# Patient Record
Sex: Female | Born: 1955 | Race: White | Hispanic: No | Smoking: Former smoker
Health system: Southern US, Community
[De-identification: ages and names within clinical notes are randomized; demographics above are authoritative.]

## PROBLEM LIST (undated history)

## (undated) DIAGNOSIS — J189 Pneumonia, unspecified organism: Secondary | ICD-10-CM

## (undated) DIAGNOSIS — K219 Gastro-esophageal reflux disease without esophagitis: Secondary | ICD-10-CM

## (undated) DIAGNOSIS — E782 Mixed hyperlipidemia: Secondary | ICD-10-CM

## (undated) DIAGNOSIS — I509 Heart failure, unspecified: Secondary | ICD-10-CM

## (undated) DIAGNOSIS — I10 Essential (primary) hypertension: Secondary | ICD-10-CM

## (undated) DIAGNOSIS — C801 Malignant (primary) neoplasm, unspecified: Secondary | ICD-10-CM

## (undated) DIAGNOSIS — I499 Cardiac arrhythmia, unspecified: Secondary | ICD-10-CM

## (undated) DIAGNOSIS — G709 Myoneural disorder, unspecified: Secondary | ICD-10-CM

## (undated) DIAGNOSIS — M1990 Unspecified osteoarthritis, unspecified site: Secondary | ICD-10-CM

## (undated) DIAGNOSIS — E119 Type 2 diabetes mellitus without complications: Secondary | ICD-10-CM

## (undated) DIAGNOSIS — F419 Anxiety disorder, unspecified: Secondary | ICD-10-CM

## (undated) HISTORY — PX: CHOLECYSTECTOMY: SHX55

## (undated) HISTORY — DX: Mixed hyperlipidemia: E78.2

## (undated) HISTORY — PX: SPLENECTOMY: SUR1306

## (undated) HISTORY — DX: Essential (primary) hypertension: I10

## (undated) HISTORY — DX: Gastro-esophageal reflux disease without esophagitis: K21.9

## (undated) HISTORY — DX: Malignant (primary) neoplasm, unspecified: C80.1

## (undated) HISTORY — PX: EYE SURGERY: SHX253

## (undated) HISTORY — PX: OTHER SURGICAL HISTORY: SHX169

---
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
## 1975-09-24 DIAGNOSIS — C801 Malignant (primary) neoplasm, unspecified: Secondary | ICD-10-CM

## 1975-09-24 HISTORY — DX: Malignant (primary) neoplasm, unspecified: C80.1

## 1975-09-24 HISTORY — PX: ABDOMINAL HYSTERECTOMY: SHX81

## 1997-10-28 ENCOUNTER — Ambulatory Visit (HOSPITAL_COMMUNITY): Admission: RE | Admit: 1997-10-28 | Discharge: 1997-10-28 | Payer: Self-pay | Admitting: Internal Medicine

## 1998-06-06 ENCOUNTER — Observation Stay (HOSPITAL_COMMUNITY): Admission: RE | Admit: 1998-06-06 | Discharge: 1998-06-07 | Payer: Self-pay | Admitting: Obstetrics and Gynecology

## 1998-06-19 ENCOUNTER — Encounter: Payer: Self-pay | Admitting: *Deleted

## 1998-06-20 ENCOUNTER — Inpatient Hospital Stay (HOSPITAL_COMMUNITY): Admission: EM | Admit: 1998-06-20 | Discharge: 1998-06-21 | Payer: Self-pay | Admitting: *Deleted

## 1998-08-31 ENCOUNTER — Other Ambulatory Visit: Admission: RE | Admit: 1998-08-31 | Discharge: 1998-08-31 | Payer: Self-pay | Admitting: Obstetrics and Gynecology

## 1999-06-21 ENCOUNTER — Encounter: Payer: Self-pay | Admitting: Gastroenterology

## 1999-06-21 ENCOUNTER — Ambulatory Visit (HOSPITAL_COMMUNITY): Admission: RE | Admit: 1999-06-21 | Discharge: 1999-06-21 | Payer: Self-pay | Admitting: Gastroenterology

## 1999-07-11 ENCOUNTER — Encounter: Payer: Self-pay | Admitting: Gastroenterology

## 1999-07-11 ENCOUNTER — Ambulatory Visit (HOSPITAL_COMMUNITY): Admission: RE | Admit: 1999-07-11 | Discharge: 1999-07-11 | Payer: Self-pay | Admitting: Gastroenterology

## 1999-07-19 ENCOUNTER — Ambulatory Visit (HOSPITAL_COMMUNITY): Admission: RE | Admit: 1999-07-19 | Discharge: 1999-07-19 | Payer: Self-pay | Admitting: Gastroenterology

## 1999-07-19 ENCOUNTER — Encounter: Payer: Self-pay | Admitting: Gastroenterology

## 1999-07-30 ENCOUNTER — Encounter: Payer: Self-pay | Admitting: Gastroenterology

## 1999-07-30 ENCOUNTER — Ambulatory Visit (HOSPITAL_COMMUNITY): Admission: RE | Admit: 1999-07-30 | Discharge: 1999-07-30 | Payer: Self-pay | Admitting: Gastroenterology

## 1999-08-13 ENCOUNTER — Encounter (INDEPENDENT_AMBULATORY_CARE_PROVIDER_SITE_OTHER): Payer: Self-pay

## 1999-08-13 ENCOUNTER — Ambulatory Visit (HOSPITAL_COMMUNITY): Admission: RE | Admit: 1999-08-13 | Discharge: 1999-08-13 | Payer: Self-pay | Admitting: Gastroenterology

## 2000-03-25 ENCOUNTER — Other Ambulatory Visit: Admission: RE | Admit: 2000-03-25 | Discharge: 2000-03-25 | Payer: Self-pay | Admitting: Obstetrics and Gynecology

## 2001-01-13 ENCOUNTER — Encounter: Admission: RE | Admit: 2001-01-13 | Discharge: 2001-04-13 | Payer: Self-pay | Admitting: Internal Medicine

## 2001-03-25 ENCOUNTER — Other Ambulatory Visit: Admission: RE | Admit: 2001-03-25 | Discharge: 2001-03-25 | Payer: Self-pay | Admitting: Obstetrics and Gynecology

## 2001-05-18 ENCOUNTER — Ambulatory Visit (HOSPITAL_COMMUNITY): Admission: RE | Admit: 2001-05-18 | Discharge: 2001-05-18 | Payer: Self-pay | Admitting: Internal Medicine

## 2001-05-18 ENCOUNTER — Encounter: Payer: Self-pay | Admitting: Internal Medicine

## 2001-08-04 ENCOUNTER — Encounter (INDEPENDENT_AMBULATORY_CARE_PROVIDER_SITE_OTHER): Payer: Self-pay

## 2001-08-04 ENCOUNTER — Observation Stay (HOSPITAL_COMMUNITY): Admission: RE | Admit: 2001-08-04 | Discharge: 2001-08-05 | Payer: Self-pay | Admitting: Obstetrics and Gynecology

## 2001-10-11 ENCOUNTER — Emergency Department (HOSPITAL_COMMUNITY): Admission: EM | Admit: 2001-10-11 | Discharge: 2001-10-11 | Payer: Self-pay | Admitting: Emergency Medicine

## 2002-07-14 ENCOUNTER — Encounter: Payer: Self-pay | Admitting: Internal Medicine

## 2002-07-14 ENCOUNTER — Ambulatory Visit (HOSPITAL_COMMUNITY): Admission: RE | Admit: 2002-07-14 | Discharge: 2002-07-14 | Payer: Self-pay | Admitting: Internal Medicine

## 2002-07-30 ENCOUNTER — Ambulatory Visit (HOSPITAL_COMMUNITY): Admission: RE | Admit: 2002-07-30 | Discharge: 2002-07-30 | Payer: Self-pay | Admitting: Internal Medicine

## 2002-07-30 ENCOUNTER — Encounter: Payer: Self-pay | Admitting: Internal Medicine

## 2002-12-26 ENCOUNTER — Ambulatory Visit (HOSPITAL_COMMUNITY): Admission: RE | Admit: 2002-12-26 | Discharge: 2002-12-26 | Payer: Self-pay | Admitting: Orthopaedic Surgery

## 2003-03-09 ENCOUNTER — Other Ambulatory Visit: Admission: RE | Admit: 2003-03-09 | Discharge: 2003-03-09 | Payer: Self-pay | Admitting: Obstetrics and Gynecology

## 2003-08-01 ENCOUNTER — Ambulatory Visit (HOSPITAL_COMMUNITY): Admission: RE | Admit: 2003-08-01 | Discharge: 2003-08-01 | Payer: Self-pay | Admitting: Internal Medicine

## 2003-10-06 ENCOUNTER — Inpatient Hospital Stay (HOSPITAL_COMMUNITY): Admission: EM | Admit: 2003-10-06 | Discharge: 2003-10-10 | Payer: Self-pay | Admitting: Emergency Medicine

## 2003-11-03 ENCOUNTER — Encounter: Admission: RE | Admit: 2003-11-03 | Discharge: 2004-02-01 | Payer: Self-pay | Admitting: Internal Medicine

## 2003-11-16 ENCOUNTER — Ambulatory Visit: Admission: RE | Admit: 2003-11-16 | Discharge: 2003-11-16 | Payer: Self-pay | Admitting: Internal Medicine

## 2004-02-28 ENCOUNTER — Encounter: Admission: RE | Admit: 2004-02-28 | Discharge: 2004-02-28 | Payer: Self-pay | Admitting: Gastroenterology

## 2004-09-07 ENCOUNTER — Ambulatory Visit (HOSPITAL_COMMUNITY): Admission: RE | Admit: 2004-09-07 | Discharge: 2004-09-07 | Payer: Self-pay | Admitting: Internal Medicine

## 2005-03-06 ENCOUNTER — Ambulatory Visit (HOSPITAL_COMMUNITY): Admission: RE | Admit: 2005-03-06 | Discharge: 2005-03-06 | Payer: Self-pay | Admitting: Orthopedic Surgery

## 2005-10-07 ENCOUNTER — Ambulatory Visit (HOSPITAL_COMMUNITY): Admission: RE | Admit: 2005-10-07 | Discharge: 2005-10-07 | Payer: Self-pay | Admitting: Internal Medicine

## 2005-11-15 ENCOUNTER — Ambulatory Visit (HOSPITAL_COMMUNITY): Admission: RE | Admit: 2005-11-15 | Discharge: 2005-11-15 | Payer: Self-pay | Admitting: Internal Medicine

## 2006-10-24 ENCOUNTER — Encounter: Admission: RE | Admit: 2006-10-24 | Discharge: 2006-10-24 | Payer: Self-pay | Admitting: Orthopedic Surgery

## 2007-04-30 ENCOUNTER — Ambulatory Visit (HOSPITAL_COMMUNITY): Admission: RE | Admit: 2007-04-30 | Discharge: 2007-04-30 | Payer: Self-pay | Admitting: Internal Medicine

## 2009-07-11 ENCOUNTER — Encounter: Admission: RE | Admit: 2009-07-11 | Discharge: 2009-07-11 | Payer: Self-pay | Admitting: Orthopedic Surgery

## 2010-05-31 ENCOUNTER — Emergency Department (HOSPITAL_COMMUNITY): Admission: EM | Admit: 2010-05-31 | Discharge: 2010-05-31 | Payer: Self-pay | Admitting: Emergency Medicine

## 2010-08-28 ENCOUNTER — Ambulatory Visit (HOSPITAL_COMMUNITY)
Admission: RE | Admit: 2010-08-28 | Discharge: 2010-08-28 | Payer: Self-pay | Source: Home / Self Care | Admitting: Internal Medicine

## 2010-09-06 ENCOUNTER — Encounter
Admission: RE | Admit: 2010-09-06 | Discharge: 2010-09-06 | Payer: Self-pay | Source: Home / Self Care | Attending: Internal Medicine | Admitting: Internal Medicine

## 2010-09-06 LAB — HM MAMMOGRAPHY: HM Mammogram: NEGATIVE

## 2010-10-14 ENCOUNTER — Encounter: Payer: Self-pay | Admitting: Gastroenterology

## 2010-10-15 ENCOUNTER — Encounter: Payer: Self-pay | Admitting: Internal Medicine

## 2010-12-06 LAB — DIFFERENTIAL
Basophils Absolute: 0.1 10*3/uL (ref 0.0–0.1)
Basophils Relative: 1 % (ref 0–1)
Eosinophils Absolute: 0.1 10*3/uL (ref 0.0–0.7)
Eosinophils Relative: 1 % (ref 0–5)
Lymphocytes Relative: 32 % (ref 12–46)
Lymphs Abs: 3 10*3/uL (ref 0.7–4.0)
Monocytes Absolute: 0.7 10*3/uL (ref 0.1–1.0)
Monocytes Relative: 7 % (ref 3–12)
Neutro Abs: 5.7 10*3/uL (ref 1.7–7.7)
Neutrophils Relative %: 60 % (ref 43–77)

## 2010-12-06 LAB — COMPREHENSIVE METABOLIC PANEL
ALT: 25 U/L (ref 0–35)
AST: 23 U/L (ref 0–37)
Albumin: 3.8 g/dL (ref 3.5–5.2)
Alkaline Phosphatase: 100 U/L (ref 39–117)
BUN: 13 mg/dL (ref 6–23)
CO2: 26 mEq/L (ref 19–32)
Calcium: 9.9 mg/dL (ref 8.4–10.5)
Chloride: 104 mEq/L (ref 96–112)
Creatinine, Ser: 0.57 mg/dL (ref 0.4–1.2)
GFR calc Af Amer: >60 mL/min (ref >60)
GFR calc non Af Amer: >60 mL/min (ref >60)
Glucose, Bld: 150 mg/dL — ABNORMAL HIGH (ref 70–99)
Potassium: 3.5 mEq/L (ref 3.5–5.1)
Sodium: 140 mEq/L (ref 135–145)
Total Bilirubin: 0.4 mg/dL (ref 0.3–1.2)
Total Protein: 6.8 g/dL (ref 6.0–8.3)

## 2010-12-06 LAB — URINALYSIS, ROUTINE W REFLEX MICROSCOPIC
Bilirubin Urine: NEGATIVE
Glucose, UA: NEGATIVE mg/dL
Hgb urine dipstick: NEGATIVE
Ketones, ur: NEGATIVE mg/dL
Nitrite: NEGATIVE
Protein, ur: NEGATIVE mg/dL
Specific Gravity, Urine: 1.009 (ref 1.005–1.030)
Urobilinogen, UA: 0.2 mg/dL (ref 0.0–1.0)
pH: 7.5 (ref 5.0–8.0)

## 2010-12-06 LAB — CBC
HCT: 41.6 % (ref 36.0–46.0)
Hemoglobin: 14 g/dL (ref 12.0–15.0)
MCH: 28.3 pg (ref 26.0–34.0)
MCHC: 33.7 g/dL (ref 30.0–36.0)
MCV: 84.2 fL (ref 78.0–100.0)
Platelets: 472 10*3/uL — ABNORMAL HIGH (ref 150–400)
RBC: 4.94 MIL/uL (ref 3.87–5.11)
RDW: 14.1 % (ref 11.5–15.5)
WBC: 9.6 10*3/uL (ref 4.0–10.5)

## 2010-12-06 LAB — PROTIME-INR
INR: 0.98 (ref 0.00–1.49)
Prothrombin Time: 13.2 seconds (ref 11.6–15.2)

## 2011-02-08 NOTE — Op Note (Signed)
[facility]  Patient:    [name], [name] Visit Number: [id number] MRN: [id number]          Service Type: DSU Location: [id number] Attending Physician:  Jenean Lindau Dictated by:   Laqueta Linden, M.D. Admit Date:  08/04/2001   CC:         Santiago Bumpers, M.D.   Operative Report  PREOPERATIVE DIAGNOSES:       1. Left ovarian cyst.                               2. Dyspareunia.                               3. History of endometriosis in the past.  POSTOPERATIVE DIAGNOSES:      1. Left ovarian cyst.                               2. Dyspareunia.                               3. History of endometriosis in the past.  PROCEDURE:                    Laparoscopic left salpingo-oophorectomy with lysis of adhesions.  SURGEON:                      Laqueta Linden, M.D.  ASSISTANT:                    Edwena Felty. Ashley Royalty, M.D.  ANESTHESIA:                   General endotracheal.  ESTIMATED BLOOD LOSS:         Less than 10 cc.  COUNTS:                       Correct x 2.  COMPLICATIONS:                None.  INDICATIONS:                  [name] is a 55 year old menopausal female who has undergone a vaginal hysterectomy and right salpingo-oophorectomy in the past who presented with recurrent dyspareunia and a left ovarian cyst on ultrasound.  She had a prior history of identical symptoms prior to her right salpingo-oophorectomy and was noted to have an endometriotic implant and an adhesed right ovary to the vaginal cuff at the time of that surgery in 1999.  She had remained pain free until the past six months when she had recurrent dyspareunia with identical symptoms.  She was noted on pelvic ultrasound to have a 1.5 cm echo free cyst of the left ovary with an area of slight increased echogenicity of unclear etiology.  It was unclear whether this ovary was adherent to the vaginal cuff or not.  The patient has seen the informed consent  film and understands that she may require laparotomy due to her multiple prior surgeries and the risk of adhesions.  Has undergone a mechanical bowel prep and voices her understanding that there might be some hormonal changes with removal of her remaining ovary requiring  adjustment in her menopausal estrogen.  She has been extensively counseled as to the risks, benefits, alternatives, and complications, particularly with respect to her multiple prior surgeries and agrees to proceed.  She will be hospitalized for overnight observation postoperatively for pain and nausea management per her request.  PROCEDURE:                    The patient was taken to the operating room and after proper identification and consents were ascertained, she was placed on the operating table in a supine position.  After the induction of general endotracheal anesthesia she was placed in the [place] stirrups and the abdomen, perineum, and vagina were prepped and draped in a routine sterile fashion.  A transurethral Foley was placed which was removed at the conclusion of the procedure.  A sponge stick was placed in the vagina for manipulation of the vaginal cuff.  Attention was then turned abdominally.  A 2 cm infraumbilical incision was made.  The curved Kelly clamps were then used to free up the underlying scar tissue and identify the fascia which was sharply incised.  The peritoneum was then identified and elevated with sharp entry into the peritoneal cavity.  Palpation at the insertion site revealed no adhesions or abnormalities at the insertion site.  To the left of the peritoneal opening there was noted to be a thick band of omental adhesions.  The Hasson cannula was then placed after sutures were placed in the fascia to hold it in place. Pneumoperitoneum was established.  The patient was placed in Trendelenburg. Then 5 mm ports were placed in both lower quadrants under direct vision. Inspection of the upper  abdomen was limited due to adhesions.  The gallbladder was surgically absent.  There was a thick band of omental adhesions in the left upper and mid quadrant which were not disturbed.  There were additional omental adhesions to the anterior peritoneal wall in the left lower quadrant and into the pelvis which created an apron of omentum which made it impossible to see into the pelvis.  The tripolar cautery was then used to cauterize and transect these adhesions with marked improvement of visibility.  There was no bowel contained within these adhesions and visibility was good during that dissection.  At this point the bowel was mobilized in the upper abdomen such that the pelvis could be better visualized.  The vaginal cuff was elevated and free of any lesions, nodules, or evidence of endometriosis.  The cul-de-sac was cleaned.  The right tube and ovary were surgically absent.  The left tube was fairly mobile as was the ovary which had a 2 cm clear simple appearing cyst.  There were some filmy peritoneal and omental adhesions just at the level of the infundibulopelvic ligament which were gently pulled away with improved mobilization.  The tripolar cautery was used to cauterize and transect the round ligament of the left with marked improvement in mobility of that adnexa.  The ureter was visualized deep in the pelvis and well out of the operative field.  Vicryl 3-0 endo loops were placed across the infundibulopelvic ligament on the left.  The left adnexa was then transected from this.  Hemostasis was excellent.  The tube and ovary were then cut into smaller pieces as necessary to be removed through the primary trocar site and were sent to pathology for final sectioning.  Copious lavage was accomplished. The filmy left lower quadrant adhesions were lysed with freeing up of the adherent  bowel and omentum to the anterior abdominal wall in the left lower quadrant.  Additional dissection of the  thicker adhesions filling the left mid  and upper quadrant was not attempted due to an inability to visualize the extent of the adhesions as well as the patients history of prior extensive surgery with splenectomy and pancreatectomy.  At this point the procedure was terminated.  The 5 mm ports were removed with no active bleeding.  The pneumoperitoneum was allowed to escape.  Inspection as the scope was withdrawn revealed no obvious injury or bleeding at the insertion site.  The fascia at the insertion site was closed with 0 Vicryl sutures.  All skin incisions were then closed with subcuticular stitch of 4-0 Vicryl.  Steri-Strips and pressure dressings were applied.  The Foley catheter and vaginal sponge were then removed.  The patient was awakened and extubated on transfer to the recovery room.  Per her request she will be placed on overnight observation with anticipated discharge in the morning. Dictated by:   Laqueta Linden, M.D. Attending Physician:  Jenean Lindau DD:  08/04/01 TD:  08/04/01 Job: [id number]

## 2011-02-08 NOTE — H&P (Signed)
[name], [name]                      ACCOUNT NO.:  [id number]   MEDICAL RECORD NO.:  [id number]                   PATIENT TYPE:  INP   LOCATION:  1824                                 FACILITY:  [facility]   PHYSICIAN:  Vania Rea, M.D.              DATE OF BIRTH:  1956-08-01   DATE OF ADMISSION:  10/06/2003  DATE OF DISCHARGE:                                HISTORY & PHYSICAL   PRIMARY CARE PHYSICIAN:  Dr. Karleen Hampshire.   CHIEF COMPLAINT:  Dizziness, difficulty seeing, weakness, and elevated blood  sugar.   HISTORY OF PRESENT ILLNESS:  This is a 55 year old Caucasian lady, status  post partial pancreatectomy with splenectomy in 2000, as a result of  recurrent chronic pancreatitis, who was in fairly good health until about  two weeks ago when she developed a cough productive of yellow-green bloody  sputum associated with nausea, vomiting, and diarrhea and has been treated  by her primary care physician, first with five days of Augmentin and cough  medicine, which caused some improvement.  It was later changed to [facility] and  another cough medicine, and later was apparently treated for bronchitis with  prednisone and inhalers.  The patient has a history of using inhalers in the  past.  Patient has spent most of the past two weeks in bed.  Yesterday, the  patient felt well enough that she thought she would return to work, but when  she got up to walk, she felt weak.  Her legs felt wobbly and dizzy, and she  had difficulty seeing.  She went to her primary care physician, who did  blood tests and found her to have very elevated blood sugar and sent her in  for admission.   Patient notes that a day or so after the onset of her acute upper  respiratory illness two weeks ago, she started having excessive thirst and  passing excessive amounts of urine.  The patient denies fevers, chest pain,  palpitations, or syncope.  For the past two weeks, she has noted that she  has been  having orthopnea and PND, which are new.  She has recurrent  episodes of leg edema, for which she says she has been treated with  furosemide 40 mg twice daily.  She gives no history of a cardiac evaluation.   PAST MEDICAL HISTORY:  1. History of chronic pancreatitis, as noted above, now resolved.  2. GERD.  3. Remote of CA of the cervix.   PAST SURGICAL HISTORY:  1. Status post partial pancreatectomy.  2. Status post splenectomy in 2000.  3. Status post hysterectomy at age 60 for CA of the cervix.   MEDICATIONS:  Within the past couple of weeks, she has been taking  Augmentin, Histinex, Ketek, and more recently, prednisone 3 times daily,  dose unknown.  Chronically, she uses Lipitor 40 mg, aspirin 81 mg, Cenestin  0.9 mg, and furosemide 40 mg twice daily.  ALLERGIES:  As a child, penicillin gave her hives, but she recently took  Augmentin for 5 days without any obvious difficulty.   SOCIAL HISTORY:  She smoked 1-1/2 packs of cigarettes per day between ages  88 and 17.  Used to be an occasional drinker of alcohol but discontinued  after episodes of recurrent pancreatitis.  Very remote history of marijuana  use.   Both her mother and father had CABG twice.  Mother also has hypertension.  Father has diabetes and glaucoma.  She works as a [employer] operational officer.  Has two  children.  Son died at the age of 55 as a result of an accident.  Her  daughter is now 12 and has no significant medical problems.   REVIEW OF SYSTEMS:  Otherwise noncontributory.  Specifically, no history  suggestive of sinusitis.  No history suggestive of UTI.  No history  suggestive of ear infections.   PHYSICAL EXAMINATION:  VITAL SIGNS:  Temperature 98.4, pulse 76, respiration  20, blood pressure 137/89.  GENERAL:  This is a pleasant, middle-aged lady lying on the stretcher in no  obvious distress.  HEENT:  She is pink, anicteric.  Pupils are equal and reactive.  NECK:  No lymphadenopathy.  LUNGS:  Clear to  auscultation bilaterally.  HEART:  Regular rhythm.  No murmurs, rubs or gallops.  ABDOMEN:  Obese.  Soft, nontender.  There is a midline scar from her  surgeries.  EXTREMITIES:  Trace edema, 3+ pulses.  NEUROLOGIC:  Alert and oriented x 3.  No focal deficits.   LABS:  Hematocrit is 46, hemoglobin 15.6.  Sodium 130, potassium 4, chloride  100, BUN 24, creatinine 0.7, pH 7.44, pCO2 35.  Cardiac enzymes are  completely normal, so far.  Urinalysis shows specific gravity of 1.036, pH  6.5, glucose over 1000, ketones 15, protein none.  Nitrites and leukocyte  esterase none.  Her amylase and lipase are well within normal limits.   Chest x-ray is reported as normal.   ASSESSMENT:  This is a lady status post partial pancreatectomy with what  sounds like an acute, probably bronchitis or sinusitis, who was tipped over  into an overt manifestation of diabetes.  She is also quite dehydrated.   PLAN:  Hydrate her.  Start her on an insulin regimen, Novolin 70/30 with  sliding scale coverage.  Get her some diabetic education.  If she becomes  febrile, we will culture her, but at the moment, she seems to be, whatever  infection she had is partially treated, and she is showing no evidence of  infection.  She actually came with some old labs which showed a white count  of 17,000.  We will repeat her CBC and see what this is now.  We will give  her Levaquin empirically.                                                Vania Rea, M.D.    LC/MEDQ  D:  10/06/2003  T:  10/06/2003  Job:  [id number]

## 2011-02-08 NOTE — Discharge Summary (Signed)
[name], [name]                      ACCOUNT NO.:  [id number]   MEDICAL RECORD NO.:  [id number]                   PATIENT TYPE:  INP   LOCATION:  5506                                 FACILITY:  [facility]   PHYSICIAN:  Vania Rea, M.D.              DATE OF BIRTH:  Aug 15, 1956   DATE OF ADMISSION:  10/06/2003  DATE OF DISCHARGE:  10/10/2003                                 DISCHARGE SUMMARY   PRIMARY CARE PHYSICIAN:  Dr. Lucky Cowboy.   FINAL DIAGNOSES:  1. Diabetes mellitus, type 2.  2. Anxiety disorder.  3. History of alcohol abuse secondary to death of a child.  4. Pancreatic pseudocyst.  5. Status post partial pancreatectomy and splenectomy in 2000 at [place]     [facility] [place].  6. Status post partial hysterectomy for cervical carcinoma.   FINAL PROCEDURES:  Chest x-ray performed October 06, 2003 showing no active  disease.   <PERTINENT LABORATORIES AND OTHER TEST RESULTS/>  White blood cells 13.3, hemoglobin and hematocrit 12.4/37.7 with a platelet  count of 309,000.  Sodium 138, potassium 4.3, chloride 104, carbon dioxide  28, BUN 12, creatinine 0.7, glucose 157, total bilirubin 0.6, direct  bilirubin of less than 0.1, alkaline phosphatase 176, AST 18, ALT is 23,  total protein 6.1, albumin 3.3, calcium 8.5, magnesium 1.9, amylase 48.  Hemoglobin A1c 11.  Lipase 26.  Cardiac enzymes negative.  Total cholesterol  184, triglycerides 291, HDL 41, LDL 85, VLDL 58.  TSH 1.596.  Urinary  creatinine 78.1, spot urine protein 12, microalbumin 0.58, pancreatic islet  cell antibodies less than 1:4.   SUMMARY OF HOSPITAL COURSE BY SYSTEMS:  The patient is a pleasant 55-year-  old white female with past medical history as above, who was admitted  through the emergency room when she had several days' worth of nausea,  vomiting, generalized weakness and was also found to have a blood sugar of  greater than 600.   PROBLEM #1 - NEUROPSYCHIATRIC:  There were no signs  or symptoms of  meningitis, encephalitis or CVA.  There were no in-hospital episodes of  acute mania or psychosis, however, the patient did appear to be markedly  anxious regarding her new diagnosis, frequently asking to stay longer so  that she could get adjusted to her new condition.  The patient, at night,  would ask for medications to help her calm her nerves, at times  particularly asking for benzodiazepines.  This was give on a x1 basis only  and the patient was started buspirone, which she appeared to be tolerating  by the time of discharge.  The patient was frequently reassured by the  nursing staff as well as by physicians as to the chronicity versus acuity of  her condition and the excellent outcome if lifestyle modifications and  medical compliance were adhered to.   PROBLEM #2 - PULMONARY:  No active issues.   PROBLEM #3 - CARDIOVASCULAR:  The patient was continued on her aspirin and  atorvastatin.  She was to be started on low-dose ACE inhibitor, however, the  patient did have rather low blood pressures while in house in the absence of  any blood pressure medications, reaching a low of 98/63, and for this  reason, ACE inhibitors were not started as an inpatient.   PROBLEM #4 - RENAL:  The patient's urine protein/creatinine ratio was  calculated at 0.15, showing a very minimal proteinuria with an elevated  microalbumin, however, for the reasons listed above, she was not started on  an ACE inhibitor.   PROBLEM #5 - GASTROINTESTINAL:  There were no active issues.   PROBLEM #6 - FLUIDS, ELECTROLYTES, AND NUTRITION:  The patient was fluid-  resuscitated in regards to the osmotic diuresis from prolonged hyperglycemia  and she tolerated this quite well.  Her electrolytes were kept within normal  limits and she was started on a 2200-kilocalorie ADA diet while in house.   PROBLEM #7 - INFECTIOUS DISEASE:  The patient did notice to have a white  count and she had been on a  prolonged course of antibiotics for presumed  community-acquired pneumonia.  This was continued with in-house  moxifloxacin, which she will continue on a 4-day schedule for another 4  days.  There were no febrile episodes while the patient was admitted.   PROBLEM #8 - HEMATOLOGY/ONCOLOGY:  No active issues.   PROBLEM #9 - ENDOCRINE:  TSH was normal.  The patient is receiving the new  diagnosis of type 2 diabetes, although in review of old records from [facility],  the patient had stated that she was borderline diabetic approximately 3  years ago.  Likely, this is contributed in part to by her partial  pancreatectomy.  She was started on metformin and glipizide XL, which she  tolerated quite well.  She received NPH while in house and sliding-scale  insulin and was taught and reinforced many times on how to check her blood  sugars as well as administer subcutaneous injections of insulin.  This is a  new diagnosis and the patient was uncomfortable starting insulin.  Lifestyle  modifications such as diet and exercise were stressed, as well as compliance  with her medical regimen, and she will attempt to lose weight, watch her  diet and take her medications in an attempt to avoid insulin therapy.   PROBLEM #10 - PROPHYLAXIS:  The patient was full p.o. for GI prophylaxis and  was ambulatory for DVT prophylaxis.   PROBLEM #11 - DISPOSITION:  The patient is being discharged home in much  improved condition.   DISCHARGE MEDICATIONS:  1. Aspirin 81 mg p.o. daily.  2. Atorvastatin 40 mg p.o. daily.  3. Buspirone 7.5 mg p.o. b.i.d. (new medication).  4. Glipizide XL 5 mg p.o. daily (new medication).  5. Metformin 1000 mg p.o. b.i.d. (new medication).  6. Moxifloxacin 400 mg p.o. daily x4 days (new medication).   DISCHARGE INSTRUCTIONS:  1. The patient is to take her medication as prescribed.  2. She is to have a low-fat, low-salt, low-concentrated-sweet-and-sugar     diet. 3. She is to engage in a  regular daily low-impact aerobic activity.  4. She is to follow up with Dr. Oneta Rack in 2 to 4 weeks.  5. She is to return if she feels worse.      Ara D. Tammi Klippel, M.D.  Vania Rea, M.D.    ADM/MEDQ  D:  10/10/2003  T:  10/11/2003  Job:  [id number]

## 2011-12-07 ENCOUNTER — Inpatient Hospital Stay (HOSPITAL_COMMUNITY)
Admission: EM | Admit: 2011-12-07 | Discharge: 2011-12-10 | DRG: 294 | Disposition: A | Discharge status: Home / Self Care | Payer: BC Managed Care – PPO | Attending: Internal Medicine | Admitting: Internal Medicine

## 2011-12-07 ENCOUNTER — Inpatient Hospital Stay (HOSPITAL_COMMUNITY): Payer: BC Managed Care – PPO

## 2011-12-07 ENCOUNTER — Other Ambulatory Visit: Payer: Self-pay

## 2011-12-07 ENCOUNTER — Encounter (HOSPITAL_COMMUNITY): Payer: Self-pay

## 2011-12-07 DIAGNOSIS — M109 Gout, unspecified: Secondary | ICD-10-CM | POA: Diagnosis present

## 2011-12-07 DIAGNOSIS — E876 Hypokalemia: Secondary | ICD-10-CM | POA: Diagnosis present

## 2011-12-07 DIAGNOSIS — E1110 Type 2 diabetes mellitus with ketoacidosis without coma: Secondary | ICD-10-CM

## 2011-12-07 DIAGNOSIS — R112 Nausea with vomiting, unspecified: Secondary | ICD-10-CM | POA: Diagnosis present

## 2011-12-07 DIAGNOSIS — K219 Gastro-esophageal reflux disease without esophagitis: Secondary | ICD-10-CM | POA: Diagnosis present

## 2011-12-07 DIAGNOSIS — Z9081 Acquired absence of spleen: Secondary | ICD-10-CM

## 2011-12-07 DIAGNOSIS — Z7982 Long term (current) use of aspirin: Secondary | ICD-10-CM

## 2011-12-07 DIAGNOSIS — E1010 Type 1 diabetes mellitus with ketoacidosis without coma: Principal | ICD-10-CM | POA: Diagnosis present

## 2011-12-07 DIAGNOSIS — Z794 Long term (current) use of insulin: Secondary | ICD-10-CM

## 2011-12-07 LAB — CBC
HCT: 49.1 % — ABNORMAL HIGH (ref 36.0–46.0)
HCT: 46.5 % — ABNORMAL HIGH (ref 36.0–46.0)
Hemoglobin: 16.8 g/dL — ABNORMAL HIGH (ref 12.0–15.0)
Hemoglobin: 16.7 g/dL — ABNORMAL HIGH (ref 12.0–15.0)
MCH: 27.7 pg (ref 26.0–34.0)
MCHC: 35.9 g/dL (ref 30.0–36.0)
MCV: 78.6 fL (ref 78.0–100.0)
MCV: 77.1 fL — ABNORMAL LOW (ref 78.0–100.0)
RDW: 15.3 % (ref 11.5–15.5)
WBC: 13.2 10*3/uL — ABNORMAL HIGH (ref 4.0–10.5)

## 2011-12-07 LAB — BASIC METABOLIC PANEL
BUN: 29 mg/dL — ABNORMAL HIGH (ref 6–23)
BUN: 26 mg/dL — ABNORMAL HIGH (ref 6–23)
BUN: 25 mg/dL — ABNORMAL HIGH (ref 6–23)
CO2: 11 mEq/L — ABNORMAL LOW (ref 19–32)
CO2: 18 mEq/L — ABNORMAL LOW (ref 19–32)
CO2: 20 mEq/L (ref 19–32)
Calcium: 10.1 mg/dL (ref 8.4–10.5)
Calcium: 9.4 mg/dL (ref 8.4–10.5)
Chloride: 95 mEq/L — ABNORMAL LOW (ref 96–112)
Chloride: 99 mEq/L (ref 96–112)
Chloride: 103 mEq/L (ref 96–112)
Creatinine, Ser: 0.6 mg/dL (ref 0.50–1.10)
Creatinine, Ser: 0.51 mg/dL (ref 0.50–1.10)
Creatinine, Ser: 0.52 mg/dL (ref 0.50–1.10)
Glucose, Bld: 563 mg/dL (ref 70–99)
Glucose, Bld: 341 mg/dL — ABNORMAL HIGH (ref 70–99)
Glucose, Bld: 215 mg/dL — ABNORMAL HIGH (ref 70–99)
Glucose, Bld: 133 mg/dL — ABNORMAL HIGH (ref 70–99)
Potassium: 3.3 mEq/L — ABNORMAL LOW (ref 3.5–5.1)

## 2011-12-07 LAB — URINALYSIS, ROUTINE W REFLEX MICROSCOPIC
Bilirubin Urine: NEGATIVE
Glucose, UA: >1000 mg/dL — AB
Ketones, ur: >80 mg/dL — AB
Leukocytes, UA: NEGATIVE
Protein, ur: 100 mg/dL — AB
pH: 6 (ref 5.0–8.0)

## 2011-12-07 LAB — URINE MICROSCOPIC-ADD ON

## 2011-12-07 LAB — DIFFERENTIAL
Eosinophils Relative: 0 % (ref 0–5)
Lymphocytes Relative: 18 % (ref 12–46)
Monocytes Absolute: 0.9 10*3/uL (ref 0.1–1.0)
Monocytes Relative: 7 % (ref 3–12)
Neutro Abs: 10 10*3/uL — ABNORMAL HIGH (ref 1.7–7.7)

## 2011-12-07 LAB — GLUCOSE, CAPILLARY
Glucose-Capillary: 460 mg/dL — ABNORMAL HIGH (ref 70–99)
Glucose-Capillary: 149 mg/dL — ABNORMAL HIGH (ref 70–99)
Glucose-Capillary: 134 mg/dL — ABNORMAL HIGH (ref 70–99)
Glucose-Capillary: 136 mg/dL — ABNORMAL HIGH (ref 70–99)

## 2011-12-07 LAB — POCT I-STAT 3, VENOUS BLOOD GAS (G3P V)
O2 Saturation: 27 %
pCO2, Ven: 32 mmHg — ABNORMAL LOW (ref 45.0–50.0)

## 2011-12-07 MED ORDER — SODIUM CHLORIDE 0.9 % IV SOLN: INTRAVENOUS | Status: DC

## 2011-12-07 MED ORDER — MORPHINE SULFATE 4 MG/ML IJ SOLN
4.0000 mg | Freq: Once | INTRAMUSCULAR | Status: AC
  Administered 2011-12-07: 4 mg via INTRAVENOUS
  Filled 2011-12-07: qty 1

## 2011-12-07 MED ORDER — DEXTROSE 50 % IV SOLN: 25.0000 mL | INTRAVENOUS | Status: DC | PRN

## 2011-12-07 MED ORDER — SODIUM CHLORIDE 0.9 % IV SOLN
INTRAVENOUS | Status: DC
  Administered 2011-12-07: 18:00:00 via INTRAVENOUS

## 2011-12-07 MED ORDER — ONDANSETRON HCL 4 MG/2ML IJ SOLN
4.0000 mg | Freq: Once | INTRAMUSCULAR | Status: AC
  Administered 2011-12-07: 4 mg via INTRAVENOUS
  Filled 2011-12-07: qty 2

## 2011-12-07 MED ORDER — DEXTROSE-NACL 5-0.45 % IV SOLN
INTRAVENOUS | Status: DC
  Administered 2011-12-07: 18:00:00 via INTRAVENOUS

## 2011-12-07 MED ORDER — SODIUM CHLORIDE 0.9 % IV SOLN
INTRAVENOUS | Status: DC
  Administered 2011-12-07: 5 [IU]/h via INTRAVENOUS
  Filled 2011-12-07: qty 1

## 2011-12-07 MED ORDER — ENOXAPARIN SODIUM 30 MG/0.3ML ~~LOC~~ SOLN
30.0000 mg | SUBCUTANEOUS | Status: DC
  Administered 2011-12-07: 30 mg via SUBCUTANEOUS
  Filled 2011-12-07 (×2): qty 0.3

## 2011-12-07 MED ORDER — ACETAMINOPHEN 325 MG PO TABS: 650.0000 mg | ORAL_TABLET | Freq: Four times a day (QID) | ORAL | Status: DC | PRN

## 2011-12-07 MED ORDER — ONDANSETRON HCL 4 MG PO TABS: 4.0000 mg | ORAL_TABLET | Freq: Four times a day (QID) | ORAL | Status: DC | PRN

## 2011-12-07 MED ORDER — SODIUM CHLORIDE 0.9 % IV BOLUS (SEPSIS)
500.0000 mL | Freq: Once | INTRAVENOUS | Status: AC
  Administered 2011-12-07 (×2): via INTRAVENOUS

## 2011-12-07 MED ORDER — INSULIN REGULAR BOLUS VIA INFUSION
0.0000 [IU] | Freq: Three times a day (TID) | INTRAVENOUS | Status: DC
  Administered 2011-12-07: 5.5 [IU] via INTRAVENOUS
  Filled 2011-12-07: qty 10

## 2011-12-07 MED ORDER — ACETAMINOPHEN 650 MG RE SUPP: 650.0000 mg | Freq: Four times a day (QID) | RECTAL | Status: DC | PRN

## 2011-12-07 MED ORDER — ONDANSETRON HCL 4 MG/2ML IJ SOLN
4.0000 mg | Freq: Four times a day (QID) | INTRAMUSCULAR | Status: DC | PRN
  Administered 2011-12-08 (×2): 4 mg via INTRAVENOUS
  Filled 2011-12-07 (×2): qty 2

## 2011-12-07 MED ORDER — SODIUM CHLORIDE 0.9 % IV SOLN
INTRAVENOUS | Status: DC
  Administered 2011-12-07: 18:00:00 via INTRAVENOUS
  Administered 2011-12-07: 8 [IU]/h via INTRAVENOUS
  Administered 2011-12-08: 1.7 [IU]/h via INTRAVENOUS
  Filled 2011-12-07: qty 1

## 2011-12-07 MED ORDER — HYDROMORPHONE HCL PF 1 MG/ML IJ SOLN
0.5000 mg | INTRAMUSCULAR | Status: DC | PRN
  Administered 2011-12-07 – 2011-12-08 (×4): 0.5 mg via INTRAVENOUS
  Filled 2011-12-07 (×5): qty 1

## 2011-12-07 MED ORDER — INSULIN GLARGINE 100 UNIT/ML ~~LOC~~ SOLN
10.0000 [IU] | Freq: Every day | SUBCUTANEOUS | Status: DC
  Administered 2011-12-07 – 2011-12-08 (×2): 10 [IU] via SUBCUTANEOUS

## 2011-12-07 MED ORDER — INSULIN GLARGINE 100 UNIT/ML ~~LOC~~ SOLN: 10.0000 [IU] | Freq: Every day | SUBCUTANEOUS | Status: DC

## 2011-12-07 NOTE — ED Notes (Signed)
Patient denies pain and is resting comfortably.  

## 2011-12-07 NOTE — ED Notes (Signed)
Family at bedside. 

## 2011-12-07 NOTE — H&P (Addendum)
PCP:   Nadean Corwin, MD, MD   Chief Complaint:  Nausea, vomiting, weakness  HPI: [name] is a 56 year old female, history of diabetes on insulin initially presented to her primary doctor about 10-14 days ago with nausea and vomiting they originally thought that she had a norovirus infection, her symptoms persisted and she stopped taking her insulin about 2 weeks ago. Since then she has had persistent nausea and vomiting associated with lower back and stomach ache, and chills without any fevers. She denies any diarrhea, cough congestion upper respiratory symptoms or changes in urinary habits as well. On Thursday she went to Dr. Kathryne Sharper office was given an antibiotic shot along with Phenergan. In presents to the ER today where upon evaluation she was found to have diabetic ketoacidosis.  Allergies:  Not on File    Past Medical History  Diagnosis Date  . Diabetes mellitus   . Gout   . Acid reflux     Past Surgical History  Procedure Date  . Pancreatectomy   . Splenectomy     Prior to Admission medications   Medication Sig Start Date End Date Taking? Authorizing Provider  allopurinol (ZYLOPRIM) 100 MG tablet Take 100 mg by mouth daily.   Yes Historical Provider, MD  aspirin EC 81 MG tablet Take 81 mg by mouth daily.   Yes Historical Provider, MD  insulin NPH (HUMULIN N,NOVOLIN N) 100 UNIT/ML injection Inject 10-35 Units into the skin daily. 35 in the a.m. 10 at night   Yes Historical Provider, MD  MELOXICAM PO Take 1 tablet by mouth at bedtime.   Yes Historical Provider, MD  omeprazole (PRILOSEC) 20 MG capsule Take 20 mg by mouth daily.   Yes Historical Provider, MD    Social History:  reports that she has quit smoking. She does not have any smokeless tobacco history on file. She reports that she does not drink alcohol or use illicit drugs.  Family History  Problem Relation Age of Onset  . Dementia Mother   . Heart disease Father     Review of Systems:  Positives bolded Constitutional: Denies fever, chills, diaphoresis, appetite change and fatigue.  HEENT: Denies photophobia, eye pain, redness, hearing loss, ear pain, congestion, sore throat, rhinorrhea, sneezing, mouth sores, trouble swallowing, neck pain, neck stiffness and tinnitus.   Respiratory: Denies SOB, DOE, cough, chest tightness,  and wheezing.   Cardiovascular: Denies chest pain, palpitations and leg swelling.  Gastrointestinal: Denies nausea, vomiting, abdominal pain, diarrhea, constipation, blood in stool and abdominal distention.  Genitourinary: Denies dysuria, urgency, frequency, hematuria, flank pain and difficulty urinating.  Musculoskeletal: Denies myalgias, back pain, joint swelling, arthralgias and gait problem.  Skin: Denies pallor, rash and wound.  Neurological: Denies dizziness, seizures, syncope, weakness, light-headedness, numbness and headaches.  Hematological: Denies adenopathy. Easy bruising, personal or family bleeding history  Psychiatric/Behavioral: Denies suicidal ideation, mood changes, confusion, nervousness, sleep disturbance and agitation   Physical Exam: Blood pressure 132/75, pulse 91, temperature 98.4 F (36.9 C), temperature source Oral, resp. rate 16, SpO2 97.00%. Averagely built white female, laying in the stretcher, ill appearing HEENT: Pupils 4 mm reactive to light oral mucosa is dry neck no JVD or lymphadenopathy extensive the assessment is regular rate rhythm no murmurs rubs or gallops Lungs clear to auscultation might decrease the bases Abdomen soft nontender normal bowel sounds no organomegaly no flank tenderness Extremities no edema clubbing or cyanosis  skin no abnormalities noted  Neuro : Moves all extremities no localizing signs  Labs on Admission:  Results for orders placed during the hospital encounter of 12/07/11 (from the past 48 hour(s))  CBC     Status: Abnormal   Collection Time   12/07/11 10:19 AM      Component Value Range  Comment   WBC 13.2 (*) 4.0 - 10.5 (K/uL)    RBC 6.25 (*) 3.87 - 5.11 (MIL/uL)    Hemoglobin 16.8 (*) 12.0 - 15.0 (g/dL)    HCT 14.7 (*) 82.9 - 46.0 (%)    MCV 78.6  78.0 - 100.0 (fL)    MCH 26.9  26.0 - 34.0 (pg)    MCHC 34.2  30.0 - 36.0 (g/dL)    RDW 56.2  13.0 - 86.5 (%)    Platelets 168  150 - 400 (K/uL)   DIFFERENTIAL     Status: Abnormal   Collection Time   12/07/11 10:19 AM      Component Value Range Comment   Neutrophils Relative 76  43 - 77 (%)    Neutro Abs 10.0 (*) 1.7 - 7.7 (K/uL)    Lymphocytes Relative 18  12 - 46 (%)    Lymphs Abs 2.3  0.7 - 4.0 (K/uL)    Monocytes Relative 7  3 - 12 (%)    Monocytes Absolute 0.9  0.1 - 1.0 (K/uL)    Eosinophils Relative 0  0 - 5 (%)    Eosinophils Absolute 0.0  0.0 - 0.7 (K/uL)    Basophils Relative 0  0 - 1 (%)    Basophils Absolute 0.0  0.0 - 0.1 (K/uL)   BASIC METABOLIC PANEL     Status: Abnormal   Collection Time   12/07/11 10:19 AM      Component Value Range Comment   Sodium 133 (*) 135 - 145 (mEq/L)    Potassium 3.6  3.5 - 5.1 (mEq/L)    Chloride 95 (*) 96 - 112 (mEq/L)    CO2 11 (*) 19 - 32 (mEq/L)    Glucose, Bld 563 (*) 70 - 99 (mg/dL)    BUN 29 (*) 6 - 23 (mg/dL)    Creatinine, Ser 7.84  0.50 - 1.10 (mg/dL)    Calcium 69.6  8.4 - 10.5 (mg/dL)    GFR calc non Af Amer >90  >90 (mL/min)    GFR calc Af Amer >90  >90 (mL/min)   URINALYSIS, ROUTINE W REFLEX MICROSCOPIC     Status: Abnormal   Collection Time   12/07/11 10:31 AM      Component Value Range Comment   Color, Urine YELLOW  YELLOW     APPearance CLEAR  CLEAR     Specific Gravity, Urine 1.031 (*) 1.005 - 1.030     pH 6.0  5.0 - 8.0     Glucose, UA >1000 (*) NEGATIVE (mg/dL)    Hgb urine dipstick TRACE (*) NEGATIVE     Bilirubin Urine NEGATIVE  NEGATIVE     Ketones, ur >80 (*) NEGATIVE (mg/dL)    Protein, ur 295 (*) NEGATIVE (mg/dL)    Urobilinogen, UA 0.2  0.0 - 1.0 (mg/dL)    Nitrite NEGATIVE  NEGATIVE     Leukocytes, UA NEGATIVE  NEGATIVE    URINE  MICROSCOPIC-ADD ON     Status: Abnormal   Collection Time   12/07/11 10:31 AM      Component Value Range Comment   Squamous Epithelial / LPF RARE  RARE     WBC, UA 3-6  <3 (WBC/hpf)    RBC / HPF 0-2  <  3 (RBC/hpf)    Casts HYALINE CASTS (*) NEGATIVE     Urine-Other RARE YEAST     POCT I-STAT 3, BLOOD GAS (G3P V)     Status: Abnormal   Collection Time   12/07/11 12:45 PM      Component Value Range Comment   pH, Ven 7.221 (*) 7.250 - 7.300     pCO2, Ven 32.0 (*) 45.0 - 50.0 (mmHg)    pO2, Ven 21.0 (*) 30.0 - 45.0 (mmHg)    Bicarbonate 13.1 (*) 20.0 - 24.0 (mEq/L)    TCO2 14  0 - 100 (mmol/L)    O2 Saturation 27.0      Acid-base deficit 13.0 (*) 0.0 - 2.0 (mmol/L)    Sample type VENOUS      Comment NOTIFIED PHYSICIAN       Radiological Exams on Admission: No results found.  Assessment/Plan 1. DKA (diabetic ketoacidoses) 2. DM (diabetes mellitus) 3. H/O splenectomy Admit to step down unit Treat with IV insulin using the Glucomander protocol Check Bmet- every 4 hours Check magnesium level IV fluids: Normal saline at 125 cc an hour transition to D5 half-normal when CBG is less than 250 In terms of etiology, I suspect this is secondary to stopping her insulin during the onset of her acute illness 2 weeks ago. Unable to identify an infectious etiology at this point, however given history of splenectomy will check blood cultures and a chest x-ray as well. EKG is unremarkable, urine not suggestive of infection DVT prophylaxis with Lovenox Further management as condition evolves   Time Spent on Admission:  Neal Oshea [facility] Pager: [phone] 12/07/2011, 2:00 PM

## 2011-12-07 NOTE — ED Notes (Signed)
Husband going home to feed animals and then will return

## 2011-12-07 NOTE — ED Notes (Signed)
hospitalist in to see pt.

## 2011-12-07 NOTE — ED Notes (Signed)
Floor notified that pt was dx with Norovirus at her doctor's office earlier this week

## 2011-12-07 NOTE — ED Provider Notes (Addendum)
History     CSN: [id number]  Arrival date & time 12/07/11  4098   First MD Initiated Contact with Patient 12/07/11 1005      Chief Complaint  Patient presents with  . Emesis    (Consider location/radiation/quality/duration/timing/severity/associated sxs/prior treatment) Patient is a 56 y.o. female presenting with vomiting. The history is provided by the patient. The history is limited by the condition of the patient.  Emesis  Associated symptoms include abdominal pain. Pertinent negatives include no chills, no cough, no diarrhea, no fever and no headaches.   the patient is a 56 year old, female, with diabetes mellitus, who presents with uncontrolled vomiting for several days.  She denies diarrhea.  She denies respiratory symptoms.  She has not had fevers, chills, or cough.  She says that she has not been taking her insulin because of her illness.  She says that she has been trying to eat popsicles, but is not able to keep them down.  She denies a history of alcohol.  Use or peptic ulcer disease.  She called her primary care physician, was told to come to the emergency department for evaluation.  Level V caveat applies for severe illness.  Past Medical History  Diagnosis Date  . Diabetes mellitus   . Gout   . Acid reflux     History reviewed. No pertinent past surgical history.  History reviewed. No pertinent family history.  History  Substance Use Topics  . Smoking status: Never Smoker   . Smokeless tobacco: Not on file  . Alcohol Use: No    OB History    Grav Para Term Preterm Abortions TAB SAB Ect Mult Living                  Review of Systems  Constitutional: Negative for fever and chills.  Respiratory: Negative for cough and shortness of breath.   Cardiovascular: Negative for chest pain.  Gastrointestinal: Positive for nausea, vomiting and abdominal pain. Negative for diarrhea.  Genitourinary: Negative for dysuria and urgency.  Neurological: Positive for  weakness. Negative for headaches.  All other systems reviewed and are negative.    Allergies  Review of patient's allergies indicates not on file.  Home Medications   Current Outpatient Rx  Name Route Sig Dispense Refill  . ALLOPURINOL 100 MG PO TABS Oral Take 100 mg by mouth daily.    . ASPIRIN EC 81 MG PO TBEC Oral Take 81 mg by mouth daily.    . INSULIN ISOPHANE HUMAN 100 UNIT/ML Show Low SUSP Subcutaneous Inject 10-35 Units into the skin daily. 35 in the a.m. 10 at night    . MELOXICAM PO Oral Take 1 tablet by mouth at bedtime.    . OMEPRAZOLE 20 MG PO CPDR Oral Take 20 mg by mouth daily.      BP 127/48  Pulse 97  Temp 96.9 F (36.1 C)  Resp 16  SpO2 100%  Physical Exam  Vitals reviewed. Constitutional: She is oriented to person, place, and time. No distress.       Listless patient lying in bed, with, chloride on her forehead, speech, with weakness  HENT:  Head: Normocephalic and atraumatic.       Dry oral mucosa  Eyes: Conjunctivae are normal. Pupils are equal, round, and reactive to light.  Neck: Normal range of motion. Neck supple.  Cardiovascular: Normal rate.   No murmur heard. Pulmonary/Chest: Effort normal and breath sounds normal. No respiratory distress.  Abdominal: Soft. She exhibits no distension. There is  tenderness.       Mild diffuse tenderness.  No peritoneal signs  Musculoskeletal: Normal range of motion. She exhibits no edema.  Neurological: She is alert and oriented to person, place, and time.  Skin: Skin is warm and dry.  Psychiatric: Her behavior is normal. Thought content normal.    ED Course  Procedures (including critical care time) 56 year old insulin-dependent patient presents with uncontrolled nausea and vomiting.  For several days.  She appears severely dehydrated.  We will establish an IV and perform laboratory testing for further evaluation.  I anticipate hospitalization.  Will be necessary.   Labs Reviewed  CBC  DIFFERENTIAL  BASIC  METABOLIC PANEL  URINALYSIS, ROUTINE W REFLEX MICROSCOPIC   No results found.   No diagnosis found.  Insulin gtt for DKA  CRITICAL CARE Performed by: Cheri Guppy P   Total critical care time: 30 min  Critical care time was exclusive of separately billable procedures and treating other patients.  Critical care was necessary to treat or prevent imminent or life-threatening deterioration.  Critical care was time spent personally by me on the following activities: development of treatment plan with patient and/or surrogate as well as nursing, discussions with consultants, evaluation of patient's response to treatment, examination of patient, obtaining history from patient or surrogate, ordering and performing treatments and interventions, ordering and review of laboratory studies, ordering and review of radiographic studies, pulse oximetry and re-evaluation of patient's condition.   12:22 PM Spoke with [facility]. She will admit. She asked for ecg and vbg, which i ordered.  ED ECG REPORT   Date: 12/07/2011  EKG Time: 1:17 PM  Rate: 94  Rhythm: normal sinus rhythm,    Axis: nl  Intervals:none  ST&T Change: none  Narrative Interpretation: nl            MDM  DKA Uncontrolled emesis        Cheri Guppy, MD 12/07/11 1223  Cheri Guppy, MD 12/07/11 1318

## 2011-12-07 NOTE — ED Notes (Signed)
Critical Glucose given to MD 563

## 2011-12-07 NOTE — ED Notes (Signed)
Pt vomited approx 25 ccs of emesis. States she is feeling better than when she arrived

## 2011-12-07 NOTE — ED Notes (Signed)
Pt is sick, sts unable to keep liquids down, complains of just not feeling well, pt weak and unable to care for self, pt has seen md and given abx shot for illness.

## 2011-12-08 LAB — CBC
HCT: 41 % (ref 36.0–46.0)
Hemoglobin: 14.7 g/dL (ref 12.0–15.0)
MCH: 27.4 pg (ref 26.0–34.0)
MCV: 76.5 fL — ABNORMAL LOW (ref 78.0–100.0)
RBC: 5.36 MIL/uL — ABNORMAL HIGH (ref 3.87–5.11)

## 2011-12-08 LAB — BASIC METABOLIC PANEL
BUN: 20 mg/dL (ref 6–23)
BUN: 19 mg/dL (ref 6–23)
CO2: 20 mEq/L (ref 19–32)
CO2: 19 mEq/L (ref 19–32)
Calcium: 8.6 mg/dL (ref 8.4–10.5)
Chloride: 101 mEq/L (ref 96–112)
Chloride: 98 mEq/L (ref 96–112)
GFR calc non Af Amer: >90 mL/min (ref >90)
Glucose, Bld: 187 mg/dL — ABNORMAL HIGH (ref 70–99)
Glucose, Bld: 242 mg/dL — ABNORMAL HIGH (ref 70–99)
Potassium: 2.9 mEq/L — ABNORMAL LOW (ref 3.5–5.1)
Potassium: 2.7 mEq/L — CL (ref 3.5–5.1)
Potassium: 3.6 mEq/L (ref 3.5–5.1)
Sodium: 130 mEq/L — ABNORMAL LOW (ref 135–145)

## 2011-12-08 LAB — GLUCOSE, CAPILLARY
Glucose-Capillary: 271 mg/dL — ABNORMAL HIGH (ref 70–99)
Glucose-Capillary: 226 mg/dL — ABNORMAL HIGH (ref 70–99)
Glucose-Capillary: 153 mg/dL — ABNORMAL HIGH (ref 70–99)

## 2011-12-08 MED ORDER — PANTOPRAZOLE SODIUM 40 MG PO TBEC
40.0000 mg | DELAYED_RELEASE_TABLET | Freq: Every day | ORAL | Status: DC
  Administered 2011-12-08 – 2011-12-10 (×3): 40 mg via ORAL
  Filled 2011-12-08 (×3): qty 1

## 2011-12-08 MED ORDER — INSULIN ASPART PROT & ASPART (70-30 MIX) 100 UNIT/ML ~~LOC~~ SUSP
15.0000 [IU] | Freq: Every day | SUBCUTANEOUS | Status: DC
  Administered 2011-12-08 – 2011-12-10 (×3): 15 [IU] via SUBCUTANEOUS
  Filled 2011-12-08: qty 3

## 2011-12-08 MED ORDER — POTASSIUM CHLORIDE CRYS ER 20 MEQ PO TBCR
40.0000 meq | EXTENDED_RELEASE_TABLET | Freq: Once | ORAL | Status: AC
  Administered 2011-12-08: 40 meq via ORAL
  Filled 2011-12-08: qty 2

## 2011-12-08 MED ORDER — INSULIN ASPART 100 UNIT/ML ~~LOC~~ SOLN
0.0000 [IU] | Freq: Three times a day (TID) | SUBCUTANEOUS | Status: DC
  Administered 2011-12-08 (×2): 3 [IU] via SUBCUTANEOUS
  Administered 2011-12-09: 2 [IU] via SUBCUTANEOUS
  Administered 2011-12-09: 5 [IU] via SUBCUTANEOUS
  Administered 2011-12-09: 3 [IU] via SUBCUTANEOUS
  Administered 2011-12-10: 7 [IU] via SUBCUTANEOUS
  Administered 2011-12-10: 3 [IU] via SUBCUTANEOUS

## 2011-12-08 MED ORDER — SODIUM CHLORIDE 0.45 % IV SOLN
INTRAVENOUS | Status: DC
  Administered 2011-12-08 – 2011-12-09 (×3): via INTRAVENOUS
  Filled 2011-12-08 (×6): qty 1000

## 2011-12-08 MED ORDER — INSULIN ASPART PROT & ASPART (70-30 MIX) 100 UNIT/ML ~~LOC~~ SUSP
10.0000 [IU] | Freq: Every day | SUBCUTANEOUS | Status: DC
  Administered 2011-12-08 – 2011-12-09 (×2): 10 [IU] via SUBCUTANEOUS
  Filled 2011-12-08: qty 3

## 2011-12-08 MED ORDER — INSULIN ASPART 100 UNIT/ML ~~LOC~~ SOLN
0.0000 [IU] | SUBCUTANEOUS | Status: DC
  Administered 2011-12-08: 8 [IU] via SUBCUTANEOUS

## 2011-12-08 MED ORDER — ENOXAPARIN SODIUM 40 MG/0.4ML ~~LOC~~ SOLN
40.0000 mg | SUBCUTANEOUS | Status: DC
  Administered 2011-12-08: 40 mg via SUBCUTANEOUS
  Filled 2011-12-08 (×2): qty 0.4

## 2011-12-08 NOTE — Progress Notes (Signed)
Subjective: Doing better, some vomiting yesterday  Objective: Vital signs in last 24 hours: Temp:  [96.9 F (36.1 C)-98.4 F (36.9 C)] 98.1 F (36.7 C) (03/17 0300) Pulse Rate:  [81-102] 81  (03/17 0300) Resp:  [11-18] 14  (03/17 0300) BP: (123-151)/(48-91) 123/71 mmHg (03/17 0300) SpO2:  [97 %-100 %] 97 % (03/17 0300) Weight:  [58.8 kg (129 lb 10.1 oz)] 58.8 kg (129 lb 10.1 oz) (03/16 1446) Weight change:     Intake/Output from previous day: 03/16 0701 - 03/17 0700 In: 725 [I.V.:725] Out: 250 [Urine:250]     Physical Exam: General: Alert, awake, oriented x3, in no acute distress. HEENT: No bruits, no goiter. Heart: Regular rate and rhythm, without murmurs, rubs, gallops. Lungs: Clear to auscultation bilaterally. Abdomen: Soft, nontender, nondistended, positive bowel sounds. Extremities: No clubbing cyanosis or edema with positive pedal pulses. Neuro: Grossly intact, nonfocal.   Lab Results: Basic Metabolic Panel:  Basename 12/08/11 0500 12/08/11 0300 12/07/11 1550  NA 133* 138 --  K 2.7* 2.9* --  CL 101 102 --  CO2 20 22 --  GLUCOSE 242* 187* --  BUN 19 20 --  CREATININE 0.48* 0.53 --  CALCIUM 9.1 9.2 --  MG -- -- 2.2  PHOS -- -- --   Liver Function Tests: No results found for this basename: AST:2,ALT:2,ALKPHOS:2,BILITOT:2,PROT:2,ALBUMIN:2 in the last 72 hours No results found for this basename: LIPASE:2,AMYLASE:2 in the last 72 hours No results found for this basename: AMMONIA:2 in the last 72 hours CBC:  Basename 12/08/11 0500 12/07/11 1550 12/07/11 1019  WBC 13.4* 12.4* --  NEUTROABS -- -- 10.0*  HGB 14.7 16.7* --  HCT 41.0 46.5* --  MCV 76.5* 77.1* --  PLT 132* 137* --   Cardiac Enzymes: No results found for this basename: CKTOTAL:3,CKMB:3,CKMBINDEX:3,TROPONINI:3 in the last 72 hours BNP: No results found for this basename: PROBNP:3 in the last 72 hours D-Dimer: No results found for this basename: DDIMER:2 in the last 72 hours CBG:  Basename  12/08/11 0342 12/08/11 0129 12/08/11 0026 12/07/11 2323 12/07/11 2222 12/07/11 2118  GLUCAP 264* 144* 120* 136* 134* 149*   Hemoglobin A1C: No results found for this basename: HGBA1C in the last 72 hours Fasting Lipid Panel: No results found for this basename: CHOL,HDL,LDLCALC,TRIG,CHOLHDL,LDLDIRECT in the last 72 hours Thyroid Function Tests: No results found for this basename: TSH,T4TOTAL,FREET4,T3FREE,THYROIDAB in the last 72 hours Anemia Panel: No results found for this basename: VITAMINB12,FOLATE,FERRITIN,TIBC,IRON,RETICCTPCT in the last 72 hours Coagulation: No results found for this basename: LABPROT:2,INR:2 in the last 72 hours Urine Drug Screen: Drugs of Abuse  No results found for this basename: labopia, cocainscrnur, labbenz, amphetmu, thcu, labbarb    Alcohol Level: No results found for this basename: ETH:2 in the last 72 hours Urinalysis:  Basename 12/07/11 1031  COLORURINE YELLOW  LABSPEC 1.031*  PHURINE 6.0  GLUCOSEU >1000*  HGBUR TRACE*  BILIRUBINUR NEGATIVE  KETONESUR >80*  PROTEINUR 100*  UROBILINOGEN 0.2  NITRITE NEGATIVE  LEUKOCYTESUR NEGATIVE    Recent Results (from the past 240 hour(s))  MRSA PCR SCREENING     Status: Normal   Collection Time   12/07/11  3:47 PM      Component Value Range Status Comment   MRSA by PCR NEGATIVE  NEGATIVE  Final     Studies/Results: Dg Chest Port 1 View  12/07/2011  *RADIOLOGY REPORT*  Clinical Data: Rule out pneumonia, cough, congestion, weakness  PORTABLE CHEST - 1 VIEW  Comparison: 05/31/2010  Findings: Cardiomediastinal silhouette is stable.  No  acute infiltrate or pleural effusion.  No pulmonary edema.  Bony thorax is stable.  IMPRESSION: No active disease.  Original Report Authenticated By: Natasha Mead, M.D.    Medications: Scheduled Meds:   . enoxaparin  30 mg Subcutaneous Q24H  . insulin aspart  0-9 Units Subcutaneous TID WC  . insulin aspart protamine-insulin aspart  10 Units Subcutaneous Q supper  .  insulin aspart protamine-insulin aspart  15 Units Subcutaneous Q breakfast  . insulin glargine  10 Units Subcutaneous Daily  .  morphine injection  4 mg Intravenous Once  . ondansetron  4 mg Intravenous Once  . potassium chloride  40 mEq Oral Once  . sodium chloride  500 mL Intravenous Once  . DISCONTD: insulin aspart  0-15 Units Subcutaneous Q4H  . DISCONTD: insulin glargine  10 Units Subcutaneous Daily  . DISCONTD: insulin regular  0-10 Units Intravenous TID WC   Continuous Infusions:   . sodium chloride 0.45 % 1,000 mL with potassium chloride 40 mEq infusion    . DISCONTD: sodium chloride    . DISCONTD: sodium chloride 125 mL/hr at 12/07/11 1739  . DISCONTD: dextrose 5 % and 0.45% NaCl 100 mL/hr at 12/08/11 0600  . DISCONTD: insulin (NOVOLIN-R) infusion 3.8 Units/hr (12/07/11 1337)  . DISCONTD: insulin (NOVOLIN-R) infusion 1.7 Units/hr (12/08/11 0131)   PRN Meds:.acetaminophen, acetaminophen, dextrose, HYDROmorphone, ondansetron (ZOFRAN) IV, ondansetron  Assessment/Plan: 1. DKA (diabetic ketoacidoses)  2. DM (diabetes mellitus)  3. H/O splenectomy  4. Hypokalemia DKA resolved Started lantus at mid-night Transition to home insulin 70/30 Change Bmet to Q12 Start carb modified diet IV fluids: 1/2 Normal saline at 125 cc an hour with KCL In terms of etiology, I suspect this is secondary to stopping her insulin during the onset of her acute illness 2 weeks ago.  Unable to identify an infectious etiology at this point. CXR/UA not suggestive of infection, FU blood cultures Transfer to floor DVT prophylaxis with Lovenox Home tomorrow if stable  Active Problems:  DKA (diabetic ketoacidoses)  DM (diabetes mellitus)  H/O splenectomy    LOS: 1 day   [facility] Pager: [phone] 12/08/2011, 7:41 AM

## 2011-12-08 NOTE — Progress Notes (Signed)
Pt transferred to room 5041, per MD order. Report called to receiving and all questions answered. Pt belongings transferred to new room.

## 2011-12-09 LAB — BASIC METABOLIC PANEL
Calcium: 8.6 mg/dL (ref 8.4–10.5)
GFR calc non Af Amer: >90 mL/min (ref >90)
Potassium: 3.8 mEq/L (ref 3.5–5.1)
Sodium: 134 mEq/L — ABNORMAL LOW (ref 135–145)

## 2011-12-09 LAB — CBC
MCH: 27.1 pg (ref 26.0–34.0)
MCHC: 35.6 g/dL (ref 30.0–36.0)
Platelets: 117 10*3/uL — ABNORMAL LOW (ref 150–400)
RBC: 5.09 MIL/uL (ref 3.87–5.11)

## 2011-12-09 LAB — GLUCOSE, CAPILLARY: Glucose-Capillary: 176 mg/dL — ABNORMAL HIGH (ref 70–99)

## 2011-12-09 MED ORDER — SODIUM CHLORIDE 0.45 % IV SOLN
INTRAVENOUS | Status: DC
  Administered 2011-12-09: 12:00:00 via INTRAVENOUS

## 2011-12-09 MED FILL — Insulin Aspart Prot & Aspart (Human) Inj 100 Unit/ML (70-30): SUBCUTANEOUS | Qty: 0.15 | Status: AC

## 2011-12-09 NOTE — Progress Notes (Signed)
Subjective: Doing better, some vomiting yesterday  Objective: Vital signs in last 24 hours: Temp:  [98.2 F (36.8 C)-98.5 F (36.9 C)] 98.2 F (36.8 C) (03/18 0629) Pulse Rate:  [84-88] 84  (03/18 0629) Resp:  [13-20] 20  (03/18 0629) BP: (105-128)/(66-69) 128/66 mmHg (03/18 0629) SpO2:  [93 %-98 %] 96 % (03/18 0629) Weight change:  Last BM Date:  (Prior to admission )  Intake/Output from previous day: 03/17 0701 - 03/18 0700 In: 2182.9 [P.O.:960; I.V.:1222.9] Out: 950 [Urine:950]     Physical Exam: General: Alert, awake, oriented x3, in no acute distress. HEENT: No bruits, no goiter. Heart: Regular rate and rhythm, without murmurs, rubs, gallops. Lungs: Clear to auscultation bilaterally. Abdomen: Soft, nontender, nondistended, positive bowel sounds. Extremities: No clubbing cyanosis or edema with positive pedal pulses. Neuro: Grossly intact, nonfocal.   Lab Results: Basic Metabolic Panel:  Basename 12/09/11 0510 12/08/11 1840 12/07/11 1550  NA 134* 130* --  K 3.8 3.6 --  CL 103 98 --  CO2 22 19 --  GLUCOSE 168* 273* --  BUN 9 13 --  CREATININE 0.42* 0.44* --  CALCIUM 8.6 8.6 --  MG -- -- 2.2  PHOS -- -- --   Liver Function Tests: No results found for this basename: AST:2,ALT:2,ALKPHOS:2,BILITOT:2,PROT:2,ALBUMIN:2 in the last 72 hours No results found for this basename: LIPASE:2,AMYLASE:2 in the last 72 hours No results found for this basename: AMMONIA:2 in the last 72 hours CBC:  Basename 12/09/11 0510 12/08/11 0500 12/07/11 1019  WBC 6.9 13.4* --  NEUTROABS -- -- 10.0*  HGB 13.8 14.7 --  HCT 38.8 41.0 --  MCV 76.2* 76.5* --  PLT 117* 132* --   Cardiac Enzymes: No results found for this basename: CKTOTAL:3,CKMB:3,CKMBINDEX:3,TROPONINI:3 in the last 72 hours BNP: No results found for this basename: PROBNP:3 in the last 72 hours D-Dimer: No results found for this basename: DDIMER:2 in the last 72 hours CBG:  Basename 12/09/11 0645 12/08/11 2141  12/08/11 1610 12/08/11 1206 12/08/11 0740 12/08/11 0342  GLUCAP 176* 153* 226* 207* 271* 264*   Hemoglobin A1C: No results found for this basename: HGBA1C in the last 72 hours Fasting Lipid Panel: No results found for this basename: CHOL,HDL,LDLCALC,TRIG,CHOLHDL,LDLDIRECT in the last 72 hours Thyroid Function Tests: No results found for this basename: TSH,T4TOTAL,FREET4,T3FREE,THYROIDAB in the last 72 hours Anemia Panel: No results found for this basename: VITAMINB12,FOLATE,FERRITIN,TIBC,IRON,RETICCTPCT in the last 72 hours Coagulation: No results found for this basename: LABPROT:2,INR:2 in the last 72 hours Urine Drug Screen: Drugs of Abuse  No results found for this basename: labopia,  cocainscrnur,  labbenz,  amphetmu,  thcu,  labbarb    Alcohol Level: No results found for this basename: ETH:2 in the last 72 hours Urinalysis:  Basename 12/07/11 1031  COLORURINE YELLOW  LABSPEC 1.031*  PHURINE 6.0  GLUCOSEU >1000*  HGBUR TRACE*  BILIRUBINUR NEGATIVE  KETONESUR >80*  PROTEINUR 100*  UROBILINOGEN 0.2  NITRITE NEGATIVE  LEUKOCYTESUR NEGATIVE    Recent Results (from the past 240 hour(s))  MRSA PCR SCREENING     Status: Normal   Collection Time   12/07/11  3:47 PM      Component Value Range Status Comment   MRSA by PCR NEGATIVE  NEGATIVE  Final   CULTURE, BLOOD (ROUTINE X 2)     Status: Normal (Preliminary result)   Collection Time   12/07/11  3:50 PM      Component Value Range Status Comment   Specimen Description BLOOD RIGHT HAND   Final  Special Requests BOTTLES DRAWN AEROBIC ONLY 5CC   Final    Culture  Setup Time [id number]   Final    Culture     Final    Value:        BLOOD CULTURE RECEIVED NO GROWTH TO DATE CULTURE WILL BE HELD FOR 5 DAYS BEFORE ISSUING A FINAL NEGATIVE REPORT   Report Status PENDING   Incomplete   CULTURE, BLOOD (ROUTINE X 2)     Status: Normal (Preliminary result)   Collection Time   12/07/11  3:56 PM      Component Value Range Status  Comment   Specimen Description BLOOD LEFT HAND   Final    Special Requests BOTTLES DRAWN AEROBIC ONLY 5CC   Final    Culture  Setup Time [id number]   Final    Culture     Final    Value:        BLOOD CULTURE RECEIVED NO GROWTH TO DATE CULTURE WILL BE HELD FOR 5 DAYS BEFORE ISSUING A FINAL NEGATIVE REPORT   Report Status PENDING   Incomplete     Studies/Results: Dg Chest Port 1 View  12/07/2011  *RADIOLOGY REPORT*  Clinical Data: Rule out pneumonia, cough, congestion, weakness  PORTABLE CHEST - 1 VIEW  Comparison: 05/31/2010  Findings: Cardiomediastinal silhouette is stable.  No acute infiltrate or pleural effusion.  No pulmonary edema.  Bony thorax is stable.  IMPRESSION: No active disease.  Original Report Authenticated By: Natasha Mead, M.D.    Medications: Scheduled Meds:    . insulin aspart  0-9 Units Subcutaneous TID WC  . insulin aspart protamine-insulin aspart  10 Units Subcutaneous Q supper  . insulin aspart protamine-insulin aspart  15 Units Subcutaneous Q breakfast  . pantoprazole  40 mg Oral Q1200  . DISCONTD: enoxaparin  40 mg Subcutaneous Q24H  . DISCONTD: insulin glargine  10 Units Subcutaneous Daily   Continuous Infusions:    . sodium chloride    . DISCONTD: sodium chloride 0.45 % 1,000 mL with potassium chloride 40 mEq infusion 125 mL/hr at 12/09/11 0442   PRN Meds:.acetaminophen, acetaminophen, dextrose, HYDROmorphone, ondansetron (ZOFRAN) IV, ondansetron  Assessment/Plan: 1. DKA (diabetic ketoacidoses)  2. DM (diabetes mellitus)  3. H/O splenectomy  4. Hypokalemia DKA resolved CBG improved on insulin 70/30 Change Bmet to Q12 Start carb modified diet Cut down IV fluids: 1/2 Normal saline to 50cc/hr In terms of etiology, I suspect this is secondary to stopping her insulin during the onset of her acute illness 2 weeks ago.  Unable to identify an infectious etiology at this point. CXR/UA not suggestive of infection, blood cultures negative so far Ambulate,  requesting to stay another day  Home tomorrow     LOS: 2 days   [facility] Pager: [phone] 12/09/2011, 11:29 AM

## 2011-12-09 NOTE — Progress Notes (Signed)
Inpatient Diabetes Program Recommendations  AACE/ADA: New Consensus Statement on Inpatient Glycemic Control (2009)  Target Ranges:  Prepandial:   less than 140 mg/dL      Peak postprandial:   less than 180 mg/dL (1-2 hours)      Critically ill patients:  140 - 180 mg/dL   Reason for Visit: Hyperglycemia, DKA on admission Talked at length with patient regarding her history of diabetes and her blood glucose history/control prior to admission.  Pt states that when she first became sick, she called her doctor's office, and the PA told her not to take any insulin while she could not eat.  She had been taking only NPH twice a day, but she states her blood sugars are always in the 200 range. 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  Most likely, the remaining beta cells of her pancreas have very little reserve if any to produce insulin at this time.  Pt states she checks her blood sugars at least 3 times per day and is most willing to use a basal insulin (Lantus) once a day and meal coverage with correction (Novolog)  tidwc and correction at HS.  Regarding cost [employer] is issuing coupons for Lantus pens for insured pts to buy at just $25.00 a vial and Apidra (analogue identical to Novolog) for no cost at all in vials for insured patients.  This has been offered for well over a year now.  I have calculated patient's insulin needs based on her weight and would like to recommend basal doses and correction/meal coverage doses based on her needs. Based on patient's weight of 101 lbs or 47 kg, pt needs minimally to start 20 units Lantus and 1 unit per 14 grams (per serving) of carbohydrate.  I would recommend starting with 4 units Novolog meal coverage and using the sensitive correction scale tidwc plus the HS correction scale.  Pt is most willing to learn carbohydrate counting and portion control (she can learn the basics  while here via dietician consult and ed'l videos and my instruction as well).  Pt does not need to be bound to a set 70/30 dose which does not allow for flexibility for basal as well as meal coverage and correction needs.  Pt could be given scripts for Lantus and for Apidra, and I will assist with enrolling her in the [employer] assistance program.  Pt would like a referral also to the Nutrition and Diabetes Management Program OP education center for further education on carbohydrate counting. If not already done, please order a HgbA1C to assess control prior to hospitalization. Will follow up in the am. Please consider starting Lantus 20 units tomorrow am with meal coverage and correction as recommended. Will order RD consult for carb counting and request staff to assist pt watching dm videos on carb counting and nutritonal shopping. Thank you, Lenor Coffin, RN, CNS, Diabetes Coordinator [phone])   Inpatient Diabetes Program Recommendations Insulin - Basal: Lantus 20 units to start Correction (SSI): sensitive correction tidwc plus HS scale Insulin - Meal Coverage: 4 units Novolog tidwc - (calculated needs as 1 unit per 14 grams carbohydtrate, i.e. 1 unit per serving)  Note: Thank you, Lenor Coffin, RN, CNS, Diabetes Coordinator [phone])

## 2011-12-10 LAB — GLUCOSE, CAPILLARY: Glucose-Capillary: 349 mg/dL — ABNORMAL HIGH (ref 70–99)

## 2011-12-10 LAB — BASIC METABOLIC PANEL
BUN: 11 mg/dL (ref 6–23)
CO2: 29 mEq/L (ref 19–32)
Chloride: 99 mEq/L (ref 96–112)
Glucose, Bld: 303 mg/dL — ABNORMAL HIGH (ref 70–99)
Potassium: 3.7 mEq/L (ref 3.5–5.1)

## 2011-12-10 LAB — CBC
HCT: 37.8 % (ref 36.0–46.0)
Hemoglobin: 12.5 g/dL (ref 12.0–15.0)
MCH: 25.9 pg — ABNORMAL LOW (ref 26.0–34.0)
MCHC: 33.1 g/dL (ref 30.0–36.0)
MCV: 78.3 fL (ref 78.0–100.0)

## 2011-12-10 MED ORDER — INSULIN GLARGINE 100 UNIT/ML ~~LOC~~ SOLN: 20.0000 [IU] | Freq: Every day | SUBCUTANEOUS | Status: AC

## 2011-12-10 MED ORDER — GUAIFENESIN ER 600 MG PO TB12
600.0000 mg | ORAL_TABLET | Freq: Two times a day (BID) | ORAL | Status: DC | PRN
  Administered 2011-12-10: 600 mg via ORAL
  Filled 2011-12-10: qty 1

## 2011-12-10 MED ORDER — INSULIN ASPART 100 UNIT/ML ~~LOC~~ SOLN: 4.0000 [IU] | Freq: Three times a day (TID) | SUBCUTANEOUS | Status: AC

## 2011-12-10 MED FILL — Insulin Aspart Prot & Aspart (Human) Inj 100 Unit/ML (70-30): SUBCUTANEOUS | Qty: 10 | Status: AC

## 2011-12-10 NOTE — Progress Notes (Signed)
Utilization Review Completed.[name] T3/19/2013   

## 2011-12-10 NOTE — Consult Note (Signed)
Inpatient Diabetes Program Recommendations  AACE/ADA: New Consensus Statement on Inpatient Glycemic Control (2009)  Target Ranges:  Prepandial:   less than 140 mg/dL      Peak postprandial:   less than 180 mg/dL (1-2 hours)      Critically ill patients:  140 - 180 mg/dL   Reason for Visit: Revisit patient with recommendations for hyperglycemia at discharge  Inpatient Diabetes Program Recommendations Insulin - Basal: Lantus 20 units to start Correction (SSI): sensitive correction tidwc plus HS scale Insulin - Meal Coverage: 4 units Novolog tidwc - (calculated needs as 1 unit per 14 grams carbohydtrate, i.e. 1 unit per serving)  Note: Met again with patient who states she would really like to try the Lantus and Apidra regimen I outlined yesterday per previous note.  If 70/30 is her only choice, please increase doses to meet her needs.  However, she cannot adjust the basal with adjustment the bolus which makes it hard to control.  Pts on this regimen are usually those who cannot afford the Lantus/Novolog (Apidra) or who don't want to bother with checking their cbg's.  This patient has insurance and wants to control her diabetes with whatever effort it takes with diet, exercise, monitoring and insulin. Thank you, Lenor Coffin, RN, CNS, Diabetes Coordinator [phone])

## 2011-12-10 NOTE — Plan of Care (Signed)
Problem: Food- and Nutrition-Related Knowledge Deficit (NB-1.1) Goal: Nutrition education Formal process to instruct or train a patient/client in a skill or to impart knowledge to help patients/clients voluntarily manage or modify food choices and eating behavior to maintain or improve health.  Outcome: Completed/Met Date Met:  12/10/11 RD consult for DM diet education. Reviewed diet guidelines and recommendations. Handouts provided from Academy [employer]: Carbohydrate Counting for People with Diabetes. Questions answered. Expect good compliance. Pt consuming 100% on a Carbohydrate Modified Medium Calorie diet. BMI = 25.4 kg/m2. No further nutrition intervention warranted at this time.

## 2011-12-13 LAB — CULTURE, BLOOD (ROUTINE X 2): Culture: NO GROWTH

## 2012-01-01 NOTE — Discharge Summary (Signed)
Physician Discharge Summary  Patient ID: [name] MRN: [id number] DOB/AGE: [birth year] 58 y.o.  Admit date: 12/07/2011 Discharge date: 01/01/2012  Primary Care Physician:  Nadean Corwin, MD, MD   Discharge Diagnoses:   1.  DKA (diabetic ketoacidoses)  2. DM (diabetes mellitus)  3. nausea and vomiting resolved  4. H/O splenectomy 5. history of gout 6. GERD  Medication List  As of 01/01/2012  3:25 PM   STOP taking these medications         allopurinol 100 MG tablet      azithromycin 250 MG tablet      insulin NPH 100 UNIT/ML injection      meloxicam 15 MG tablet      MELOXICAM PO      predniSONE 20 MG tablet      promethazine-codeine 6.25-10 MG/5ML syrup         TAKE these medications         allopurinol 300 MG tablet   Commonly known as: ZYLOPRIM   Take 300 mg by mouth daily.      ALPRAZolam 0.5 MG tablet   Commonly known as: XANAX   Take 0.5 mg by mouth at bedtime as needed. For anxiety      aspirin EC 81 MG tablet   Take 81 mg by mouth daily.      insulin aspart 100 UNIT/ML injection   Commonly known as: novoLOG   Inject 4 Units into the skin 3 (three) times daily before meals.      insulin glargine 100 UNIT/ML injection   Commonly known as: LANTUS   Inject 20 Units into the skin at bedtime.      omeprazole 20 MG capsule   Commonly known as: PRILOSEC   Take 20 mg by mouth daily.           Disposition and Follow-up:  PCP in 1 week  Significant Diagnostic Studies:  No results found.  Brief H and P: [name] is a 56 year old female, history of diabetes on insulin initially presented to her primary doctor about 10-14 days ago with nausea and vomiting they originally thought that she had a norovirus infection, her symptoms persisted and she stopped taking her insulin about 2 weeks ago.  Since then she has had persistent nausea and vomiting associated with lower back and stomach ache, and chills without any fevers.  She  denies any diarrhea, cough congestion upper respiratory symptoms or changes in urinary habits as well.  On Thursday she went to Dr. Kathryne Sharper office was given an antibiotic shot along with Phenergan.  In presents to the ER today where upon evaluation she was found to have diabetic ketoacidosis.  Hospital Course:  1. DKA (diabetic ketoacidoses) Treated with IV insulin using the Glucomander protocol , had potassium and magnesium repleted.  Treated with aggressive hydration initially with normal saline subsequent transition to D5 1/2 NS when CBG was less than 250.  In terms of etiology, I suspect this was felt to be secondary to stopping her insulin during the onset of her acute illness 2 weeks ago. Her chest x-ray urinalysis and blood cultures were unremarkable and EKG was unremarkable as well. She was then transitioned to Lantus and NovoLog, which she is being discharged on will need further titration as outpatient.  Time spent on Discharge: Signed: Haskel Dewalt [facility]  01/01/2012, 3:25 PM

## 2012-01-20 ENCOUNTER — Other Ambulatory Visit (HOSPITAL_COMMUNITY): Payer: Self-pay | Admitting: Internal Medicine

## 2012-01-20 DIAGNOSIS — Z1231 Encounter for screening mammogram for malignant neoplasm of breast: Secondary | ICD-10-CM

## 2012-03-02 ENCOUNTER — Ambulatory Visit (HOSPITAL_COMMUNITY): Payer: BC Managed Care – PPO

## 2012-03-06 ENCOUNTER — Ambulatory Visit (HOSPITAL_COMMUNITY): Payer: BC Managed Care – PPO

## 2012-03-30 ENCOUNTER — Ambulatory Visit (HOSPITAL_COMMUNITY): Payer: BC Managed Care – PPO

## 2013-02-03 ENCOUNTER — Emergency Department (HOSPITAL_COMMUNITY)
Admission: EM | Admit: 2013-02-03 | Discharge: 2013-02-04 | Disposition: A | Discharge status: Home / Self Care | Payer: Worker's Compensation | Attending: Emergency Medicine | Admitting: Emergency Medicine

## 2013-02-03 ENCOUNTER — Emergency Department (HOSPITAL_COMMUNITY): Payer: Worker's Compensation

## 2013-02-03 ENCOUNTER — Encounter (HOSPITAL_COMMUNITY)
Admission: EM | Disposition: A | Discharge status: Home / Self Care | Payer: Self-pay | Source: Home / Self Care | Attending: Emergency Medicine

## 2013-02-03 ENCOUNTER — Encounter (HOSPITAL_COMMUNITY): Payer: Self-pay

## 2013-02-03 DIAGNOSIS — W1789XA Other fall from one level to another, initial encounter: Secondary | ICD-10-CM | POA: Insufficient documentation

## 2013-02-03 DIAGNOSIS — E119 Type 2 diabetes mellitus without complications: Secondary | ICD-10-CM | POA: Insufficient documentation

## 2013-02-03 DIAGNOSIS — Y990 Civilian activity done for income or pay: Secondary | ICD-10-CM | POA: Insufficient documentation

## 2013-02-03 DIAGNOSIS — Y929 Unspecified place or not applicable: Secondary | ICD-10-CM | POA: Insufficient documentation

## 2013-02-03 DIAGNOSIS — K219 Gastro-esophageal reflux disease without esophagitis: Secondary | ICD-10-CM | POA: Insufficient documentation

## 2013-02-03 DIAGNOSIS — M25519 Pain in unspecified shoulder: Secondary | ICD-10-CM | POA: Diagnosis not present

## 2013-02-03 DIAGNOSIS — S42293A Other displaced fracture of upper end of unspecified humerus, initial encounter for closed fracture: Secondary | ICD-10-CM | POA: Insufficient documentation

## 2013-02-03 DIAGNOSIS — Y9389 Activity, other specified: Secondary | ICD-10-CM | POA: Insufficient documentation

## 2013-02-03 DIAGNOSIS — S4292XA Fracture of left shoulder girdle, part unspecified, initial encounter for closed fracture: Secondary | ICD-10-CM

## 2013-02-03 HISTORY — PX: SHOULDER CLOSED REDUCTION: SHX1051

## 2013-02-03 LAB — BASIC METABOLIC PANEL
Chloride: 104 mEq/L (ref 96–112)
GFR calc Af Amer: >90 mL/min (ref >90)
Potassium: 3.6 mEq/L (ref 3.5–5.1)
Sodium: 138 mEq/L (ref 135–145)

## 2013-02-03 LAB — GLUCOSE, CAPILLARY: Glucose-Capillary: 357 mg/dL — ABNORMAL HIGH (ref 70–99)

## 2013-02-03 LAB — CBC
Platelets: 263 10*3/uL (ref 150–400)
RDW: 14.2 % (ref 11.5–15.5)
WBC: 16.8 10*3/uL — ABNORMAL HIGH (ref 4.0–10.5)

## 2013-02-03 SURGERY — MANIPULATION, JOINT, SHOULDER, WITH ANESTHESIA
Anesthesia: General | Site: Shoulder | Laterality: Left | Wound class: Clean

## 2013-02-03 MED ORDER — FENTANYL CITRATE 0.05 MG/ML IJ SOLN
50.0000 ug | Freq: Once | INTRAMUSCULAR | Status: AC
  Administered 2013-02-03: 50 ug via INTRAVENOUS
  Filled 2013-02-03: qty 2

## 2013-02-03 MED ORDER — PROPOFOL 10 MG/ML IV BOLUS
INTRAVENOUS | Status: AC
  Administered 2013-02-03: 90 mg
  Filled 2013-02-03: qty 1

## 2013-02-03 MED ORDER — FENTANYL CITRATE 0.05 MG/ML IJ SOLN
50.0000 ug | Freq: Once | INTRAMUSCULAR | Status: AC
  Administered 2013-02-03: 50 ug via INTRAVENOUS

## 2013-02-03 MED ORDER — ONDANSETRON HCL 4 MG/2ML IJ SOLN
4.0000 mg | Freq: Once | INTRAMUSCULAR | Status: AC
  Administered 2013-02-03: 4 mg via INTRAVENOUS
  Filled 2013-02-03: qty 2

## 2013-02-03 MED ORDER — PROPOFOL 10 MG/ML IV BOLUS
0.5000 mg/kg | Freq: Once | INTRAVENOUS | Status: AC
  Administered 2013-02-03: 60 mg via INTRAVENOUS
  Filled 2013-02-03: qty 1

## 2013-02-03 MED ORDER — INSULIN ASPART 100 UNIT/ML ~~LOC~~ SOLN
5.0000 [IU] | Freq: Once | SUBCUTANEOUS | Status: AC
  Administered 2013-02-03: via SUBCUTANEOUS
  Filled 2013-02-03: qty 1

## 2013-02-03 MED ORDER — FENTANYL CITRATE 0.05 MG/ML IJ SOLN: 50.0000 ug | Freq: Once | INTRAMUSCULAR | Status: DC

## 2013-02-03 MED ORDER — FENTANYL CITRATE 0.05 MG/ML IJ SOLN
100.0000 ug | Freq: Once | INTRAMUSCULAR | Status: DC
  Filled 2013-02-03: qty 2

## 2013-02-03 MED ORDER — SODIUM CHLORIDE 0.9 % IV BOLUS (SEPSIS)
1000.0000 mL | Freq: Once | INTRAVENOUS | Status: AC
  Administered 2013-02-03: 1000 mL via INTRAVENOUS

## 2013-02-03 SURGICAL SUPPLY — 8 items
CLOTH BEACON ORANGE TIMEOUT ST (SAFETY) ×2 IMPLANT
GLOVE BIO SURGEON STRL SZ7.5 (GLOVE) ×1 IMPLANT
GLOVE BIO SURGEON STRL SZ8.5 (GLOVE) ×1 IMPLANT
GLOVE SURG ORTHO 7.0 STRL STRW (GLOVE) ×1 IMPLANT
GLOVE SURG ORTHO 8.0 STRL STRW (GLOVE) ×1 IMPLANT
MANIFOLD NEPTUNE II (INSTRUMENTS) ×1 IMPLANT
POSITIONER SURGICAL ARM (MISCELLANEOUS) ×1 IMPLANT
SLING ARM IMMOBILIZER MED (SOFTGOODS) ×1 IMPLANT

## 2013-02-03 NOTE — Consult Note (Signed)
NAME: [name] MRN:   [id number] DOB:   04-27-1956   CHIEF COMPLAINT:  Right shoulder pain  HISTORY:   [name] a 57 y.o. female  with right  Shoulder Pain Patient complains of right shoulder pain. The symptoms began today. Aggravating factors: injury while in the yard. Pain is located in the anterior glenohumeral region. Discomfort is described as sharp/stabbing. Symptoms are exacerbated by overhead movements. Evaluation to date: plain films: abnormal with a fracture dislocation of the glenohumeral joint.. Therapy to date includes: attempted reduction by the ER physician.Marland Kitchen   PAST MEDICAL HISTORY:   Past Medical History  Diagnosis Date  . Diabetes mellitus   . Gout   . Acid reflux     PAST SURGICAL HISTORY:   Past Surgical History  Procedure Laterality Date  . Pancreatectomy    . Splenectomy      MEDICATIONS:   (Not in a hospital admission)  ALLERGIES:  No Known Allergies  REVIEW OF SYSTEMS:   Negative except current issue General ROS: negative  FAMILY HISTORY:   Family History  Problem Relation Age of Onset  . Dementia Mother   . Heart disease Father     SOCIAL HISTORY:   reports that she has quit smoking. She does not have any smokeless tobacco history on file. She reports that she does not drink alcohol or use illicit drugs.  PHYSICAL EXAM:  General appearance: alert    LABORATORY STUDIES:  Recent Labs  02/03/13 1835  WBC 16.8*  HGB 13.6  HCT 41.9  PLT 263     Recent Labs  02/03/13 1835  NA 138  K 3.6  CL 104  CO2 23  GLUCOSE 341*  BUN 13  CREATININE 0.54  CALCIUM 8.4    STUDIES/RESULTS:  Dg Chest 1 View  02/03/2013   *RADIOLOGY REPORT*  Clinical Data: Fall.  Preop for left shoulder fracture.  CHEST - 1 VIEW  Comparison: One-view chest 12/07/2011.  Findings: The heart size is at the upper limits of normal.  Mild interstitial coarsening is likely chronic.  No focal airspace disease is evident.  A fracture dislocation is noted at the  left shoulder.  IMPRESSION:  1.  Borderline cardiomegaly without failure. 2.  No acute cardiopulmonary disease. 3.  Fracture dislocation of the left shoulder.   Original Report Authenticated By: Marin Roberts, M.D.   Dg Forearm Left  02/03/2013   *RADIOLOGY REPORT*  Clinical Data: Fall.  Pain.  LEFT FOREARM - 2 VIEW  Comparison: None available.  Findings: The elbow and wrist joints are intact.  No acute bone or soft tissue abnormalities are present.  IMPRESSION: Negative left forearm radiographs.   Original Report Authenticated By: Marin Roberts, M.D.   Dg Shoulder Left  02/03/2013   *RADIOLOGY REPORT*  Clinical Data: Fall. Left shoulder pain.  LEFT SHOULDER - 2+ VIEW  Comparison: None.  Findings: The left shoulder is abducted.  A comminuted fracture is evident.  The humeral head is impacted anteriorly and inferiorly on the glenoid.  IMPRESSION:  1.  Anterior fracture dislocation of the shoulder. 2.  A prominent Hill-Sachs fracture is evident.   Original Report Authenticated By: Marin Roberts, M.D.   Dg Hand Complete Left  02/03/2013   *RADIOLOGY REPORT*  Clinical Data: Wall.  Pain.  LEFT HAND - COMPLETE 3+ VIEW  Comparison: None.  Findings: No acute bone or soft tissue abnormalities are present. No radiopaque foreign body is present.  IMPRESSION: Negative radiographs of the left hand.  Original Report Authenticated By: Marin Roberts, M.D.    ASSESSMENT: Right Shoulder Fracture Dislocation of the Glenohumeral Joint with a Hill Sachs Lesion.   PLAN: Conscious Sedation and Relocation in the ER and follow up in my office next week.    Haeden Hudock,STEPHEN D 02/03/2013. 10:29 PM

## 2013-02-03 NOTE — ED Notes (Signed)
Notified Rigby,Resident, MD. CBG 357.
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
## 2013-02-03 NOTE — ED Notes (Signed)
[id number]<AV> Expected date:<BR> Expected time:<BR> Means of arrival:<BR> Comments:<BR> EMS fall

## 2013-02-03 NOTE — Anesthesia Preprocedure Evaluation (Addendum)
Anesthesia Evaluation  Patient identified by MRN, date of birth, ID band Patient awake    Reviewed: Allergy & Precautions, H&P , NPO status , Patient's Chart, lab work & pertinent test results  Airway Mallampati: II TM Distance: >3 FB Neck ROM: full    Dental  (+) Caps and Dental Advisory Given Left upper front is capped:   Pulmonary neg pulmonary ROS,  breath sounds clear to auscultation  Pulmonary exam normal       Cardiovascular Exercise Tolerance: Good negative cardio ROS  Rhythm:regular Rate:Normal     Neuro/Psych negative neurological ROS  negative psych ROS   GI/Hepatic negative GI ROS, Neg liver ROS, GERD-  Medicated and Controlled,  Endo/Other  negative endocrine ROSdiabetes, Poorly Controlled, Type 2Patient in DKA.  Got insulin to cover in ER a short time ago.  Renal/GU negative Renal ROS  negative genitourinary   Musculoskeletal   Abdominal   Peds  Hematology negative hematology ROS (+)   Anesthesia Other Findings   Reproductive/Obstetrics negative OB ROS                         Anesthesia Physical Anesthesia Plan  ASA: III and emergent  Anesthesia Plan: General   Post-op Pain Management:    Induction: Intravenous  Airway Management Planned: LMA  Additional Equipment:   Intra-op Plan:   Post-operative Plan:   Informed Consent: I have reviewed the patients History and Physical, chart, labs and discussed the procedure including the risks, benefits and alternatives for the proposed anesthesia with the patient or authorized representative who has indicated his/her understanding and acceptance.   Dental Advisory Given  Plan Discussed with: CRNA and Surgeon  Anesthesia Plan Comments:        Anesthesia Quick Evaluation

## 2013-02-03 NOTE — ED Notes (Signed)
Nurse placed pt on 2lpm d/t O2 sat of 88% on RA.
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
## 2013-02-03 NOTE — ED Provider Notes (Signed)
History    CSN: [id number] Arrival date & time 02/03/13  1705  First MD Initiated Contact with Patient 02/03/13 1706     Chief Complaint  Patient presents with  . Fall  . 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 and was unloading a wheelchair bound student when her arm became entrapped in she fell in a twisting motion.  She felt a pop.  She had pain instantly.  She was unable to move her arm and is in a externally rotated and abducted position.  EMS was called and seen they were able to immobilize her shoulder and placed in a c-collar due to left-sided neck pain.  Patient is a 57 y.o. female presenting with fall and shoulder injury.  Fall The accident occurred less than 1 hour ago. Fall occurred: while unloading wheelchair on handicapped equiped school bus. She fell from a height of 1 to 2 ft (did not fall out of bus). The pain is at a severity of 10/10. The pain is severe. There was no entrapment after the fall. There was no alcohol use involved in the accident. Pertinent negatives include no fever, no numbness and no headaches. Exacerbated by: any motion. Treatment on scene includes a c-collar and medications (SAM splint). She has tried immobilization for the symptoms. The treatment provided mild relief.  Shoulder Injury Pertinent negatives include no arthralgias, chest pain, chills, coughing, diaphoresis, fatigue, fever, headaches, joint swelling, myalgias, numbness or weakness.    Past Medical History  Diagnosis Date  . Diabetes mellitus   . Gout   . Acid reflux     Past Surgical History  Procedure Laterality Date  . Pancreatectomy    . Splenectomy      Family History  Problem Relation Age of Onset  . Dementia Mother   . Heart disease Father     History  Substance Use Topics  . Smoking status: Former Games developer  . Smokeless tobacco: Not on file  . Alcohol Use: No    OB History   Grav Para Term Preterm  Abortions TAB SAB Ect Mult Living                  Review of Systems  Constitutional: Negative for fever, chills, diaphoresis, activity change, appetite change, fatigue and unexpected weight change.  HENT: Negative.   Eyes: Negative.   Respiratory: Negative for cough, choking, chest tightness, shortness of breath and wheezing.   Cardiovascular: Negative for chest pain, palpitations and leg swelling.  Gastrointestinal: Negative.   Endocrine: Negative for cold intolerance, heat intolerance, polydipsia, polyphagia and polyuria.  Genitourinary: Negative for urgency, frequency, flank pain, difficulty urinating, menstrual problem and dyspareunia.  Musculoskeletal: Negative for myalgias, back pain, joint swelling, arthralgias and gait problem.  Allergic/Immunologic: Negative.   Neurological: Negative.  Negative for dizziness, tremors, syncope, weakness, light-headedness, numbness and headaches.  Hematological: Negative.   Psychiatric/Behavioral: Negative.     Allergies  Review of patient's allergies indicates no known allergies.  Home Medications   Current Outpatient Rx  Name  Route  Sig  Dispense  Refill  . allopurinol (ZYLOPRIM) 300 MG tablet   Oral   Take 300 mg by mouth at bedtime.          Marland Kitchen aspirin EC 81 MG tablet   Oral   Take 81 mg by mouth at bedtime.          . cholecalciferol (VITAMIN D) 1000 UNITS tablet  Oral   Take 1,000 Units by mouth daily.         . fluconazole (DIFLUCAN) 100 MG tablet   Oral   Take 100 mg by mouth at bedtime. 3 week course of therapy started late April.         . Multiple Vitamin (MULTIVITAMIN WITH MINERALS) TABS   Oral   Take 1 tablet by mouth daily.         Marland Kitchen omeprazole (PRILOSEC) 20 MG capsule   Oral   Take 20 mg by mouth daily.         Marland Kitchen terbinafine (LAMISIL) 250 MG tablet   Oral   Take 250 mg by mouth daily.           BP 115/41  Pulse 78  Temp(Src) 98 F (36.7 C) (Oral)  Resp 16  SpO2 100%  Physical Exam   Nursing note and vitals reviewed. Constitutional: She appears well-developed and well-nourished. She appears distressed.  Arm abducted & externally rotated, in sam splint C - collar in place  HENT:  Head: Normocephalic and atraumatic.  Eyes: Conjunctivae are normal. Right eye exhibits no discharge. Left eye exhibits no discharge. No scleral icterus.  Neck: No JVD present. No tracheal deviation present.  No mid line tenderness  Cardiovascular: Regular rhythm, normal heart sounds and intact distal pulses.  Exam reveals no gallop and no friction rub.   No murmur heard. tachycardic  Pulmonary/Chest: Effort normal and breath sounds normal. No respiratory distress. She has no wheezes. She has no rales.  Abdominal: Soft. She exhibits no distension and no mass. There is no tenderness. There is no rebound and no guarding.  Musculoskeletal:  Left shoulder is abducted, externally rotated and extended above arm.  TTP over humeral head, no pain at elbow, TTP over middle of forearm.  LUE grip strength 5+/5.  Radial and ulnar pulses 2+/4.  Capillary refill <2 seconds.  Sensation intact to light touch. No Midline tenderness of cervical, thoracic or lumbar spine.    Neurological: She is alert. She exhibits normal muscle tone.  Skin: Skin is warm and dry. No rash noted. She is not diaphoretic. No erythema. No pallor.  Psychiatric: She has a normal mood and affect. Her behavior is normal. Judgment and thought content normal.    ED Course  Procedures (including critical care time)  Labs Reviewed  CBC - Abnormal; Notable for the following:    WBC 16.8 (*)    RBC 5.14 (*)    All other components within normal limits  BASIC METABOLIC PANEL - Abnormal; Notable for the following:    Glucose, Bld 341 (*)    All other components within normal limits  GLUCOSE, CAPILLARY - Abnormal; Notable for the following:    Glucose-Capillary 357 (*)    All other components within normal limits   Dg Chest 1  View  02/03/2013   *RADIOLOGY REPORT*  Clinical Data: Fall.  Preop for left shoulder fracture.  CHEST - 1 VIEW  Comparison: One-view chest 12/07/2011.  Findings: The heart size is at the upper limits of normal.  Mild interstitial coarsening is likely chronic.  No focal airspace disease is evident.  A fracture dislocation is noted at the left shoulder.  IMPRESSION:  1.  Borderline cardiomegaly without failure. 2.  No acute cardiopulmonary disease. 3.  Fracture dislocation of the left shoulder.   Original Report Authenticated By: Marin Roberts, M.D.   Dg Forearm Left  02/03/2013   *RADIOLOGY REPORT*  Clinical Data: Fall.  Pain.  LEFT FOREARM - 2 VIEW  Comparison: None available.  Findings: The elbow and wrist joints are intact.  No acute bone or soft tissue abnormalities are present.  IMPRESSION: Negative left forearm radiographs.   Original Report Authenticated By: Marin Roberts, M.D.   Dg Shoulder Left  02/03/2013   *RADIOLOGY REPORT*  Clinical Data: Fall. Left shoulder pain.  LEFT SHOULDER - 2+ VIEW  Comparison: None.  Findings: The left shoulder is abducted.  A comminuted fracture is evident.  The humeral head is impacted anteriorly and inferiorly on the glenoid.  IMPRESSION:  1.  Anterior fracture dislocation of the shoulder. 2.  A prominent Hill-Sachs fracture is evident.   Original Report Authenticated By: Marin Roberts, M.D.   Dg Hand Complete Left  02/03/2013   *RADIOLOGY REPORT*  Clinical Data: Wall.  Pain.  LEFT HAND - COMPLETE 3+ VIEW  Comparison: None.  Findings: No acute bone or soft tissue abnormalities are present. No radiopaque foreign body is present.  IMPRESSION: Negative radiographs of the left hand.   Original Report Authenticated By: Marin Roberts, M.D.     No diagnosis found.    MDM  Pt with entrapment/twisting injury of LUE.  Pt removed from backboard, C collar to remain in place.  Pain at left shoulder, left forearm, and reporting pain radiating into  her neck as well.  Will obtain plain films of arm and CT of neck.  Maintain immobilization until evaluated.    1950 - Pt with anterior Fx-Dislocation of left humerus and prominent Hill-Sacks lesion.  Unable to obtain C spine CT due to arm position; C-Collar to remain in place.  Will consult Dr. Otelia Sergeant (on call for [facility]).  Dr. Otelia Sergeant requests pt go to unassigned given not a previously established.  Consult placed to unassigned ortho (Dr. Sherlean Foot).  Dr. Sherlean Foot recommends attempting bed side reduction in ED.  Will consent for propofol sedation reduction at bedside with Dr. Jeraldine Loots.  2200 - Attempted reduction with propofol sedation - 1.0 +1.5mg /kg (60 + 30mg ) total.  Unable to reduce.  Discussed with Dr. Sherlean Foot who will come in to evaluate the patient.    2245 - Dr. Sherlean Foot attempted additional reduction at bedside but pt was still resisting inspite of 1.5 + 0.5 mg/kg (90 +30mg ) doses of propofol.  Will require OR sedation.  Dr. Valentina Gu has arranged with OR.  Will give Novolog 5 Units given elevated CBG.    Andrena Mews, DO 02/03/13 2312

## 2013-02-03 NOTE — ED Notes (Signed)
Ortho tech called 

## 2013-02-04 ENCOUNTER — Emergency Department (HOSPITAL_COMMUNITY): Payer: Worker's Compensation | Admitting: Anesthesiology

## 2013-02-04 ENCOUNTER — Encounter (HOSPITAL_COMMUNITY): Payer: Self-pay | Admitting: Anesthesiology

## 2013-02-04 ENCOUNTER — Encounter (HOSPITAL_COMMUNITY): Payer: Self-pay | Admitting: Orthopedic Surgery

## 2013-02-04 ENCOUNTER — Emergency Department (HOSPITAL_COMMUNITY): Payer: Worker's Compensation

## 2013-02-04 DIAGNOSIS — S42293A Other displaced fracture of upper end of unspecified humerus, initial encounter for closed fracture: Secondary | ICD-10-CM | POA: Diagnosis not present

## 2013-02-04 MED ORDER — HYDROMORPHONE HCL PF 1 MG/ML IJ SOLN: 0.2500 mg | INTRAMUSCULAR | Status: DC | PRN

## 2013-02-04 MED ORDER — METOCLOPRAMIDE HCL 5 MG/ML IJ SOLN
INTRAMUSCULAR | Status: DC | PRN
  Administered 2013-02-04: 10 mg via INTRAVENOUS

## 2013-02-04 MED ORDER — LACTATED RINGERS IV SOLN: INTRAVENOUS | Status: DC

## 2013-02-04 MED ORDER — PROPOFOL 10 MG/ML IV BOLUS
INTRAVENOUS | Status: DC | PRN
  Administered 2013-02-04: 200 mg via INTRAVENOUS

## 2013-02-04 MED ORDER — SODIUM CHLORIDE 0.9 % IV SOLN
INTRAVENOUS | Status: DC | PRN
  Administered 2013-02-03: via INTRAVENOUS

## 2013-02-04 MED ORDER — LIDOCAINE HCL (CARDIAC) 10 MG/ML IV SOLN
INTRAVENOUS | Status: DC | PRN
  Administered 2013-02-04: 100 mg via INTRAVENOUS

## 2013-02-04 MED ORDER — ONDANSETRON HCL 4 MG/2ML IJ SOLN
INTRAMUSCULAR | Status: DC | PRN
  Administered 2013-02-04: 4 mg via INTRAVENOUS

## 2013-02-04 MED ORDER — FENTANYL CITRATE 0.05 MG/ML IJ SOLN
INTRAMUSCULAR | Status: DC | PRN
  Administered 2013-02-03: 100 ug via INTRAVENOUS

## 2013-02-04 MED ORDER — MIDAZOLAM HCL 5 MG/5ML IJ SOLN
INTRAMUSCULAR | Status: DC | PRN
  Administered 2013-02-04: 2 mg via INTRAVENOUS

## 2013-02-04 NOTE — Transfer of Care (Signed)
Immediate Anesthesia Transfer of Care Note  Patient: [name]  Procedure(s) Performed: Procedure(s) with comments: CLOSED MANIPULATION SHOULDER (Left) - CLOSED MANIPULATION LEFT SHOULDER  Patient Location: PACU  Anesthesia Type:General  Level of Consciousness: awake, alert , oriented, patient cooperative and responds to stimulation  Airway & Oxygen Therapy: Patient Spontanous Breathing and Patient connected to face mask oxygen  Post-op Assessment: Report given to PACU RN, Post -op Vital signs reviewed and stable and Patient moving all extremities X 4  Post vital signs: stable  Complications: No apparent anesthesia complications

## 2013-02-04 NOTE — Op Note (Signed)
NAMETYYONNA, SOUCY                ACCOUNT NO.:  [id number]  MEDICAL RECORD NO.:  [id number]  LOCATION:  [facility]                         FACILITY:  [facility]  PHYSICIAN:  Mila Homer. Sherlean Foot, M.D. DATE OF BIRTH:  March 21, 1956  DATE OF PROCEDURE:  02/04/2013 DATE OF DISCHARGE:  02/04/2013                              OPERATIVE REPORT   SURGEON:  Mila Homer. Sherlean Foot, M.D.  ASSISTANT:  None.  ANESTHESIA:  General.  PREOPERATIVE DIAGNOSIS:  Left fracture dislocation of the shoulder.  POSTOPERATIVE DIAGNOSIS:  Left fracture dislocation of the shoulder.  PROCEDURE:  Left closed reduction of a fracture dislocation of the shoulder.  INDICATION OF THE PROCEDURE:  The patient is a 57 year old white female who fell and had 2 failed attempts of closed reduction under conscious sedation in the ER and was brought to the operating room under informed consent.  DESCRIPTION OF THE PROCEDURE:  The patient was laid supine, administered general anesthesia, standard reduction procedure was performed and easily got the shoulder in place.  Took AP and axillary views documenting that on the C-arm.  I then put a sling and swathe in place, took one further view to ensure __________ in place, which it had.  The patient then awakened, taken  to the recovery room in stable condition.          ______________________________ Mila Homer Sherlean Foot, M.D.     SDL/MEDQ  D:  02/04/2013  T:  02/04/2013  Job:  [id number]

## 2013-02-04 NOTE — Op Note (Signed)
Dictation Number: [id number]

## 2013-02-04 NOTE — Transfer of Care (Deleted)
Immediate Anesthesia Transfer of Care Note  Patient: [name]  Procedure(s) Performed: Procedure(s) with comments: CLOSED MANIPULATION SHOULDER (Left) - CLOSED MANIPULATION LEFT SHOULDER  Patient Location: PACU  Anesthesia Type:General  Level of Consciousness: awake, alert , oriented and patient cooperative  Airway & Oxygen Therapy: Patient Spontanous Breathing and Patient connected to face mask oxygen  Post-op Assessment: Report given to PACU RN, Post -op Vital signs reviewed and stable and Patient moving all extremities X 4  Post vital signs: stable  Complications: No apparent anesthesia complications

## 2013-02-16 NOTE — Anesthesia Postprocedure Evaluation (Signed)
  Anesthesia Post-op Note  Patient: [name]  Procedure(s) Performed: Procedure(s) (LRB): CLOSED MANIPULATION SHOULDER (Left)  Patient Location: PACU  Anesthesia Type: General  Level of Consciousness: awake and alert   Airway and Oxygen Therapy: Patient Spontanous Breathing  Post-op Pain: mild  Post-op Assessment: Post-op Vital signs reviewed, Patient's Cardiovascular Status Stable, Respiratory Function Stable, Patent Airway and No signs of Nausea or vomiting  Last Vitals:  Filed Vitals:   02/04/13 0115  BP:   Pulse:   Temp: 36.6 C  Resp:     Post-op Vital Signs: stable   Complications: No apparent anesthesia complications

## 2013-07-26 ENCOUNTER — Encounter: Payer: Self-pay | Admitting: Internal Medicine

## 2013-08-02 ENCOUNTER — Ambulatory Visit: Payer: Self-pay | Admitting: Emergency Medicine

## 2013-08-09 ENCOUNTER — Other Ambulatory Visit: Payer: Self-pay | Admitting: Internal Medicine

## 2013-09-08 ENCOUNTER — Ambulatory Visit: Payer: Self-pay | Admitting: Physician Assistant

## 2013-10-23 ENCOUNTER — Other Ambulatory Visit: Payer: Self-pay | Admitting: Emergency Medicine

## 2013-10-26 ENCOUNTER — Other Ambulatory Visit: Payer: Self-pay | Admitting: Emergency Medicine

## 2013-11-12 ENCOUNTER — Encounter: Payer: Self-pay | Admitting: Emergency Medicine

## 2013-11-12 ENCOUNTER — Ambulatory Visit (INDEPENDENT_AMBULATORY_CARE_PROVIDER_SITE_OTHER): Payer: BC Managed Care – PPO | Admitting: Emergency Medicine

## 2013-11-12 VITALS — BP 128/84 | HR 76 | Temp 98.0°F | Resp 18 | Ht 60.0 in | Wt 135.0 lb

## 2013-11-12 DIAGNOSIS — J329 Chronic sinusitis, unspecified: Secondary | ICD-10-CM

## 2013-11-12 DIAGNOSIS — I10 Essential (primary) hypertension: Secondary | ICD-10-CM

## 2013-11-12 DIAGNOSIS — E559 Vitamin D deficiency, unspecified: Secondary | ICD-10-CM

## 2013-11-12 DIAGNOSIS — E1149 Type 2 diabetes mellitus with other diabetic neurological complication: Secondary | ICD-10-CM

## 2013-11-12 DIAGNOSIS — E782 Mixed hyperlipidemia: Secondary | ICD-10-CM

## 2013-11-12 DIAGNOSIS — D229 Melanocytic nevi, unspecified: Secondary | ICD-10-CM

## 2013-11-12 DIAGNOSIS — R5383 Other fatigue: Secondary | ICD-10-CM

## 2013-11-12 DIAGNOSIS — K219 Gastro-esophageal reflux disease without esophagitis: Secondary | ICD-10-CM

## 2013-11-12 DIAGNOSIS — Z79899 Other long term (current) drug therapy: Secondary | ICD-10-CM

## 2013-11-12 DIAGNOSIS — R5381 Other malaise: Secondary | ICD-10-CM

## 2013-11-12 LAB — LIPID PANEL
CHOL/HDL RATIO: 3.9 ratio
CHOLESTEROL: 197 mg/dL (ref 0–200)
HDL: 51 mg/dL (ref >39)
LDL Cholesterol: 121 mg/dL — ABNORMAL HIGH (ref 0–99)
Triglycerides: 127 mg/dL (ref <150)
VLDL: 25 mg/dL (ref 0–40)

## 2013-11-12 LAB — BASIC METABOLIC PANEL WITH GFR
BUN: 10 mg/dL (ref 6–23)
CALCIUM: 9.3 mg/dL (ref 8.4–10.5)
CO2: 29 mEq/L (ref 19–32)
CREATININE: 0.56 mg/dL (ref 0.50–1.10)
Chloride: 100 mEq/L (ref 96–112)
GLUCOSE: 197 mg/dL — AB (ref 70–99)
POTASSIUM: 4.1 meq/L (ref 3.5–5.3)
Sodium: 138 mEq/L (ref 135–145)

## 2013-11-12 LAB — MAGNESIUM: MAGNESIUM: 1.6 mg/dL (ref 1.5–2.5)

## 2013-11-12 LAB — CBC WITH DIFFERENTIAL/PLATELET
BASOS ABS: 0.1 10*3/uL (ref 0.0–0.1)
BASOS PCT: 1 % (ref 0–1)
EOS ABS: 0.2 10*3/uL (ref 0.0–0.7)
EOS PCT: 2 % (ref 0–5)
HEMATOCRIT: 42.9 % (ref 36.0–46.0)
Hemoglobin: 13.6 g/dL (ref 12.0–15.0)
LYMPHS PCT: 41 % (ref 12–46)
Lymphs Abs: 3.7 10*3/uL (ref 0.7–4.0)
MCH: 26.4 pg (ref 26.0–34.0)
MCHC: 31.7 g/dL (ref 30.0–36.0)
MCV: 83.3 fL (ref 78.0–100.0)
MONO ABS: 0.6 10*3/uL (ref 0.1–1.0)
Monocytes Relative: 7 % (ref 3–12)
Neutro Abs: 4.5 10*3/uL (ref 1.7–7.7)
Neutrophils Relative %: 49 % (ref 43–77)
PLATELETS: 314 10*3/uL (ref 150–400)
RBC: 5.15 MIL/uL — ABNORMAL HIGH (ref 3.87–5.11)
RDW: 14.6 % (ref 11.5–15.5)
WBC: 9.1 10*3/uL (ref 4.0–10.5)

## 2013-11-12 LAB — TSH: TSH: 2.841 u[IU]/mL (ref 0.350–4.500)

## 2013-11-12 LAB — HEPATIC FUNCTION PANEL
ALBUMIN: 4.1 g/dL (ref 3.5–5.2)
ALK PHOS: 108 U/L (ref 39–117)
ALT: 14 U/L (ref 0–35)
AST: 17 U/L (ref 0–37)
BILIRUBIN DIRECT: 0.1 mg/dL (ref 0.0–0.3)
BILIRUBIN TOTAL: 0.3 mg/dL (ref 0.2–1.2)
Indirect Bilirubin: 0.2 mg/dL (ref 0.2–1.2)
Total Protein: 6.5 g/dL (ref 6.0–8.3)

## 2013-11-12 LAB — HEMOGLOBIN A1C
Hgb A1c MFr Bld: 17.6 % — ABNORMAL HIGH (ref <5.7)
Mean Plasma Glucose: 458 mg/dL — ABNORMAL HIGH (ref <117)

## 2013-11-12 MED ORDER — ALBUTEROL SULFATE HFA 108 (90 BASE) MCG/ACT IN AERS: 2.0000 | INHALATION_SPRAY | Freq: Four times a day (QID) | RESPIRATORY_TRACT | Status: DC | PRN

## 2013-11-12 MED ORDER — FLUCONAZOLE 100 MG PO TABS: ORAL_TABLET | ORAL | Status: DC

## 2013-11-12 MED ORDER — LEVOFLOXACIN 500 MG PO TABS: 500.0000 mg | ORAL_TABLET | Freq: Every day | ORAL | Status: AC

## 2013-11-12 MED ORDER — BENZONATATE 100 MG PO CAPS: 100.0000 mg | ORAL_CAPSULE | Freq: Three times a day (TID) | ORAL | Status: DC | PRN

## 2013-11-12 NOTE — Patient Instructions (Addendum)
Allergic Rhinitis ADD Allegra and Flonase Allergic rhinitis is when the mucous membranes in the nose respond to allergens. Allergens are particles in the air that cause your body to have an allergic reaction. This causes you to release allergic antibodies. Through a chain of events, these eventually cause you to release histamine into the blood stream. Although meant to protect the body, it is this release of histamine that causes your discomfort, such as frequent sneezing, congestion, and an itchy, runny nose.  CAUSES  Seasonal allergic rhinitis (hay fever) is caused by pollen allergens that may come from grasses, trees, and weeds. Year-round allergic rhinitis (perennial allergic rhinitis) is caused by allergens such as house dust mites, pet dander, and mold spores.  SYMPTOMS   Nasal stuffiness (congestion).  Itchy, runny nose with sneezing and tearing of the eyes. DIAGNOSIS  Your health care provider can help you determine the allergen or allergens that trigger your symptoms. If you and your health care provider are unable to determine the allergen, skin or blood testing may be used. TREATMENT  Allergic Rhinitis does not have a cure, but it can be controlled by:  Medicines and allergy shots (immunotherapy).  Avoiding the allergen. Hay fever may often be treated with antihistamines in pill or nasal spray forms. Antihistamines block the effects of histamine. There are over-the-counter medicines that may help with nasal congestion and swelling around the eyes. Check with your health care provider before taking or giving this medicine.  If avoiding the allergen or the medicine prescribed do not work, there are many new medicines your health care provider can prescribe. Stronger medicine may be used if initial measures are ineffective. Desensitizing injections can be used if medicine and avoidance does not work. Desensitization is when a patient is given ongoing shots until the body becomes less  sensitive to the allergen. Make sure you follow up with your health care provider if problems continue. HOME CARE INSTRUCTIONS It is not possible to completely avoid allergens, but you can reduce your symptoms by taking steps to limit your exposure to them. It helps to know exactly what you are allergic to so that you can avoid your specific triggers. SEEK MEDICAL CARE IF:   You have a fever.  You develop a cough that does not stop easily (persistent).  You have shortness of breath.  You start wheezing.  Symptoms interfere with normal daily activities. Document Released: 06/04/2001 Document Revised: 06/30/2013 Document Reviewed: 05/17/2013 [facility] Patient Information 2014 [place].   Bad carbs also include fruit juice, alcohol, and sweet tea. These are empty calories that do not signal to your brain that you are full.   Please remember the good carbs are still carbs which convert into sugar. So please measure them out no more than 1/2-1 cup of rice, oatmeal, pasta, and beans.  Veggies are however free foods! Pile them on.   I like lean protein at every meal such as chicken, [place], pork chops, cottage cheese, etc. Just do not fry these meats and please center your meal around vegetable, the meats should be a side dish.   No all fruit is created equal. Please see the list below, the fruit at the bottom is higher in sugars than the fruit at the top  We want weight loss that will last so you should lose 1-2 pounds a week.  THAT IS IT! Please pick THREE things a month to change. Once it is a habit check off the item. Then pick another three items  off the list to become habits.  If you are already doing a habit on the list GREAT!  Cross that item off! o Don't drink your calories. Ie, alcohol, soda, fruit juice, and sweet tea.  o Drink more water. Drink a glass when you feel hungry or before each meal.  o Eat breakfast - Complex carb and protein (likeDannon light and fit yogurt,  oatmeal, fruit, eggs, [place] bacon). o Measure your cereal.  Eat no more than one cup a day. (ie [place]) o Eat an apple a day. o Add a vegetable a day. o Try a new vegetable a month. o Use Pam! Stop using oil or butter to cook. o Don't finish your plate or use smaller plates. o Share your dessert. o Eat sugar free Jello for dessert or frozen grapes. o Don't eat 2-3 hours before bed. o Switch to whole wheat bread, pasta, and brown rice. o Make healthier choices when you eat out. No fries! o Pick baked chicken, NOT fried. o Don't forget to SLOW DOWN when you eat. It is not going anywhere.  o Take the stairs. o Park far away in the parking lot o [employer] (or weights) for 10 minutes while watching TV. o Walk at work for 10 minutes during break. o Walk outside 1 time a week with your friend, kids, dog, or significant other. o Start a walking group at [place] the mall as much as you can tolerate.  o Keep a food diary. o Weigh yourself daily. o Walk for 15 minutes 3 days per week. o Cook at home more often and eat out less.  If life happens and you go back to old habits, it is okay.  Just start over. You can do it!   If you experience chest pain, get short of breath, or tired during the exercise, please stop immediately and inform your doctor.

## 2013-11-12 NOTE — Progress Notes (Addendum)
Subjective:    Patient ID: [name], female    DOB: 12-May-1956, 58 y.o.   MRN: [id number]  HPI Comments: 58 yo female that is overdue for labs and is noncompliant with DM RX. She presents for 3 month F/U for HTN, Cholesterol, noncompliant DM, D. Deficient.She notes she has not been following up because of family tragedy. She has lost 25 lbs since last OV. She notes BS has been up and down because no money for RX AND SHE HAS BEEN SKIPPING DOSES OF MF AND INSULIN. She has had to file bankruptcy. She is trying to eat better. She is not exercising with cold weather. She keeps close check on feet with mild increased cold sensation. LAST LABS T 213 TG 157 L 118 A1C 12 D 74 MAG 2  She recently went to Urgent care and was treated for Influenza with Tamiflu and cough syrup. She is congested and producing mucus yellow green. Cough is dry and hard to stop.   She has been fatigued but feels that is related to recent stress with family tragedies. She notes she is sleeping well.  Cough  Sinusitis Associated symptoms include congestion, coughing and sinus pressure.   Current Outpatient Prescriptions on File Prior to Visit  Medication Sig Dispense Refill  . aspirin EC 81 MG tablet Take 81 mg by mouth at bedtime.       . cholecalciferol (VITAMIN D) 1000 UNITS tablet Take 1,000 Units by mouth daily.      . fluconazole (DIFLUCAN) 100 MG tablet TAKE ONE TABLET BY MOUTH EVERY DAY OR AS DIRECTED  30 tablet  0  . insulin NPH (HUMULIN N,NOVOLIN N) 100 UNIT/ML injection Inject into the skin. 30 UNITS A.M.  10 UNITS P.M.      . metFORMIN (GLUCOPHAGE) 500 MG tablet Take 500 mg by mouth. TID      . Multiple Vitamin (MULTIVITAMIN WITH MINERALS) TABS Take 1 tablet by mouth daily.      Marland Kitchen omeprazole (PRILOSEC) 20 MG capsule Take 20 mg by mouth daily.      Marland Kitchen terbinafine (LAMISIL) 250 MG tablet Take 250 mg by mouth daily.      . vitamin B-12 (CYANOCOBALAMIN) 100 MCG tablet Take 50 mcg by mouth daily.       No  current facility-administered medications on file prior to visit.   Allergies  Allergen Reactions  . Pepcid [Famotidine] Other (See Comments)    HEADACHE  . Prednisone Hives  . Prevacid [Lansoprazole] Diarrhea   Past Medical History  Diagnosis Date  . Diabetes mellitus   . Gout   . Acid reflux   . Hypertension   . [place]      Review of Systems  Constitutional: Positive for fatigue.  HENT: Positive for congestion and sinus pressure.   Respiratory: Positive for cough.   Skin: Positive for color change.  All other systems reviewed and are negative.   BP 128/84  Pulse 76  Temp(Src) 98 F (36.7 C) (Temporal)  Resp 18  Ht 5' (1.524 m)  Wt 135 lb (61.236 kg)  BMI 26.37 kg/m2     Objective:   Physical Exam  Nursing note and vitals reviewed. Constitutional: She is oriented to person, place, and time. She appears well-developed and well-nourished. No distress.  HENT:  Head: Normocephalic and atraumatic.  Right Ear: External ear normal.  Left Ear: External ear normal.  Nose: Nose normal.  Mouth/Throat: Oropharynx is clear and moist. No oropharyngeal  exudate.  Yellow TMs bilateral Maxillary tenderness   Eyes: Conjunctivae and EOM are normal.  Neck: Normal range of motion. Neck supple. No JVD present. No thyromegaly present.  Cardiovascular: Normal rate, regular rhythm, normal heart sounds and intact distal pulses.   Pulmonary/Chest: Effort normal and breath sounds normal.  Congested Breath sounds, clears some with cough   Abdominal: Soft. Bowel sounds are normal. She exhibits no distension and no mass. There is no tenderness. There is no rebound and no guarding.  Musculoskeletal: Normal range of motion. She exhibits no edema and no tenderness.  Lymphadenopathy:    She has no cervical adenopathy.  Neurological: She is alert and oriented to person, place, and time. No cranial nerve deficit.  Skin: Skin is warm and dry. No rash noted. No erythema. No  pallor.     Left lower abdomen 2-18mm flat dark brown areas x 2 Right mid/ low back 2-41mm flat dark brown Right low back reoccurrence of 79mm dark irreg flat nevi FEET-Dry scaling at borders with minimal callus at heels   Psychiatric: She has a normal mood and affect. Her behavior is normal. Judgment and thought content normal.          Assessment & Plan:  1.  3 month F/U for HTN, Cholesterol,DM, D. Deficient. Needs healthy diet, cardio QD and obtain healthy weight. Check Labs, Check BP if >130/80 call office, Check BS if >200 call office. ADVISED Increased risk for heart attack/ stroke/ blindness/ kidney failure/ cancer and many more complications with horrible labs in the past and noncompliance currently. 2.  Irreg Nevi- monitor for any change, call for removal or refer DERM 3. Sinusitis/ Cough/ Allergic rhinitis- Allegra OTC, increase H2o, allergy hygiene explained. Levaquin 500 mg AD, Albuterol HFA AD, Tessalon perles AD.  4. Fatigue- check labs, increase activity and H2O OVER 40 minutes of exam, counseling, chart review, referral discussion performed

## 2013-11-13 LAB — VITAMIN D 25 HYDROXY (VIT D DEFICIENCY, FRACTURES): Vit D, 25-Hydroxy: 65 ng/mL (ref 30–89)

## 2013-11-14 ENCOUNTER — Encounter: Payer: Self-pay | Admitting: Emergency Medicine

## 2013-11-14 DIAGNOSIS — E782 Mixed hyperlipidemia: Secondary | ICD-10-CM

## 2013-11-14 DIAGNOSIS — I10 Essential (primary) hypertension: Secondary | ICD-10-CM | POA: Insufficient documentation

## 2013-11-14 DIAGNOSIS — K219 Gastro-esophageal reflux disease without esophagitis: Secondary | ICD-10-CM | POA: Insufficient documentation

## 2013-11-14 HISTORY — DX: Mixed hyperlipidemia: E78.2

## 2013-11-14 HISTORY — DX: Gastro-esophageal reflux disease without esophagitis: K21.9

## 2013-12-13 ENCOUNTER — Other Ambulatory Visit: Payer: Self-pay | Admitting: Internal Medicine

## 2013-12-20 ENCOUNTER — Other Ambulatory Visit: Payer: Self-pay | Admitting: Internal Medicine

## 2013-12-20 DIAGNOSIS — Z1231 Encounter for screening mammogram for malignant neoplasm of breast: Secondary | ICD-10-CM

## 2013-12-24 ENCOUNTER — Ambulatory Visit (HOSPITAL_COMMUNITY)
Admission: RE | Admit: 2013-12-24 | Discharge: 2013-12-24 | Disposition: A | Discharge status: Home / Self Care | Payer: BC Managed Care – PPO | Source: Ambulatory Visit | Attending: Internal Medicine | Admitting: Internal Medicine

## 2013-12-24 ENCOUNTER — Other Ambulatory Visit: Payer: Self-pay | Admitting: Emergency Medicine

## 2013-12-24 ENCOUNTER — Ambulatory Visit (HOSPITAL_COMMUNITY)
Admission: RE | Admit: 2013-12-24 | Discharge: 2013-12-24 | Disposition: A | Discharge status: Home / Self Care | Payer: BC Managed Care – PPO | Source: Ambulatory Visit | Attending: Emergency Medicine | Admitting: Emergency Medicine

## 2013-12-24 DIAGNOSIS — Z13820 Encounter for screening for osteoporosis: Secondary | ICD-10-CM | POA: Insufficient documentation

## 2013-12-24 DIAGNOSIS — Z1231 Encounter for screening mammogram for malignant neoplasm of breast: Secondary | ICD-10-CM | POA: Insufficient documentation

## 2013-12-24 DIAGNOSIS — Z780 Asymptomatic menopausal state: Secondary | ICD-10-CM

## 2014-01-06 ENCOUNTER — Ambulatory Visit: Payer: Self-pay | Admitting: Internal Medicine

## 2014-02-08 ENCOUNTER — Ambulatory Visit: Payer: Self-pay | Admitting: Internal Medicine

## 2014-06-20 ENCOUNTER — Ambulatory Visit (INDEPENDENT_AMBULATORY_CARE_PROVIDER_SITE_OTHER): Payer: BC Managed Care – PPO | Admitting: *Deleted

## 2014-06-20 DIAGNOSIS — Z23 Encounter for immunization: Secondary | ICD-10-CM

## 2014-06-20 NOTE — Progress Notes (Signed)
Patient ID: [name], female   DOB: Mar 13, 1956, 58 y.o.   MRN: [id number] Patient came in today for a flu shot.  Fluvirin 0.5 ml give in left deltoid. Patient tolerated well.

## 2014-07-18 ENCOUNTER — Encounter: Payer: Self-pay | Admitting: Internal Medicine

## 2014-09-07 ENCOUNTER — Ambulatory Visit (INDEPENDENT_AMBULATORY_CARE_PROVIDER_SITE_OTHER): Payer: BC Managed Care – PPO | Admitting: Internal Medicine

## 2014-09-07 ENCOUNTER — Encounter: Payer: Self-pay | Admitting: Internal Medicine

## 2014-09-07 VITALS — BP 106/72 | HR 84 | Temp 97.7°F | Resp 16 | Ht 61.0 in | Wt 127.2 lb

## 2014-09-07 DIAGNOSIS — E782 Mixed hyperlipidemia: Secondary | ICD-10-CM

## 2014-09-07 DIAGNOSIS — J042 Acute laryngotracheitis: Secondary | ICD-10-CM

## 2014-09-07 DIAGNOSIS — E119 Type 2 diabetes mellitus without complications: Secondary | ICD-10-CM | POA: Insufficient documentation

## 2014-09-07 DIAGNOSIS — I10 Essential (primary) hypertension: Secondary | ICD-10-CM

## 2014-09-07 DIAGNOSIS — Z794 Long term (current) use of insulin: Secondary | ICD-10-CM

## 2014-09-07 DIAGNOSIS — E109 Type 1 diabetes mellitus without complications: Secondary | ICD-10-CM

## 2014-09-07 DIAGNOSIS — Z79899 Other long term (current) drug therapy: Secondary | ICD-10-CM

## 2014-09-07 DIAGNOSIS — E559 Vitamin D deficiency, unspecified: Secondary | ICD-10-CM

## 2014-09-07 LAB — CBC WITH DIFFERENTIAL/PLATELET
Basophils Absolute: 0.1 10*3/uL (ref 0.0–0.1)
Basophils Relative: 1 % (ref 0–1)
EOS ABS: 0.3 10*3/uL (ref 0.0–0.7)
Eosinophils Relative: 2 % (ref 0–5)
HEMATOCRIT: 45.7 % (ref 36.0–46.0)
HEMOGLOBIN: 14.2 g/dL (ref 12.0–15.0)
LYMPHS ABS: 4.7 10*3/uL — AB (ref 0.7–4.0)
Lymphocytes Relative: 37 % (ref 12–46)
MCH: 26.6 pg (ref 26.0–34.0)
MCHC: 31.1 g/dL (ref 30.0–36.0)
MCV: 85.6 fL (ref 78.0–100.0)
MONO ABS: 0.6 10*3/uL (ref 0.1–1.0)
MONOS PCT: 5 % (ref 3–12)
MPV: 12.4 fL (ref 9.4–12.4)
NEUTROS PCT: 55 % (ref 43–77)
Neutro Abs: 6.9 10*3/uL (ref 1.7–7.7)
Platelets: 326 10*3/uL (ref 150–400)
RBC: 5.34 MIL/uL — ABNORMAL HIGH (ref 3.87–5.11)
RDW: 14.2 % (ref 11.5–15.5)
WBC: 12.6 10*3/uL — ABNORMAL HIGH (ref 4.0–10.5)

## 2014-09-07 MED ORDER — AZITHROMYCIN 250 MG PO TABS: ORAL_TABLET | ORAL | Status: DC

## 2014-09-07 MED ORDER — AZITHROMYCIN 250 MG PO TABS: ORAL_TABLET | ORAL | Status: AC

## 2014-09-07 NOTE — Progress Notes (Signed)
Patient ID: [name], female   DOB: 07-10-1956, 58 y.o.   MRN: [id number]   This very nice 58 y.o.MWF presents for 3 month follow up with Hypertension, Hyperlipidemia, T2_NIDDM and Vitamin D Deficiency. Patient presents now from lost to f/u and apparently been off of her both Metformin & insulin reporting that she & husb are filing bankruptcy and has been unable to afford her medications. Likewise she has not been monitoring her CBG's. \   Today she presents for evaluation/treatment of an upper and lower respiratory infection. Denies fever , chills or sweatws, but reports head & chest congestion.   Patient is treated for HTN & BP has been controlled at home. Today's BP: 106/72 mmHg. Patient has had no complaints of any cardiac type chest pain, palpitations, dyspnea/orthopnea/PND, dizziness, claudication, or dependent edema.   Hyperlipidemia is controlled with diet & meds. Patient denies myalgias or other med SE's. Last Lipids were not at goal - Total Chol 197; HDL  51; LDL 121*; Triglycerides 127 on 11/12/2013.   Also, the patient has history of T2_NIDDM and despite being off therapy she's has had no symptoms of reactive hypoglycemia, diabetic polys, paresthesias or visual blurring.  Last A1c was  17.6% on  11/12/2013.   Further, the patient also has history of Vitamin D Deficiency and supplements vitamin D without any suspected side-effects. Last vitamin D was  65 on 11/12/2013.    Medication List   azithromycin 250 MG tablet  Commonly known as:  ZITHROMAX  Take 2 tablets (500 mg) on  Day 1,  followed by 1 tablet (250 mg) once daily on Days 2 through 5.     cholecalciferol 1000 UNITS tablet  Commonly known as:  VITAMIN D  Take 1,000 Units by mouth daily.     fluconazole 100 MG tablet  Commonly known as:  DIFLUCAN  TAKE ONE TABLET BY MOUTH EVERY DAY OR AS DIRECTED     freestyle lancets  CHECK GLUCOSE THREE TIMES DAILY AS DIRECTED     FREESTYLE LITE test strip  Generic drug:  glucose  blood  USE TO CHECK BLOOD SUGAR THREE TIMES A DAY     GLUCOPHAGE 1000 MG tablet  Generic drug:  metFORMIN  Take 500 mg by mouth 3 (three) times daily.     insulin NPH Human 100 UNIT/ML injection  Commonly known as:  HUMULIN N,NOVOLIN N  - Inject into the skin. 30 UNITS A.M.  - 10 UNITS P.M.     multivitamin with minerals Tabs tablet  Take 1 tablet by mouth daily.     omeprazole 20 MG capsule  Commonly known as:  PRILOSEC  Take 20 mg by mouth daily.     SUPER B COMPLEX PO  Take 1 tablet by mouth daily.     Allergies  Allergen Reactions  . Pepcid [Famotidine] Other (See Comments)    HEADACHE  . Prevacid [Lansoprazole] Diarrhea   PMHx:   Past Medical History  Diagnosis Date  . Diabetes mellitus   . Gout   . Acid reflux   . Hypertension   . Cancer 1977    CERVICAL  . GERD (gastroesophageal reflux disease) 11/14/2013  . Unspecified essential hypertension 11/14/2013  . Mixed hyperlipidemia 11/14/2013   Immunization History  Administered Date(s) Administered  . Influenza Split 06/20/2014  . Influenza Whole 06/17/2013  . Pneumococcal-Unspecified 09/23/2009  . Tdap 09/23/2006   Past Surgical History  Procedure Laterality Date  . Pancreatectomy    . Splenectomy    .  Shoulder closed reduction Left 02/03/2013    Procedure: CLOSED MANIPULATION SHOULDER;  Surgeon: Vickey Huger, MD;  Location: [facility];  Service: Orthopedics;  Laterality: Left;  CLOSED MANIPULATION LEFT SHOULDER  . Tonsillectomy  1970  . Abdominal hysterectomy  1977    CERVICAL CA  . Cholecystectomy    . Eye surgery      6/3 LASER FOR GLAUCOMA, 9/03 CE/IOL IMPLANTS   FHx:    Reviewed / unchanged  SHx:    Reviewed / unchanged  Systems Review:  Constitutional: Denies fever, chills, wt changes, headaches, insomnia, fatigue, night sweats, change in appetite. Eyes: Denies redness, blurred vision, diplopia, discharge, itchy, watery eyes.  ENT: Denies discharge, congestion, post nasal drip, epistaxis, sore  throat, earache, hearing loss, dental pain, tinnitus, vertigo, sinus pain, snoring.  CV: Denies chest pain, palpitations, irregular heartbeat, syncope, dyspnea, diaphoresis, orthopnea, PND, claudication or edema. Respiratory: denies cough, dyspnea, DOE, pleurisy, hoarseness, laryngitis, wheezing.  Gastrointestinal: Denies dysphagia, odynophagia, heartburn, reflux, water brash, abdominal pain or cramps, nausea, vomiting, bloating, diarrhea, constipation, hematemesis, melena, hematochezia  or hemorrhoids. Genitourinary: Denies dysuria, frequency, urgency, nocturia, hesitancy, discharge, hematuria or flank pain. Musculoskeletal: Denies arthralgias, myalgias, stiffness, jt. swelling, pain, limping or strain/sprain.  Skin: Denies pruritus, rash, hives, warts, acne, eczema or change in skin lesion(s). Neuro: No weakness, tremor, incoordination, spasms, paresthesia or pain. Psychiatric: Denies confusion, memory loss or sensory loss. Endo: Denies change in weight, skin or hair change.  Heme/Lymph: No excessive bleeding, bruising or enlarged lymph nodes.  Physical Exam  BP 106/72    Pulse 84  Temp 97.7 F   Resp 16  Ht 5\' 1"    Wt 127 lb 3.2 oz        BMI 24.05 kg/m2    O2 Sat 95%   Appears well nourished and in no respiratory distress. Hoarse. Brassy congested cough  Eyes: PERRLA, EOMs, conjunctiva no swelling or erythema. Sinuses: No frontal/maxillary tenderness ENT/Mouth: EAC's clear, TM's nl w/o erythema, bulging. Nares clear w/o erythema, swelling, exudates. Oropharynx clear without erythema or exudates. Oral hygiene is good. Tongue normal, non obstructing. Hearing intact.  Neck: Supple. Thyroid nl. Car 2+/2+ without bruits, nodes or JVD. Chest: Few scattered coarse rales & rhonchi - no wheezes. Cor: Heart sounds normal w/ regular rate and rhythm without sig. murmurs, gallops, clicks, or rubs. Peripheral pulses normal and equal  without edema.  Abdomen: Soft & bowel sounds normal.  Non-tender w/o guarding, rebound, hernias, masses, or organomegaly.  Lymphatics: Unremarkable.  Musculoskeletal: Full ROM all peripheral extremities, joint stability, 5/5 strength, and normal gait.  Skin: Warm, dry without exposed rashes, lesions or ecchymosis apparent.  Neuro: Cranial nerves intact, reflexes equal bilaterally. Sensory-motor testing grossly intact. Tendon reflexes grossly intact.  Pysch: Alert & oriented x 3.  Insight and judgement nl & appropriate. No ideations.  Assessment and Plan:  1. Hypertension - Continue monitor blood pressure at home. Continue diet/meds same.  2. Hyperlipidemia - Continue diet/meds, exercise,& lifestyle modifications. Continue monitor periodic cholesterol/liver & renal functions   3. T2_NIDDM  - Continue diet, exercise, lifestyle modifications. Monitor appropriate labs.  4. Vitamin D Deficiency - Continue supplementation.  5. LaryngoTracheoBronchitis - Rx Z Pak & 1 RF   Recommended regular exercise, BP monitoring, weight control, and discussed med and SE's. Recommended labs to assess and monitor clinical status. Further disposition pending results of labs.

## 2014-09-08 LAB — VITAMIN D 25 HYDROXY (VIT D DEFICIENCY, FRACTURES): Vit D, 25-Hydroxy: 35 ng/mL (ref 30–100)

## 2014-09-08 LAB — HEPATIC FUNCTION PANEL
ALT: 68 U/L — AB (ref 0–35)
AST: 14 U/L (ref 0–37)
Albumin: 4.2 g/dL (ref 3.5–5.2)
Alkaline Phosphatase: 265 U/L — ABNORMAL HIGH (ref 39–117)
BILIRUBIN DIRECT: 0.1 mg/dL (ref 0.0–0.3)
BILIRUBIN INDIRECT: 0.3 mg/dL (ref 0.2–1.2)
Total Bilirubin: 0.4 mg/dL (ref 0.2–1.2)
Total Protein: 7.5 g/dL (ref 6.0–8.3)

## 2014-09-08 LAB — BASIC METABOLIC PANEL WITH GFR
BUN: 16 mg/dL (ref 6–23)
CALCIUM: 10.6 mg/dL — AB (ref 8.4–10.5)
CO2: 28 meq/L (ref 19–32)
Chloride: 93 mEq/L — ABNORMAL LOW (ref 96–112)
Creat: 0.71 mg/dL (ref 0.50–1.10)
GFR, Est African American: >89 mL/min
GFR, Est Non African American: >89 mL/min
GLUCOSE: 463 mg/dL — AB (ref 70–99)
POTASSIUM: 5 meq/L (ref 3.5–5.3)
SODIUM: 137 meq/L (ref 135–145)

## 2014-09-08 LAB — LIPID PANEL
CHOL/HDL RATIO: 3.6 ratio
Cholesterol: 182 mg/dL (ref 0–200)
HDL: 51 mg/dL (ref >39)
LDL Cholesterol: 79 mg/dL (ref 0–99)
Triglycerides: 259 mg/dL — ABNORMAL HIGH (ref <150)
VLDL: 52 mg/dL — AB (ref 0–40)

## 2014-09-08 LAB — HEMOGLOBIN A1C
HEMOGLOBIN A1C: 17.1 % — AB (ref <5.7)
Mean Plasma Glucose: 444 mg/dL — ABNORMAL HIGH (ref <117)

## 2014-09-08 LAB — TSH: TSH: 1.587 u[IU]/mL (ref 0.350–4.500)

## 2014-09-08 LAB — MAGNESIUM: Magnesium: 1.8 mg/dL (ref 1.5–2.5)

## 2014-09-13 ENCOUNTER — Other Ambulatory Visit: Payer: Self-pay | Admitting: Internal Medicine

## 2014-09-20 ENCOUNTER — Ambulatory Visit: Payer: BC Managed Care – PPO | Admitting: Internal Medicine

## 2014-09-20 DIAGNOSIS — Z9119 Patient's noncompliance with other medical treatment and regimen: Secondary | ICD-10-CM | POA: Insufficient documentation

## 2014-09-20 DIAGNOSIS — Z91199 Patient's noncompliance with other medical treatment and regimen due to unspecified reason: Secondary | ICD-10-CM | POA: Insufficient documentation

## 2014-09-20 NOTE — Progress Notes (Signed)
Patient ID: [name], female   DOB: 1955-12-04, 58 y.o.   MRN: [id number]   N O N - C O M P L I A N C E

## 2014-11-10 ENCOUNTER — Other Ambulatory Visit: Payer: Self-pay | Admitting: Physician Assistant

## 2015-01-18 ENCOUNTER — Encounter: Payer: Self-pay | Admitting: Internal Medicine

## 2015-01-18 ENCOUNTER — Ambulatory Visit (INDEPENDENT_AMBULATORY_CARE_PROVIDER_SITE_OTHER): Payer: BC Managed Care – PPO | Admitting: Internal Medicine

## 2015-01-18 VITALS — BP 108/68 | HR 80 | Temp 98.0°F | Resp 16 | Ht 61.0 in | Wt 128.0 lb

## 2015-01-18 DIAGNOSIS — J069 Acute upper respiratory infection, unspecified: Secondary | ICD-10-CM

## 2015-01-18 DIAGNOSIS — Z23 Encounter for immunization: Secondary | ICD-10-CM

## 2015-01-18 MED ORDER — PROMETHAZINE-CODEINE 6.25-10 MG/5ML PO SYRP: 5.0000 mL | ORAL_SOLUTION | Freq: Three times a day (TID) | ORAL | Status: DC | PRN

## 2015-01-18 MED ORDER — AZITHROMYCIN 250 MG PO TABS: ORAL_TABLET | ORAL | Status: DC

## 2015-01-18 MED ORDER — DEXAMETHASONE SODIUM PHOSPHATE 100 MG/10ML IJ SOLN
10.0000 mg | Freq: Once | INTRAMUSCULAR | Status: AC
  Administered 2015-01-18: 10 mg via INTRAMUSCULAR

## 2015-01-18 MED ORDER — PREDNISONE 20 MG PO TABS: ORAL_TABLET | ORAL | Status: DC

## 2015-01-18 NOTE — Patient Instructions (Signed)

## 2015-01-18 NOTE — Progress Notes (Signed)
Patient ID: [name], female   DOB: 12/13/1955, 59 y.o.   MRN: [id number]  HPI  Patient presents to the office for evaluation of cough.  It has been going on for 1 weeks.  Patient reports cough, dry, barky, paroxysmal,  night > day.  They also endorse change in voice, chills, fever, postnasal drip and sinus pressure, congestion, yellow nasal congestion, sneezing, itchy watery eyes.  .  They have tried antitussives, antihistamines or cough syrups.  They report that nothing has worked.  They admits to other sick contacts.  She does have seasonal allergies.    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     Review of Systems  Constitutional: Negative for fever, chills and malaise/fatigue.  HENT: Positive for congestion and ear pain. Negative for ear discharge, nosebleeds and sore throat.   Respiratory: Positive for cough and shortness of breath. Negative for sputum production and wheezing.   Cardiovascular: Negative for chest pain and leg swelling.  Neurological: Positive for headaches.    PE:  General:  Alert and non-toxic, WDWN, NAD HEENT: NCAT, PERLA, EOM normal, no occular discharge or erythema.  Nasal mucosal edema with sinus tenderness to palpation.  Oropharynx clear with minimal oropharyngeal edema and erythema.  Mucous membranes moist and pink. Neck:  Cervical adenopathy Chest:  RRR no MRGs.  Lungs clear to auscultation A&P with no wheezes rhonchi or rales.   Abdomen: +BS x 4 quadrants, soft, non-tender, no guarding, rigidity, or rebound. Skin: warm and dry no rash Neuro: A&Ox4, CN II-XII grossly intact  Assessment and Plan:   1. Acute URI  - azithromycin (ZITHROMAX Z-PAK) 250 MG tablet; 2 po day one, then 1 daily x 4 days  Dispense: 5 tablet; Refill: 1 - promethazine-codeine (PHENERGAN WITH CODEINE) 6.25-10 MG/5ML syrup; Take 5 mLs by mouth every 8 (eight) hours as needed for cough (severe cough).  Dispense: 120 mL; Refill: 0 -  predniSONE (DELTASONE) 20 MG tablet; 3 tabs po day one, then 2 tabs daily x 4 days  Dispense: 11 tablet; Refill: 0 - dexamethasone (DECADRON) injection 10 mg; Inject 1 mL (10 mg total) into the muscle once.  2. Need for vaccination to prevent tuberculosis  - PPD

## 2015-01-20 LAB — TB SKIN TEST
Induration: 0 mm
TB SKIN TEST: NEGATIVE

## 2015-04-26 ENCOUNTER — Other Ambulatory Visit: Payer: Self-pay | Admitting: Emergency Medicine

## 2015-04-26 ENCOUNTER — Other Ambulatory Visit: Payer: Self-pay | Admitting: Internal Medicine

## 2015-05-18 ENCOUNTER — Ambulatory Visit: Payer: Self-pay | Admitting: Internal Medicine

## 2015-05-19 ENCOUNTER — Ambulatory Visit (INDEPENDENT_AMBULATORY_CARE_PROVIDER_SITE_OTHER): Payer: BC Managed Care – PPO | Admitting: Internal Medicine

## 2015-05-19 ENCOUNTER — Encounter: Payer: Self-pay | Admitting: Internal Medicine

## 2015-05-19 ENCOUNTER — Other Ambulatory Visit: Payer: Self-pay | Admitting: Internal Medicine

## 2015-05-19 VITALS — BP 122/80 | HR 80 | Temp 97.7°F | Resp 16 | Ht 61.0 in | Wt 138.2 lb

## 2015-05-19 DIAGNOSIS — Z23 Encounter for immunization: Secondary | ICD-10-CM | POA: Diagnosis not present

## 2015-05-19 DIAGNOSIS — Z6826 Body mass index (BMI) 26.0-26.9, adult: Secondary | ICD-10-CM | POA: Diagnosis not present

## 2015-05-19 DIAGNOSIS — Z9119 Patient's noncompliance with other medical treatment and regimen: Secondary | ICD-10-CM | POA: Diagnosis not present

## 2015-05-19 DIAGNOSIS — E782 Mixed hyperlipidemia: Secondary | ICD-10-CM | POA: Diagnosis not present

## 2015-05-19 DIAGNOSIS — M150 Primary generalized (osteo)arthritis: Secondary | ICD-10-CM | POA: Diagnosis not present

## 2015-05-19 DIAGNOSIS — I10 Essential (primary) hypertension: Secondary | ICD-10-CM

## 2015-05-19 DIAGNOSIS — Z79899 Other long term (current) drug therapy: Secondary | ICD-10-CM | POA: Diagnosis not present

## 2015-05-19 DIAGNOSIS — E109 Type 1 diabetes mellitus without complications: Secondary | ICD-10-CM | POA: Diagnosis not present

## 2015-05-19 DIAGNOSIS — M159 Polyosteoarthritis, unspecified: Secondary | ICD-10-CM

## 2015-05-19 DIAGNOSIS — E559 Vitamin D deficiency, unspecified: Secondary | ICD-10-CM

## 2015-05-19 DIAGNOSIS — Z91199 Patient's noncompliance with other medical treatment and regimen due to unspecified reason: Secondary | ICD-10-CM

## 2015-05-19 LAB — BASIC METABOLIC PANEL WITH GFR
BUN: 13 mg/dL (ref 7–25)
CHLORIDE: 100 mmol/L (ref 98–110)
CO2: 31 mmol/L (ref 20–31)
Calcium: 9.5 mg/dL (ref 8.6–10.4)
Creat: 0.59 mg/dL (ref 0.50–0.99)
GFR, Est African American: >89 mL/min (ref >=60)
GLUCOSE: 102 mg/dL — AB (ref 65–99)
POTASSIUM: 4.4 mmol/L (ref 3.5–5.3)
Sodium: 142 mmol/L (ref 135–146)

## 2015-05-19 LAB — CBC WITH DIFFERENTIAL/PLATELET
Basophils Absolute: 0.1 10*3/uL (ref 0.0–0.1)
Basophils Relative: 1 % (ref 0–1)
EOS PCT: 1 % (ref 0–5)
Eosinophils Absolute: 0.1 10*3/uL (ref 0.0–0.7)
HEMATOCRIT: 42.6 % (ref 36.0–46.0)
Hemoglobin: 13.7 g/dL (ref 12.0–15.0)
LYMPHS ABS: 3.6 10*3/uL (ref 0.7–4.0)
LYMPHS PCT: 31 % (ref 12–46)
MCH: 27.3 pg (ref 26.0–34.0)
MCHC: 32.2 g/dL (ref 30.0–36.0)
MCV: 84.9 fL (ref 78.0–100.0)
MPV: 10.9 fL (ref 8.6–12.4)
Monocytes Absolute: 0.6 10*3/uL (ref 0.1–1.0)
Monocytes Relative: 5 % (ref 3–12)
NEUTROS ABS: 7.1 10*3/uL (ref 1.7–7.7)
NEUTROS PCT: 62 % (ref 43–77)
Platelets: 366 10*3/uL (ref 150–400)
RBC: 5.02 MIL/uL (ref 3.87–5.11)
RDW: 14.8 % (ref 11.5–15.5)
WBC: 11.5 10*3/uL — AB (ref 4.0–10.5)

## 2015-05-19 LAB — LIPID PANEL
Cholesterol: 160 mg/dL (ref 125–200)
HDL: 60 mg/dL (ref >=46)
LDL CALC: 76 mg/dL (ref <130)
TRIGLYCERIDES: 118 mg/dL (ref <150)
Total CHOL/HDL Ratio: 2.7 Ratio (ref <=5.0)
VLDL: 24 mg/dL (ref <30)

## 2015-05-19 LAB — HEPATIC FUNCTION PANEL
ALK PHOS: 103 U/L (ref 33–130)
ALT: 15 U/L (ref 6–29)
AST: 19 U/L (ref 10–35)
Albumin: 4.1 g/dL (ref 3.6–5.1)
BILIRUBIN TOTAL: 0.4 mg/dL (ref 0.2–1.2)
Bilirubin, Direct: 0.1 mg/dL (ref <=0.2)
Indirect Bilirubin: 0.3 mg/dL (ref 0.2–1.2)
TOTAL PROTEIN: 6.3 g/dL (ref 6.1–8.1)

## 2015-05-19 LAB — MAGNESIUM: Magnesium: 1.7 mg/dL (ref 1.5–2.5)

## 2015-05-19 LAB — HEMOGLOBIN A1C
Hgb A1c MFr Bld: 15.4 % — ABNORMAL HIGH (ref <5.7)
Mean Plasma Glucose: 395 mg/dL — ABNORMAL HIGH (ref <117)

## 2015-05-19 LAB — TSH: TSH: 2.131 u[IU]/mL (ref 0.350–4.500)

## 2015-05-19 NOTE — Patient Instructions (Signed)

## 2015-05-20 LAB — VITAMIN D 25 HYDROXY (VIT D DEFICIENCY, FRACTURES): Vit D, 25-Hydroxy: 35 ng/mL (ref 30–100)

## 2015-05-21 ENCOUNTER — Encounter: Payer: Self-pay | Admitting: Internal Medicine

## 2015-05-21 NOTE — Progress Notes (Signed)
Patient ID: [name], female   DOB: Jan 24, 1956, 59 y.o.   MRN: [id number]   This very nice, but medically irresponsible & unreliable 59 y.o. MWF presents conditionally for  follow up with Hypertension, Hyperlipidemia, poorly controlled Insulin Dependent T2_DM and Vitamin D Deficiency. Patient is also c/o bilat hip & knee pains and requests to see Dr Lorin Mercy who has seen her husband.   Patient is monitored expectantly with  hx/o labile HTN since 2006. Today's BP: 122/80 mmHg. Patient has had no complaints of any cardiac type chest pain, palpitations, dyspnea/orthopnea/PND, dizziness, claudication, or dependent edema.   Hyperlipidemia is controlled with diet. Today's Lipids are at goal - Cholesterol 160; HDL 60; LDL Cholesterol 76; & Triglycerides 118.   Also, the patient has history of Insulin-Dependent T2_DM and historically has has had no symptoms of reactive hypoglycemia, diabetic polys, paresthesias or visual blurring.  Today's A1c is 15.4% - consistent with her hx/o being off of her insulin And Metformin.   Further, the patient also has history of Vitamin D Deficiency and does not supplement Vit D as recommended. Today's vitamin D is very low at 35.     Medication Sig  . aspirin EC 81 MG tablet Take 81 mg by mouth at bedtime.   . SUPER B COMPLEX  Take 1 tablet by mouth daily.  Marland Kitchen VITAMIN D 1000 UNITS  Take 1,000 Units by mouth daily.  . insulin NPH (HUMULIN N,NOVOLIN N) 100 UNIT/ML injection Inject into the skin. 30 UNITS A.M. / 10 UNITS P.M. - taking sporadically  . MULTIVITAMIN WITH MINERALS Take 1 tablet by mouth daily.  Marland Kitchen omeprazole  20 MG capsule Take 20 mg by mouth daily.  . metFORMIN -XR 500 MG 24 hr tablet TAKE ONE TAB THREE TIMES DAILY  (Patient not taking)   Allergies  Allergen Reactions  . Pepcid [Famotidine] Other (See Comments)    HEADACHE  . Prevacid [Lansoprazole] Diarrhea   PMHx:   Past Medical History  Diagnosis Date  . Diabetes mellitus   . Gout   . Acid reflux    . Hypertension   . Cancer 1977    CERVICAL  . GERD (gastroesophageal reflux disease) 11/14/2013  . Unspecified essential hypertension 11/14/2013  . Mixed hyperlipidemia 11/14/2013   Immunization History  Administered Date(s) Administered  . Influenza Split 06/20/2014  . Influenza Whole 06/17/2013  . PPD Test 01/18/2015  . Pneumococcal Conjugate-13 05/19/2015  . Pneumococcal-Unspecified 09/23/2009  . Tdap 09/23/2006   Past Surgical History  Procedure Laterality Date  . Pancreatectomy    . Splenectomy    . Shoulder closed reduction Left 02/03/2013    Procedure: CLOSED MANIPULATION SHOULDER;  Surgeon: Vickey Huger, MD;  Location: [facility];  Service: Orthopedics;  Laterality: Left;  CLOSED MANIPULATION LEFT SHOULDER  . Tonsillectomy  1970  . Abdominal hysterectomy  1977    CERVICAL CA  . Cholecystectomy    . Eye surgery      6/3 LASER FOR GLAUCOMA, 9/03 CE/IOL IMPLANTS   FHx:    Reviewed / unchanged  SHx:    Reviewed / unchanged  Systems Review:  Constitutional: Denies fever, chills, wt changes, headaches, insomnia, fatigue, night sweats, change in appetite. Eyes: Denies redness, blurred vision, diplopia, discharge, itchy, watery eyes.  ENT: Denies discharge, congestion, post nasal drip, epistaxis, sore throat, earache, hearing loss, dental pain, tinnitus, vertigo, sinus pain, snoring.  CV: Denies chest pain, palpitations, irregular heartbeat, syncope, dyspnea, diaphoresis, orthopnea, PND, claudication or edema. Respiratory: denies cough, dyspnea, DOE,  pleurisy, hoarseness, laryngitis, wheezing.  Gastrointestinal: Denies dysphagia, odynophagia, heartburn, reflux, water brash, abdominal pain or cramps, nausea, vomiting, bloating, diarrhea, constipation, hematemesis, melena, hematochezia  or hemorrhoids. Genitourinary: Denies dysuria, frequency, urgency, nocturia, hesitancy, discharge, hematuria or flank pain. Musculoskeletal: Denies arthralgias, myalgias, stiffness, jt. swelling, pain,  limping or strain/sprain.  Skin: Denies pruritus, rash, hives, warts, acne, eczema or change in skin lesion(s). Neuro: No weakness, tremor, incoordination, spasms, paresthesia or pain. Psychiatric: Denies confusion, memory loss or sensory loss. Endo: Denies change in weight, skin or hair change.  Heme/Lymph: No excessive bleeding, bruising or enlarged lymph nodes.  Physical Exam  BP 122/80 mmHg  Pulse 80  Temp(Src) 97.7 F (36.5 C)  Resp 16  Ht 5\' 1"  (1.549 m)  Wt 138 lb 3.2 oz (62.687 kg)  BMI 26.13 kg/m2  Appears well nourished and in no distress. Eyes: PERRLA, EOMs, conjunctiva no swelling or erythema. Sinuses: No frontal/maxillary tenderness ENT/Mouth: EAC's clear, TM's nl w/o erythema, bulging. Nares clear w/o erythema, swelling, exudates. Oropharynx clear without erythema or exudates. Oral hygiene is good. Tongue normal, non obstructing. Hearing intact.  Neck: Supple. Thyroid nl. Car 2+/2+ without bruits, nodes or JVD. Chest: Respirations nl with BS clear & equal w/o rales, rhonchi, wheezing or stridor.  Cor: Heart sounds normal w/ regular rate and rhythm without sig. murmurs, gallops, clicks, or rubs. Peripheral pulses normal and equal  without edema.  Abdomen: Soft & bowel sounds normal. Non-tender w/o guarding, rebound, hernias, masses, or organomegaly.  Lymphatics: Unremarkable.  Musculoskeletal: Full ROM all peripheral extremities, joint stability, 5/5 strength, and normal gait.  Skin: Warm, dry without exposed rashes, lesions or ecchymosis apparent.  Neuro: Cranial nerves intact, reflexes equal bilaterally. Sensory-motor testing grossly intact. Tendon reflexes grossly intact.  Pysch: Alert & oriented x 3.  Insight and judgement nl & appropriate. No ideations.  Assessment and Plan:  1. Essential hypertension  - TSH  2. Mixed hyperlipidemia  - Lipid panel  3. Insulin Dependent T2_DM - poorly controlled   - Hemoglobin A1c  4. Vitamin D deficiency  - Vit D  25  hydroxy   5. Need for prophylactic vaccination against Streptococcus pneumoniae (pneumococcus)  - Pneumococcal conjugate vaccine 13-valent  6. Non-compliance with treatment   7. Medication management  - CBC with Differential/Platelet - BASIC METABOLIC PANEL WITH GFR - Hepatic function panel - Magnesium  8. BMI 26.0-26.9,adult   Recommended regular exercise, BP monitoring, weight control, and discussed med and SE's. Recommended labs to assess and monitor clinical status. Further disposition pending results of labs. Over 30 minutes of exam, counseling, chart review was performed. Patient was strongly advised the importance of compliance with both her diet and medications and the negative consequence thereof. She has been advised and reminded as once before that if she doesn't make significant improvement in self control of her diet & diabetes that I will not continue as her physican as I advised her I cannot and will not be a party to her poor compliance.

## 2015-05-24 ENCOUNTER — Ambulatory Visit (INDEPENDENT_AMBULATORY_CARE_PROVIDER_SITE_OTHER): Payer: BC Managed Care – PPO | Admitting: Internal Medicine

## 2015-05-24 ENCOUNTER — Encounter: Payer: Self-pay | Admitting: Internal Medicine

## 2015-05-24 VITALS — BP 106/64 | HR 80 | Temp 97.8°F | Resp 18 | Ht 61.0 in | Wt 137.0 lb

## 2015-05-24 DIAGNOSIS — E1165 Type 2 diabetes mellitus with hyperglycemia: Secondary | ICD-10-CM | POA: Diagnosis not present

## 2015-05-24 NOTE — Progress Notes (Signed)
Patient ID: [name], female   DOB: March 31, 1956, 59 y.o.   MRN: [id number]  HPI  Patient presents to the office for evaluation of sinus pressure and congestion.  It has been going on for 3 weeks.  Patient reports she has had a cough that is night > day, dry, worse with lying down.  They also endorse postnasal drip and runny nose, congestion, itchy watery eyes, sneezing..  They have tried zpak with mild relief..  They report that nothing has worked.  They denies other sick contacts.  She reports that she usually has some problems with changes of the seasons.    She reports that she is having a hard time with her diabetes.  She reports that she is doesn't know what she should eat.  She feels like she doesn't have a whole lot of information about what she should be eating.  She feels like she is trying to eat.  She reports that she cannot take 30 units in the morning because she feels like it drops her sugars way too low.  She reports that she did have to file for bankrupty and now she is having to cut back on spending   Review of Systems  Constitutional: Negative for fever, chills and malaise/fatigue.  HENT: Positive for congestion and sore throat. Negative for ear pain.   Eyes: Positive for discharge (watery discharge).  Respiratory: Positive for cough. Negative for shortness of breath and wheezing.   Cardiovascular: Negative for chest pain, palpitations and leg swelling.  Skin: Negative.   Neurological: Positive for headaches.    PE: Filed Vitals:   05/24/15 0945  BP: 106/64  Pulse: 80  Temp: 97.8 F (36.6 C)  Resp: 18     General:  Alert and non-toxic, WDWN, NAD HEENT: NCAT, PERLA, EOM normal, no occular discharge or erythema.  Nasal mucosal edema with sinus tenderness to palpation.  Oropharynx clear with minimal oropharyngeal edema and erythema.  Mucous membranes moist and pink. Neck:  Cervical adenopathy Chest:  RRR no MRGs.  Lungs clear to auscultation A&P with no wheezes  rhonchi or rales.   Abdomen: +BS x 4 quadrants, soft, non-tender, no guarding, rigidity, or rebound. Skin: warm and dry no rash Neuro: A&Ox4, CN II-XII grossly intact  Assessment and Plan:   1. Type 2 diabetes mellitus with hyperglycemia -cut back to 20-25 units in the morning and 10 units in the evening -discussed diet and exercise - Amb ref to Medical Nutrition Therapy-MNT   2.  Allergic Rhinitis -claritin -OTC nasal steroid spray  Over 40 minutes of exam, counseling, chart review and high complex critical decision making was performed

## 2015-05-24 NOTE — Patient Instructions (Addendum)
Please start taking claritin or loratidine daily to help with allergies.  You can also use flonase, rhinocort, nasacort in your nose 2 sprays per nostril before bedtime.  This will help to control your allergies.    I am going to put in a referral to the dietician to see if we can get you to have some more education for your diet.   Please continue to take your insulin morning and night.  Take the 10 units at night time with food.  Please take 20-25 units in the morning with food.  Your blood sugar is low if it is less than 70.

## 2015-05-26 ENCOUNTER — Other Ambulatory Visit: Payer: Self-pay | Admitting: Emergency Medicine

## 2015-06-09 ENCOUNTER — Ambulatory Visit (INDEPENDENT_AMBULATORY_CARE_PROVIDER_SITE_OTHER): Payer: BC Managed Care – PPO | Admitting: Internal Medicine

## 2015-06-09 ENCOUNTER — Encounter: Payer: Self-pay | Admitting: Internal Medicine

## 2015-06-09 ENCOUNTER — Other Ambulatory Visit: Payer: Self-pay | Admitting: *Deleted

## 2015-06-09 VITALS — BP 130/74 | HR 80 | Temp 97.3°F | Resp 18 | Ht 61.0 in | Wt 140.0 lb

## 2015-06-09 DIAGNOSIS — E119 Type 2 diabetes mellitus without complications: Secondary | ICD-10-CM | POA: Diagnosis not present

## 2015-06-09 DIAGNOSIS — Z794 Long term (current) use of insulin: Secondary | ICD-10-CM

## 2015-06-09 DIAGNOSIS — E1165 Type 2 diabetes mellitus with hyperglycemia: Secondary | ICD-10-CM

## 2015-06-09 DIAGNOSIS — Z91199 Patient's noncompliance with other medical treatment and regimen due to unspecified reason: Secondary | ICD-10-CM

## 2015-06-09 DIAGNOSIS — Z9119 Patient's noncompliance with other medical treatment and regimen: Secondary | ICD-10-CM | POA: Diagnosis not present

## 2015-06-09 MED ORDER — METFORMIN HCL ER 500 MG PO TB24: ORAL_TABLET | ORAL | Status: DC

## 2015-06-11 ENCOUNTER — Encounter: Payer: Self-pay | Admitting: Internal Medicine

## 2015-06-11 DIAGNOSIS — M109 Gout, unspecified: Secondary | ICD-10-CM | POA: Insufficient documentation

## 2015-06-11 DIAGNOSIS — E1165 Type 2 diabetes mellitus with hyperglycemia: Secondary | ICD-10-CM | POA: Insufficient documentation

## 2015-06-11 NOTE — Progress Notes (Signed)
Subjective:    Patient ID: [name], female    DOB: Jun 24, 1956, 59 y.o.   MRN: [id number]  HPI  Patient as been lost to f/u for mgmt of her Insulin dependent T2_DM with control compromised by poor dietary compliance and lack of purchasing her insulin, alto she has been advised to use the [employer] Novolin N at $25/bottle. Patient is back on insulin and  returns for CBG review of bid monitoring with tendency for some nocturnal hypoglycemia. Still not at full dose of Metformin compromised by diarrhea.   Medication Sig  . aspirin EC 81 MG tablet Take 81 mg by mouth at bedtime.   . SUPER B COMPLEX Take 1 tablet by mouth daily.  . Fluconazole 100 MG tablet TAKE ONE TABLET BY MOUTH ONCE DAILY OR AS DIRECTED  . NOVOLIN N 100 UNIT/ML injection Inject 20 UNITS A.M.  & 10 UNITS P.M.  . Magnesium 500 MG TABS Take 500 mg by mouth daily.  Marland Kitchen omeprazole  20 MG capsule Take 20 mg by mouth daily.  Marland Kitchen VITAMIN D 1000 UNITS tablet Take 1,000 Units by mouth daily.  . metFORMIN -XR 500 MG  TAKE ONE TAB THREE TIMES DAILY   Allergies  Allergen Reactions  . Pepcid [Famotidine] Other (See Comments)    HEADACHE  . Prevacid [Lansoprazole] Diarrhea   Past Medical History  Diagnosis Date  . Diabetes mellitus   . Gout   . Acid reflux   . Hypertension   . Cancer 1977    CERVICAL  . GERD (gastroesophageal reflux disease) 11/14/2013  . Unspecified essential hypertension 11/14/2013  . Mixed hyperlipidemia 11/14/2013   Past Surgical History  Procedure Laterality Date  . Pancreatectomy    . Splenectomy    . Shoulder closed reduction Left 02/03/2013    Procedure: CLOSED MANIPULATION SHOULDER;  Surgeon: Vickey Huger, MD;  Location: [facility];  Service: Orthopedics;  Laterality: Left;  CLOSED MANIPULATION LEFT SHOULDER  . Tonsillectomy  1970  . Abdominal hysterectomy  1977    CERVICAL CA  . Cholecystectomy    . Eye surgery      6/3 LASER FOR GLAUCOMA, 9/03 CE/IOL IMPLANTS   Review of Systems 10 point systems  review negative except as above.    Objective:   Physical Exam  BP 130/74 mmHg  Pulse 80  Temp(Src) 97.3 F (36.3 C)  Resp 18  Ht 5\' 1"  (1.549 m)  Wt 140 lb (63.504 kg)  BMI 26.47 kg/m2  HEENT - Eac's patent. TM's Nl. EOM's full. PERRLA. NasoOroPharynx clear. Neck - supple. Nl Thyroid. Carotids 2+ & No bruits, nodes, JVD Chest - Clear equal BS w/o Rales, rhonchi, wheezes. Cor - Nl HS. RRR w/o sig MGR. PP 1(+). No edema. Abd - No palpable organomegaly, masses or tenderness. BS nl. MS- FROM w/o deformities. Muscle power, tone and bulk Nl. Gait Nl. Neuro - No obvious Cr N abnormalities. Sensory, motor and Cerebellar functions appear Nl w/o focal abnormalities. Psyche - Mental status normal.  No delusions, ideations or obvious mood abnormalities. Patient demonstrates very limited insight into her poor compliance and consequent long term outlook.     Assessment & Plan:   1. Poorly controlled T2_DM due to poor compliance  - Patient was again counseled on the extreme importance of dietary & medicine compliance   2. Insulin Requiring T2_DM  Patient was re-educated in importance of dietary compliance along with structure bid monitoring of CBG's and dosing & instructed to call anytime she is uncertain  of hoe to adjust meds . She was also advised to try to increase her Metformin up to 4 tabs/day and to use imodium as needed.   3. Non-compliance with treatment  - Recc 1  Month f/u with her list of bid CBG's and Insulin dosing.

## 2015-06-24 ENCOUNTER — Other Ambulatory Visit: Payer: Self-pay | Admitting: Emergency Medicine

## 2015-06-24 DIAGNOSIS — E119 Type 2 diabetes mellitus without complications: Secondary | ICD-10-CM

## 2015-06-24 DIAGNOSIS — Z794 Long term (current) use of insulin: Principal | ICD-10-CM

## 2015-07-01 ENCOUNTER — Encounter: Payer: Self-pay | Admitting: *Deleted

## 2015-07-11 ENCOUNTER — Encounter: Payer: Self-pay | Admitting: Internal Medicine

## 2015-07-11 ENCOUNTER — Ambulatory Visit (INDEPENDENT_AMBULATORY_CARE_PROVIDER_SITE_OTHER): Payer: BC Managed Care – PPO | Admitting: Internal Medicine

## 2015-07-11 VITALS — BP 124/80 | HR 76 | Temp 97.3°F | Resp 16 | Ht 61.0 in | Wt 140.8 lb

## 2015-07-11 DIAGNOSIS — Z794 Long term (current) use of insulin: Secondary | ICD-10-CM | POA: Diagnosis not present

## 2015-07-11 DIAGNOSIS — E1165 Type 2 diabetes mellitus with hyperglycemia: Secondary | ICD-10-CM | POA: Diagnosis not present

## 2015-07-11 DIAGNOSIS — Z9119 Patient's noncompliance with other medical treatment and regimen: Secondary | ICD-10-CM | POA: Diagnosis not present

## 2015-07-11 DIAGNOSIS — Z23 Encounter for immunization: Secondary | ICD-10-CM | POA: Diagnosis not present

## 2015-07-11 DIAGNOSIS — M797 Fibromyalgia: Secondary | ICD-10-CM

## 2015-07-11 DIAGNOSIS — E119 Type 2 diabetes mellitus without complications: Secondary | ICD-10-CM | POA: Diagnosis not present

## 2015-07-11 DIAGNOSIS — Z91199 Patient's noncompliance with other medical treatment and regimen due to unspecified reason: Secondary | ICD-10-CM

## 2015-07-11 DIAGNOSIS — Z6826 Body mass index (BMI) 26.0-26.9, adult: Secondary | ICD-10-CM | POA: Diagnosis not present

## 2015-07-11 MED ORDER — GABAPENTIN 100 MG PO CAPS: ORAL_CAPSULE | ORAL | Status: DC

## 2015-07-11 NOTE — Progress Notes (Signed)
Subjective:    Patient ID: [name], female    DOB: 1956-06-25, 59 y.o.   MRN: [id number]  HPI  Patient returns today for close scrunity for med compliance and resumption of her Novolin and attempting to increase Metformin as compromised by diarrhea.   currently on 2 Metformin and only taking occas Imodium, not as recc up to 1-2 tab with each Metformin. Review of Med list - if reluable suggests compliance w/bid dosing of Novolin-N (20u- am & 10 u-pm) and bid CBG monitoring. List shows FBG's 100-140 in am and 100-120 in pm. Patient's presentation of  Insulin Dep DM followed a hospitalization with ETOH related pancreatitis in 2006 & her compliance has generally been sporadically.     Othrer issues today are c/o diffuse myalgias of extremities and am stiffness.  Medication Sig  . aspirin EC 81 MG tablet Take 81 mg by mouth at bedtime.   . B Complex-C  Take 1 tablet by mouth daily.  Marland Kitchen VITAMIN D  Take 5,000 Units by mouth 2 (two) times daily.  . fluconazole  100 MG tablet TAKE ONE TABLET BY MOUTH ONCE DAILY OR AS DIRECTED  . NOVOLIN N 100 UNIT/ML injection Inject into the skin. 20 UNITS A.M.  & 10 UNITS P.M.  . Magnesium 500 MG TABS Take 500 mg by mouth daily.  . metFORMIN -XR 500 MG 24 hr tablet TAKE ONE TAB THREE TIMES DAILY AS NEEDED FOR DIABETES  . omeprazole (PRILOSEC) 20 MG capsule Take 20 mg by mouth daily.  .  Zyrtec 10 mg  1 daily.  Marland Kitchen NASACORT AQ  Place 2 sprays into the nose 2 (two) times daily. OTC    Allergies  Allergen Reactions  . Pepcid [Famotidine] Other (See Comments)    HEADACHE  . Prevacid [Lansoprazole] Diarrhea   Past Medical History  Diagnosis Date  . Gout   . Cancer ([place]) 1977    CERVICAL  . GERD (gastroesophageal reflux disease) 11/14/2013  . Mixed hyperlipidemia 11/14/2013   Past Surgical History  Procedure Laterality Date  . Pancreatectomy    . Splenectomy    . Shoulder closed reduction Left 02/03/2013    Procedure: CLOSED MANIPULATION SHOULDER;   Surgeon: Vickey Huger, MD;  Location: [facility];  Service: Orthopedics;  Laterality: Left;  CLOSED MANIPULATION LEFT SHOULDER  . Tonsillectomy  1970  . Abdominal hysterectomy  1977    CERVICAL CA  . Cholecystectomy    . Eye surgery      6/3 LASER FOR GLAUCOMA, 9/03 CE/IOL IMPLANTS   Review of Systems 10 point systems review negative except as above.     Objective:   Physical Exam  BP 124/80 mmHg  Pulse 76  Temp(Src) 97.3 F (36.3 C)  Resp 16  Ht 5\' 1"  (1.549 m)  Wt 140 lb 12.8 oz (63.866 kg)  BMI 26.62 kg/m2  HEENT - Eac's patent. TM's Nl. EOM's full. PERRLA. NasoOroPharynx clear. Neck - supple. Nl Thyroid. Carotids 2+ & No bruits, nodes, JVD Chest - Clear equal BS w/o Rales, rhonchi, wheezes. Cor - Nl HS. RRR w/o sig MGR. PP 1(+). No edema. Abd - No palpable organomegaly, masses or tenderness. BS nl. MS- FROM w/o deformities. Muscle power, tone and bulk Nl. Gait Nl. Neuro - No obvious Cr N abnormalities. Sensory, motor and Cerebellar functions appear Nl w/o focal abnormalities. Psyche - Mental status normal & appropriate.  No delusions, ideations or obvious mood abnormalities.    Assessment & Plan:   1. Fibromyalgia  -  could represent low Mag (since on Metformin & Omeprazole) and last Mag was low @ 1.7, but reluctant to start Mag supplements with her c/o diarrhea!  - gabapentin (NEURONTIN) 100 MG capsule; Take 1 capsule 3 to 4 x day for pain  Dispense: 120 capsule; Refill: 1  2. Insulin Requiring T2_DM  - Advised to increase Metformin up to 3 then 4 tabs/da - preceding each dose with 1-2 Imodium and allowing up to 12 tabs/24 hrs to control diarrhea.  - Also advised to switch insulin to Novolin 70/30 mix at same dose and if develop hypoglycemia to taper insulin dosing 2-5 units and call if questions on dosing adjustments.   3. Non-compliance with treatment   4. BMI 26.0-26.9,adult   5. Need for prophylactic vaccination and inoculation against influenza  - Flu vaccine  > 3yo with preservative IM (Fluvirin Influenza Split)  6. Inadequately controlled diabetes mellitus ([place])

## 2015-07-11 NOTE — Patient Instructions (Signed)
Switch Novolin - N to   Novolin 75/25 Mix  Stay at 20 units - Bkfst &  10 units - supper  +++++++++++++++++++++++++++++  Try to increase Metformin 50 mg XR to 4 /day  Take 1 with Bkfst & Lunch   & 2 with Supper  +++++++++++++++++++++  Take Immodium 2 tablets 3 x day   1/2 hour before meals

## 2015-07-26 ENCOUNTER — Encounter: Payer: Self-pay | Admitting: Internal Medicine

## 2015-08-16 ENCOUNTER — Ambulatory Visit: Payer: Self-pay | Admitting: Internal Medicine

## 2015-08-25 ENCOUNTER — Ambulatory Visit: Payer: Self-pay | Admitting: Internal Medicine

## 2015-10-05 ENCOUNTER — Encounter: Payer: Self-pay | Admitting: Internal Medicine

## 2015-10-18 ENCOUNTER — Ambulatory Visit: Payer: Self-pay | Admitting: Internal Medicine

## 2015-10-19 ENCOUNTER — Encounter: Payer: Self-pay | Admitting: Physician Assistant

## 2015-10-19 ENCOUNTER — Ambulatory Visit (INDEPENDENT_AMBULATORY_CARE_PROVIDER_SITE_OTHER): Payer: BC Managed Care – PPO | Admitting: Physician Assistant

## 2015-10-19 VITALS — BP 110/80 | HR 85 | Temp 97.5°F | Resp 16 | Ht 61.0 in | Wt 137.0 lb

## 2015-10-19 DIAGNOSIS — Z794 Long term (current) use of insulin: Secondary | ICD-10-CM | POA: Diagnosis not present

## 2015-10-19 DIAGNOSIS — E119 Type 2 diabetes mellitus without complications: Secondary | ICD-10-CM

## 2015-10-19 DIAGNOSIS — J069 Acute upper respiratory infection, unspecified: Secondary | ICD-10-CM

## 2015-10-19 MED ORDER — BENZONATATE 100 MG PO CAPS: 200.0000 mg | ORAL_CAPSULE | Freq: Three times a day (TID) | ORAL | Status: DC | PRN

## 2015-10-19 MED ORDER — PROMETHAZINE-CODEINE 6.25-10 MG/5ML PO SYRP: 5.0000 mL | ORAL_SOLUTION | Freq: Four times a day (QID) | ORAL | Status: DC | PRN

## 2015-10-19 MED ORDER — AZITHROMYCIN 250 MG PO TABS: ORAL_TABLET | ORAL | Status: AC

## 2015-10-19 MED ORDER — ALBUTEROL SULFATE HFA 108 (90 BASE) MCG/ACT IN AERS: 2.0000 | INHALATION_SPRAY | Freq: Four times a day (QID) | RESPIRATORY_TRACT | Status: DC | PRN

## 2015-10-19 NOTE — Progress Notes (Signed)
Subjective:    Patient ID: [name], female    DOB: 07/01/1956, 60 y.o.   MRN: [id number]  HPI 60 y.o. WF with uncontrolled DM presents with cough x 1-2 weeks. Has had productive cough, yellow sputum, sinus pressure and congestion, fever and chills, some chest pain with cough only, no SOB, wheezing. Has been taking mucinex, delsyum cough without help.  Getting worse.    Blood pressure 110/80, pulse 85, temperature 97.5 F (36.4 C), temperature source Temporal, resp. rate 16, height 5\' 1"  (1.549 m), weight 137 lb (62.143 kg), SpO2 99 %.  Current Outpatient Prescriptions on File Prior to Visit  Medication Sig Dispense Refill  . aspirin EC 81 MG tablet Take 81 mg by mouth at bedtime.     . B Complex-C (SUPER B COMPLEX PO) Take 1 tablet by mouth daily.    . Cholecalciferol (VITAMIN D PO) Take 5,000 Units by mouth 2 (two) times daily.    . fluconazole (DIFLUCAN) 100 MG tablet TAKE ONE TABLET BY MOUTH ONCE DAILY OR AS DIRECTED 30 tablet 0  . FREESTYLE LITE test strip USE ONE STRIP TO CHECK GLUCOSE THREE TIMES DAILY 100 each 5  . insulin NPH (HUMULIN N,NOVOLIN N) 100 UNIT/ML injection Inject into the skin. 20 UNITS A.M.  10 UNITS P.M.    . Lancets (FREESTYLE) lancets CHECK GLUCOSE THREE TIMES DAILY AS DIRECTED 300 each 3  . Magnesium 500 MG TABS Take 500 mg by mouth daily.    . metFORMIN (GLUCOPHAGE-XR) 500 MG 24 hr tablet TAKE ONE TABLET BY MOUTH THREE TIMES DAILY AS NEEDED FOR DIABETES 90 tablet 3  . omeprazole (PRILOSEC) 20 MG capsule Take 20 mg by mouth daily.    Marland Kitchen OVER THE COUNTER MEDICATION OTC Zyrtec 1 daily.    . Triamcinolone Acetonide (NASACORT AQ NA) Place 2 sprays into the nose 2 (two) times daily. OTC    . gabapentin (NEURONTIN) 100 MG capsule Take 1 capsule 3 to 4 x day for pain 120 capsule 1   No current facility-administered medications on file prior to visit.    Past Medical History  Diagnosis Date  . Gout   . Cancer ([place]) 1977    CERVICAL  . GERD  (gastroesophageal reflux disease) 11/14/2013  . Mixed hyperlipidemia 11/14/2013   Review of Systems  Constitutional: Positive for fever, chills and fatigue. Negative for diaphoresis.  HENT: Positive for congestion, postnasal drip, sinus pressure and sneezing. Negative for ear pain and sore throat.   Respiratory: Positive for cough and chest tightness. Negative for shortness of breath and wheezing.   Cardiovascular: Negative.   Gastrointestinal: Negative.   Genitourinary: Negative.   Musculoskeletal: Negative for neck pain.  Neurological: Positive for headaches.       Objective:   Physical Exam  Constitutional: She is oriented to person, place, and time. She appears well-developed and well-nourished.  HENT:  Right Ear: Hearing and external ear normal. No mastoid tenderness. Tympanic membrane is injected. Tympanic membrane is not perforated, not erythematous, not retracted and not bulging. A middle ear effusion is present.  Left Ear: Hearing and external ear normal. No mastoid tenderness. Tympanic membrane is injected. Tympanic membrane is not perforated, not erythematous, not retracted and not bulging. A middle ear effusion is present.  Nose: Right sinus exhibits maxillary sinus tenderness. Left sinus exhibits maxillary sinus tenderness.  Mouth/Throat: Uvula is midline, oropharynx is clear and moist and mucous membranes are normal.  Eyes: Conjunctivae and EOM are normal. Pupils are equal, round,  and reactive to light.  Neck: Neck supple.  Cardiovascular: Normal rate and regular rhythm.   Pulmonary/Chest: Effort normal. No respiratory distress. She has wheezes (diffuse).  Abdominal: Soft. Bowel sounds are normal.  Musculoskeletal: Normal range of motion.  Lymphadenopathy:    She has no cervical adenopathy.  Neurological: She is alert and oriented to person, place, and time.  Skin: Skin is warm and dry.       Assessment & Plan:  URI with uncontrolled sugars zpak with refill, codeine  cough syrup, albuterol inhaler, continue OTC meds Will not give prednisone Following up with DM nutritionist  Declines labs today, will follow up 1 month

## 2015-10-19 NOTE — Patient Instructions (Signed)
HOW TO TREAT VIRAL COUGH AND COLD SYMPTOMS:  -Symptoms usually last at least 1 week with the worst symptoms being around day 4.  - colds usually start with a sore throat and end with a cough, and the cough can take 2 weeks to get better.  -No antibiotics are needed for colds, flu, sore throats, cough, bronchitis UNLESS symptoms are longer than 7 days OR if you are getting better then get drastically worse.  -There are a lot of combination medications (Dayquil, Nyquil, Vicks 44, tyelnol cold and sinus, ETC). Please look at the ingredients on the back so that you are treating the correct symptoms and not doubling up on medications/ingredients.    Medicines you can use  Nasal congestion  - pseudoephedrine (Sudafed)- behind the counter, do not use if you have high blood pressure, medicine that have -D in them.  - phenylephrine (Sudafed PE) -Dextormethorphan + chlorpheniramine (Coridcidin HBP)- okay if you have high blood pressure -Oxymetazoline (Afrin) nasal spray- LIMIT to 3 days -Saline nasal spray -Neti pot (used distilled or bottled water)  Ear pain/congestion  -pseudoephedrine (sudafed) - Nasonex/flonase nasal spray  Fever  -Acetaminophen (Tyelnol) -Ibuprofen (Advil, motrin, aleve)  Sore Throat  -Acetaminophen (Tyelnol) -Ibuprofen (Advil, motrin, aleve) -Drink a lot of water -Gargle with salt water - Rest your voice (don't talk) -Throat sprays -Cough drops  Body Aches  -Acetaminophen (Tyelnol) -Ibuprofen (Advil, motrin, aleve)  Headache  -Acetaminophen (Tyelnol) -Ibuprofen (Advil, motrin, aleve) - Exedrin, Exedrin Migraine  Allergy symptoms (cough, sneeze, runny nose, itchy eyes) -Claritin or loratadine cheapest but likely the weakest  -Zyrtec or certizine at night because it can make you sleepy -The strongest is allegra or fexafinadine  Cheapest at [employer], [employer], [employer]  Cough  -Dextromethorphan (Delsym)- medicine that has DM in it -Guafenesin  (Mucinex/Robitussin) - cough drops - drink lots of water  Chest Congestion  -Guafenesin (Mucinex/Robitussin)  Red Itchy Eyes  - Naphcon-A  Upset Stomach  - Bland diet (nothing spicy, greasy, fried, and high acid foods like tomatoes, oranges, berries) -OKAY- cereal, bread, soup, crackers, rice -Eat smaller more frequent meals -reduce caffeine, no alcohol -Loperamide (Imodium-AD) if diarrhea -Prevacid for heart burn  General health when sick  -Hydration -wash your hands frequently -keep surfaces clean -change pillow cases and sheets often -Get fresh air but do not exercise strenuously -Vitamin D, double up on it - Vitamin C -Zinc    Sinusitis can be uncomfortable. People with sinusitis have congestion with yellow/green/gray discharge, sinus pain/pressure, pain around the eyes. Sinus infections almost ALWAYS stem from a viral infection and antibiotics don't work against a virus. Even when bacteria is responsible, the infections usually clear up on their own in a week or so.   PLEASE TRY TO DO OVER THE COUNTER TREATMENT AND PREDNISONE FOR 5-7 DAYS AND IF YOU ARE NOT GETTING BETTER OR GETTING WORSE THEN YOU CAN START ON AN ANTIBIOTIC GIVEN.  Can take the prednisone AT NIGHT WITH DINNER, it take 8-12 hours to start working so it will NOT affect your sleeping if you take it at night with your food!! Take two pills the first night and 1 or two pill the second night and then 1 pill the other nights.   Risk of antibiotic use: About 1 in 4 people who take antibiotics have side effects including stomach problems, dizziness, or rashes. Those problems clear up soon after stopping the drugs, but in rare cases antibiotics can cause severe allergic reaction. Over use of antibiotics also encourages the  growth of bacteria that can't be controlled easily with drugs. That makes you more vunerable to antibiotic-resistant infections and undermines the benefits of antibiotics for others.   Waste of  Money: Antibiotics often aren't very expensive, but any money spent on unnecessary drugs is money down the drain.   When are antibiotics needed? Only when symptoms last longer than a week.  Start to improve but then worsen again  -It can take up to 2 weeks to feel better.   -If you do not get better in 7-10 days (Have fever, facial pain, dental pain and swelling), then please call the office and it is now appropriate to start an antibiotic.   -Please take Tylenol or Ibuprofen for pain. -Acetaminiphen 325mg  orally every 4-6 hours for pain.  Max: 10 per day -Ibuprofen 200mg  orally every 6-8 hours for pain.  Take with food to avoid ulcers.   Max 10 per day  Please pick one of the over the counter allergy medications below and take it once daily for allergies.  Claritin or loratadine cheapest but likely the weakest  Zyrtec or certizine at night because it can make you sleepy The strongest is allegra or fexafinadine  Cheapest at [employer], [employer], [employer]  -While drinking fluids, pinch and hold nose close and swallow.  This will help open up your eustachian tubes to drain the fluid behind your ear drums. -Try steam showers to open your nasal passages.   Drink lots of water to stay hydrated and to thin mucous.  Flonase/Nasonex is to help the inflammation.  Take 2 sprays in each nostril at bedtime.  Make sure you spray towards the outside of each nostril towards the outer corner of your eye, hold nose close and tilt head back.  This will help the medication get into your sinuses.  If you do not like this medication, then use saline nasal sprays same directions as above for Flonase. Stop the medication right away if you get blurring of your vision or nose bleeds.  Sinusitis Sinusitis is redness, soreness, and inflammation of the paranasal sinuses. Paranasal sinuses are air pockets within the bones of your face (beneath the eyes, the middle of the forehead, or above the eyes). In healthy paranasal  sinuses, mucus is able to drain out, and air is able to circulate through them by way of your nose. However, when your paranasal sinuses are inflamed, mucus and air can become trapped. This can allow bacteria and other germs to grow and cause infection. Sinusitis can develop quickly and last only a short time (acute) or continue over a long period (chronic). Sinusitis that lasts for more than 12 weeks is considered chronic.  CAUSES  Causes of sinusitis include: Allergies. Structural abnormalities, such as displacement of the cartilage that separates your nostrils (deviated septum), which can decrease the air flow through your nose and sinuses and affect sinus drainage. Functional abnormalities, such as when the small hairs (cilia) that line your sinuses and help remove mucus do not work properly or are not present. SIGNS AND SYMPTOMS  Symptoms of acute and chronic sinusitis are the same. The primary symptoms are pain and pressure around the affected sinuses. Other symptoms include: Upper toothache. Earache. Headache. Bad breath. Decreased sense of smell and taste. A cough, which worsens when you are lying flat. Fatigue. Fever. Thick drainage from your nose, which often is green and may contain pus (purulent). Swelling and warmth over the affected sinuses. DIAGNOSIS  Your health care provider will perform a physical  exam. During the exam, your health care provider may: Look in your nose for signs of abnormal growths in your nostrils (nasal polyps).  Tap over the affected sinus to check for signs of infection. View the inside of your sinuses (endoscopy) using an imaging device that has a light attached (endoscope). If your health care provider suspects that you have chronic sinusitis, one or more of the following tests may be recommended: Allergy tests. Nasal culture. A sample of mucus is taken from your nose, sent to a lab, and screened for bacteria. Nasal cytology. A sample of mucus is  taken from your nose and examined by your health care provider to determine if your sinusitis is related to an allergy. TREATMENT  Most cases of acute sinusitis are related to a viral infection and will resolve on their own within 10 days. Sometimes medicines are prescribed to help relieve symptoms (pain medicine, decongestants, nasal steroid sprays, or saline sprays).  However, for sinusitis related to a bacterial infection, your health care provider will prescribe antibiotic medicines. These are medicines that will help kill the bacteria causing the infection.  Rarely, sinusitis is caused by a fungal infection. In theses cases, your health care provider will prescribe antifungal medicine. For some cases of chronic sinusitis, surgery is needed. Generally, these are cases in which sinusitis recurs more than 3 times per year, despite other treatments. HOME CARE INSTRUCTIONS  Drink plenty of water. Water helps thin the mucus so your sinuses can drain more easily. Use a humidifier. Inhale steam 3 to 4 times a day (for example, sit in the bathroom with the shower running). Apply a warm, moist washcloth to your face 3 to 4 times a day, or as directed by your health care provider. Use saline nasal sprays to help moisten and clean your sinuses. Take medicines only as directed by your health care provider. If you were prescribed either an antibiotic or antifungal medicine, finish it all even if you start to feel better. SEEK IMMEDIATE MEDICAL CARE IF: You have increasing pain or severe headaches. You have nausea, vomiting, or drowsiness. You have swelling around your face. You have vision problems. You have a stiff neck. You have difficulty breathing. MAKE SURE YOU:  Understand these instructions. Will watch your condition. Will get help right away if you are not doing well or get worse. Document Released: 09/09/2005 Document Revised: 01/24/2014 Document Reviewed: 09/24/2011 [facility] Patient  Information 2015 [place], [place]. This information is not intended to replace advice given to you by your health care provider. Make sure you discuss any questions you have with your health care provider.   Diabetes is a very complicated disease...lets simplify it.  An easy way to look at it to understand the complications is if you think of the extra sugar floating in your blood stream as glass shards floating through your blood stream.    Diabetes affects your small vessels first: 1) The glass shards (sugar) scraps down the tiny blood vessels in your eyes and lead to diabetic retinopathy, the leading cause of blindness in the [place]. Diabetes is the leading cause of newly diagnosed adult (59 to 62 years of age) blindness in the [place].  2) The glass shards scratches down the tiny vessels of your legs leading to nerve damage called neuropathy and can lead to amputations of your feet. More than 60% of all non-traumatic amputations of lower limbs occur in people with diabetes.  3) Over time the small vessels in your brain are  shredded and closed off, individually this does not cause any problems but over a long period of time many of the small vessels being blocked can lead to Vascular Dementia.   4) Your kidney's are a filter system and have a "net" that keeps certain things in the body and lets bad things out. Sugar shreds this net and leads to kidney damage and eventually failure. Decreasing the sugar that is destroying the net and certain blood pressure medications can help stop or decrease progression of kidney disease. Diabetes was the primary cause of kidney failure in 44 percent of all new cases in 2011.  5) Diabetes also destroys the small vessels in your penis that lead to erectile dysfunction. Eventually the vessels are so damaged that you may not be responsive to cialis or viagra.   Diabetes and your large vessels: Your larger vessels consist of your coronary arteries in your heart  and the carotid vessels to your brain. Diabetes or even increased sugars put you at 300% increased risk of heart attack and stroke and this is why.. The sugar scrapes down your large blood vessels and your body sees this as an internal injury and tries to repair itself. Just like you get a scab on your skin, your platelets will stick to the blood vessel wall trying to heal it. This is why we have diabetics on low dose aspirin daily, this prevents the platelets from sticking and can prevent plaque formation. In addition, your body takes cholesterol and tries to shove it into the open wound. This is why we want your LDL, or bad cholesterol, below 70.   The combination of platelets and cholesterol over 5-10 years forms plaque that can break off and cause a heart attack or stroke.   PLEASE REMEMBER:  Diabetes is preventable! Up to 18 percent of complications and morbidities among individuals with type 2 diabetes can be prevented, delayed, or effectively treated and minimized with regular visits to a health professional, appropriate monitoring and medication, and a healthy diet and lifestyle.

## 2015-10-24 ENCOUNTER — Encounter: Payer: Self-pay | Admitting: Physician Assistant

## 2015-11-15 ENCOUNTER — Ambulatory Visit: Payer: Self-pay | Admitting: Physician Assistant

## 2016-04-30 ENCOUNTER — Ambulatory Visit (INDEPENDENT_AMBULATORY_CARE_PROVIDER_SITE_OTHER): Payer: BC Managed Care – PPO | Admitting: *Deleted

## 2016-04-30 DIAGNOSIS — Z111 Encounter for screening for respiratory tuberculosis: Secondary | ICD-10-CM

## 2016-04-30 NOTE — Progress Notes (Signed)
Patient received a PPD in her left forearm and will result in 48-72 hours.

## 2016-05-02 LAB — TB SKIN TEST
Induration: 0 mm
TB SKIN TEST: NEGATIVE

## 2016-05-10 ENCOUNTER — Ambulatory Visit: Payer: Self-pay | Admitting: Physician Assistant

## 2016-05-17 ENCOUNTER — Ambulatory Visit: Payer: Self-pay | Admitting: Physician Assistant

## 2016-10-21 ENCOUNTER — Other Ambulatory Visit: Payer: Self-pay | Admitting: Orthopedic Surgery

## 2016-10-21 DIAGNOSIS — R52 Pain, unspecified: Secondary | ICD-10-CM

## 2016-10-23 ENCOUNTER — Ambulatory Visit
Admission: RE | Admit: 2016-10-23 | Discharge: 2016-10-23 | Disposition: A | Discharge status: Home / Self Care | Payer: BC Managed Care – PPO | Source: Ambulatory Visit | Attending: Orthopedic Surgery | Admitting: Orthopedic Surgery

## 2016-10-23 DIAGNOSIS — R52 Pain, unspecified: Secondary | ICD-10-CM

## 2016-12-19 ENCOUNTER — Other Ambulatory Visit: Payer: Self-pay | Admitting: Orthopedic Surgery

## 2016-12-19 ENCOUNTER — Encounter (HOSPITAL_BASED_OUTPATIENT_CLINIC_OR_DEPARTMENT_OTHER): Payer: Self-pay | Admitting: *Deleted

## 2016-12-24 ENCOUNTER — Ambulatory Visit (INDEPENDENT_AMBULATORY_CARE_PROVIDER_SITE_OTHER): Payer: BC Managed Care – PPO | Admitting: Internal Medicine

## 2016-12-24 ENCOUNTER — Other Ambulatory Visit: Payer: Self-pay | Admitting: *Deleted

## 2016-12-24 ENCOUNTER — Encounter: Payer: Self-pay | Admitting: Internal Medicine

## 2016-12-24 VITALS — BP 166/78 | HR 84 | Temp 97.3°F | Resp 16 | Ht 61.0 in | Wt 135.8 lb

## 2016-12-24 DIAGNOSIS — Z794 Long term (current) use of insulin: Secondary | ICD-10-CM | POA: Diagnosis not present

## 2016-12-24 DIAGNOSIS — E782 Mixed hyperlipidemia: Secondary | ICD-10-CM | POA: Diagnosis not present

## 2016-12-24 DIAGNOSIS — E119 Type 2 diabetes mellitus without complications: Secondary | ICD-10-CM | POA: Diagnosis not present

## 2016-12-24 DIAGNOSIS — R300 Dysuria: Secondary | ICD-10-CM

## 2016-12-24 DIAGNOSIS — E559 Vitamin D deficiency, unspecified: Secondary | ICD-10-CM | POA: Diagnosis not present

## 2016-12-24 DIAGNOSIS — J324 Chronic pansinusitis: Secondary | ICD-10-CM

## 2016-12-24 DIAGNOSIS — Z79899 Other long term (current) drug therapy: Secondary | ICD-10-CM

## 2016-12-24 DIAGNOSIS — Z9119 Patient's noncompliance with other medical treatment and regimen: Secondary | ICD-10-CM | POA: Diagnosis not present

## 2016-12-24 DIAGNOSIS — I10 Essential (primary) hypertension: Secondary | ICD-10-CM | POA: Diagnosis not present

## 2016-12-24 DIAGNOSIS — J301 Allergic rhinitis due to pollen: Secondary | ICD-10-CM | POA: Diagnosis not present

## 2016-12-24 DIAGNOSIS — Z91199 Patient's noncompliance with other medical treatment and regimen due to unspecified reason: Secondary | ICD-10-CM

## 2016-12-24 LAB — URINALYSIS, ROUTINE W REFLEX MICROSCOPIC
Bilirubin Urine: NEGATIVE
Glucose, UA: NEGATIVE
Hgb urine dipstick: NEGATIVE
Ketones, ur: NEGATIVE
Nitrite: NEGATIVE
Protein, ur: NEGATIVE
Specific Gravity, Urine: 1.006 (ref 1.001–1.035)
pH: 7.5 (ref 5.0–8.0)

## 2016-12-24 LAB — CBC WITH DIFFERENTIAL/PLATELET
BASOS ABS: 176 {cells}/uL (ref 0–200)
Basophils Relative: 2 %
EOS PCT: 1 %
Eosinophils Absolute: 88 cells/uL (ref 15–500)
HCT: 40.5 % (ref 35.0–45.0)
Hemoglobin: 12.1 g/dL (ref 11.7–15.5)
LYMPHS ABS: 4312 {cells}/uL — AB (ref 850–3900)
Lymphocytes Relative: 49 %
MCH: 26.2 pg — AB (ref 27.0–33.0)
MCHC: 29.9 g/dL — AB (ref 32.0–36.0)
MCV: 87.7 fL (ref 80.0–100.0)
MONOS PCT: 7 %
MPV: 11.2 fL (ref 7.5–12.5)
Monocytes Absolute: 616 cells/uL (ref 200–950)
Neutro Abs: 3608 cells/uL (ref 1500–7800)
Neutrophils Relative %: 41 %
PLATELETS: 430 10*3/uL — AB (ref 140–400)
RBC: 4.62 MIL/uL (ref 3.80–5.10)
RDW: 14.6 % (ref 11.0–15.0)
WBC: 8.8 10*3/uL (ref 3.8–10.8)

## 2016-12-24 LAB — TSH: TSH: 1.86 mIU/L

## 2016-12-24 MED ORDER — LOSARTAN POTASSIUM-HCTZ 50-12.5 MG PO TABS: ORAL_TABLET | ORAL | 2 refills | Status: DC

## 2016-12-24 MED ORDER — FLUTICASONE PROPIONATE 50 MCG/ACT NA SUSP: NASAL | 2 refills | Status: DC

## 2016-12-24 MED ORDER — MONTELUKAST SODIUM 10 MG PO TABS: 10.0000 mg | ORAL_TABLET | Freq: Every day | ORAL | 2 refills | Status: DC

## 2016-12-24 MED ORDER — ONETOUCH VERIO W/DEVICE KIT: 1.0000 | PACK | Freq: Three times a day (TID) | 0 refills | Status: DC

## 2016-12-24 MED ORDER — FREESTYLE LANCETS MISC: 1 refills | Status: DC

## 2016-12-24 MED ORDER — AZITHROMYCIN 250 MG PO TABS: ORAL_TABLET | ORAL | 0 refills | Status: DC

## 2016-12-24 MED ORDER — ONETOUCH ULTRASOFT LANCETS MISC: 1 refills | Status: DC

## 2016-12-24 MED ORDER — GLUCOSE BLOOD VI STRP: ORAL_STRIP | 1 refills | Status: DC

## 2016-12-24 MED ORDER — LOSARTAN POTASSIUM-HCTZ 100-25 MG PO TABS: ORAL_TABLET | ORAL | 2 refills | Status: DC

## 2016-12-24 NOTE — Progress Notes (Signed)
This very nice 61 y.o. MWF presents belatedly for follow up with last OV in Oct 2016. Patient has long hx/o poor compliance and f/u and presents today only for Rx refills.  She has been followed for Hypertension, Hyperlipidemia, Pre-Diabetes and Vitamin D Deficiency.      Patient is treated for HTN since 2001 & BP has not been monitored at home. Today's BP is elevated at 166/78 and rechecked at 173/93.  Patient has had no complaints of any cardiac type chest pain, palpitations, dyspnea/orthopnea/PND, dizziness, claudication, or dependent edema.     Hyperlipidemia when last checked was  controlled with diet. Last Lipids were at goal: Lab Results  Component Value Date   CHOL 160 05/19/2015   HDL 60 05/19/2015   LDLCALC 76 05/19/2015   TRIG 118 05/19/2015   CHOLHDL 2.7 05/19/2015      Also, the patient has history of T2_NIDDM since 2006 and has required insulin with her Metformin for for control. She denies symptoms of reactive hypoglycemia, diabetic polys, paresthesias or visual blurring.  She reports occasionally checked CBG's usu range in the 200's. Last A1c was not at goal when she was hospitalized and she has not had office f/u since: Lab Results  Component Value Date   HGBA1C 15.4 (H) 05/19/2015      Further, the patient also has history of Vitamin D Deficiency and her compliance likewise toward taking supplements of Vit has been poor. Last vitamin D was very low:  Lab Results  Component Value Date   VD25OH 35 05/19/2015   Current Outpatient Prescriptions on File Prior to Visit  Medication Sig  . acetaminophen  325 MG Take 650 mg by mouth every 6 (six) hours as needed.  . SUPER B COMPLEX Take 1 tablet by mouth daily.  Marland Kitchen NOVOLIN 70/30 Take 15 u qam & 19 u qpm  . metFORMIN -XR 500 MG  TAKE ONE TAB 3 x / DAILY  . omeprazole  20 MG  Take 20 mg by mouth daily.  . traMADol  50 MG  Take by mouth 2 (two) times daily.  Marland Kitchen aspirin EC 81 MG  Take 81 mg by mouth at bedtime.   Marland Kitchen VITAMIN D   Take 5,000 Units  2 x daily - says off last 2 weeks   Allergies  Allergen Reactions  . Penicillins   . Pepcid [Famotidine] Other (See Comments)    HEADACHE  . Prevacid [Lansoprazole] Diarrhea   PMHx:   Past Medical History:  Diagnosis Date  . Cancer ([place]) 1977   CERVICAL  . GERD (gastroesophageal reflux disease) 11/14/2013  . Gout   . Mixed hyperlipidemia 11/14/2013   Immunization History  Administered Date(s) Administered  . Influenza Split 06/20/2014, 07/11/2015  . Influenza Whole 06/17/2013  . PPD Test 01/18/2015, 04/30/2016  . Pneumococcal Conjugate-13 05/19/2015  . Pneumococcal-Unspecified 09/23/2009  . Tdap 09/23/2006   Past Surgical History:  Procedure Laterality Date  . 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 CHOLECYSTECTOMY    . EYE SURGERY     6/3 LASER FOR GLAUCOMA, 9/03 CE/IOL IMPLANTS  . pancreatectomy    . SHOULDER CLOSED REDUCTION Left 02/03/2013   Procedure: CLOSED MANIPULATION SHOULDER;  Surgeon: Vickey Huger, MD;  Location: [facility];  Service: Orthopedics;  Laterality: Left;  CLOSED MANIPULATION LEFT SHOULDER  . SPLENECTOMY    . TONSILLECTOMY  1970   FHx:    Reviewed / unchanged  SHx:    Reviewed / unchanged  Systems Review:  Constitutional: Denies fever, chills, wt changes, headaches, insomnia, fatigue, night sweats, change in appetite. Eyes: Denies redness, blurred vision, diplopia, discharge, itchy, watery eyes.  ENT: Denies discharge, congestion, post nasal drip, epistaxis, sore throat, earache, hearing loss, dental pain, tinnitus, vertigo, sinus pain, snoring.  CV: Denies chest pain, palpitations, irregular heartbeat, syncope, dyspnea, diaphoresis, orthopnea, PND, claudication or edema. Respiratory: denies cough, dyspnea, DOE, pleurisy, hoarseness, laryngitis, wheezing.  Gastrointestinal: Denies dysphagia, odynophagia, heartburn, reflux, water brash, abdominal pain or cramps, nausea, vomiting, bloating, diarrhea, constipation, hematemesis,  melena, hematochezia  or hemorrhoids. Genitourinary: Denies, frequency, urgency, nocturia, hesitancy, discharge, hematuria or flank pain. Does c/o dysuria. Musculoskeletal: Denies arthralgias, myalgias, stiffness, jt. swelling, pain, limping or strain/sprain.  Skin: Denies pruritus, rash, hives, warts, acne, eczema or change in skin lesion(s). Neuro: No weakness, tremor, incoordination, spasms, paresthesia or pain. Psychiatric: Denies confusion, memory loss or sensory loss. Endo: Denies change in weight, skin or hair change.  Heme/Lymph: No excessive bleeding, bruising or enlarged lymph nodes.  Physical Exam  BP - Re Ck 173/93 166/78   P 84   T 97.3 F    R 16   Ht 5\' 1"    Wt 135 lb 12.8 oz    BMI 25.66    Appears well nourished, well groomed  and in no distress.  Eyes: PERRLA, EOMs, conjunctiva no swelling or erythema. Sinuses: No frontal/maxillary tenderness ENT/Mouth: EAC's clear, TM's nl w/o erythema, bulging. Nares clear w/o erythema, swelling, exudates. Oropharynx clear without erythema or exudates. Oral hygiene is good. Tongue normal, non obstructing. Hearing intact.  Neck: Supple. Thyroid nl. Car 2+/2+ without bruits, nodes or JVD. Chest: Respirations nl with BS clear & equal w/o rales, rhonchi, wheezing or stridor.  Cor: Heart sounds normal w/ regular rate and rhythm without sig. murmurs, gallops, clicks or rubs. Peripheral pulses normal and equal  without edema.  Abdomen: Soft & bowel sounds normal. Non-tender w/o guarding, rebound, hernias, masses or organomegaly.  Lymphatics: Unremarkable.  Musculoskeletal: Full ROM all peripheral extremities, joint stability, 5/5 strength and normal gait.  Skin: Warm, dry without exposed rashes, lesions or ecchymosis apparent.  Neuro: Cranial nerves intact, reflexes equal bilaterally. Sensory-motor testing grossly intact. Tendon reflexes grossly intact.  Pysch: Alert & oriented x 3.  Insight and judgement nl & appropriate. No  ideations.  Assessment and Plan:  1. Essential hypertension  - Continue medication, monitor blood pressure at home.  - Continue DASH diet. Reminder to go to the ER if any CP,  SOB, nausea, dizziness, severe HA, changes vision/speech,   left arm numbness and tingling and jaw pain.  - CBC with Differential/Platelet - BASIC METABOLIC PANEL WITH GFR - Magnesium - TSH - losartan-hydrochlorothiazide (HYZAAR) 100-25 MG tablet; Take 1/2 to 1 tablet daily for BP  Dispense: 30 tablet; Refill: 2  2. Mixed hyperlipidemia  - Continue diet/meds, exercise,& lifestyle modifications.  - Continue monitor periodic cholesterol/liver & renal functions   - Hepatic function panel - Lipid panel - TSH  3. Insulin Requiring T2_DM  - Continue diet, exercise, lifestyle modifications.  - Monitor appropriate labs. - Hemoglobin A1c  4. Vitamin D deficiency  - Encouraged supplementation.  5. Non-compliance with treatment   6. Pansinusitis  - azithromycin (ZITHROMAX) 250 MG tablet; Take 2 tablets (500 mg) on  Day 1,  followed by 1 tablet (250 mg) once daily on Days 2 through 5.  Dispense: 6 each; Refill: 0  7. Acute seasonal allergic rhinitis due to pollen  - montelukast (SINGULAIR) 10  MG tablet; Take 1 tablet (10 mg total) by mouth daily.  Dispense: 30 tablet; Refill: 2 - fluticasone (FLONASE) 50 MCG/ACT nasal spray; 1 to 2 sprays each nostril 2 x /daily  Dispense: 16 g; Refill: 2  8. Dysuria  - Urinalysis, Routine w reflex microscopic - Urine culture  9. Medication management  - CBC with Differential/Platelet - BASIC METABOLIC PANEL WITH GFR - Hepatic function panel - Magnesium - Lipid panel - TSH - Hemoglobin A1c       Discussed  regular exercise, BP monitoring, weight control to achieve/maintain BMI less than 25 and discussed med and SE's. Recommended labs to assess and monitor clinical status with further disposition pending results of labs. Over 30 minutes of exam, counseling,  chart review was performed.

## 2016-12-24 NOTE — Patient Instructions (Signed)

## 2016-12-25 ENCOUNTER — Other Ambulatory Visit: Payer: Self-pay | Admitting: Internal Medicine

## 2016-12-25 ENCOUNTER — Encounter: Payer: Self-pay | Admitting: *Deleted

## 2016-12-25 LAB — BASIC METABOLIC PANEL WITH GFR
BUN: 18 mg/dL (ref 7–25)
CALCIUM: 9.9 mg/dL (ref 8.6–10.4)
CO2: 32 mmol/L — ABNORMAL HIGH (ref 20–31)
CREATININE: 0.59 mg/dL (ref 0.50–0.99)
Chloride: 102 mmol/L (ref 98–110)
GFR, Est Non African American: >89 mL/min (ref >=60)
Glucose, Bld: 48 mg/dL — ABNORMAL LOW (ref 65–99)
Potassium: 5.3 mmol/L (ref 3.5–5.3)
SODIUM: 145 mmol/L (ref 135–146)

## 2016-12-25 LAB — HEPATIC FUNCTION PANEL
ALK PHOS: 157 U/L — AB (ref 33–130)
ALT: 22 U/L (ref 6–29)
AST: 21 U/L (ref 10–35)
Albumin: 4.1 g/dL (ref 3.6–5.1)
BILIRUBIN DIRECT: 0.1 mg/dL (ref <=0.2)
BILIRUBIN INDIRECT: 0.2 mg/dL (ref 0.2–1.2)
BILIRUBIN TOTAL: 0.3 mg/dL (ref 0.2–1.2)
TOTAL PROTEIN: 6.8 g/dL (ref 6.1–8.1)

## 2016-12-25 LAB — URINALYSIS, MICROSCOPIC ONLY
BACTERIA UA: NONE SEEN [HPF]
CASTS: NONE SEEN [LPF]
Crystals: NONE SEEN [HPF]
RBC / HPF: NONE SEEN RBC/HPF (ref <=2)
SQUAMOUS EPITHELIAL / LPF: NONE SEEN [HPF] (ref <=5)
WBC, UA: NONE SEEN WBC/HPF (ref <=5)
YEAST: NONE SEEN [HPF]

## 2016-12-25 LAB — LIPID PANEL
CHOL/HDL RATIO: 2.4 ratio (ref <5.0)
Cholesterol: 148 mg/dL (ref <200)
HDL: 61 mg/dL (ref >50)
LDL CALC: 63 mg/dL (ref <100)
Triglycerides: 121 mg/dL (ref <150)
VLDL: 24 mg/dL (ref <30)

## 2016-12-25 LAB — HEMOGLOBIN A1C
HEMOGLOBIN A1C: 13.7 % — AB (ref <5.7)
MEAN PLASMA GLUCOSE: 346 mg/dL

## 2016-12-25 LAB — MAGNESIUM: Magnesium: 1.7 mg/dL (ref 1.5–2.5)

## 2016-12-25 NOTE — Progress Notes (Signed)
Dr. Jenita Seashore reviewed labs, will proceed with surgery as scheduled.

## 2016-12-26 SURGERY — Surgical Case
Anesthesia: *Unknown

## 2016-12-27 ENCOUNTER — Ambulatory Visit (HOSPITAL_BASED_OUTPATIENT_CLINIC_OR_DEPARTMENT_OTHER): Payer: BC Managed Care – PPO | Admitting: Certified Registered"

## 2016-12-27 ENCOUNTER — Other Ambulatory Visit: Payer: Self-pay | Admitting: Internal Medicine

## 2016-12-27 ENCOUNTER — Encounter (HOSPITAL_BASED_OUTPATIENT_CLINIC_OR_DEPARTMENT_OTHER)
Admission: RE | Disposition: A | Discharge status: Home / Self Care | Payer: Self-pay | Source: Ambulatory Visit | Attending: Orthopedic Surgery

## 2016-12-27 ENCOUNTER — Encounter (HOSPITAL_BASED_OUTPATIENT_CLINIC_OR_DEPARTMENT_OTHER): Payer: Self-pay | Admitting: Certified Registered"

## 2016-12-27 ENCOUNTER — Ambulatory Visit (HOSPITAL_BASED_OUTPATIENT_CLINIC_OR_DEPARTMENT_OTHER)
Admission: RE | Admit: 2016-12-27 | Discharge: 2016-12-27 | Disposition: A | Discharge status: Home / Self Care | Payer: BC Managed Care – PPO | Source: Ambulatory Visit | Attending: Orthopedic Surgery | Admitting: Orthopedic Surgery

## 2016-12-27 DIAGNOSIS — K219 Gastro-esophageal reflux disease without esophagitis: Secondary | ICD-10-CM | POA: Diagnosis not present

## 2016-12-27 DIAGNOSIS — M7542 Impingement syndrome of left shoulder: Secondary | ICD-10-CM | POA: Diagnosis present

## 2016-12-27 DIAGNOSIS — E119 Type 2 diabetes mellitus without complications: Secondary | ICD-10-CM | POA: Insufficient documentation

## 2016-12-27 DIAGNOSIS — Z8541 Personal history of malignant neoplasm of cervix uteri: Secondary | ICD-10-CM | POA: Diagnosis not present

## 2016-12-27 DIAGNOSIS — Z79899 Other long term (current) drug therapy: Secondary | ICD-10-CM | POA: Diagnosis not present

## 2016-12-27 DIAGNOSIS — I10 Essential (primary) hypertension: Secondary | ICD-10-CM | POA: Diagnosis not present

## 2016-12-27 DIAGNOSIS — Z87891 Personal history of nicotine dependence: Secondary | ICD-10-CM | POA: Insufficient documentation

## 2016-12-27 DIAGNOSIS — X58XXXA Exposure to other specified factors, initial encounter: Secondary | ICD-10-CM | POA: Insufficient documentation

## 2016-12-27 DIAGNOSIS — M19012 Primary osteoarthritis, left shoulder: Secondary | ICD-10-CM | POA: Insufficient documentation

## 2016-12-27 DIAGNOSIS — Z794 Long term (current) use of insulin: Secondary | ICD-10-CM | POA: Diagnosis not present

## 2016-12-27 DIAGNOSIS — Z7951 Long term (current) use of inhaled steroids: Secondary | ICD-10-CM | POA: Diagnosis not present

## 2016-12-27 DIAGNOSIS — Z7982 Long term (current) use of aspirin: Secondary | ICD-10-CM | POA: Insufficient documentation

## 2016-12-27 DIAGNOSIS — S46812A Strain of other muscles, fascia and tendons at shoulder and upper arm level, left arm, initial encounter: Secondary | ICD-10-CM | POA: Diagnosis not present

## 2016-12-27 LAB — URINE CULTURE

## 2016-12-27 LAB — GLUCOSE, CAPILLARY
Glucose-Capillary: 90 mg/dL (ref 65–99)
Glucose-Capillary: 135 mg/dL — ABNORMAL HIGH (ref 65–99)

## 2016-12-27 SURGERY — SHOULDER ARTHROSCOPY WITH SUBACROMIAL DECOMPRESSION AND DISTAL CLAVICLE EXCISION
Anesthesia: Regional | Site: Shoulder | Laterality: Left

## 2016-12-27 MED ORDER — DEXAMETHASONE SODIUM PHOSPHATE 10 MG/ML IJ SOLN
INTRAMUSCULAR | Status: AC
  Filled 2016-12-27: qty 1

## 2016-12-27 MED ORDER — CEFAZOLIN SODIUM-DEXTROSE 2-4 GM/100ML-% IV SOLN
INTRAVENOUS | Status: AC
  Filled 2016-12-27: qty 100

## 2016-12-27 MED ORDER — LACTATED RINGERS IV SOLN
INTRAVENOUS | Status: DC
  Administered 2016-12-27 (×2): via INTRAVENOUS

## 2016-12-27 MED ORDER — PROPOFOL 10 MG/ML IV BOLUS
INTRAVENOUS | Status: AC
  Filled 2016-12-27: qty 20

## 2016-12-27 MED ORDER — HYDROMORPHONE HCL 1 MG/ML IJ SOLN
0.2500 mg | INTRAMUSCULAR | Status: DC | PRN
  Administered 2016-12-27 (×2): 0.25 mg via INTRAVENOUS

## 2016-12-27 MED ORDER — SODIUM CHLORIDE 0.9 % IR SOLN
Status: DC | PRN
  Administered 2016-12-27: 3000 mL

## 2016-12-27 MED ORDER — MEPERIDINE HCL 25 MG/ML IJ SOLN: 6.2500 mg | INTRAMUSCULAR | Status: DC | PRN

## 2016-12-27 MED ORDER — FENTANYL CITRATE (PF) 100 MCG/2ML IJ SOLN
50.0000 ug | INTRAMUSCULAR | Status: DC | PRN
  Administered 2016-12-27: 50 ug via INTRAVENOUS

## 2016-12-27 MED ORDER — OXYCODONE HCL 5 MG/5ML PO SOLN: 5.0000 mg | Freq: Once | ORAL | Status: DC | PRN

## 2016-12-27 MED ORDER — LIDOCAINE HCL (CARDIAC) 20 MG/ML IV SOLN
INTRAVENOUS | Status: DC | PRN
  Administered 2016-12-27: 30 mg via INTRAVENOUS

## 2016-12-27 MED ORDER — PROPOFOL 10 MG/ML IV BOLUS
INTRAVENOUS | Status: DC | PRN
  Administered 2016-12-27: 150 mg via INTRAVENOUS

## 2016-12-27 MED ORDER — SUCCINYLCHOLINE CHLORIDE 20 MG/ML IJ SOLN
INTRAMUSCULAR | Status: DC | PRN
  Administered 2016-12-27: 100 mg via INTRAVENOUS

## 2016-12-27 MED ORDER — DEXAMETHASONE SODIUM PHOSPHATE 4 MG/ML IJ SOLN
INTRAMUSCULAR | Status: DC | PRN
  Administered 2016-12-27: 10 mg via INTRAVENOUS

## 2016-12-27 MED ORDER — ONDANSETRON HCL 4 MG/2ML IJ SOLN
INTRAMUSCULAR | Status: AC
  Filled 2016-12-27: qty 2

## 2016-12-27 MED ORDER — ONDANSETRON HCL 4 MG/2ML IJ SOLN
INTRAMUSCULAR | Status: DC | PRN
  Administered 2016-12-27: 4 mg via INTRAVENOUS

## 2016-12-27 MED ORDER — SUCCINYLCHOLINE CHLORIDE 200 MG/10ML IV SOSY
PREFILLED_SYRINGE | INTRAVENOUS | Status: AC
  Filled 2016-12-27: qty 10

## 2016-12-27 MED ORDER — SCOPOLAMINE 1 MG/3DAYS TD PT72: 1.0000 | MEDICATED_PATCH | Freq: Once | TRANSDERMAL | Status: DC | PRN

## 2016-12-27 MED ORDER — ROPIVACAINE HCL 5 MG/ML IJ SOLN
INTRAMUSCULAR | Status: DC | PRN
  Administered 2016-12-27: 30 mL via PERINEURAL

## 2016-12-27 MED ORDER — FENTANYL CITRATE (PF) 100 MCG/2ML IJ SOLN
INTRAMUSCULAR | Status: AC
  Filled 2016-12-27: qty 2

## 2016-12-27 MED ORDER — HYDROMORPHONE HCL 1 MG/ML IJ SOLN
INTRAMUSCULAR | Status: AC
  Filled 2016-12-27: qty 1

## 2016-12-27 MED ORDER — LIDOCAINE 2% (20 MG/ML) 5 ML SYRINGE
INTRAMUSCULAR | Status: AC
  Filled 2016-12-27: qty 5

## 2016-12-27 MED ORDER — PROMETHAZINE HCL 25 MG/ML IJ SOLN: 6.2500 mg | INTRAMUSCULAR | Status: DC | PRN

## 2016-12-27 MED ORDER — MIDAZOLAM HCL 2 MG/2ML IJ SOLN
1.0000 mg | INTRAMUSCULAR | Status: DC | PRN
  Administered 2016-12-27: 2 mg via INTRAVENOUS

## 2016-12-27 MED ORDER — OXYCODONE HCL 5 MG PO TABS
5.0000 mg | ORAL_TABLET | Freq: Once | ORAL | Status: DC | PRN
Start: 2016-12-27 — End: 2016-12-27

## 2016-12-27 MED ORDER — SULFAMETHOXAZOLE-TRIMETHOPRIM 800-160 MG PO TABS: ORAL_TABLET | ORAL | 0 refills | Status: AC

## 2016-12-27 MED ORDER — CHLORHEXIDINE GLUCONATE 4 % EX LIQD: 60.0000 mL | Freq: Once | CUTANEOUS | Status: DC

## 2016-12-27 MED ORDER — MIDAZOLAM HCL 2 MG/2ML IJ SOLN
INTRAMUSCULAR | Status: AC
  Filled 2016-12-27: qty 2

## 2016-12-27 MED ORDER — CEFAZOLIN SODIUM-DEXTROSE 2-4 GM/100ML-% IV SOLN
2.0000 g | INTRAVENOUS | Status: AC
  Administered 2016-12-27: 2 g via INTRAVENOUS

## 2016-12-27 SURGICAL SUPPLY — 86 items
AID PSTN UNV HD RSTRNT DISP (MISCELLANEOUS) ×1
BLADE AVERAGE 25MMX9MM (BLADE)
BLADE AVERAGE 25X9 (BLADE) IMPLANT
BLADE CUTTER GATOR 3.5 (BLADE) ×3 IMPLANT
BLADE GREAT WHITE 4.2 (BLADE) IMPLANT
BLADE GREAT WHITE 4.2MM (BLADE)
BLADE SURG 10 STRL SS (BLADE) IMPLANT
BLADE SURG 15 STRL LF DISP TIS (BLADE) IMPLANT
BLADE SURG 15 STRL SS (BLADE)
BUR OVAL 4.0 (BURR) ×3 IMPLANT
CANNULA SHOULDER 7CM (CANNULA) ×3 IMPLANT
CLEANER CAUTERY TIP 5X5 PAD (MISCELLANEOUS) IMPLANT
CLOSURE WOUND 1/2 X4 (GAUZE/BANDAGES/DRESSINGS)
DECANTER SPIKE VIAL GLASS SM (MISCELLANEOUS) IMPLANT
DRAPE U-SHAPE 47X51 STRL (DRAPES) ×3 IMPLANT
DRAPE U-SHAPE 76X120 STRL (DRAPES) ×6 IMPLANT
DRSG EMULSION OIL 3X3 NADH (GAUZE/BANDAGES/DRESSINGS) IMPLANT
DRSG PAD ABDOMINAL 8X10 ST (GAUZE/BANDAGES/DRESSINGS) ×9 IMPLANT
DURAPREP 26ML APPLICATOR (WOUND CARE) ×3 IMPLANT
ELECT MENISCUS 165MM 90D (ELECTRODE) IMPLANT
ELECT NDL TIP 2.8 STRL (NEEDLE) IMPLANT
ELECT NEEDLE TIP 2.8 STRL (NEEDLE) IMPLANT
ELECT REM PT RETURN 9FT ADLT (ELECTROSURGICAL) ×3
ELECTRODE REM PT RTRN 9FT ADLT (ELECTROSURGICAL) ×1 IMPLANT
GAUZE SPONGE 4X4 12PLY STRL (GAUZE/BANDAGES/DRESSINGS) ×3 IMPLANT
GAUZE XEROFORM 1X8 LF (GAUZE/BANDAGES/DRESSINGS) ×3 IMPLANT
GLOVE BIO SURGEON STRL SZ 6.5 (GLOVE) ×1 IMPLANT
GLOVE BIO SURGEONS STRL SZ 6.5 (GLOVE) ×1
GLOVE BIOGEL PI IND STRL 7.0 (GLOVE) IMPLANT
GLOVE BIOGEL PI IND STRL 7.5 (GLOVE) IMPLANT
GLOVE BIOGEL PI IND STRL 8.5 (GLOVE) IMPLANT
GLOVE BIOGEL PI INDICATOR 7.0 (GLOVE) ×4
GLOVE BIOGEL PI INDICATOR 7.5 (GLOVE) ×2
GLOVE BIOGEL PI INDICATOR 8.5 (GLOVE)
GLOVE SURG ORTHO 8.0 STRL STRW (GLOVE) ×6 IMPLANT
GLOVE SURG SS PI 7.5 STRL IVOR (GLOVE) ×2 IMPLANT
GOWN STRL REUS W/ TWL LRG LVL3 (GOWN DISPOSABLE) ×1 IMPLANT
GOWN STRL REUS W/ TWL XL LVL3 (GOWN DISPOSABLE) IMPLANT
GOWN STRL REUS W/TWL LRG LVL3 (GOWN DISPOSABLE) ×6
GOWN STRL REUS W/TWL XL LVL3 (GOWN DISPOSABLE) ×3
MANIFOLD NEPTUNE II (INSTRUMENTS) ×3 IMPLANT
NDL SCORPION MULTI FIRE (NEEDLE) IMPLANT
NDL SUT 6 .5 CRC .975X.05 MAYO (NEEDLE) IMPLANT
NEEDLE MAYO TAPER (NEEDLE)
NEEDLE SCORPION MULTI FIRE (NEEDLE) IMPLANT
NS IRRIG 1000ML POUR BTL (IV SOLUTION) ×2 IMPLANT
PACK ARTHROSCOPY DSU (CUSTOM PROCEDURE TRAY) ×3 IMPLANT
PACK BASIN DAY SURGERY FS (CUSTOM PROCEDURE TRAY) ×3 IMPLANT
PAD CLEANER CAUTERY TIP 5X5 (MISCELLANEOUS)
PENCIL BUTTON HOLSTER BLD 10FT (ELECTRODE) IMPLANT
PROBE BIPOLAR ARTHRO 85MM 30D (MISCELLANEOUS) IMPLANT
PROBE BIPOLAR ATHRO 135MM 90D (MISCELLANEOUS) ×3 IMPLANT
RESTRAINT HEAD UNIVERSAL NS (MISCELLANEOUS) ×3 IMPLANT
SET ARTHROSCOPY TUBING (MISCELLANEOUS) ×3
SET ARTHROSCOPY TUBING LN (MISCELLANEOUS) ×1 IMPLANT
SLEEVE SCD COMPRESS KNEE MED (MISCELLANEOUS) ×3 IMPLANT
SLING ARM FOAM STRAP LRG (SOFTGOODS) IMPLANT
SLING ARM FOAM STRAP MED (SOFTGOODS) ×2 IMPLANT
SLING ARM IMMOBILIZER LRG (SOFTGOODS) IMPLANT
SLING ARM MED ADULT FOAM STRAP (SOFTGOODS) IMPLANT
SLING ARM XL FOAM STRAP (SOFTGOODS) IMPLANT
SPONGE LAP 4X18 X RAY DECT (DISPOSABLE) IMPLANT
STRIP CLOSURE SKIN 1/2X4 (GAUZE/BANDAGES/DRESSINGS) IMPLANT
SUCTION FRAZIER HANDLE 10FR (MISCELLANEOUS)
SUCTION TUBE FRAZIER 10FR DISP (MISCELLANEOUS) IMPLANT
SUT ETHILON 4 0 PS 2 18 (SUTURE) ×3 IMPLANT
SUT FIBERWIRE #2 38 T-5 BLUE (SUTURE)
SUT TIGER TAPE 7 IN WHITE (SUTURE) IMPLANT
SUT VIC AB 0 CT1 18XCR BRD 8 (SUTURE) IMPLANT
SUT VIC AB 0 CT1 27 (SUTURE)
SUT VIC AB 0 CT1 27XBRD ANBCTR (SUTURE) ×4 IMPLANT
SUT VIC AB 0 CT1 8-18 (SUTURE)
SUT VIC AB 2-0 SH 27 (SUTURE)
SUT VIC AB 2-0 SH 27XBRD (SUTURE) IMPLANT
SUT VIC AB 3-0 FS2 27 (SUTURE) IMPLANT
SUTURE FIBERWR #2 38 T-5 BLUE (SUTURE) IMPLANT
SYR 50ML LL SCALE MARK (SYRINGE) ×3 IMPLANT
SYR BULB 3OZ (MISCELLANEOUS) IMPLANT
TAPE CLOTH SILK CARING 2INX10 (GAUZE/BANDAGES/DRESSINGS) ×3 IMPLANT
TAPE FIBER 2MM 7IN #2 BLUE (SUTURE) IMPLANT
TOWEL OR 17X24 6PK STRL BLUE (TOWEL DISPOSABLE) ×3 IMPLANT
TUBE CONNECTING 20'X1/4 (TUBING)
TUBE CONNECTING 20X1/4 (TUBING) IMPLANT
UNDERPAD 30X30 (UNDERPADS AND DIAPERS) ×3 IMPLANT
WATER STERILE IRR 1000ML POUR (IV SOLUTION) ×3 IMPLANT
YANKAUER SUCT BULB TIP NO VENT (SUCTIONS) IMPLANT

## 2016-12-27 NOTE — Anesthesia Postprocedure Evaluation (Signed)
Anesthesia Post Note  Patient: ODDIE KUHLMANN  Procedure(s) Performed: Procedure(s) (LRB): LEFT SHOULDER ARTHROSCOPY WITH SUBACROMIAL DECOMPRESSION AND DISTAL CLAVICLE RESECTION (Left)  Patient location during evaluation: PACU Anesthesia Type: Regional and General Level of consciousness: awake and alert Pain management: pain level controlled Vital Signs Assessment: post-procedure vital signs reviewed and stable Respiratory status: spontaneous breathing, nonlabored ventilation and respiratory function stable Cardiovascular status: blood pressure returned to baseline and stable Postop Assessment: no signs of nausea or vomiting Anesthetic complications: no       Last Vitals:  Vitals:   12/27/16 0900 12/27/16 0915  BP: (!) 166/82 (!) 163/92  Pulse: 87 86  Resp: 11 11  Temp:      Last Pain:  Vitals:   12/27/16 0921  TempSrc:   PainSc: Elliott

## 2016-12-27 NOTE — Op Note (Signed)
Dictation Number:  [id number]

## 2016-12-27 NOTE — Anesthesia Procedure Notes (Signed)
Procedure Name: Intubation Date/Time: 12/27/2016 7:32 AM Performed by: Jonny Longino D Pre-anesthesia Checklist: Patient identified, Emergency Drugs available, Suction available and Patient being monitored Patient Re-evaluated:Patient Re-evaluated prior to inductionOxygen Delivery Method: Circle system utilized Preoxygenation: Pre-oxygenation with 100% oxygen Intubation Type: IV induction Ventilation: Mask ventilation without difficulty Laryngoscope Size: Mac and 3 Grade View: Grade II Tube type: Oral Tube size: 7.0 mm Number of attempts: 1 Airway Equipment and Method: Stylet and Oral airway Placement Confirmation: ETT inserted through vocal cords under direct vision,  positive ETCO2 and breath sounds checked- equal and bilateral Secured at: 21 cm Tube secured with: Tape Dental Injury: Teeth and Oropharynx as per pre-operative assessment

## 2016-12-27 NOTE — Anesthesia Procedure Notes (Signed)
Anesthesia Regional Block: Interscalene brachial plexus block   Pre-Anesthetic Checklist: ,, timeout performed, Correct Patient, Correct Site, Correct Laterality, Correct Procedure, Correct Position, site marked, Risks and benefits discussed,  Surgical consent,  Pre-op evaluation,  At surgeon's request and post-op pain management  Laterality: Left  Prep: chloraprep       Needles:  Injection technique: Single-shot  Needle Type: Stimiplex     Needle Length: 9cm  Needle Gauge: 21     Additional Needles:   Procedures: ultrasound guided,,,,,,,,  Narrative:  Start time: 12/27/2016 7:04 AM End time: 12/27/2016 7:14 AM Injection made incrementally with aspirations every 5 mL.  Performed by: Personally  Anesthesiologist: Candida Peeling RAY

## 2016-12-27 NOTE — Op Note (Signed)
NAME:  KALENNA, MILLETT                     ACCOUNT NO.:  MEDICAL RECORD NO.:  [id number]  LOCATION:                                 FACILITY:  PHYSICIAN:  Estill Bamberg. Ronnie Derby, M.D.      DATE OF BIRTH:  DATE OF PROCEDURE:  12/27/2016 DATE OF DISCHARGE:                              OPERATIVE REPORT   SURGEON:  Estill Bamberg. Sheyanne Munley, MD.  ANESTHESIA:  Preoperative interscalene block and general anesthesia.  PREOPERATIVE DIAGNOSES:  Left shoulder impingement syndrome, acromioclavicular joint arthritis, partial biceps tear.  POSTOPERATIVE DIAGNOSES:  Left shoulder impingement syndrome, acromioclavicular joint arthritis, partial biceps tear.  PROCEDURE:  Left shoulder arthroscopy with subacromial decompression, distal clavicle resection, and glenohumeral extensive debridement.  INDICATION FOR PROCEDURE:  The patient is a 61 year old white female with a remote injury of dislocation.  More recent history of shoulder impingement syndrome and failure to conservative measures with anti- inflammatories, cortisone injections, and physical therapy.  MRI revealed some biceps tendon tearing as well as partial thickness rotator cuff tearing.  Informed consent was obtained.  DESCRIPTION OF PROCEDURE:  The patient was placed supine under general anesthesia and placed in the beach chair position.  Left shoulder was prepped and draped in the usual fashion.  Anterior and posterior direct lateral portals were created with #11 blade, blunt trocar, and cannula. The diagnostic arthroscopy revealed no glenohumeral __________.  There was some degenerative labral tearing, which was cleaned up with a small [employer] shaver.  There was significant biceps tearing along about a 3 cm stretch of biceps, starting 2 cm distal to the insertion on the labrum.  This was debrided with small [place] shaver.  The repair was about 25% of the diameter of biceps along with 3 cm area.  The undersurface of the rotator cuff  __________ partial tearing, but no full- thickness tearing as well as inflammation.  The biceps, rotator cuff, and labrum were all debrided.  I then redirected the scope into the subacromial space from the posterior portal.  From the direct lateral portal, I performed a bursectomy with small [place] shaver.  I then used ArthroCare debridement wand to clean off the undersurface of the acromion and distal clavicle as well as release the CA ligament.  I then used the 4.8 mm cylindrical burr to perform aggressive acromioplasty and distal clavicle resection.  This afforded excellent decompression.  I then debrided the bursal surface of the rotator cuff again with no full- thickness tears.  I then lavaged and closed with 4-0 nylon sutures. Dressed with Xeroform, dressing sponges, and a new sterile dressing, __________ sling.  COMPLICATIONS:  None.  DRAINS:  None.          ______________________________ Estill Bamberg. Ronnie Derby, M.D.     SDL/MEDQ  D:  12/27/2016  T:  12/27/2016  Job:  [id number]

## 2016-12-27 NOTE — Anesthesia Preprocedure Evaluation (Addendum)
Anesthesia Evaluation  Patient identified by MRN, date of birth, ID band Patient awake    Reviewed: Allergy & Precautions, H&P , NPO status , Patient's Chart, lab work & pertinent test results  Airway Mallampati: II  TM Distance: >3 FB Neck ROM: full    Dental  (+) Caps, Dental Advisory Given Left upper front is capped:   Pulmonary neg pulmonary ROS, former smoker,    Pulmonary exam normal breath sounds clear to auscultation       Cardiovascular Exercise Tolerance: Good hypertension, negative cardio ROS Normal cardiovascular exam Rhythm:regular Rate:Normal     Neuro/Psych negative neurological ROS  negative psych ROS   GI/Hepatic negative GI ROS, Neg liver ROS, GERD  Medicated and Controlled,  Endo/Other  negative endocrine ROSdiabetes, Poorly Controlled, Type 2Patient in DKA.  Got insulin to cover in ER a short time ago.  Renal/GU negative Renal ROS  negative genitourinary   Musculoskeletal   Abdominal   Peds  Hematology negative hematology ROS (+)   Anesthesia Other Findings   Reproductive/Obstetrics negative OB ROS                             Anesthesia Physical  Anesthesia Plan  ASA: III  Anesthesia Plan: General and Regional   Post-op Pain Management: GA combined w/ Regional for post-op pain   Induction: Intravenous  Airway Management Planned: Oral ETT  Additional Equipment:   Intra-op Plan:   Post-operative Plan:   Informed Consent: I have reviewed the patients History and Physical, chart, labs and discussed the procedure including the risks, benefits and alternatives for the proposed anesthesia with the patient or authorized representative who has indicated his/her understanding and acceptance.   Dental Advisory Given  Plan Discussed with: CRNA and Surgeon  Anesthesia Plan Comments:        Anesthesia Quick Evaluation

## 2016-12-27 NOTE — Progress Notes (Signed)
Assisted Dr. Sabra Heck with supraclavicular block. Side rails up, monitors on throughout procedure. See vital signs in flow sheet. Tolerated Procedure well.

## 2016-12-27 NOTE — Transfer of Care (Signed)
Immediate Anesthesia Transfer of Care Note  Patient: [name]  Procedure(s) Performed: Procedure(s): LEFT SHOULDER ARTHROSCOPY WITH SUBACROMIAL DECOMPRESSION AND DISTAL CLAVICLE RESECTION (Left)  Patient Location: PACU  Anesthesia Type:GA combined with regional for post-op pain  Level of Consciousness: awake and patient cooperative  Airway & Oxygen Therapy: Patient Spontanous Breathing and Patient connected to face mask oxygen  Post-op Assessment: Report given to RN and Post -op Vital signs reviewed and stable  Post vital signs: Reviewed and stable  Last Vitals:  Vitals:   12/27/16 0715 12/27/16 0720  BP: (!) 159/79   Pulse: 79 81  Resp: 13 17  Temp:      Last Pain:  Vitals:   12/27/16 0631  TempSrc: Oral         Complications: No apparent anesthesia complications

## 2016-12-27 NOTE — Discharge Instructions (Signed)

## 2016-12-27 NOTE — H&P (Signed)
[name] MRN:  [id number] DOB/SEX:  27-Jun-1956/female  CHIEF COMPLAINT:  Painful left Shoulder  HISTORY: Patient is a 61 y.o. female presented with a history of pain in the left shoulder. Onset of symptoms was gradual starting a few years ago with gradually worsening course since that time. Patient has been treated conservatively with over-the-counter NSAIDs and activity modification. Patient currently rates pain in the shoulder at 8 out of 10 with activity. There is pain at night.  PAST MEDICAL HISTORY: Patient Active Problem List   Diagnosis Date Noted  . Gout 06/11/2015  . Poorly controlled T2_DM due to poor compliance 06/11/2015  . Non-compliance with treatment 09/20/2014  . Insulin Requiring T2_DM 09/07/2014  . Vitamin D deficiency 09/07/2014  . Medication management 09/07/2014  . GERD (gastroesophageal reflux disease) 11/14/2013  . Essential hypertension 11/14/2013  . Mixed hyperlipidemia 11/14/2013  . 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 Cancer ([place]) 1977   CERVICAL  . GERD (gastroesophageal reflux disease) 11/14/2013  . Gout   . Mixed hyperlipidemia 11/14/2013   Past Surgical History:  Procedure Laterality Date  . 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 CHOLECYSTECTOMY    . EYE SURGERY     6/3 LASER FOR GLAUCOMA, 9/03 CE/IOL IMPLANTS  . pancreatectomy    . SHOULDER CLOSED REDUCTION Left 02/03/2013   Procedure: CLOSED MANIPULATION SHOULDER;  Surgeon: Vickey Huger, MD;  Location: [facility];  Service: Orthopedics;  Laterality: Left;  CLOSED MANIPULATION LEFT SHOULDER  . SPLENECTOMY    . TONSILLECTOMY  1970     MEDICATIONS:   Prescriptions Prior to Admission  Medication Sig Dispense Refill Last Dose  . acetaminophen (TYLENOL) 325 MG tablet Take 650 mg by mouth every 6 (six) hours as needed.   Past Week at Unknown time  . aspirin EC 81 MG tablet Take 81 mg by mouth at bedtime.    Past Month at Unknown time  . B Complex-C (SUPER B  COMPLEX PO) Take 1 tablet by mouth daily.   Past Month at Unknown time  . Blood Glucose Monitoring Suppl (ONETOUCH VERIO) w/Device KIT 1 kit by Does not apply route 3 (three) times daily. 1 kit 0 12/26/2016 at Unknown time  . Cholecalciferol (VITAMIN D PO) Take 5,000 Units by mouth 2 (two) times daily.   Past Month at Unknown time  . fluticasone (FLONASE) 50 MCG/ACT nasal spray 1 to 2 sprays each nostril 2 x /daily 16 g 2 Past Week at Unknown time  . glucose blood (ONETOUCH VERIO) test strip CHECK BLOOD SUGAR 3 TIMES DAILY-DX-E11.9 AND Z79.4. 300 each 1 12/26/2016 at Unknown time  . insulin NPH (HUMULIN N,NOVOLIN N) 100 UNIT/ML injection Inject into the skin. 20 UNITS A.M.   12/26/2016 at Unknown time  . insulin NPH-regular Human (NOVOLIN 70/30) (70-30) 100 UNIT/ML injection Inject 6 Units into the skin daily with supper.    12/26/2016 at Unknown time  . losartan-hydrochlorothiazide (HYZAAR) 100-25 MG tablet Take 1/2 to 1 tablet daily for BP 30 tablet 2 12/26/2016 at Unknown time  . metFORMIN (GLUCOPHAGE-XR) 500 MG 24 hr tablet TAKE ONE TABLET BY MOUTH THREE TIMES DAILY AS NEEDED FOR DIABETES 90 tablet 3 12/26/2016 at Unknown time  . omeprazole (PRILOSEC) 20 MG capsule Take 20 mg by mouth daily.   12/26/2016 at Unknown time  . traMADol (ULTRAM) 50 MG tablet Take by mouth 2 (two) times daily.   Past Week at Unknown time  . azithromycin (  ZITHROMAX) 250 MG tablet Take 2 tablets (500 mg) on  Day 1,  followed by 1 tablet (250 mg) once daily on Days 2 through 5. 6 each 0   . Lancets (ONETOUCH ULTRASOFT) lancets CHECK BLOOD SUGAR 3 TIMES DAILY-DX-E11.9 AND Z79.4. 300 each 1   . montelukast (SINGULAIR) 10 MG tablet Take 1 tablet (10 mg total) by mouth daily. 30 tablet 2     ALLERGIES:   Allergies  Allergen Reactions  . Penicillins   . Pepcid [Famotidine] Other (See Comments)    HEADACHE  . Prevacid [Lansoprazole] Diarrhea    REVIEW OF SYSTEMS:  A comprehensive review of systems was negative except for:  Musculoskeletal: positive for bone pain and stiff joints   FAMILY HISTORY:   Family History  Problem Relation Age of Onset  . Dementia Mother   . Heart disease Mother   . Hypertension Mother   . COPD Mother   . Heart disease Father   . Diabetes Father   . Glaucoma Father   . Cancer Father     BLADDER  . Depression Sister     SOCIAL HISTORY:   Social History  Substance Use Topics  . Smoking status: Former Smoker    Quit date: 09/23/1989  . Smokeless tobacco: Never Used  . Alcohol use No     EXAMINATION:  Vital signs in last 24 hours: Temp:  [98.4 F (36.9 C)] 98.4 F (36.9 C) (04/06 0631) Pulse Rate:  [79] 79 (04/06 0631) Resp:  [16] 16 (04/06 0631) BP: (147)/(87) 147/87 (04/06 0631) SpO2:  [100 %] 100 % (04/06 0631) Weight:  [59.4 kg (131 lb)] 59.4 kg (131 lb) (04/06 0631)  BP (!) 147/87   Pulse 79   Temp 98.4 F (36.9 C) (Oral)   Resp 16   Ht _0  (1.549 m)   Wt 59.4 kg (131 lb)   SpO2 100%   BMI 24.75 kg/m   General Appearance:    Alert, cooperative, no distress, appears stated age  Head:    Normocephalic, without obvious abnormality, atraumatic  Eyes:    PERRL, conjunctiva/corneas clear, EOM's intact, fundi    benign, both eyes  Ears:    Normal TM's and external ear canals, both ears  Nose:   Nares normal, septum midline, mucosa normal, no drainage    or sinus tenderness  Throat:   Lips, mucosa, and tongue normal; teeth and gums normal  Neck:   Supple, symmetrical, trachea midline, no adenopathy;    thyroid:  no enlargement/tenderness/nodules; no carotid   bruit or JVD  Back:     Symmetric, no curvature, ROM normal, no CVA tenderness  Lungs:     Clear to auscultation bilaterally, respirations unlabored  Chest Wall:    No tenderness or deformity   Heart:    Regular rate and rhythm, S1 and S2 normal, no murmur, rub   or gallop  Breast Exam:    No tenderness, masses, or nipple abnormality  Abdomen:     Soft, non-tender, bowel sounds active all four  quadrants,    no masses, no organomegaly  Genitalia:    Normal female without lesion, discharge or tenderness  Rectal:    Normal tone, no masses or tenderness;   guaiac negative stool  Extremities:   Extremities normal, atraumatic, no cyanosis or edema  Pulses:   2+ and symmetric all extremities  Skin:   Skin color, texture, turgor normal, no rashes or lesions  Lymph nodes:   Cervical, supraclavicular, and axillary  nodes normal  Neurologic:   CNII-XII intact, normal strength, sensation and reflexes    throughout    Musculoskeletal:  ROM decreased, Ligaments intact,  Imaging Review Plain radiographs and MRI demonstrate impingement syndrome of the left shoulder. The bone quality appears to be good for age and reported activity level.  Assessment/Plan: Impingement syndrome left shoulder  The patient history, physical examination and imaging studies are consistent with impingement syndrome of the left shoulder. The patient has failed conservative treatment.  The clearance notes were reviewed.  After discussion with the patient it was felt that left shoulder arthroscopy, subacromial decompression, and distal clavicle resection was indicated. The procedure,  risks, and benefits of shoulder decompression were presented and reviewed. The risks including but not limited to infection, blood clots, vascular injury, stiffness, complications among others were discussed. The patient acknowledged the explanation, agreed to proceed with the plan.  Donia Ast 12/27/2016, 7:00 AM

## 2017-01-07 ENCOUNTER — Other Ambulatory Visit: Payer: Self-pay | Admitting: Internal Medicine

## 2017-01-21 ENCOUNTER — Ambulatory Visit: Payer: Self-pay | Admitting: Internal Medicine

## 2017-01-22 ENCOUNTER — Encounter: Payer: Self-pay | Admitting: Internal Medicine

## 2017-01-22 ENCOUNTER — Ambulatory Visit (INDEPENDENT_AMBULATORY_CARE_PROVIDER_SITE_OTHER): Payer: BC Managed Care – PPO | Admitting: Internal Medicine

## 2017-01-22 VITALS — BP 124/70 | HR 80 | Temp 97.1°F | Resp 16 | Ht 61.0 in | Wt 129.2 lb

## 2017-01-22 DIAGNOSIS — Z79899 Other long term (current) drug therapy: Secondary | ICD-10-CM

## 2017-01-22 DIAGNOSIS — N390 Urinary tract infection, site not specified: Secondary | ICD-10-CM | POA: Diagnosis not present

## 2017-01-22 DIAGNOSIS — Z794 Long term (current) use of insulin: Secondary | ICD-10-CM | POA: Diagnosis not present

## 2017-01-22 DIAGNOSIS — I10 Essential (primary) hypertension: Secondary | ICD-10-CM

## 2017-01-22 DIAGNOSIS — E119 Type 2 diabetes mellitus without complications: Secondary | ICD-10-CM | POA: Diagnosis not present

## 2017-01-22 NOTE — Progress Notes (Signed)
  Subjective:    Patient ID: [name], female    DOB: [birth year], 61 y.o.   MRN: [id number]  HPI   This nice albeit poorly compliant 61 yo MWF with HTN, HLD, T2_DM  returns for Diabetic f/u . Last A1c was 13.7% on 3 April 18, 15.4% 1 year ago and 17.1 % 2 years ago whas completed L shoulder arthroscopic surgery in the interim. She continues her Metformin 3x/meals. She was switched to Novolin 70/30 mix and is taking 15 u qam and 10 u qpm and reports am glucoses are ranging about 120's and pm betw 90-10 mg%. She admits a few episodes of hypoglycemic reactions. With the threat of being reported to the [place] [employer] as a threat driving due to her poor compliance and control she seemingly has actually tried better dietary and med compliance as her weight is down 7# over the last month. She also was treated for an Ecoli UTI w/SXT.  Medication Sig  . acetaminophen  325 MG tablet Take 650 mg by mouth every 6 (six) hours as needed.  Marland Kitchen aspirin EC 81 MG Take 81 mg by mouth at bedtime.   . SUPER B COMPLEX  Take 1 tablet by mouth daily.  Marland Kitchen VITAMIN D Take 5,000 Units by mouth 2 (two) times daily.  Marland Kitchen FLONASE nasal spray 1 to 2 sprays each nostril 2 x /daily  . NOVOLIN 70/30 As directed  . metFORMIN-XR 500 MG  TAKE ONE TAB THREE TIMES DAILY   . montelukast 10 MG tablet Take 1 tablet (10 mg total) by mouth daily.  Marland Kitchen omeprazole  20 MG capsule Take 20 mg by mouth daily.  Marland Kitchen losartan-hctz 100-25 MG tablet Patient not taking due to low BP's  . traMADol (ULTRAM) 50 MG tablet Take by mouth 2 (two) times daily.   Allergies  Allergen Reactions  . Penicillins   . Pepcid [Famotidine] Other (See Comments)    HEADACHE  . Prevacid [Lansoprazole] Diarrhea   Past Medical History:  Diagnosis Date  . Cancer ([place]) 1977   CERVICAL  . GERD (gastroesophageal reflux disease) 11/14/2013  . Gout   . Mixed hyperlipidemia 11/14/2013   Review of Systems  10 point systems review negative except as above.    Objective:   Physical  Exam  BP 124/70   Pulse 80   Temp 97.1 F (36.2 C)   Resp 16   Ht 5\' 1"  (1.549 m)   Wt 129 lb 3.2 oz (58.6 kg)   BMI 24.41 kg/m   HEENT - WNL Neck - supple. Nl Thyroid. Carotids 2+ & No bruits, nodes, JVD Chest - Clear equal BS. Cor - Nl HS. RRR w/o sig m MS- FROM w/o deformities. Muscle power, tone and bulk Nl. Gait Nl. Neuro - Nl w/o focal abnormalities.    Assessment & Plan:   1. Essential hypertension   2. Insulin Requiring T2_DM  - Recc change Nov 70/30 to 8 u qam and 6 u qpm and continue MF 3 x/meals in hopes of tapering off of insulin if she stays on a compliant diet.   - Fructosamine  3. Urinary tract infection, recent  - Urinalysis, Routine w reflex microscopic - Urine culture

## 2017-01-22 NOTE — Patient Instructions (Signed)

## 2017-01-23 ENCOUNTER — Other Ambulatory Visit: Payer: Self-pay | Admitting: *Deleted

## 2017-01-23 LAB — URINALYSIS, ROUTINE W REFLEX MICROSCOPIC
BILIRUBIN URINE: NEGATIVE
Glucose, UA: NEGATIVE
HGB URINE DIPSTICK: NEGATIVE
Ketones, ur: NEGATIVE
Leukocytes, UA: NEGATIVE
Nitrite: NEGATIVE
PROTEIN: NEGATIVE
Specific Gravity, Urine: 1.019 (ref 1.001–1.035)
pH: 5 (ref 5.0–8.0)

## 2017-01-23 MED ORDER — ONETOUCH DELICA LANCETS 33G MISC: 1 refills | Status: AC

## 2017-01-24 ENCOUNTER — Other Ambulatory Visit: Payer: Self-pay | Admitting: Internal Medicine

## 2017-01-24 LAB — URINE CULTURE

## 2017-01-24 LAB — FRUCTOSAMINE: Fructosamine: 394 umol/L — ABNORMAL HIGH (ref 190–270)

## 2017-01-24 MED ORDER — NITROFURANTOIN MONOHYD MACRO 100 MG PO CAPS: ORAL_CAPSULE | ORAL | 0 refills | Status: AC

## 2017-02-26 ENCOUNTER — Ambulatory Visit: Payer: Self-pay | Admitting: Internal Medicine

## 2017-06-02 ENCOUNTER — Encounter (INDEPENDENT_AMBULATORY_CARE_PROVIDER_SITE_OTHER): Payer: Self-pay | Admitting: Ophthalmology

## 2017-06-11 ENCOUNTER — Encounter (INDEPENDENT_AMBULATORY_CARE_PROVIDER_SITE_OTHER): Payer: BC Managed Care – PPO | Admitting: Ophthalmology

## 2017-06-11 DIAGNOSIS — E113313 Type 2 diabetes mellitus with moderate nonproliferative diabetic retinopathy with macular edema, bilateral: Secondary | ICD-10-CM

## 2017-06-11 DIAGNOSIS — H43813 Vitreous degeneration, bilateral: Secondary | ICD-10-CM

## 2017-06-11 DIAGNOSIS — E11311 Type 2 diabetes mellitus with unspecified diabetic retinopathy with macular edema: Secondary | ICD-10-CM | POA: Diagnosis not present

## 2017-06-13 ENCOUNTER — Encounter (INDEPENDENT_AMBULATORY_CARE_PROVIDER_SITE_OTHER): Payer: BC Managed Care – PPO | Admitting: Ophthalmology

## 2017-06-13 DIAGNOSIS — E113391 Type 2 diabetes mellitus with moderate nonproliferative diabetic retinopathy without macular edema, right eye: Secondary | ICD-10-CM | POA: Diagnosis not present

## 2017-06-13 DIAGNOSIS — E11319 Type 2 diabetes mellitus with unspecified diabetic retinopathy without macular edema: Secondary | ICD-10-CM

## 2017-07-07 ENCOUNTER — Encounter (INDEPENDENT_AMBULATORY_CARE_PROVIDER_SITE_OTHER): Payer: BC Managed Care – PPO | Admitting: Ophthalmology

## 2017-07-09 ENCOUNTER — Encounter (INDEPENDENT_AMBULATORY_CARE_PROVIDER_SITE_OTHER): Payer: BC Managed Care – PPO | Admitting: Ophthalmology

## 2017-07-09 DIAGNOSIS — E113313 Type 2 diabetes mellitus with moderate nonproliferative diabetic retinopathy with macular edema, bilateral: Secondary | ICD-10-CM

## 2017-07-09 DIAGNOSIS — H35033 Hypertensive retinopathy, bilateral: Secondary | ICD-10-CM | POA: Diagnosis not present

## 2017-07-09 DIAGNOSIS — E11311 Type 2 diabetes mellitus with unspecified diabetic retinopathy with macular edema: Secondary | ICD-10-CM | POA: Diagnosis not present

## 2017-07-09 DIAGNOSIS — I10 Essential (primary) hypertension: Secondary | ICD-10-CM

## 2017-07-09 DIAGNOSIS — H43813 Vitreous degeneration, bilateral: Secondary | ICD-10-CM

## 2017-07-16 ENCOUNTER — Encounter: Payer: BC Managed Care – PPO | Attending: Internal Medicine | Admitting: *Deleted

## 2017-07-16 DIAGNOSIS — Z794 Long term (current) use of insulin: Secondary | ICD-10-CM

## 2017-07-16 DIAGNOSIS — E119 Type 2 diabetes mellitus without complications: Secondary | ICD-10-CM

## 2017-07-16 DIAGNOSIS — E1165 Type 2 diabetes mellitus with hyperglycemia: Secondary | ICD-10-CM | POA: Insufficient documentation

## 2017-07-16 NOTE — Patient Instructions (Signed)
Plan:  Aim for 2 Carb Choices per meal (30 grams) +/- 1 either way  Aim for 0-1 Carbs per snack if hungry  Include protein in moderation with your meals and snacks Consider reading food labels for Total Carbohydrate of foods Continue with your activity level by walking daily as tolerated Continue checking BG at alternate times per day as directed by MD  Continue taking medication as directed by MD

## 2017-07-16 NOTE — Progress Notes (Signed)
Diabetes Self-Management Education  Visit Type: First/Initial  Appt. Start Time: 0930 Appt. End Time: 1100  07/16/2017  [name], identified by name and date of birth, is a 61 y.o. female with a diagnosis of Diabetes: Type 2. 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 She states history of diabetes since around 2002. She is not sure what kind of diabetes she has. She does exercise almost daily and tries to eat the "right" foods but states no previous education on carb counting. 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 Her sleep time is from 8:30 PM to 3:30 AM with this work schedule. She also asked about insulin pumps and how they work.  ASSESSMENT  Height 5' (1.524 m), weight 124 lb 14.4 oz (56.7 kg). Body mass index is 24.39 kg/m.      Diabetes Self-Management Education - 07/16/17 0947      Visit Information   Visit Type First/Initial     Initial Visit   Diabetes Type Type 2   Are you currently following a meal plan? No   Are you taking your medications as prescribed? Yes   Date Diagnosed 2008     Health Coping   How would you rate your overall health? Fair     Psychosocial Assessment   Patient Belief/Attitude about Diabetes Motivated to manage diabetes   Self-care barriers None   Self-management support Family;Doctor's office   Other persons present Patient   Patient Concerns Glycemic Control;Nutrition/Meal planning   How often do you need to have someone help you when you read instructions, pamphlets, or other written materials from your doctor or pharmacy? 1 - Never   What is the last grade level you completed in school? 12     Complications   Last HgB A1C per patient/outside source 11.4 %   How often do you check your blood sugar? 1-2 times/day   Have you had a dilated eye exam in the past 12 months? Yes   Have you had a dental exam in the  past 12 months? Yes   Are you checking your feet? Yes   How many days per week are you checking your feet? 3     Dietary Intake   Breakfast 4:45 AM: whole grain quick oatmeal, occasionally with raisins, Equal, low fat milk   Snack (morning) 8:30 AM only if BG feels low: Smarties or fresh fruit   Lunch 10:30 AM at home: whole grain bread with chicken salad, cheese. whole grain tostada chips or veggie chips   Snack (afternoon) 3:30 PM: fresh fruit, sunflower seeds   Dinner 5:30 - 7 PM: lean meat, occasionally a starch, vegetables,    Beverage(s) coffee with equal and milk, hot tea, water     Exercise   Exercise Type Light (walking / raking leaves)   How many days per week to you exercise? 30   How many minutes per day do you exercise? 6   Total minutes per week of exercise 180     Patient Education   Previous Diabetes Education No   Disease state  Definition of diabetes, type 1 and 2, and the diagnosis of diabetes   Nutrition management  Role of diet in the treatment of diabetes and the relationship between the three main macronutrients and blood glucose level;Food label reading, portion sizes and measuring food.;Carbohydrate counting     Individualized  Goals (developed by patient)   Nutrition Follow meal plan discussed   Physical Activity Exercise 3-5 times per week   Medications take my medication as prescribed   Monitoring  test blood glucose pre and post meals as discussed     Post-Education Assessment   Patient understands the diabetes disease and treatment process. Demonstrates understanding / competency   Patient understands incorporating nutritional management into lifestyle. Demonstrates understanding / competency   Patient undertands incorporating physical activity into lifestyle. Demonstrates understanding / competency   Patient understands using medications safely. Demonstrates understanding / competency   Patient understands monitoring blood glucose, interpreting and  using results Demonstrates understanding / competency     Outcomes   Expected Outcomes Demonstrated interest in learning. Expect positive outcomes   Future DMSE 4-6 wks   Program Status Not Completed      Individualized Plan for Diabetes Self-Management Training:   Learning Objective:  Patient will have a greater understanding of diabetes self-management. Patient education plan is to attend individual and/or group sessions per assessed needs and concerns.   Plan:   Patient Instructions  Plan:  Aim for 2 Carb Choices per meal (30 grams) +/- 1 either way  Aim for 0-1 Carbs per snack if hungry  Include protein in moderation with your meals and snacks Consider reading food labels for Total Carbohydrate of foods Continue with your activity level by walking daily as tolerated Continue checking BG at alternate times per day as directed by MD  Continue taking medication as directed by MD  We will discuss insulin action and how insulin pumps work at your next visit per your request.  Expected Outcomes:  Demonstrated interest in learning. Expect positive outcomes  Education material provided: Food label handouts, A1C conversion sheet, Meal plan card and Carbohydrate counting sheet  If problems or questions, patient to contact team via:  Phone  Future DSME appointment: 4-6 wks

## 2017-07-25 ENCOUNTER — Encounter (INDEPENDENT_AMBULATORY_CARE_PROVIDER_SITE_OTHER): Payer: BC Managed Care – PPO | Admitting: Ophthalmology

## 2017-07-25 DIAGNOSIS — E113312 Type 2 diabetes mellitus with moderate nonproliferative diabetic retinopathy with macular edema, left eye: Secondary | ICD-10-CM

## 2017-07-25 DIAGNOSIS — E11311 Type 2 diabetes mellitus with unspecified diabetic retinopathy with macular edema: Secondary | ICD-10-CM

## 2017-08-20 ENCOUNTER — Ambulatory Visit: Payer: Self-pay | Admitting: *Deleted

## 2017-08-22 ENCOUNTER — Encounter (INDEPENDENT_AMBULATORY_CARE_PROVIDER_SITE_OTHER): Payer: BC Managed Care – PPO | Admitting: Ophthalmology

## 2017-08-25 ENCOUNTER — Encounter (INDEPENDENT_AMBULATORY_CARE_PROVIDER_SITE_OTHER): Payer: BC Managed Care – PPO | Admitting: Ophthalmology

## 2017-08-25 DIAGNOSIS — E113313 Type 2 diabetes mellitus with moderate nonproliferative diabetic retinopathy with macular edema, bilateral: Secondary | ICD-10-CM | POA: Diagnosis not present

## 2017-08-25 DIAGNOSIS — E11311 Type 2 diabetes mellitus with unspecified diabetic retinopathy with macular edema: Secondary | ICD-10-CM

## 2017-08-25 DIAGNOSIS — H43813 Vitreous degeneration, bilateral: Secondary | ICD-10-CM | POA: Diagnosis not present

## 2017-10-06 ENCOUNTER — Encounter (INDEPENDENT_AMBULATORY_CARE_PROVIDER_SITE_OTHER): Payer: BC Managed Care – PPO | Admitting: Ophthalmology

## 2017-11-05 ENCOUNTER — Encounter (INDEPENDENT_AMBULATORY_CARE_PROVIDER_SITE_OTHER): Payer: BC Managed Care – PPO | Admitting: Ophthalmology

## 2017-11-05 ENCOUNTER — Encounter (INDEPENDENT_AMBULATORY_CARE_PROVIDER_SITE_OTHER): Payer: Self-pay | Admitting: Ophthalmology

## 2017-11-05 DIAGNOSIS — H43813 Vitreous degeneration, bilateral: Secondary | ICD-10-CM | POA: Diagnosis not present

## 2017-11-05 DIAGNOSIS — E113311 Type 2 diabetes mellitus with moderate nonproliferative diabetic retinopathy with macular edema, right eye: Secondary | ICD-10-CM

## 2017-11-05 DIAGNOSIS — E113392 Type 2 diabetes mellitus with moderate nonproliferative diabetic retinopathy without macular edema, left eye: Secondary | ICD-10-CM

## 2017-11-05 DIAGNOSIS — E11311 Type 2 diabetes mellitus with unspecified diabetic retinopathy with macular edema: Secondary | ICD-10-CM | POA: Diagnosis not present

## 2017-12-31 ENCOUNTER — Encounter (INDEPENDENT_AMBULATORY_CARE_PROVIDER_SITE_OTHER): Payer: BC Managed Care – PPO | Admitting: Ophthalmology

## 2017-12-31 DIAGNOSIS — H43813 Vitreous degeneration, bilateral: Secondary | ICD-10-CM | POA: Diagnosis not present

## 2017-12-31 DIAGNOSIS — E11311 Type 2 diabetes mellitus with unspecified diabetic retinopathy with macular edema: Secondary | ICD-10-CM

## 2017-12-31 DIAGNOSIS — E113313 Type 2 diabetes mellitus with moderate nonproliferative diabetic retinopathy with macular edema, bilateral: Secondary | ICD-10-CM

## 2018-01-05 ENCOUNTER — Encounter: Payer: Self-pay | Admitting: Internal Medicine

## 2018-02-06 ENCOUNTER — Encounter: Payer: Self-pay | Admitting: Podiatry

## 2018-02-06 ENCOUNTER — Ambulatory Visit (INDEPENDENT_AMBULATORY_CARE_PROVIDER_SITE_OTHER): Payer: BC Managed Care – PPO

## 2018-02-06 ENCOUNTER — Ambulatory Visit: Payer: BC Managed Care – PPO | Admitting: Podiatry

## 2018-02-06 VITALS — BP 169/83 | HR 70 | Temp 97.2°F | Resp 16

## 2018-02-06 DIAGNOSIS — E1149 Type 2 diabetes mellitus with other diabetic neurological complication: Secondary | ICD-10-CM

## 2018-02-06 DIAGNOSIS — L97529 Non-pressure chronic ulcer of other part of left foot with unspecified severity: Secondary | ICD-10-CM

## 2018-02-06 DIAGNOSIS — B351 Tinea unguium: Secondary | ICD-10-CM | POA: Diagnosis not present

## 2018-02-06 MED ORDER — GABAPENTIN 100 MG PO CAPS: 100.0000 mg | ORAL_CAPSULE | Freq: Every day | ORAL | 0 refills | Status: DC

## 2018-02-09 NOTE — Progress Notes (Signed)
Subjective:   Patient ID: [name], female   DOB: 62 y.o.   MRN: [id number]   HPI 62 year old female presents the office today for multiple concerns.  Her primary concern is that she is here for a wound on her left foot, pointing to plantar submetatarsal phalangeal joint on the first.  This is been ongoing for 1 week.  She states that this seems to insert the peel-away and she developed an area of the wound.  It is gotten somewhat red and swollen.  She has been using Silvadene to the area daily.  She is currently on doxycycline for urinary tract infection.  Denies any drainage or pus coming from the area.  She has secondary concerns of both of her big toenails becoming thick and discolored.  Denies any pain to the nails and denies any redness or drainage or any swelling.  She also has some numbness to her feet.  She has some occasional pain at nighttime.  Her last A1c was 11   Review of Systems  All other systems reviewed and are negative.  Past Medical History:  Diagnosis Date  . Cancer ([place]) 1977   CERVICAL  . GERD (gastroesophageal reflux disease) 11/14/2013  . Gout   . Mixed hyperlipidemia 11/14/2013    Past Surgical History:  Procedure Laterality Date  . 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 CHOLECYSTECTOMY    . EYE SURGERY     6/3 LASER FOR GLAUCOMA, 9/03 CE/IOL IMPLANTS  . pancreatectomy    . SHOULDER CLOSED REDUCTION Left 02/03/2013   Procedure: CLOSED MANIPULATION SHOULDER;  Surgeon: Vickey Huger, MD;  Location: [facility];  Service: Orthopedics;  Laterality: Left;  CLOSED MANIPULATION LEFT SHOULDER  . SPLENECTOMY    . TONSILLECTOMY  1970     Current Outpatient Medications:  .  CINNAMON PO, Take by mouth., Disp: , Rfl:  .  diphenhydrAMINE HCl (BENADRYL ALLERGY PO), Take by mouth., Disp: , Rfl:  .  Triamcinolone Acetonide (NASACORT AQ NA), Place into the nose., Disp: , Rfl:  .  acetaminophen (TYLENOL) 325 MG tablet, Take 650 mg by mouth every 6 (six) hours as  needed., Disp: , Rfl:  .  aspirin EC 81 MG tablet, Take 81 mg by mouth at bedtime. , Disp: , Rfl:  .  Blood Glucose Monitoring Suppl (ONETOUCH VERIO) w/Device KIT, 1 kit by Does not apply route 3 (three) times daily., Disp: 1 kit, Rfl: 0 .  Cholecalciferol (VITAMIN D PO), Take 5,000 Units by mouth 2 (two) times daily., Disp: , Rfl:  .  doxycycline (VIBRAMYCIN) 100 MG capsule, , Disp: , Rfl:  .  gabapentin (NEURONTIN) 100 MG capsule, Take 1 capsule (100 mg total) by mouth at bedtime., Disp: 30 capsule, Rfl: 0 .  glucose blood (ONETOUCH VERIO) test strip, CHECK BLOOD SUGAR 3 TIMES DAILY-DX-E11.9 AND Z79.4., Disp: 300 each, Rfl: 1 .  insulin NPH-regular Human (NOVOLIN 70/30) (70-30) 100 UNIT/ML injection, Inject 10 Units into the skin daily with supper. , Disp: , Rfl:  .  insulin NPH-regular Human (NOVOLIN 70/30) (70-30) 100 UNIT/ML injection, Inject 12 Units into the skin daily with breakfast., Disp: , Rfl:  .  losartan-hydrochlorothiazide (HYZAAR) 100-25 MG tablet, Take 1/2 to 1 tablet daily for BP (Patient not taking: Reported on 01/22/2017), Disp: 30 tablet, Rfl: 2 .  omeprazole (PRILOSEC) 20 MG capsule, Take 20 mg by mouth daily., Disp: , Rfl:  .  ONETOUCH DELICA LANCETS 50P MISC, Check blood sugar 3 times daily-DX-E11.9  and Z79.4., Disp: 300 each, Rfl: 1 .  silver sulfADIAZINE (SILVADENE) 1 % cream, , Disp: , Rfl:   Allergies  Allergen Reactions  . Penicillins   . Pepcid [Famotidine] Other (See Comments)    HEADACHE  . Prevacid [Lansoprazole] Diarrhea       Objective:  Physical Exam  General: AAO x3, NAD  Dermatological: On the plantar aspect of the left foot submetatarsal 1 is what appears to be an old blister that she had peeled off some skin has resulted in a superficial wound measuring approximately 0.5 x 0.3 cm with a granular wound base.  There is no surrounding erythema, ascending cellulitis.  There is no fluctuation or crepitation there is no malodor.  Minimal swelling.  Her nails  appear to be somewhat hypertrophic, dystrophic with yellow discoloration.  There is no pain in the nails today and there is no surrounding redness or drainage or any clinical signs of infection today.  There is no other open lesions or pre-ulcerative lesions.  Vascular: Dorsalis Pedis artery and Posterior Tibial artery pedal pulses are 2/4 bilateral with immedate capillary fill time.  There is no pain with calf compression, swelling, warmth, erythema.   Neruologic: Sensation decreased with Derrel Nip monofilament to the digits and there is decreased vibratory sensation.  Musculoskeletal: No gross boney pedal deformities bilateral. No pain, crepitus, or limitation noted with foot and ankle range of motion bilateral. Muscular strength 5/5 in all groups tested bilateral.  Gait: Unassisted, Nonantalgic.       Assessment:   62 year old female with uncontrolled type 2 diabetes resulting in ulceration left foot, neuropathy, onychomycosis     Plan:  -Treatment options discussed including all alternatives, risks, and complications -Etiology of symptoms were discussed  1.  Left foot ulceration -Clinically does not appear to be infected and appears to be healing.  She is Artie on doxycycline for UTI which should be good for the wound as well.  I want her to continue with Silvadene dressing changes daily.  Offloading at all times.  Surgical shoe was dispensed with offloading pads. -Monitor for any clinical signs or symptoms of infection and directed to call the office immediately should any occur or go to the ER.  2.  Neuropathy -She has been getting pain at nighttime and she started to get morning burning, tingling.  Discussed options for this and she wants to start medication.  We discussed gabapentin.  And started a low dose of 100 mg at nighttime and titrate up based on she can tolerate the medication as well as her symptom relief.  Discussed side effects and let me know.  3.   Onychomycosis -Discussed various treatment options for this.  She is instructed over-the-counter topical treatment.    RTC 10 days or sooner if needed  Trula Slade DPM

## 2018-02-20 ENCOUNTER — Ambulatory Visit: Payer: BC Managed Care – PPO | Admitting: Podiatry

## 2018-02-20 DIAGNOSIS — E1149 Type 2 diabetes mellitus with other diabetic neurological complication: Secondary | ICD-10-CM

## 2018-02-20 DIAGNOSIS — L97529 Non-pressure chronic ulcer of other part of left foot with unspecified severity: Secondary | ICD-10-CM

## 2018-02-20 DIAGNOSIS — T148XXA Other injury of unspecified body region, initial encounter: Secondary | ICD-10-CM

## 2018-02-20 MED ORDER — PREGABALIN 75 MG PO CAPS: 75.0000 mg | ORAL_CAPSULE | Freq: Two times a day (BID) | ORAL | 0 refills | Status: DC

## 2018-02-23 ENCOUNTER — Telehealth: Payer: Self-pay | Admitting: Podiatry

## 2018-02-23 MED ORDER — PREGABALIN 75 MG PO CAPS: 75.0000 mg | ORAL_CAPSULE | Freq: Two times a day (BID) | ORAL | 0 refills | Status: DC

## 2018-02-23 NOTE — Telephone Encounter (Signed)
Patient has called again and is wanting to know if she can get the full prescription so she can receive the discount card. Please call patient back at [phone]

## 2018-02-23 NOTE — Telephone Encounter (Signed)
Dr. Jacqualyn Posey states order #[id number] Lyrica 75mg . Orders changed and faxed to the [facility], and pt was informed.

## 2018-02-23 NOTE — Telephone Encounter (Signed)
Change made and faxed to the [facility] and pt informed in earlier telephone message.

## 2018-02-23 NOTE — Telephone Encounter (Signed)
Pt need a full prescription of Lyrica in order to use the coupon discount. She currently has a prescription for 10 pill. She wanted to know if Dr. Jacqualyn Posey could changed the prescription.

## 2018-02-23 NOTE — Addendum Note (Signed)
Addended by: Harriett Sine D on: 02/23/2018 03:11 PM   Modules accepted: Orders

## 2018-02-24 NOTE — Progress Notes (Signed)
Subjective: 62 year old female presents the office today for follow-up evaluation of a wound to her left foot, submetatarsal 1.  She states the wound is gotten much better she denies any drainage or swelling or any redness.  She also states that she could not take the gabapentin because it upset her stomach.  She was rubbing her foot and she thinks she wrapped her foot while she developed a blister on the left fourth toe.  She said her A1c is down to 9 which was originally 17.  No other concerns. Denies any systemic complaints such as fevers, chills, nausea, vomiting. No acute changes since last appointment, and no other complaints at this time.   Objective: AAO x3, NAD DP/PT pulses palpable bilaterally, CRT less than 3 seconds On the left foot submetatarsal one is a hyperkeratotic lesion after debridement it appears that the wound has healed.  There is no edema, erythema, drainage or pus or any open lesion identified today.  There is a blister however to the lateral left fourth toe and upon entering this only clear blister fluid was removed.  There is no pus.  No surrounding erythema, ascending cellulitis. No edema, erythema, increase in warmth to bilateral lower extremities.  No open lesions or pre-ulcerative lesions.  No pain with calf compression, swelling, warmth, erythema  Assessment: Healing wound left foot with new blister left fourth toe, neuropathy  Plan: -All treatment options discussed with the patient including all alternatives, risks, complications.  -I debrided the hyperkeratotic lesion left foot submetatarsal 1 without any complications or bleeding.  I also punctured the blister to the left fourth toe after cleaning with alcohol to remove the clear fluid.  Left foot overlying skin intact.  Antibiotic ointment and a bandage was applied.  Continue daily dressing changes. Monitor for any clinical signs or symptoms of infection and directed to call the office immediately should any occur  or go to the ER. -She could not tolerate the gabapentin.  Try Lyrica.  A prescription was given for 5 days as well as a coupon for free trial.  Monitoring side effects with medication if medication is helpful we will give her full prescription. -Patient encouraged to call the office with any questions, concerns, change in symptoms.   Return in about 3 weeks (around 03/13/2018).  Trula Slade DPM

## 2018-02-28 ENCOUNTER — Other Ambulatory Visit: Payer: Self-pay | Admitting: Podiatry

## 2018-03-04 ENCOUNTER — Encounter (INDEPENDENT_AMBULATORY_CARE_PROVIDER_SITE_OTHER): Payer: BC Managed Care – PPO | Admitting: Ophthalmology

## 2018-03-04 DIAGNOSIS — I10 Essential (primary) hypertension: Secondary | ICD-10-CM

## 2018-03-04 DIAGNOSIS — E11311 Type 2 diabetes mellitus with unspecified diabetic retinopathy with macular edema: Secondary | ICD-10-CM | POA: Diagnosis not present

## 2018-03-04 DIAGNOSIS — E113311 Type 2 diabetes mellitus with moderate nonproliferative diabetic retinopathy with macular edema, right eye: Secondary | ICD-10-CM | POA: Diagnosis not present

## 2018-03-04 DIAGNOSIS — H35033 Hypertensive retinopathy, bilateral: Secondary | ICD-10-CM

## 2018-03-04 DIAGNOSIS — E113392 Type 2 diabetes mellitus with moderate nonproliferative diabetic retinopathy without macular edema, left eye: Secondary | ICD-10-CM | POA: Diagnosis not present

## 2018-03-04 DIAGNOSIS — H43813 Vitreous degeneration, bilateral: Secondary | ICD-10-CM | POA: Diagnosis not present

## 2018-03-10 ENCOUNTER — Other Ambulatory Visit: Payer: Self-pay | Admitting: Internal Medicine

## 2018-03-10 DIAGNOSIS — Z1231 Encounter for screening mammogram for malignant neoplasm of breast: Secondary | ICD-10-CM

## 2018-03-13 ENCOUNTER — Ambulatory Visit: Payer: BC Managed Care – PPO | Admitting: Podiatry

## 2018-03-16 ENCOUNTER — Other Ambulatory Visit: Payer: Self-pay | Admitting: Internal Medicine

## 2018-03-16 DIAGNOSIS — R590 Localized enlarged lymph nodes: Secondary | ICD-10-CM

## 2018-03-23 ENCOUNTER — Other Ambulatory Visit (HOSPITAL_COMMUNITY)
Admission: RE | Admit: 2018-03-23 | Discharge: 2018-03-23 | Disposition: A | Discharge status: Home / Self Care | Payer: BC Managed Care – PPO | Source: Ambulatory Visit | Attending: Obstetrics and Gynecology | Admitting: Obstetrics and Gynecology

## 2018-03-23 ENCOUNTER — Other Ambulatory Visit: Payer: Self-pay | Admitting: Obstetrics and Gynecology

## 2018-03-23 DIAGNOSIS — Z8541 Personal history of malignant neoplasm of cervix uteri: Secondary | ICD-10-CM | POA: Insufficient documentation

## 2018-03-23 DIAGNOSIS — Z01411 Encounter for gynecological examination (general) (routine) with abnormal findings: Secondary | ICD-10-CM | POA: Diagnosis not present

## 2018-03-24 LAB — CYTOLOGY - PAP
ADEQUACY: ABSENT
DIAGNOSIS: NEGATIVE
HPV (WINDOPATH): NOT DETECTED

## 2018-04-02 ENCOUNTER — Other Ambulatory Visit: Payer: Self-pay | Admitting: Internal Medicine

## 2018-04-02 ENCOUNTER — Ambulatory Visit
Admission: RE | Admit: 2018-04-02 | Discharge: 2018-04-02 | Disposition: A | Discharge status: Home / Self Care | Payer: BC Managed Care – PPO | Source: Ambulatory Visit | Attending: Internal Medicine | Admitting: Internal Medicine

## 2018-04-02 DIAGNOSIS — R590 Localized enlarged lymph nodes: Secondary | ICD-10-CM

## 2018-04-02 MED ORDER — IOPAMIDOL (ISOVUE-300) INJECTION 61%
100.0000 mL | Freq: Once | INTRAVENOUS | Status: AC | PRN
  Administered 2018-04-02: 100 mL via INTRAVENOUS

## 2018-04-03 ENCOUNTER — Ambulatory Visit
Admission: RE | Admit: 2018-04-03 | Discharge: 2018-04-03 | Disposition: A | Discharge status: Home / Self Care | Payer: No Typology Code available for payment source | Source: Ambulatory Visit | Attending: Internal Medicine | Admitting: Internal Medicine

## 2018-04-03 DIAGNOSIS — Z1231 Encounter for screening mammogram for malignant neoplasm of breast: Secondary | ICD-10-CM

## 2018-04-08 ENCOUNTER — Other Ambulatory Visit (HOSPITAL_COMMUNITY): Payer: Self-pay | Admitting: Internal Medicine

## 2018-04-08 DIAGNOSIS — C8595 Non-Hodgkin lymphoma, unspecified, lymph nodes of inguinal region and lower limb: Secondary | ICD-10-CM

## 2018-04-08 DIAGNOSIS — D72820 Lymphocytosis (symptomatic): Secondary | ICD-10-CM

## 2018-04-14 ENCOUNTER — Other Ambulatory Visit: Payer: Self-pay | Admitting: Radiology

## 2018-04-14 ENCOUNTER — Other Ambulatory Visit: Payer: Self-pay | Admitting: Student

## 2018-04-14 ENCOUNTER — Telehealth: Payer: Self-pay | Admitting: *Deleted

## 2018-04-14 MED ORDER — PREGABALIN 75 MG PO CAPS: 75.0000 mg | ORAL_CAPSULE | Freq: Two times a day (BID) | ORAL | 0 refills | Status: DC

## 2018-04-14 NOTE — Telephone Encounter (Signed)
Refill request Lyrica. Dr.Wagoner states refill once and pt needs an appt prior to future refills.

## 2018-04-15 ENCOUNTER — Ambulatory Visit (HOSPITAL_COMMUNITY)
Admission: RE | Admit: 2018-04-15 | Discharge: 2018-04-15 | Disposition: A | Discharge status: Home / Self Care | Payer: BC Managed Care – PPO | Source: Ambulatory Visit | Attending: Internal Medicine | Admitting: Internal Medicine

## 2018-04-15 ENCOUNTER — Other Ambulatory Visit: Payer: Self-pay

## 2018-04-15 ENCOUNTER — Encounter (HOSPITAL_COMMUNITY): Payer: Self-pay

## 2018-04-15 DIAGNOSIS — Z818 Family history of other mental and behavioral disorders: Secondary | ICD-10-CM | POA: Diagnosis not present

## 2018-04-15 DIAGNOSIS — Z803 Family history of malignant neoplasm of breast: Secondary | ICD-10-CM | POA: Diagnosis not present

## 2018-04-15 DIAGNOSIS — Z833 Family history of diabetes mellitus: Secondary | ICD-10-CM | POA: Diagnosis not present

## 2018-04-15 DIAGNOSIS — Z888 Allergy status to other drugs, medicaments and biological substances status: Secondary | ICD-10-CM | POA: Diagnosis not present

## 2018-04-15 DIAGNOSIS — Z79899 Other long term (current) drug therapy: Secondary | ICD-10-CM | POA: Diagnosis not present

## 2018-04-15 DIAGNOSIS — Z880 Allergy status to penicillin: Secondary | ICD-10-CM | POA: Insufficient documentation

## 2018-04-15 DIAGNOSIS — Z7982 Long term (current) use of aspirin: Secondary | ICD-10-CM | POA: Diagnosis not present

## 2018-04-15 DIAGNOSIS — Z825 Family history of asthma and other chronic lower respiratory diseases: Secondary | ICD-10-CM | POA: Diagnosis not present

## 2018-04-15 DIAGNOSIS — Z90710 Acquired absence of both cervix and uterus: Secondary | ICD-10-CM | POA: Insufficient documentation

## 2018-04-15 DIAGNOSIS — Z87891 Personal history of nicotine dependence: Secondary | ICD-10-CM | POA: Diagnosis not present

## 2018-04-15 DIAGNOSIS — Z9081 Acquired absence of spleen: Secondary | ICD-10-CM | POA: Insufficient documentation

## 2018-04-15 DIAGNOSIS — K219 Gastro-esophageal reflux disease without esophagitis: Secondary | ICD-10-CM | POA: Diagnosis not present

## 2018-04-15 DIAGNOSIS — Z8052 Family history of malignant neoplasm of bladder: Secondary | ICD-10-CM | POA: Diagnosis not present

## 2018-04-15 DIAGNOSIS — C8595 Non-Hodgkin lymphoma, unspecified, lymph nodes of inguinal region and lower limb: Secondary | ICD-10-CM

## 2018-04-15 DIAGNOSIS — Z8541 Personal history of malignant neoplasm of cervix uteri: Secondary | ICD-10-CM | POA: Insufficient documentation

## 2018-04-15 DIAGNOSIS — Z9049 Acquired absence of other specified parts of digestive tract: Secondary | ICD-10-CM | POA: Diagnosis not present

## 2018-04-15 DIAGNOSIS — R590 Localized enlarged lymph nodes: Secondary | ICD-10-CM | POA: Diagnosis not present

## 2018-04-15 DIAGNOSIS — E782 Mixed hyperlipidemia: Secondary | ICD-10-CM | POA: Insufficient documentation

## 2018-04-15 DIAGNOSIS — D72820 Lymphocytosis (symptomatic): Secondary | ICD-10-CM

## 2018-04-15 DIAGNOSIS — Z794 Long term (current) use of insulin: Secondary | ICD-10-CM | POA: Diagnosis not present

## 2018-04-15 DIAGNOSIS — Z8249 Family history of ischemic heart disease and other diseases of the circulatory system: Secondary | ICD-10-CM | POA: Diagnosis not present

## 2018-04-15 LAB — CBC
HCT: 41.9 % (ref 36.0–46.0)
Hemoglobin: 12.7 g/dL (ref 12.0–15.0)
MCH: 25.8 pg — ABNORMAL LOW (ref 26.0–34.0)
MCHC: 30.3 g/dL (ref 30.0–36.0)
MCV: 85.2 fL (ref 78.0–100.0)
PLATELETS: 283 10*3/uL (ref 150–400)
RBC: 4.92 MIL/uL (ref 3.87–5.11)
RDW: 14.7 % (ref 11.5–15.5)
WBC: 13.2 10*3/uL — ABNORMAL HIGH (ref 4.0–10.5)

## 2018-04-15 LAB — PROTIME-INR
INR: 1.01
PROTHROMBIN TIME: 13.2 s (ref 11.4–15.2)

## 2018-04-15 MED ORDER — FENTANYL CITRATE (PF) 100 MCG/2ML IJ SOLN
INTRAMUSCULAR | Status: AC
  Filled 2018-04-15: qty 2

## 2018-04-15 MED ORDER — SODIUM CHLORIDE 0.9 % IV SOLN: INTRAVENOUS | Status: DC

## 2018-04-15 MED ORDER — MIDAZOLAM HCL 2 MG/2ML IJ SOLN
INTRAMUSCULAR | Status: AC | PRN
  Administered 2018-04-15: 1 mg via INTRAVENOUS
  Administered 2018-04-15 (×2): 0.5 mg via INTRAVENOUS

## 2018-04-15 MED ORDER — LIDOCAINE-EPINEPHRINE 1 %-1:100000 IJ SOLN
INTRAMUSCULAR | Status: AC
  Filled 2018-04-15: qty 1

## 2018-04-15 MED ORDER — MIDAZOLAM HCL 2 MG/2ML IJ SOLN
INTRAMUSCULAR | Status: AC
  Filled 2018-04-15: qty 2

## 2018-04-15 MED ORDER — FENTANYL CITRATE (PF) 100 MCG/2ML IJ SOLN
INTRAMUSCULAR | Status: AC | PRN
  Administered 2018-04-15: 25 ug via INTRAVENOUS
  Administered 2018-04-15: 50 ug via INTRAVENOUS

## 2018-04-15 NOTE — Discharge Instructions (Signed)

## 2018-04-15 NOTE — H&P (Signed)
Chief Complaint: Patient was seen in consultation today for right inguinal node biopsy at the request of [place] D  Referring Physician(s): Charlotte Hall D  Supervising Physician: Sandi Mariscal  Patient Status: [facility] - Out-pt  History of Present Illness: [name] is a 62 y.o. female   Pt was seen by new PCP few weeks ago Discovered abnormal blood work "high white count" MD had pt return and ordered further tests  CT Abd  IMPRESSION: 1. Interval mild enlargement of right inguinal lymph nodes as detailed above. Some of these nodes maintain a benign fatty hilum. Findings may be reactive in etiology. If these do not clinically resolved, continued increase in size, or there is a clinical concern for malignancy consider further evaluation with focused ultrasound and percutaneous tissue sampling is advised  [place] for lymphoma Pt is now scheduled for right inguinal node biopsy  Pt denies wt loss; pain; N/V Does state she has noticed enlargement in B Inguinal Lymph nodes    Past Medical History:  Diagnosis Date  . 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 GERD (gastroesophageal reflux disease) 11/14/2013  . Gout   . Mixed hyperlipidemia 11/14/2013    Past Surgical History:  Procedure Laterality Date  . 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 CHOLECYSTECTOMY    . EYE SURGERY     6/3 LASER FOR GLAUCOMA, 9/03 CE/IOL IMPLANTS  . pancreatectomy    . SHOULDER CLOSED REDUCTION Left 02/03/2013   Procedure: CLOSED MANIPULATION SHOULDER;  Surgeon: Vickey Huger, MD;  Location: [facility] ORS;  Service: Orthopedics;  Laterality: Left;  CLOSED MANIPULATION LEFT SHOULDER  . SPLENECTOMY    . TONSILLECTOMY  1970    Allergies: Penicillins; Pepcid [famotidine]; and Prevacid [lansoprazole]  Medications: Prior to Admission medications   Medication Sig Start Date End Date Taking? Authorizing Provider  acetaminophen (TYLENOL) 500 MG tablet Take 500 mg by mouth at bedtime.     Yes [provider]  ALPRAZolam Duanne Moron) 1 MG tablet Take 0.5 mg by mouth at bedtime as needed for anxiety.   Yes [provider]  aspirin EC 81 MG tablet Take 81 mg by mouth at bedtime.    Yes [provider]  fexofenadine (ALLEGRA) 180 MG tablet Take 180 mg by mouth daily.   Yes [provider]  insulin NPH-regular Human (NOVOLIN 70/30) (70-30) 100 UNIT/ML injection Inject 10 Units into the skin 2 (two) times daily with a meal.    Yes [provider]  Propylene Glycol (SYSTANE COMPLETE) 0.6 % SOLN Place 1 drop into both eyes 2 (two) times daily as needed (for dry eyes).   Yes [provider]  Blood Glucose Monitoring Suppl (ONETOUCH VERIO) w/Device KIT 1 kit by Does not apply route 3 (three) times daily. Patient not taking: Reported on 04/13/2018 12/24/16   Unk Pinto, MD  gabapentin (NEURONTIN) 100 MG capsule TAKE ONE CAPSULE BY MOUTH AT BEDTIME Patient not taking: Reported on 04/13/2018 03/02/18   Trula Slade, DPM  glucose blood (ONETOUCH VERIO) test strip CHECK BLOOD SUGAR 3 TIMES DAILY-DX-E11.9 AND Z79.4. Patient not taking: Reported on 04/13/2018 12/24/16   Unk Pinto, MD  losartan-hydrochlorothiazide [facility]) 100-25 MG tablet Take 1/2 to 1 tablet daily for BP Patient not taking: Reported on 01/22/2017 12/24/16 02/23/17  Unk Pinto, MD  [facility] DELICA LANCETS 23N MISC Check blood sugar 3 times daily-DX-E11.9 and Z79.4. Patient not taking: Reported on 04/13/2018 01/23/17   Unk Pinto, MD  pregabalin (LYRICA) 75 MG capsule  Take 1 capsule (75 mg total) by mouth 2 (two) times daily. 04/14/18   Trula Slade, DPM     Family History  Problem Relation Age of Onset  . Dementia Mother   . Heart disease Mother   . Hypertension Mother   . COPD Mother   . Breast cancer Mother 38  . Heart disease Father   . Diabetes Father   . Glaucoma Father   . Cancer Father        BLADDER  . Depression Sister     Social History     Socioeconomic History  . Marital status: Divorced    Spouse name: Not on file  . Number of children: Not on file  . Years of education: Not on file  . Highest education level: Not on file  Occupational History  . Not on file  Social Needs  . Financial resource strain: Not on file  . Food insecurity:    Worry: Not on file    Inability: Not on file  . Transportation needs:    Medical: Not on file    Non-medical: Not on file  Tobacco Use  . Smoking status: Former Smoker    Last attempt to quit: 09/23/1989    Years since quitting: 28.5  . Smokeless tobacco: Never Used  Substance and Sexual Activity  . Alcohol use: No    Alcohol/week: 0.0 oz  . Drug use: No    Comment: quit>20years ago  . Sexual activity: Not on file  Lifestyle  . Physical activity:    Days per week: Not on file    Minutes per session: Not on file  . Stress: Not on file  Relationships  . Social connections:    Talks on phone: Not on file    Gets together: Not on file    Attends religious service: Not on file    Active member of club or organization: Not on file    Attends meetings of clubs or organizations: Not on file    Relationship status: Not on file  Other Topics Concern  . Not on file  Social History Narrative  . Not on file    Review of Systems: A 12 point ROS discussed and pertinent positives are indicated in the HPI above.  All other systems are negative.  Review of Systems  Constitutional: Negative for activity change, fatigue and fever.  Respiratory: Negative for cough and shortness of breath.   Cardiovascular: Negative for chest pain.  Gastrointestinal: Negative for abdominal pain.  Musculoskeletal: Negative for back pain.  Neurological: Negative for weakness.  Hematological: Positive for adenopathy.  Psychiatric/Behavioral: Negative for behavioral problems and confusion.    Vital Signs: BP (!) 175/96 (BP Location: Right Arm)   Pulse 80   Temp 98.3 F (36.8 C) (Oral)   Ht 5'  (1.524 m)   Wt 140 lb (63.5 kg)   SpO2 100%   BMI 27.34 kg/m   Physical Exam  Constitutional: She is oriented to person, place, and time.  Cardiovascular: Normal rate and regular rhythm.  Pulmonary/Chest: Effort normal and breath sounds normal.  Abdominal: Soft. Bowel sounds are normal.  Musculoskeletal: Normal range of motion.  Neurological: She is alert and oriented to person, place, and time.  Skin: Skin is warm and dry.  Psychiatric: She has a normal mood and affect. Her behavior is normal. Judgment and thought content normal.  Nursing note and vitals reviewed.   Imaging: Ct Soft Tissue Neck W Contrast  Result Date: 04/02/2018  CLINICAL DATA:  LEFT-sided neck mass. EXAM: CT NECK WITH CONTRAST TECHNIQUE: Multidetector CT imaging of the neck was performed using the standard protocol following the bolus administration of intravenous contrast. CONTRAST:  128m ISOVUE-300 IOPAMIDOL (ISOVUE-300) INJECTION 61% COMPARISON:  CT abdomen pelvis reported separately. FINDINGS: Pharynx and larynx: Normal. No mass or swelling. Salivary glands: No inflammation, mass, or stone. Thyroid: 11 mm partially calcified LEFT lobe thyroid nodule. Lymph nodes: Multiple nonpathologic appearing cervical lymph nodes, none enlarged by short axis measurement, largest measures 7 mm LEFT level I and 6 mm LEFT level II. None abnormal density. Vascular: Negative. Limited intracranial: Negative. Visualized orbits: Negative. Mastoids and visualized paranasal sinuses: Clear. Skeleton: No acute or aggressive process. Upper chest: Negative. Other: None. IMPRESSION: No pathologically enlarged cervical lymph nodes are seen. 11 mm partially calcified LEFT lobe thyroid nodule, recommend sonography for further characterization. Electronically Signed   By: JStaci RighterM.D.   On: 04/02/2018 15:46   Ct Abdomen Pelvis W Contrast  Result Date: 04/03/2018 CLINICAL DATA:  Leukocytosis and groin adenopathy. EXAM: CT ABDOMEN AND PELVIS WITH  CONTRAST TECHNIQUE: Multidetector CT imaging of the abdomen and pelvis was performed using the standard protocol following bolus administration of intravenous contrast. CONTRAST:  1061mISOVUE-300 IOPAMIDOL (ISOVUE-300) INJECTION 61% COMPARISON:  CT AP 05/31/2010 FINDINGS: Lower chest: No acute abnormality. Hepatobiliary: Small low density structure within left lobe of liver is too small to characterize measuring 4 mm. Previous cholecystectomy. No intrahepatic bile duct dilatation. Pancreas: Status post distal pancreatectomy. No pancreatic mass, inflammation or main duct dilatation. Spleen: Status post splenectomy. Several splenules are identified within the left upper quadrant of the abdomen. Adrenals/Urinary Tract: The adrenal glands appear normal. Small bilateral low-attenuation kidney lesions are too small to reliably characterize but likely represent small cysts. These measure up to 7 mm. No mass or hydronephrosis identified bilaterally. Urinary bladder appears normal. Stomach/Bowel: The stomach is normal. The small bowel loops have a normal course and caliber. The appendix appears diffusely thickened measuring up to 10 mm. No surrounding inflammation or free fluid. Normal appearance of the colon. Vascular/Lymphatic: Normal appearance of the abdominal aorta. No enlarged lymph nodes within the abdomen. No iliac adenopathy identified. Interval enlargement of right inguinal lymph nodes some of which maintain a benign fatty hilum. Index node from the coronal images measures 2.8 x 1.9 cm, image 23/5. Previously 2.5 x 1.2 cm. Previously this measured 11 mm. 1.6 x 1.1 cm, image 23/5. Previously 1.3 x 0.9 cm. Reproductive: Status post hysterectomy. No adnexal masses. Other: No abdominal wall hernia or abnormality. No abdominopelvic ascites. Musculoskeletal: No acute or significant osseous findings. IMPRESSION: 1. Interval mild enlargement of right inguinal lymph nodes as detailed above. Some of these nodes maintain a  benign fatty hilum. Findings may be reactive in etiology. If these do not clinically resolved, continued increase in size, or there is a clinical concern for malignancy consider further evaluation with focused ultrasound and percutaneous tissue sampling is advised. 2. No acute findings noted within the abdomen or pelvis. 3. Status post splenectomy and distal pancreatectomy. Electronically Signed   By: TaKerby Moors.D.   On: 04/03/2018 08:49   Mm 3d Screen Breast Bilateral  Result Date: 04/03/2018 CLINICAL DATA:  Screening. EXAM: DIGITAL SCREENING BILATERAL MAMMOGRAM WITH TOMO AND CAD COMPARISON:  Previous exam(s). ACR Breast Density Category b: There are scattered areas of fibroglandular density. FINDINGS: There are no findings suspicious for malignancy. Images were processed with CAD. IMPRESSION: No mammographic evidence of malignancy. A result letter  of this screening mammogram will be mailed directly to the patient. RECOMMENDATION: Screening mammogram in one year. (Code:SM-B-01Y) BI-RADS CATEGORY  1: Negative. Electronically Signed   By: Evangeline Dakin M.D.   On: 04/03/2018 14:11    Labs:  CBC: No results for input(s): WBC, HGB, HCT, PLT in the last 8760 hours.  COAGS: No results for input(s): INR, APTT in the last 8760 hours.  BMP: No results for input(s): NA, K, CL, CO2, GLUCOSE, BUN, CALCIUM, CREATININE, GFRNONAA, GFRAA in the last 8760 hours.  Invalid input(s): CMP  LIVER FUNCTION TESTS: No results for input(s): BILITOT, AST, ALT, ALKPHOS, PROT, ALBUMIN in the last 8760 hours.  TUMOR MARKERS: No results for input(s): AFPTM, CEA, CA199, CHROMGRNA in the last 8760 hours.  Assessment and Plan:  Bilateral enlarging inguinal lymph nodes Scheduled for right inguinal LAN bx Risks and benefits discussed with the patient including, but not limited to bleeding, infection, damage to adjacent structures or low yield requiring additional tests.  All of the patient's questions were  answered, patient is agreeable to proceed. Consent signed and in chart.   Thank you for this interesting consult.  I greatly enjoyed meeting SISSY GOETZKE and look forward to participating in their care.  A copy of this report was sent to the requesting provider on this date.  Electronically Signed: Lavonia Drafts, PA-C 04/15/2018, 11:53 AM   I spent a total of  30 Minutes   in face to face in clinical consultation, greater than 50% of which was counseling/coordinating care for right inguinal LAN bx

## 2018-04-15 NOTE — Procedures (Signed)
Pre Procedure Dx: Right inguinal lymphadenopathy Post Procedural Dx: Same  Technically successful US guided biopsy of right inguinal LN.  EBL: None  No immediate complications.   Ronny Bacon, MD Pager #: [phone]

## 2018-04-16 ENCOUNTER — Telehealth: Payer: Self-pay | Admitting: Podiatry

## 2018-04-16 MED ORDER — PREGABALIN 75 MG PO CAPS: 75.0000 mg | ORAL_CAPSULE | Freq: Two times a day (BID) | ORAL | 0 refills | Status: DC

## 2018-04-16 NOTE — Telephone Encounter (Signed)
Faxed Lyrica rx to [id number].

## 2018-04-16 NOTE — Telephone Encounter (Signed)
Left message informing pt the Lyrica had been ordered on the 04/14/2018, but unfortunately needed to be faxed and I would fax today, I apologized for the delay and also informed pt she would need an appt prior to future refills.

## 2018-04-16 NOTE — Telephone Encounter (Signed)
Pt states rx was sent to the wrong pharmacy. I refaxed to [facility].

## 2018-04-16 NOTE — Telephone Encounter (Signed)
Patient wants a refill on   pregabalin (LYRICA) 75 MG capsule  At [facility] on [address] in [place]. Please call patient back at [phone].

## 2018-04-16 NOTE — Addendum Note (Signed)
Addended by: Harriett Sine D on: 04/16/2018 09:56 AM   Modules accepted: Orders

## 2018-04-20 ENCOUNTER — Other Ambulatory Visit: Payer: Self-pay | Admitting: Internal Medicine

## 2018-04-20 DIAGNOSIS — E041 Nontoxic single thyroid nodule: Secondary | ICD-10-CM

## 2018-04-24 ENCOUNTER — Encounter: Payer: Self-pay | Admitting: Hematology

## 2018-04-24 ENCOUNTER — Telehealth: Payer: Self-pay | Admitting: Hematology

## 2018-04-24 NOTE — Telephone Encounter (Signed)
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 Pt has been scheduled to see Dr. Irene Limbo on 8/12 at 11am. Pt aware to arrive 30 minutes early. Letter mailed.

## 2018-04-27 ENCOUNTER — Ambulatory Visit
Admission: RE | Admit: 2018-04-27 | Discharge: 2018-04-27 | Disposition: A | Discharge status: Home / Self Care | Payer: BC Managed Care – PPO | Source: Ambulatory Visit | Attending: Internal Medicine | Admitting: Internal Medicine

## 2018-04-27 DIAGNOSIS — E041 Nontoxic single thyroid nodule: Secondary | ICD-10-CM

## 2018-05-01 NOTE — Progress Notes (Signed)
HEMATOLOGY/ONCOLOGY CONSULTATION NOTE  Date of Service: 05/04/2018  Patient Care Team: Patient, No Pcp Per as PCP - General (General Practice)  CHIEF COMPLAINTS/PURPOSE OF CONSULTATION:  Lymphocytosis  HISTORY OF PRESENTING ILLNESS:   [name] is a wonderful 62 y.o. female who has been referred to [place] by Dr. Emi Belfast for evaluation and management of Lymphocytosis and Lymphadenopathy. The pt reports that she is doing well overall.   The pt reports that she recently presented to her PCP Dr.Schoenhoff for bilateral inguinal masses that she reports first appeared a year ago. She notes that these masses have gotten bigger in the last six months, and they occasionally hurt while pushing or pulling. She has thus far been evaluated with CT Neck, Abdomen and Pelvis, as well as an inguinal biopsy that was benign, as noted below. She was further evaluated with a Thyroid US for a CT-observed nodule, and is awaiting a biopsy of this nodule.   The pt notes that she has had relatively less energy recently. She denies recent fevers and night sweats but notes that she has had more chills than she used to in the past 18 months. She adds that she has lost about 20 pounds in the last year, accompanied by dietary and lifestyle changes. Her appetite has been a little weaker and she has seen a nutritionist recently. She has had a recent mammogram which was unremarkable.   The pt has several cats at home but denies being scratched recently.   The pt had cervical biopsy in 1977 while pregnant, she then had a partial hysterectomy after delivery, and did not have chemotherapy or radiation therapy. She has not had concerns since then. About 10-15 years later, the pt developed ovarian cysts and subsequently had her ovaries removed as well.   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 She dealt with GI symptoms for two years before she had  exploratory surgery.  Of note prior to the patient's visit today, pt has had CT N/A/P completed on 04/02/18 with results revealing No pathologically enlarged cervical lymph nodes are seen. 11 mm partially calcified LEFT lobe thyroid nodule. Interval mild enlargement of right inguinal lymph nodes as detailed above. Some of these nodes maintain a benign fatty hilum. Findings may be reactive in etiology. No acute findings noted within the abdomen or pelvis. Status post splenectomy and distal pancreatectomy.   Most recent lab results (04/30/18) of CBC is as follows: all values are WNL except for WBC at 11.1k, MCH at 26.0, MCHC at 31.6, MPV at 11.1, Lymph abs at 4.9k.  On review of systems, pt reports expected weight loss, occasional chills, slow-growing b/l inguinal masses, and denies abdominal pains, vaginal boils, coughs, colds, flu-like symptoms, skin rashes, and any other symptoms.   On PMHx the pt reports Cervical cancer in 1977. On Family Hx the pt reports uncle with leukemia, matneral grandmother and mother with breast cancer, paternal grandmother with ovarian cancer, uterine cancer, dad with MI, maternal aunt with cancer.    MEDICAL HISTORY:  Past Medical History:  Diagnosis Date  . Cancer ([place]) 1977   CERVICAL  . GERD (gastroesophageal reflux disease) 11/14/2013  . Gout   . Mixed hyperlipidemia 11/14/2013    SURGICAL HISTORY: Past Surgical History:  Procedure Laterality Date  . 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 CHOLECYSTECTOMY    . EYE SURGERY     6/3 LASER FOR GLAUCOMA, 9/03 CE/IOL  IMPLANTS  . pancreatectomy    . SHOULDER CLOSED REDUCTION Left 02/03/2013   Procedure: CLOSED MANIPULATION SHOULDER;  Surgeon: Vickey Huger, MD;  Location: [facility];  Service: Orthopedics;  Laterality: Left;  CLOSED MANIPULATION LEFT SHOULDER  . SPLENECTOMY    . TONSILLECTOMY  1970    SOCIAL HISTORY: Social History   Socioeconomic History  . Marital status: Divorced    Spouse name: Not on  file  . Number of children: Not on file  . Years of education: Not on file  . Highest education level: Not on file  Occupational History  . Not on file  Social Needs  . Financial resource strain: Not on file  . Food insecurity:    Worry: Not on file    Inability: Not on file  . Transportation needs:    Medical: Not on file    Non-medical: Not on file  Tobacco Use  . Smoking status: Former Smoker    Last attempt to quit: 09/23/1989    Years since quitting: 28.6  . Smokeless tobacco: Never Used  Substance and Sexual Activity  . Alcohol use: No    Alcohol/week: 0.0 standard drinks  . Drug use: No    Comment: quit>20years ago  . Sexual activity: Not on file  Lifestyle  . Physical activity:    Days per week: Not on file    Minutes per session: Not on file  . Stress: Not on file  Relationships  . Social connections:    Talks on phone: Not on file    Gets together: Not on file    Attends religious service: Not on file    Active member of club or organization: Not on file    Attends meetings of clubs or organizations: Not on file    Relationship status: Not on file  . Intimate partner violence:    Fear of current or ex partner: Not on file    Emotionally abused: Not on file    Physically abused: Not on file    Forced sexual activity: Not on file  Other Topics Concern  . Not on file  Social History Narrative  . Not on file    FAMILY HISTORY: Family History  Problem Relation Age of Onset  . Dementia Mother   . Heart disease Mother   . Hypertension Mother   . COPD Mother   . Breast cancer Mother 65  . Heart disease Father   . Diabetes Father   . Glaucoma Father   . Cancer Father        BLADDER  . Depression Sister     ALLERGIES:  is allergic to penicillins; pepcid [famotidine]; and prevacid [lansoprazole].  MEDICATIONS:  Current Outpatient Medications  Medication Sig Dispense Refill  . acetaminophen (TYLENOL) 500 MG tablet Take 500 mg by mouth at bedtime.       . ALPRAZolam (XANAX) 1 MG tablet Take 0.5 mg by mouth at bedtime as needed for anxiety.    Marland Kitchen aspirin EC 81 MG tablet Take 81 mg by mouth at bedtime.     . Blood Glucose Monitoring Suppl (ONETOUCH VERIO) w/Device KIT 1 kit by Does not apply route 3 (three) times daily. (Patient not taking: Reported on 04/13/2018) 1 kit 0  . fexofenadine (ALLEGRA) 180 MG tablet Take 180 mg by mouth daily.    Marland Kitchen gabapentin (NEURONTIN) 100 MG capsule TAKE ONE CAPSULE BY MOUTH AT BEDTIME (Patient not taking: Reported on 04/13/2018) 90 capsule 1  . glucose blood (ONETOUCH VERIO)  test strip CHECK BLOOD SUGAR 3 TIMES DAILY-DX-E11.9 AND Z79.4. (Patient not taking: Reported on 04/13/2018) 300 each 1  . insulin NPH-regular Human (NOVOLIN 70/30) (70-30) 100 UNIT/ML injection Inject 10 Units into the skin 2 (two) times daily with a meal.     . losartan-hydrochlorothiazide (HYZAAR) 100-25 MG tablet Take 1/2 to 1 tablet daily for BP (Patient not taking: Reported on 01/22/2017) 30 tablet 2  . ONETOUCH DELICA LANCETS 43K MISC Check blood sugar 3 times daily-DX-E11.9 and Z79.4. (Patient not taking: Reported on 04/13/2018) 300 each 1  . pregabalin (LYRICA) 75 MG capsule Take 1 capsule (75 mg total) by mouth 2 (two) times daily. 60 capsule 0  . Propylene Glycol (SYSTANE COMPLETE) 0.6 % SOLN Place 1 drop into both eyes 2 (two) times daily as needed (for dry eyes).     No current facility-administered medications for this visit.     REVIEW OF SYSTEMS:    10 Point review of Systems was done is negative except as noted above.  PHYSICAL EXAMINATION: ECOG PERFORMANCE STATUS: 1 - Symptomatic but completely ambulatory  . Vitals:   05/04/18 1053  BP: (!) 174/77  Pulse: 77  Resp: 20  Temp: 99.1 F (37.3 C)  SpO2: 98%   Filed Weights   05/04/18 1053  Weight: 135 lb 3.2 oz (61.3 kg)   .Body mass index is 26.4 kg/m.  GENERAL:alert, in no acute distress and comfortable SKIN: no acute rashes, no significant lesions EYES:  conjunctiva are pink and non-injected, sclera anicteric OROPHARYNX: MMM, no exudates, no oropharyngeal erythema or ulceration NECK: supple, no JVD LYMPH:  Palpable inguinal lymph nodes about 2cm that are tender to palpation, no palpable lymphadenopathy in the cervical or axillary regions LUNGS: clear to auscultation b/l with normal respiratory effort HEART: regular rate & rhythm ABDOMEN:  normoactive bowel sounds , non tender, not distended. No palpable hepato splenomegaly. Extremity: no pedal edema PSYCH: alert & oriented x 3 with fluent speech NEURO: no focal motor/sensory deficits  LABORATORY DATA:  I have reviewed the data as listed  . CBC Latest Ref Rng & Units 05/04/2018 04/15/2018 12/24/2016  WBC 3.9 - 10.3 K/uL 8.5 13.2(H) 8.8  Hemoglobin 11.6 - 15.9 g/dL 12.8 12.7 12.1  Hematocrit 34.8 - 46.6 % 41.4 41.9 40.5  Platelets 145 - 400 K/uL 263 283 430(H)    . CMP Latest Ref Rng & Units 05/04/2018 12/24/2016 05/19/2015  Glucose 70 - 99 mg/dL 219(H) 48(L) 102(H)  BUN 8 - 23 mg/dL _0 Creatinine 0.44 - 1.00 mg/dL 0.93 0.59 0.59  Sodium 135 - 145 mmol/L 140 145 142  Potassium 3.5 - 5.1 mmol/L 4.1 5.3 4.4  Chloride 98 - 111 mmol/L 102 102 100  CO2 22 - 32 mmol/L 28 32(H) 31  Calcium 8.9 - 10.3 mg/dL 9.6 9.9 9.5  Total Protein 6.5 - 8.1 g/dL 7.8 6.8 6.3  Total Bilirubin 0.3 - 1.2 mg/dL 0.4 0.3 0.4  Alkaline Phos 38 - 126 U/L 118 157(H) 103  AST 15 - 41 U/L _1 ALT 0 - 44 U/L _2 Component     Latest Ref Rng & Units 05/04/2018  LDH     98 - 192 U/L 226 (H)  Sed Rate     0 - 22 mm/hr 5    04/30/18 CBC w/diff:    04/15/18 Inguinal LN Needle/Core Biopsy:   RADIOGRAPHIC STUDIES: I have personally reviewed the radiological images as listed and agreed  with the findings in the report. [place] Core Biopsy (lymph Nodes)  Result Date: 04/15/2018 INDICATION: No known primary now with leukocytosis and borderline enlarged right inguinal lymph nodes. Please perform  ultrasound-guided right inguinal lymph node biopsy for tissue diagnostic purposes. EXAM: ULTRASOUND-GUIDED BIOPSY OF RIGHT INGUINAL LYMPH NODE COMPARISON:  CT the abdomen and pelvis - 04/03/2018 MEDICATIONS: None ANESTHESIA/SEDATION: Moderate (conscious) sedation was employed during this procedure. A total of Versed 2 mg and Fentanyl 75 mcg was administered intravenously. Moderate Sedation Time: 13 minutes. The patient's level of consciousness and vital signs were monitored continuously by radiology nursing throughout the procedure under my direct supervision. COMPLICATIONS: None immediate. TECHNIQUE: Informed written consent was obtained from the patient after a discussion of the risks, benefits and alternatives to treatment. Questions regarding the procedure were encouraged and answered. Initial ultrasound scanning demonstrated several prominent though non pathologically enlarged right inguinal lymph nodes. An approximately 1.3 x 0.7 cm right inguinal lymph node, correlating with the lymph node seen on preceding abdominal CT image 75, series 2, was targeted for biopsy given location and sonographic window. An ultrasound image was saved for documentation purposes. The procedure was planned. A timeout was performed prior to the initiation of the procedure. The operative was prepped and draped in the usual sterile fashion, and a sterile drape was applied covering the operative field. A timeout was performed prior to the initiation of the procedure. Local anesthesia was provided with 1% lidocaine with epinephrine. Under direct ultrasound guidance, an 18 gauge core needle device was utilized to obtain to obtain 6 core needle biopsies of the dominant right inguinal lymph node. The samples were placed in saline and submitted to pathology. The needle was removed and hemostasis was achieved with manual compression. Post procedure scan was negative for significant hematoma. A dressing was placed. The patient tolerated the  procedure well without immediate postprocedural complication. IMPRESSION: Technically successful ultrasound guided biopsy of dominant right inguinal lymph node. Electronically Signed   By: Sandi Mariscal M.D.   On: 04/15/2018 14:01   US Thyroid  Result Date: 04/27/2018 CLINICAL DATA:  Incidental on CT.  Left thyroid nodule EXAM: THYROID ULTRASOUND TECHNIQUE: Ultrasound examination of the thyroid gland and adjacent soft tissues was performed. COMPARISON:  None. FINDINGS: Parenchymal Echotexture: Mildly heterogenous Isthmus: 0.3 cm Right lobe: 4.6 x 1.6 x 1.8 cm Left lobe: 4.3 x 1.7 x 1.8 cm _________________________________________________________ Estimated total number of nodules >/= 1 cm: 1 Number of spongiform nodules >/=  2 cm not described below (TR1): 0 Number of mixed cystic and solid nodules >/= 1.5 cm not described below (TR2): 0 _________________________________________________________ Nodule # 3: Location: Left; Mid Maximum size: 1.6 cm; Other 2 dimensions: 1.3 x 1.3 cm Composition: solid/almost completely solid (2) Echogenicity: isoechoic (1) Shape: not taller-than-wide (0) Margins: smooth (0) Echogenic foci: punctate echogenic foci (3) ACR TI-RADS total points: 6. ACR TI-RADS risk category: TR4 (4-6 points). ACR TI-RADS recommendations: **Given size (>/= 1.5 cm) and appearance, fine needle aspiration of this moderately suspicious nodule should be considered based on TI-RADS criteria. _________________________________________________________ Several other nodules measure 0.9 cm or less and do not meet criteria for biopsy nor follow-up. IMPRESSION: Left nodule 3 meets criteria for fine needle aspiration biopsy and corresponds to the abnormality noted on CT. The above is in keeping with the ACR TI-RADS recommendations - J Am Coll Radiol 2017;14:587-595. Electronically Signed   By: Marybelle Killings M.D.   On: 04/27/2018 15:39    ASSESSMENT & PLAN:  62 y.o. female  with  1. Lymphocytosis -Discussed  patient's most recent labs from 04/30/18, no anemia with HGB at 12.5, no thrombocytosis with PLT at 245k, some lymphocytosis with Lymphs abs at 4.9k -Pt's lymphocytosis have been stable over the last three years and her occasional thrombocytosis is in the context of a splenectomy ? Related to splenectomy PLAN -Reviewed and discussed the 04/02/18 CT Neck which revealed No pathologically enlarged cervical lymph nodes are seen. 11 mm partially calcified LEFT lobe thyroid nodule, recommend sonography for further characterization.  -Discussed and reviewed the 04/02/18 CT A/P which revealed Interval mild enlargement of right inguinal lymph nodes as detailed above. Some of these nodes maintain a benign fatty hilum. Findings may be reactive in etiology. No acute findings noted within the abdomen or pelvis. Status post splenectomy and distal pancreatectomy -Discussed the 04/15/18 Right Inguinal needle/core biopsy which revealed benign lymph nodal tissue without evidence of malignancy -Will order flow cytometry today -- reviewed - no evidence of lymphoproliferative process -Will order blood tests today -Recommended that the pt keep up to date with her post-splenectomy vaccinations with PCP -Pt's inguinal lymph nodes are palpable, tender and are reported to have grown in size prior to her presentation -Will prescribe a five day course of empiric Doxycycline and recommend also using probiotics -Will refer pt to surgeon for consideration of surgical biopsy  -Will see the pt back in 3 weeks   Labs today Referral to general surgery/[place] [facility] for rt inguinal LN biopsy (Dr Kae Heller or first available) RTC with Dr Irene Limbo in 3 weeks   All of the patients questions were answered with apparent satisfaction. The patient knows to call the clinic with any problems, questions or concerns.  The total time spent in the appt was 70 minutes and more than 50% was on counseling and direct patient cares.     Sullivan Lone MD MS AAHIVMS [facility] Hematology/Oncology Physician [facility]  (Office):       [phone] (Work cell):  [phone] (Fax):           [phone]  05/04/2018 12:06 PM  I, Baldwin [place], am acting as a scribe for Dr. Irene Limbo  .I have reviewed the above documentation for accuracy and completeness, and I agree with the above. Brunetta Genera MD

## 2018-05-04 ENCOUNTER — Inpatient Hospital Stay: Payer: BC Managed Care – PPO | Attending: Hematology | Admitting: Hematology

## 2018-05-04 ENCOUNTER — Telehealth: Payer: Self-pay

## 2018-05-04 ENCOUNTER — Inpatient Hospital Stay: Payer: BC Managed Care – PPO

## 2018-05-04 VITALS — BP 174/77 | HR 77 | Temp 99.1°F | Resp 20 | Ht 60.0 in | Wt 135.2 lb

## 2018-05-04 DIAGNOSIS — Z9081 Acquired absence of spleen: Secondary | ICD-10-CM | POA: Insufficient documentation

## 2018-05-04 DIAGNOSIS — Z809 Family history of malignant neoplasm, unspecified: Secondary | ICD-10-CM | POA: Insufficient documentation

## 2018-05-04 DIAGNOSIS — Z806 Family history of leukemia: Secondary | ICD-10-CM | POA: Diagnosis not present

## 2018-05-04 DIAGNOSIS — Z803 Family history of malignant neoplasm of breast: Secondary | ICD-10-CM

## 2018-05-04 DIAGNOSIS — Z8041 Family history of malignant neoplasm of ovary: Secondary | ICD-10-CM

## 2018-05-04 DIAGNOSIS — E041 Nontoxic single thyroid nodule: Secondary | ICD-10-CM | POA: Diagnosis not present

## 2018-05-04 DIAGNOSIS — Z87891 Personal history of nicotine dependence: Secondary | ICD-10-CM | POA: Diagnosis not present

## 2018-05-04 DIAGNOSIS — Z79899 Other long term (current) drug therapy: Secondary | ICD-10-CM | POA: Insufficient documentation

## 2018-05-04 DIAGNOSIS — R590 Localized enlarged lymph nodes: Secondary | ICD-10-CM | POA: Insufficient documentation

## 2018-05-04 DIAGNOSIS — Z8541 Personal history of malignant neoplasm of cervix uteri: Secondary | ICD-10-CM | POA: Diagnosis not present

## 2018-05-04 DIAGNOSIS — D72820 Lymphocytosis (symptomatic): Secondary | ICD-10-CM | POA: Diagnosis present

## 2018-05-04 DIAGNOSIS — Z808 Family history of malignant neoplasm of other organs or systems: Secondary | ICD-10-CM

## 2018-05-04 LAB — CBC WITH DIFFERENTIAL/PLATELET
BASOS ABS: 0.2 10*3/uL — AB (ref 0.0–0.1)
Basophils Relative: 2 %
EOS ABS: 0.2 10*3/uL (ref 0.0–0.5)
EOS PCT: 2 %
HCT: 41.4 % (ref 34.8–46.6)
Hemoglobin: 12.8 g/dL (ref 11.6–15.9)
Lymphocytes Relative: 42 %
Lymphs Abs: 3.6 10*3/uL — ABNORMAL HIGH (ref 0.9–3.3)
MCH: 25.8 pg (ref 25.1–34.0)
MCHC: 30.9 g/dL — ABNORMAL LOW (ref 31.5–36.0)
MCV: 83.5 fL (ref 79.5–101.0)
Monocytes Absolute: 0.5 10*3/uL (ref 0.1–0.9)
Monocytes Relative: 5 %
Neutro Abs: 4.2 10*3/uL (ref 1.5–6.5)
Neutrophils Relative %: 49 %
PLATELETS: 263 10*3/uL (ref 145–400)
RBC: 4.96 MIL/uL (ref 3.70–5.45)
RDW: 14.8 % — ABNORMAL HIGH (ref 11.2–14.5)
WBC: 8.5 10*3/uL (ref 3.9–10.3)

## 2018-05-04 LAB — CMP (CANCER CENTER ONLY)
ALBUMIN: 4.1 g/dL (ref 3.5–5.0)
ALT: 18 U/L (ref 0–44)
AST: 23 U/L (ref 15–41)
Alkaline Phosphatase: 118 U/L (ref 38–126)
Anion gap: 10 (ref 5–15)
BUN: 16 mg/dL (ref 8–23)
CO2: 28 mmol/L (ref 22–32)
CREATININE: 0.93 mg/dL (ref 0.44–1.00)
Calcium: 9.6 mg/dL (ref 8.9–10.3)
Chloride: 102 mmol/L (ref 98–111)
GFR, Est AFR Am: >60 mL/min (ref >60)
GFR, Estimated: >60 mL/min (ref >60)
GLUCOSE: 219 mg/dL — AB (ref 70–99)
Potassium: 4.1 mmol/L (ref 3.5–5.1)
SODIUM: 140 mmol/L (ref 135–145)
Total Bilirubin: 0.4 mg/dL (ref 0.3–1.2)
Total Protein: 7.8 g/dL (ref 6.5–8.1)

## 2018-05-04 LAB — SEDIMENTATION RATE: Sed Rate: 5 mm/hr (ref 0–22)

## 2018-05-04 LAB — LACTATE DEHYDROGENASE: LDH: 226 U/L — ABNORMAL HIGH (ref 98–192)

## 2018-05-04 MED ORDER — DOXYCYCLINE HYCLATE 100 MG PO TABS: 100.0000 mg | ORAL_TABLET | Freq: Two times a day (BID) | ORAL | 0 refills | Status: DC

## 2018-05-04 NOTE — Telephone Encounter (Signed)
Printed avs and calender of upcoming appointment. Per 8/12 los 

## 2018-05-04 NOTE — Patient Instructions (Signed)
Recommend that you take Probiotics OTC, like lactobacillus  Recommend that you eat cultured yogurt

## 2018-05-05 ENCOUNTER — Other Ambulatory Visit: Payer: Self-pay | Admitting: Internal Medicine

## 2018-05-05 DIAGNOSIS — E041 Nontoxic single thyroid nodule: Secondary | ICD-10-CM

## 2018-05-07 ENCOUNTER — Ambulatory Visit
Admission: RE | Admit: 2018-05-07 | Discharge: 2018-05-07 | Disposition: A | Discharge status: Home / Self Care | Payer: BC Managed Care – PPO | Source: Ambulatory Visit | Attending: Internal Medicine | Admitting: Internal Medicine

## 2018-05-07 ENCOUNTER — Other Ambulatory Visit (HOSPITAL_COMMUNITY)
Admission: RE | Admit: 2018-05-07 | Discharge: 2018-05-07 | Disposition: A | Discharge status: Home / Self Care | Payer: BC Managed Care – PPO | Source: Ambulatory Visit | Attending: Physician Assistant | Admitting: Physician Assistant

## 2018-05-07 DIAGNOSIS — E041 Nontoxic single thyroid nodule: Secondary | ICD-10-CM | POA: Diagnosis not present

## 2018-05-07 LAB — FLOW CYTOMETRY

## 2018-05-07 NOTE — Procedures (Signed)
PROCEDURE SUMMARY:  Using direct ultrasound guidance, 4 passes were made using 25 g needles into the nodule within the left lobe of the thyroid.   Ultrasound was used to confirm needle placements on all occasions.   Specimens were sent to Pathology for analysis.  See procedure note under Imaging tab in Epic for full procedure details.  Alek Borges S Deasha Clendenin PA-C 05/07/2018 4:14 PM

## 2018-05-21 NOTE — Progress Notes (Signed)
HEMATOLOGY/ONCOLOGY CONSULTATION NOTE  Date of Service: 05/26/2018  Patient Care Team: Patient, No Pcp Per as PCP - General (General Practice)  CHIEF COMPLAINTS/PURPOSE OF CONSULTATION:  Lymphocytosis  HISTORY OF PRESENTING ILLNESS:   [name] is a wonderful 62 y.o. female who has been referred to [place] by Dr. Emi Belfast for evaluation and management of Lymphocytosis and Lymphadenopathy. The pt reports that she is doing well overall.   The pt reports that she recently presented to her PCP Dr.Schoenhoff for bilateral inguinal masses that she reports first appeared a year ago. She notes that these masses have gotten bigger in the last six months, and they occasionally hurt while pushing or pulling. She has thus far been evaluated with CT Neck, Abdomen and Pelvis, as well as an inguinal biopsy that was benign, as noted below. She was further evaluated with a Thyroid US for a CT-observed nodule, and is awaiting a biopsy of this nodule.   The pt notes that she has had relatively less energy recently. She denies recent fevers and night sweats but notes that she has had more chills than she used to in the past 18 months. She adds that she has lost about 20 pounds in the last year, accompanied by dietary and lifestyle changes. Her appetite has been a little weaker and she has seen a nutritionist recently. She has had a recent mammogram which was unremarkable.   The pt has several cats at home but denies being scratched recently.   The pt had cervical biopsy in 1977 while pregnant, she then had a partial hysterectomy after delivery, and did not have chemotherapy or radiation therapy. She has not had concerns since then. About 10-15 years later, the pt developed ovarian cysts and subsequently had her ovaries removed as well.   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 She dealt with GI symptoms for two years before she had  exploratory surgery.  Of note prior to the patient's visit today, pt has had CT N/A/P completed on 04/02/18 with results revealing No pathologically enlarged cervical lymph nodes are seen. 11 mm partially calcified LEFT lobe thyroid nodule. Interval mild enlargement of right inguinal lymph nodes as detailed above. Some of these nodes maintain a benign fatty hilum. Findings may be reactive in etiology. No acute findings noted within the abdomen or pelvis. Status post splenectomy and distal pancreatectomy.   Most recent lab results (04/30/18) of CBC is as follows: all values are WNL except for WBC at 11.1k, MCH at 26.0, MCHC at 31.6, MPV at 11.1, Lymph abs at 4.9k.  On review of systems, pt reports expected weight loss, occasional chills, slow-growing b/l inguinal masses, and denies abdominal pains, vaginal boils, coughs, colds, flu-like symptoms, skin rashes, and any other symptoms.   On PMHx the pt reports Cervical cancer in 1977. On Family Hx the pt reports uncle with leukemia, matneral grandmother and mother with breast cancer, paternal grandmother with ovarian cancer, uterine cancer, dad with MI, maternal aunt with cancer.   Interval History:   [name] returns today for management and evaluation of her lymphocytosis. The patient's last visit with [place] was on 05/04/18. The pt reports that she is doing well overall.   The pt reports that her right inguinal lymph node is still a little sore and tender, though she has not been able to see a surgeon in the interim. She notes that she finished her course of antibiotics as  well without relief of her symptoms.   She notes that her energy levels have been stable and she has not developed any new constitutional symptoms in the brief interim. Though, her weight loss was noted at last visit and is noted today again.   Lab results (05/04/18) of CBC w/diff, CMP is as follows: all values are WNL except for MCHC at 30.9, RDW at 14.8, Lymphs abs at 3.6k,  Basophils abs at 200, Glucose at 219. 05/21/18 LDH is at 226 05/21/18 Sed Rate is at 5  On review of systems, pt reports sore right inguinal lymph node, stable energy levels, and denies concerns for infection, and any other symptoms.   MEDICAL HISTORY:  Past Medical History:  Diagnosis Date  . Cancer ([place]) 1977   CERVICAL  . GERD (gastroesophageal reflux disease) 11/14/2013  . Gout   . Mixed hyperlipidemia 11/14/2013    SURGICAL HISTORY: Past Surgical History:  Procedure Laterality Date  . 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 CHOLECYSTECTOMY    . EYE SURGERY     6/3 LASER FOR GLAUCOMA, 9/03 CE/IOL IMPLANTS  . pancreatectomy    . SHOULDER CLOSED REDUCTION Left 02/03/2013   Procedure: CLOSED MANIPULATION SHOULDER;  Surgeon: Vickey Huger, MD;  Location: [facility];  Service: Orthopedics;  Laterality: Left;  CLOSED MANIPULATION LEFT SHOULDER  . SPLENECTOMY    . TONSILLECTOMY  1970    SOCIAL HISTORY: Social History   Socioeconomic History  . Marital status: Divorced    Spouse name: Not on file  . Number of children: Not on file  . Years of education: Not on file  . Highest education level: Not on file  Occupational History  . Not on file  Social Needs  . Financial resource strain: Not on file  . Food insecurity:    Worry: Not on file    Inability: Not on file  . Transportation needs:    Medical: Not on file    Non-medical: Not on file  Tobacco Use  . Smoking status: Former Smoker    Last attempt to quit: 09/23/1989    Years since quitting: 28.6  . Smokeless tobacco: Never Used  Substance and Sexual Activity  . Alcohol use: No    Alcohol/week: 0.0 standard drinks  . Drug use: No    Comment: quit>20years ago  . Sexual activity: Not on file  Lifestyle  . Physical activity:    Days per week: Not on file    Minutes per session: Not on file  . Stress: Not on file  Relationships  . Social connections:    Talks on phone: Not on file    Gets together: Not on file      Attends religious service: Not on file    Active member of club or organization: Not on file    Attends meetings of clubs or organizations: Not on file    Relationship status: Not on file  . Intimate partner violence:    Fear of current or ex partner: Not on file    Emotionally abused: Not on file    Physically abused: Not on file    Forced sexual activity: Not on file  Other Topics Concern  . Not on file  Social History Narrative  . Not on file    FAMILY HISTORY: Family History  Problem Relation Age of Onset  . Dementia Mother   . Heart disease Mother   . Hypertension Mother   . COPD Mother   .  Breast cancer Mother 20  . Heart disease Father   . Diabetes Father   . Glaucoma Father   . Cancer Father        BLADDER  . Depression Sister     ALLERGIES:  is allergic to penicillins; pepcid [famotidine]; and prevacid [lansoprazole].  MEDICATIONS:  Current Outpatient Medications  Medication Sig Dispense Refill  . acetaminophen (TYLENOL) 500 MG tablet Take 500 mg by mouth at bedtime.     . ALPRAZolam (XANAX) 1 MG tablet Take 0.5 mg by mouth at bedtime as needed for anxiety.    Marland Kitchen aspirin EC 81 MG tablet Take 81 mg by mouth at bedtime.     . Blood Glucose Monitoring Suppl (ONETOUCH VERIO) w/Device KIT 1 kit by Does not apply route 3 (three) times daily. (Patient not taking: Reported on 04/13/2018) 1 kit 0  . doxycycline (VIBRA-TABS) 100 MG tablet Take 1 tablet (100 mg total) by mouth 2 (two) times daily. Please take over the counter probiotics while on antibiotic and for 1 week after. 10 tablet 0  . fexofenadine (ALLEGRA) 180 MG tablet Take 180 mg by mouth daily.    Marland Kitchen gabapentin (NEURONTIN) 100 MG capsule TAKE ONE CAPSULE BY MOUTH AT BEDTIME (Patient not taking: Reported on 04/13/2018) 90 capsule 1  . glucose blood (ONETOUCH VERIO) test strip CHECK BLOOD SUGAR 3 TIMES DAILY-DX-E11.9 AND Z79.4. (Patient not taking: Reported on 04/13/2018) 300 each 1  . insulin NPH-regular Human  (NOVOLIN 70/30) (70-30) 100 UNIT/ML injection Inject 10 Units into the skin 2 (two) times daily with a meal.     . losartan-hydrochlorothiazide (HYZAAR) 100-25 MG tablet Take 1/2 to 1 tablet daily for BP (Patient not taking: Reported on 01/22/2017) 30 tablet 2  . ONETOUCH DELICA LANCETS 02I MISC Check blood sugar 3 times daily-DX-E11.9 and Z79.4. (Patient not taking: Reported on 04/13/2018) 300 each 1  . pregabalin (LYRICA) 75 MG capsule Take 1 capsule (75 mg total) by mouth 2 (two) times daily. 60 capsule 0  . Propylene Glycol (SYSTANE COMPLETE) 0.6 % SOLN Place 1 drop into both eyes 2 (two) times daily as needed (for dry eyes).     No current facility-administered medications for this visit.     REVIEW OF SYSTEMS:    A 10+ POINT REVIEW OF SYSTEMS WAS OBTAINED including neurology, dermatology, psychiatry, cardiac, respiratory, lymph, extremities, GI, GU, Musculoskeletal, constitutional, breasts, reproductive, HEENT.  All pertinent positives are noted in the HPI.  All others are negative.   PHYSICAL EXAMINATION: ECOG PERFORMANCE STATUS: 1 - Symptomatic but completely ambulatory  Vitals:   05/26/18 0853  BP: (!) 148/74  Pulse: 71  Resp: 18  Temp: 98.4 F (36.9 C)  SpO2: 99%   Filed Weights   05/26/18 0853  Weight: 133 lb 0.8 oz (60.4 kg)   .Body mass index is 25.98 kg/m.  GENERAL:alert, in no acute distress and comfortable SKIN: no acute rashes, no significant lesions EYES: conjunctiva are pink and non-injected, sclera anicteric OROPHARYNX: MMM, no exudates, no oropharyngeal erythema or ulceration NECK: supple, no JVD LYMPH:  Palpable inguinal lymph nodes about 2cm that are tender to palpation, no palpable lymphadenopathy in the cervical or axillary regions LUNGS: clear to auscultation b/l with normal respiratory effort HEART: regular rate & rhythm ABDOMEN:  normoactive bowel sounds , non tender, not distended. No palpable hepatosplenomegaly.  Extremity: no pedal edema PSYCH:  alert & oriented x 3 with fluent speech NEURO: no focal motor/sensory deficits   LABORATORY DATA:  I have reviewed  the data as listed  . CBC Latest Ref Rng & Units 05/04/2018 04/15/2018 12/24/2016  WBC 3.9 - 10.3 K/uL 8.5 13.2(H) 8.8  Hemoglobin 11.6 - 15.9 g/dL 12.8 12.7 12.1  Hematocrit 34.8 - 46.6 % 41.4 41.9 40.5  Platelets 145 - 400 K/uL 263 283 430(H)    . CMP Latest Ref Rng & Units 05/04/2018 12/24/2016 05/19/2015  Glucose 70 - 99 mg/dL 219(H) 48(L) 102(H)  BUN 8 - 23 mg/dL 16 18 13   Creatinine 0.44 - 1.00 mg/dL 0.93 0.59 0.59  Sodium 135 - 145 mmol/L 140 145 142  Potassium 3.5 - 5.1 mmol/L 4.1 5.3 4.4  Chloride 98 - 111 mmol/L 102 102 100  CO2 22 - 32 mmol/L 28 32(H) 31  Calcium 8.9 - 10.3 mg/dL 9.6 9.9 9.5  Total Protein 6.5 - 8.1 g/dL 7.8 6.8 6.3  Total Bilirubin 0.3 - 1.2 mg/dL 0.4 0.3 0.4  Alkaline Phos 38 - 126 U/L 118 157(H) 103  AST 15 - 41 U/L 23 21 19   ALT 0 - 44 U/L 18 22 15      Component     Latest Ref Rng & Units 05/04/2018  LDH     98 - 192 U/L 226 (H)  Sed Rate     0 - 22 mm/hr 5    04/30/18 CBC w/diff:    04/15/18 Inguinal LN Needle/Core Biopsy:   RADIOGRAPHIC STUDIES: I have personally reviewed the radiological images as listed and agreed with the findings in the report. [place] Fna Bx Thyroid 1st Lesion Afirma  Result Date: 05/07/2018 INDICATION: Indeterminate thyroid nodule EXAM: ULTRASOUND GUIDED FINE NEEDLE ASPIRATION OF INDETERMINATE THYROID NODULE COMPARISON:  Ultrasound done April 27, 2018 MEDICATIONS: 1% lidocaine 4 mL COMPLICATIONS: None immediate. TECHNIQUE: Informed written consent was obtained from the patient after a discussion of the risks, benefits and alternatives to treatment. Questions regarding the procedure were encouraged and answered. A timeout was performed prior to the initiation of the procedure. Pre-procedural ultrasound scanning demonstrated unchanged size and appearance of the indeterminate nodule within the left lobe of the  thyroid. The procedure was planned. The neck was prepped in the usual sterile fashion, and a sterile drape was applied covering the operative field. A timeout was performed prior to the initiation of the procedure. Local anesthesia was provided with 1% lidocaine. Under direct ultrasound guidance, 4 FNA biopsies were performed of the left mid thyroid nodule with a 25 gauge needle. Multiple ultrasound images were saved for procedural documentation purposes. The samples were prepared and submitted to pathology. Limited post procedural scanning was negative for hematoma or additional complication. Dressings were placed. The patient tolerated the above procedures procedure well without immediate postprocedural complication. FINDINGS: FINDINGS Nodule reference number based on prior diagnostic ultrasound: 3 Maximum size: 1.6 cm Location: Left  ;  Mid ACR TI-RADS risk category:  TR4 Reason for biopsy: meets ACR TI-RADS criteria Ultrasound imaging confirms appropriate placement of the needles within the thyroid nodule. IMPRESSION: Technically successful ultrasound guided fine needle aspiration of left mid thyroid nodule. Read by: Gareth Eagle, PA-C Electronically Signed   By: Lucrezia Europe M.D.   On: 05/07/2018 16:40   US Thyroid  Result Date: 04/27/2018 CLINICAL DATA:  Incidental on CT.  Left thyroid nodule EXAM: THYROID ULTRASOUND TECHNIQUE: Ultrasound examination of the thyroid gland and adjacent soft tissues was performed. COMPARISON:  None. FINDINGS: Parenchymal Echotexture: Mildly heterogenous Isthmus: 0.3 cm Right lobe: 4.6 x 1.6 x 1.8 cm Left lobe: 4.3 x 1.7 x 1.8 cm  _________________________________________________________ Estimated total number of nodules >/= 1 cm: 1 Number of spongiform nodules >/=  2 cm not described below (TR1): 0 Number of mixed cystic and solid nodules >/= 1.5 cm not described below (Hagerman): 0 _________________________________________________________ Nodule # 3: Location: Left; Mid Maximum size:  1.6 cm; Other 2 dimensions: 1.3 x 1.3 cm Composition: solid/almost completely solid (2) Echogenicity: isoechoic (1) Shape: not taller-than-wide (0) Margins: smooth (0) Echogenic foci: punctate echogenic foci (3) ACR TI-RADS total points: 6. ACR TI-RADS risk category: TR4 (4-6 points). ACR TI-RADS recommendations: **Given size (>/= 1.5 cm) and appearance, fine needle aspiration of this moderately suspicious nodule should be considered based on TI-RADS criteria. _________________________________________________________ Several other nodules measure 0.9 cm or less and do not meet criteria for biopsy nor follow-up. IMPRESSION: Left nodule 3 meets criteria for fine needle aspiration biopsy and corresponds to the abnormality noted on CT. The above is in keeping with the ACR TI-RADS recommendations - J Am Coll Radiol 2017;14:587-595. Electronically Signed   By: Marybelle Killings M.D.   On: 04/27/2018 15:39    ASSESSMENT & PLAN:  62 y.o. female with  1. Lymphocytosis -Patient's labs on presentation from 04/30/18, no anemia with HGB at 12.5, no thrombocytosis with PLT at 245k, some lymphocytosis with Lymphs abs at 4.9k -Pt's lymphocytosis have been stable over the last three years and her occasional thrombocytosis is in the context of a splenectomy ? Related to splenectomy  04/02/18 CT Neck revealed No pathologically enlarged cervical lymph nodes are seen. 11 mm partially calcified LEFT lobe thyroid nodule, recommend sonography for further characterization.    04/02/18 CT A/P revealed Interval mild enlargement of right inguinal lymph nodes as detailed above. Some of these nodes maintain a benign fatty hilum. Findings may be reactive in etiology. No acute findings noted within the abdomen or pelvis. Status post splenectomy and distal pancreatectomy   04/15/18 Right Inguinal needle/core biopsy revealed benign lymph nodal tissue without evidence of malignancy   PLAN:  -Recommended that the pt keep up to date with her  post-splenectomy vaccinations with PCP -Pt's inguinal lymph nodes are palpable, tender and are reported to have grown in size prior to her presentation -Did prescribe a five day course of empiric Doxycycline and recommend also using probiotics, pt did not experience relief -Discussed pt labwork from 05/04/18; no anemia, no thrombocytosis, Lymphs slightly increased to 3.6k, LDH slightly elevated at 226, normal sed rate. -Discussed the 05/04/18 Peripheral flow cytometry was unremarkable for a clonal lymphoproliferative process. -Proceed with surgical referral as placed at last visit -Will see pt back after surgical biopsy of right enlarged inguinal lymph node    -Surgery referral to [facility] [place] [facility] placed on last visit not done- needs to be urgent for rt inguinal LN biopsy -RTC with Dr Irene Limbo in 4 weeks with surgery evaluation/LN biopsy and labs    All of the patients questions were answered with apparent satisfaction. The patient knows to call the clinic with any problems, questions or concerns.  The total time spent in the appt was15 minutes and more than 50% was on counseling and direct patient cares.    Sullivan Lone MD MS AAHIVMS [facility] Hematology/Oncology Physician [facility]  (Office):       [phone] (Work cell):  [phone] (Fax):           [phone]  05/26/2018 9:24 AM  I, Baldwin [place], am acting as a scribe for Dr. Irene Limbo  .I have reviewed the above documentation for accuracy  and completeness, and I agree with the above. Brunetta Genera MD

## 2018-05-26 ENCOUNTER — Encounter: Payer: Self-pay | Admitting: Hematology

## 2018-05-26 ENCOUNTER — Inpatient Hospital Stay: Payer: BC Managed Care – PPO | Attending: Hematology | Admitting: Hematology

## 2018-05-26 VITALS — BP 148/74 | HR 71 | Temp 98.4°F | Resp 18 | Wt 133.1 lb

## 2018-05-26 DIAGNOSIS — D72820 Lymphocytosis (symptomatic): Secondary | ICD-10-CM

## 2018-05-26 DIAGNOSIS — R599 Enlarged lymph nodes, unspecified: Secondary | ICD-10-CM | POA: Diagnosis not present

## 2018-05-26 DIAGNOSIS — R590 Localized enlarged lymph nodes: Secondary | ICD-10-CM

## 2018-05-26 DIAGNOSIS — Z87891 Personal history of nicotine dependence: Secondary | ICD-10-CM | POA: Diagnosis not present

## 2018-05-26 DIAGNOSIS — R634 Abnormal weight loss: Secondary | ICD-10-CM | POA: Insufficient documentation

## 2018-05-27 ENCOUNTER — Telehealth: Payer: Self-pay

## 2018-05-27 NOTE — Telephone Encounter (Signed)
Scheduled appointments and resent fax  referal will call to verify they received it. Per 9/3 los Will mail a letter with the upcoming appointment also

## 2018-05-28 ENCOUNTER — Telehealth: Payer: Self-pay

## 2018-05-28 NOTE — Telephone Encounter (Signed)
Called to verify that fax was received from previus sent. Lynn Ito) said nether was received. Refaxed again today and in profiency also. Per 9/5 follow up

## 2018-05-29 ENCOUNTER — Encounter (INDEPENDENT_AMBULATORY_CARE_PROVIDER_SITE_OTHER): Payer: BC Managed Care – PPO | Admitting: Ophthalmology

## 2018-05-29 DIAGNOSIS — H43813 Vitreous degeneration, bilateral: Secondary | ICD-10-CM | POA: Diagnosis not present

## 2018-05-29 DIAGNOSIS — E11311 Type 2 diabetes mellitus with unspecified diabetic retinopathy with macular edema: Secondary | ICD-10-CM

## 2018-05-29 DIAGNOSIS — E113313 Type 2 diabetes mellitus with moderate nonproliferative diabetic retinopathy with macular edema, bilateral: Secondary | ICD-10-CM | POA: Diagnosis not present

## 2018-06-03 ENCOUNTER — Ambulatory Visit: Payer: Self-pay | Admitting: Surgery

## 2018-06-03 NOTE — H&P (View-Only) (Signed)
Surgical H&P CC: lymphadenopathy  HPI:  Very pleasant 62 year old woman referred for right inguinal lymph node biopsy. She states that she has had increasing size and painful swelling in the right groin this is aggravated by physical activity for example standing in a grocery store or doing chores at home.  Denies any GI or urinary symptoms.  She has been undergoing a workup for leukocytosis and has had the right groin nodes core needle biopsied with benign results however they continue to increase in size.  She had a CT scan in July that demonstrates mildly enlarged superficial right inguinal lymph nodes.  I have looked at the skin myself as well and do not see any hernia despite her complaints sounding more like a hernia than lymphadenopathy.  Allergies  Allergen Reactions  . Penicillins Hives and Other (See Comments)    Has patient had a PCN reaction causing immediate rash, facial/tongue/throat swelling, SOB or lightheadedness with hypotension: Unknown Has patient had a PCN reaction causing severe rash involving mucus membranes or skin necrosis: Unknown Has patient had a PCN reaction that required hospitalization: Unknown Has patient had a PCN reaction occurring within the last 10 years: No If all of the above answers are "NO", then may proceed with Cephalosporin use.   Marland Kitchen Pepcid [Famotidine] Other (See Comments)    HEADACHE  . Prevacid [Lansoprazole] Diarrhea    Past Medical History:  Diagnosis Date  . 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 GERD (gastroesophageal reflux disease) 11/14/2013  . Gout   . Mixed hyperlipidemia 11/14/2013    Past Surgical History:  Procedure Laterality Date  . 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 CHOLECYSTECTOMY    . EYE SURGERY     6/3 LASER FOR GLAUCOMA, 9/03 CE/IOL IMPLANTS  . pancreatectomy    . SHOULDER CLOSED REDUCTION Left 02/03/2013   Procedure: CLOSED MANIPULATION SHOULDER;  Surgeon: Vickey Huger, MD;  Location: [facility];  Service: Orthopedics;   Laterality: Left;  CLOSED MANIPULATION LEFT SHOULDER  . SPLENECTOMY    . TONSILLECTOMY  1970    Family History  Problem Relation Age of Onset  . Dementia Mother   . Heart disease Mother   . Hypertension Mother   . COPD Mother   . Breast cancer Mother 69  . Heart disease Father   . Diabetes Father   . Glaucoma Father   . Cancer Father        BLADDER  . Depression Sister     Social History   Socioeconomic History  . Marital status: Divorced    Spouse name: Not on file  . Number of children: Not on file  . Years of education: Not on file  . Highest education level: Not on file  Occupational History  . Not on file  Social Needs  . Financial resource strain: Not on file  . Food insecurity:    Worry: Not on file    Inability: Not on file  . Transportation needs:    Medical: Not on file    Non-medical: Not on file  Tobacco Use  . Smoking status: Former Smoker    Last attempt to quit: 09/23/1989    Years since quitting: 28.7  . Smokeless tobacco: Never Used  Substance and Sexual Activity  . Alcohol use: No    Alcohol/week: 0.0 standard drinks  . Drug use: No    Comment: quit>20years ago  . Sexual activity: Not on file  Lifestyle  . Physical activity:  Days per week: Not on file    Minutes per session: Not on file  . Stress: Not on file  Relationships  . Social connections:    Talks on phone: Not on file    Gets together: Not on file    Attends religious service: Not on file    Active member of club or organization: Not on file    Attends meetings of clubs or organizations: Not on file    Relationship status: Not on file  Other Topics Concern  . Not on file  Social History Narrative  . Not on file    Current Outpatient Medications on File Prior to Visit  Medication Sig Dispense Refill  . acetaminophen (TYLENOL) 500 MG tablet Take 500 mg by mouth at bedtime.     . ALPRAZolam (XANAX) 1 MG tablet Take 0.5 mg by mouth at bedtime as needed for anxiety.    Marland Kitchen  aspirin EC 81 MG tablet Take 81 mg by mouth at bedtime.     . Blood Glucose Monitoring Suppl (ONETOUCH VERIO) w/Device KIT 1 kit by Does not apply route 3 (three) times daily. (Patient not taking: Reported on 04/13/2018) 1 kit 0  . doxycycline (VIBRA-TABS) 100 MG tablet Take 1 tablet (100 mg total) by mouth 2 (two) times daily. Please take over the counter probiotics while on antibiotic and for 1 week after. 10 tablet 0  . fexofenadine (ALLEGRA) 180 MG tablet Take 180 mg by mouth daily.    Marland Kitchen gabapentin (NEURONTIN) 100 MG capsule TAKE ONE CAPSULE BY MOUTH AT BEDTIME (Patient not taking: Reported on 04/13/2018) 90 capsule 1  . glucose blood (ONETOUCH VERIO) test strip CHECK BLOOD SUGAR 3 TIMES DAILY-DX-E11.9 AND Z79.4. (Patient not taking: Reported on 04/13/2018) 300 each 1  . insulin NPH-regular Human (NOVOLIN 70/30) (70-30) 100 UNIT/ML injection Inject 10 Units into the skin 2 (two) times daily with a meal.     . losartan-hydrochlorothiazide (HYZAAR) 100-25 MG tablet Take 1/2 to 1 tablet daily for BP (Patient not taking: Reported on 01/22/2017) 30 tablet 2  . ONETOUCH DELICA LANCETS 99I MISC Check blood sugar 3 times daily-DX-E11.9 and Z79.4. (Patient not taking: Reported on 04/13/2018) 300 each 1  . pregabalin (LYRICA) 75 MG capsule Take 1 capsule (75 mg total) by mouth 2 (two) times daily. 60 capsule 0  . Propylene Glycol (SYSTANE COMPLETE) 0.6 % SOLN Place 1 drop into both eyes 2 (two) times daily as needed (for dry eyes).     No current facility-administered medications on file prior to visit.     Review of Systems: a complete, 10pt review of systems was completed with pertinent positives and negatives as documented in the HPI  Physical Exam: There were no vitals filed for this visit. Gen: A&Ox3, no distress  Head: normocephalic, atraumatic Eyes: extraocular motions intact, anicteric.  Neck: supple without mass or thyromegaly Chest: unlabored respirations, symmetrical air entry, clear  bilaterally   Cardiovascular: RRR with palpable distal pulses, no pedal edema Abdomen: soft, nondistended, nontender. No mass or organomegaly. Well-healed upper and lower midline scars Extremities: warm, without edema, no deformities  Neuro: grossly intact Psych: appropriate mood and affect, normal insight  Skin: warm and dry There is vaguely palpable lymphadenopathy in the right groin.  No palpable hernia.  Tender.   CBC Latest Ref Rng & Units 05/04/2018 04/15/2018 12/24/2016  WBC 3.9 - 10.3 K/uL 8.5 13.2(H) 8.8  Hemoglobin 11.6 - 15.9 g/dL 12.8 12.7 12.1  Hematocrit 34.8 - 46.6 % 41.4  41.9 40.5  Platelets 145 - 400 K/uL 263 283 430(H)    CMP Latest Ref Rng & Units 05/04/2018 12/24/2016 05/19/2015  Glucose 70 - 99 mg/dL 219(H) 48(L) 102(H)  BUN 8 - 23 mg/dL _0 Creatinine 0.44 - 1.00 mg/dL 0.93 0.59 0.59  Sodium 135 - 145 mmol/L 140 145 142  Potassium 3.5 - 5.1 mmol/L 4.1 5.3 4.4  Chloride 98 - 111 mmol/L 102 102 100  CO2 22 - 32 mmol/L 28 32(H) 31  Calcium 8.9 - 10.3 mg/dL 9.6 9.9 9.5  Total Protein 6.5 - 8.1 g/dL 7.8 6.8 6.3  Total Bilirubin 0.3 - 1.2 mg/dL 0.4 0.3 0.4  Alkaline Phos 38 - 126 U/L 118 157(H) 103  AST 15 - 41 U/L _1 ALT 0 - 44 U/L _2 Lab Results  Component Value Date   INR 1.01 04/15/2018   INR 0.98 05/31/2010    Imaging: No results found.    A/P: right inguinal lymphadenopathy increasing in size subjectively for the patient.  Core needle biopsy negative but due to increasing size excisional biopsy requested.  I discussed the procedure with the patient including risks of bleeding, infection, pain, scarring, injury to structures in the groin including nerves, blood vessels, risk of lymphocele or lymph edema, and also discussed with her that it is not likely that biopsying the lymph node will relieve the pain in her groin.  This will however give [place] definitive information about the etiology of the right groin lymphadenopathy.  Questions were  answered to her satisfaction and she desires to proceed.   Romana Juniper, MD [facility], [place] Pager [phone]

## 2018-06-03 NOTE — H&P (Signed)
Surgical H&P CC: lymphadenopathy  HPI:  Very pleasant 62 year old woman referred for right inguinal lymph node biopsy. She states that she has had increasing size and painful swelling in the right groin this is aggravated by physical activity for example standing in a grocery store or doing chores at home.  Denies any GI or urinary symptoms.  She has been undergoing a workup for leukocytosis and has had the right groin nodes core needle biopsied with benign results however they continue to increase in size.  She had a CT scan in July that demonstrates mildly enlarged superficial right inguinal lymph nodes.  I have looked at the skin myself as well and do not see any hernia despite her complaints sounding more like a hernia than lymphadenopathy.  Allergies  Allergen Reactions  . Penicillins Hives and Other (See Comments)    Has patient had a PCN reaction causing immediate rash, facial/tongue/throat swelling, SOB or lightheadedness with hypotension: Unknown Has patient had a PCN reaction causing severe rash involving mucus membranes or skin necrosis: Unknown Has patient had a PCN reaction that required hospitalization: Unknown Has patient had a PCN reaction occurring within the last 10 years: No If all of the above answers are "NO", then may proceed with Cephalosporin use.   Marland Kitchen Pepcid [Famotidine] Other (See Comments)    HEADACHE  . Prevacid [Lansoprazole] Diarrhea    Past Medical History:  Diagnosis Date  . 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 GERD (gastroesophageal reflux disease) 11/14/2013  . Gout   . Mixed hyperlipidemia 11/14/2013    Past Surgical History:  Procedure Laterality Date  . 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 CHOLECYSTECTOMY    . EYE SURGERY     6/3 LASER FOR GLAUCOMA, 9/03 CE/IOL IMPLANTS  . pancreatectomy    . SHOULDER CLOSED REDUCTION Left 02/03/2013   Procedure: CLOSED MANIPULATION SHOULDER;  Surgeon: Vickey Huger, MD;  Location: [facility];  Service: Orthopedics;   Laterality: Left;  CLOSED MANIPULATION LEFT SHOULDER  . SPLENECTOMY    . TONSILLECTOMY  1970    Family History  Problem Relation Age of Onset  . Dementia Mother   . Heart disease Mother   . Hypertension Mother   . COPD Mother   . Breast cancer Mother 69  . Heart disease Father   . Diabetes Father   . Glaucoma Father   . Cancer Father        BLADDER  . Depression Sister     Social History   Socioeconomic History  . Marital status: Divorced    Spouse name: Not on file  . Number of children: Not on file  . Years of education: Not on file  . Highest education level: Not on file  Occupational History  . Not on file  Social Needs  . Financial resource strain: Not on file  . Food insecurity:    Worry: Not on file    Inability: Not on file  . Transportation needs:    Medical: Not on file    Non-medical: Not on file  Tobacco Use  . Smoking status: Former Smoker    Last attempt to quit: 09/23/1989    Years since quitting: 28.7  . Smokeless tobacco: Never Used  Substance and Sexual Activity  . Alcohol use: No    Alcohol/week: 0.0 standard drinks  . Drug use: No    Comment: quit>20years ago  . Sexual activity: Not on file  Lifestyle  . Physical activity:  Days per week: Not on file    Minutes per session: Not on file  . Stress: Not on file  Relationships  . Social connections:    Talks on phone: Not on file    Gets together: Not on file    Attends religious service: Not on file    Active member of club or organization: Not on file    Attends meetings of clubs or organizations: Not on file    Relationship status: Not on file  Other Topics Concern  . Not on file  Social History Narrative  . Not on file    Current Outpatient Medications on File Prior to Visit  Medication Sig Dispense Refill  . acetaminophen (TYLENOL) 500 MG tablet Take 500 mg by mouth at bedtime.     . ALPRAZolam (XANAX) 1 MG tablet Take 0.5 mg by mouth at bedtime as needed for anxiety.    Marland Kitchen  aspirin EC 81 MG tablet Take 81 mg by mouth at bedtime.     . Blood Glucose Monitoring Suppl (ONETOUCH VERIO) w/Device KIT 1 kit by Does not apply route 3 (three) times daily. (Patient not taking: Reported on 04/13/2018) 1 kit 0  . doxycycline (VIBRA-TABS) 100 MG tablet Take 1 tablet (100 mg total) by mouth 2 (two) times daily. Please take over the counter probiotics while on antibiotic and for 1 week after. 10 tablet 0  . fexofenadine (ALLEGRA) 180 MG tablet Take 180 mg by mouth daily.    Marland Kitchen gabapentin (NEURONTIN) 100 MG capsule TAKE ONE CAPSULE BY MOUTH AT BEDTIME (Patient not taking: Reported on 04/13/2018) 90 capsule 1  . glucose blood (ONETOUCH VERIO) test strip CHECK BLOOD SUGAR 3 TIMES DAILY-DX-E11.9 AND Z79.4. (Patient not taking: Reported on 04/13/2018) 300 each 1  . insulin NPH-regular Human (NOVOLIN 70/30) (70-30) 100 UNIT/ML injection Inject 10 Units into the skin 2 (two) times daily with a meal.     . losartan-hydrochlorothiazide (HYZAAR) 100-25 MG tablet Take 1/2 to 1 tablet daily for BP (Patient not taking: Reported on 01/22/2017) 30 tablet 2  . ONETOUCH DELICA LANCETS 33H MISC Check blood sugar 3 times daily-DX-E11.9 and Z79.4. (Patient not taking: Reported on 04/13/2018) 300 each 1  . pregabalin (LYRICA) 75 MG capsule Take 1 capsule (75 mg total) by mouth 2 (two) times daily. 60 capsule 0  . Propylene Glycol (SYSTANE COMPLETE) 0.6 % SOLN Place 1 drop into both eyes 2 (two) times daily as needed (for dry eyes).     No current facility-administered medications on file prior to visit.     Review of Systems: a complete, 10pt review of systems was completed with pertinent positives and negatives as documented in the HPI  Physical Exam: There were no vitals filed for this visit. Gen: A&Ox3, no distress  Head: normocephalic, atraumatic Eyes: extraocular motions intact, anicteric.  Neck: supple without mass or thyromegaly Chest: unlabored respirations, symmetrical air entry, clear  bilaterally   Cardiovascular: RRR with palpable distal pulses, no pedal edema Abdomen: soft, nondistended, nontender. No mass or organomegaly. Well-healed upper and lower midline scars Extremities: warm, without edema, no deformities  Neuro: grossly intact Psych: appropriate mood and affect, normal insight  Skin: warm and dry There is vaguely palpable lymphadenopathy in the right groin.  No palpable hernia.  Tender.   CBC Latest Ref Rng & Units 05/04/2018 04/15/2018 12/24/2016  WBC 3.9 - 10.3 K/uL 8.5 13.2(H) 8.8  Hemoglobin 11.6 - 15.9 g/dL 12.8 12.7 12.1  Hematocrit 34.8 - 46.6 % 41.4  41.9 40.5  Platelets 145 - 400 K/uL 263 283 430(H)    CMP Latest Ref Rng & Units 05/04/2018 12/24/2016 05/19/2015  Glucose 70 - 99 mg/dL 219(H) 48(L) 102(H)  BUN 8 - 23 mg/dL _0 Creatinine 0.44 - 1.00 mg/dL 0.93 0.59 0.59  Sodium 135 - 145 mmol/L 140 145 142  Potassium 3.5 - 5.1 mmol/L 4.1 5.3 4.4  Chloride 98 - 111 mmol/L 102 102 100  CO2 22 - 32 mmol/L 28 32(H) 31  Calcium 8.9 - 10.3 mg/dL 9.6 9.9 9.5  Total Protein 6.5 - 8.1 g/dL 7.8 6.8 6.3  Total Bilirubin 0.3 - 1.2 mg/dL 0.4 0.3 0.4  Alkaline Phos 38 - 126 U/L 118 157(H) 103  AST 15 - 41 U/L _1 ALT 0 - 44 U/L _2 Lab Results  Component Value Date   INR 1.01 04/15/2018   INR 0.98 05/31/2010    Imaging: No results found.    A/P: right inguinal lymphadenopathy increasing in size subjectively for the patient.  Core needle biopsy negative but due to increasing size excisional biopsy requested.  I discussed the procedure with the patient including risks of bleeding, infection, pain, scarring, injury to structures in the groin including nerves, blood vessels, risk of lymphocele or lymph edema, and also discussed with her that it is not likely that biopsying the lymph node will relieve the pain in her groin.  This will however give [place] definitive information about the etiology of the right groin lymphadenopathy.  Questions were  answered to her satisfaction and she desires to proceed.   Romana Juniper, MD [facility], [place] Pager [phone]

## 2018-06-04 ENCOUNTER — Encounter (HOSPITAL_COMMUNITY)
Admission: RE | Admit: 2018-06-04 | Discharge: 2018-06-04 | Disposition: A | Discharge status: Home / Self Care | Payer: BC Managed Care – PPO | Source: Ambulatory Visit | Attending: Surgery | Admitting: Surgery

## 2018-06-04 ENCOUNTER — Encounter (HOSPITAL_COMMUNITY): Payer: Self-pay

## 2018-06-04 ENCOUNTER — Other Ambulatory Visit: Payer: Self-pay

## 2018-06-04 DIAGNOSIS — R590 Localized enlarged lymph nodes: Secondary | ICD-10-CM | POA: Insufficient documentation

## 2018-06-04 DIAGNOSIS — Z01818 Encounter for other preprocedural examination: Secondary | ICD-10-CM | POA: Insufficient documentation

## 2018-06-04 LAB — BASIC METABOLIC PANEL
ANION GAP: 8 (ref 5–15)
BUN: 25 mg/dL — ABNORMAL HIGH (ref 8–23)
CALCIUM: 10 mg/dL (ref 8.9–10.3)
CO2: 30 mmol/L (ref 22–32)
Chloride: 106 mmol/L (ref 98–111)
Creatinine, Ser: 0.83 mg/dL (ref 0.44–1.00)
GFR calc Af Amer: >60 mL/min (ref >60)
GFR calc non Af Amer: >60 mL/min (ref >60)
GLUCOSE: 115 mg/dL — AB (ref 70–99)
Potassium: 4.6 mmol/L (ref 3.5–5.1)
Sodium: 144 mmol/L (ref 135–145)

## 2018-06-04 LAB — CBC WITH DIFFERENTIAL/PLATELET
BASOS ABS: 0.1 10*3/uL (ref 0.0–0.1)
BASOS PCT: 1 %
EOS PCT: 1 %
Eosinophils Absolute: 0.1 10*3/uL (ref 0.0–0.7)
HEMATOCRIT: 40 % (ref 36.0–46.0)
Hemoglobin: 12.6 g/dL (ref 12.0–15.0)
Lymphocytes Relative: 43 %
Lymphs Abs: 4.2 10*3/uL — ABNORMAL HIGH (ref 0.7–4.0)
MCH: 25.8 pg — ABNORMAL LOW (ref 26.0–34.0)
MCHC: 31.5 g/dL (ref 30.0–36.0)
MCV: 82 fL (ref 78.0–100.0)
MONO ABS: 0.5 10*3/uL (ref 0.1–1.0)
Monocytes Relative: 5 %
NEUTROS ABS: 5 10*3/uL (ref 1.7–7.7)
Neutrophils Relative %: 50 %
PLATELETS: 341 10*3/uL (ref 150–400)
RBC: 4.88 MIL/uL (ref 3.87–5.11)
RDW: 15.3 % (ref 11.5–15.5)
WBC: 9.8 10*3/uL (ref 4.0–10.5)

## 2018-06-04 LAB — GLUCOSE, CAPILLARY: GLUCOSE-CAPILLARY: 187 mg/dL — AB (ref 70–99)

## 2018-06-04 NOTE — Patient Instructions (Addendum)
[name]  06/04/2018   Your procedure is scheduled on: 06/08/2018     Report to [facility] Main  Entrance    Report to Admitting at  05:30 AM    Call this number if you have problems the morning of surgery [phone]   Remember: Do not eat food or drink liquids :After Midnight.     Take these medicines the morning of surgery with A SIP OF WATER:  Lyrica. Clonazepam if needed, Allegra if needed, Eye drops if needed                               You may not have any metal on your body including hair pins and              piercings  Do not wear jewelry, make-up, lotions, powders or perfumes, deodorant             Do not wear nail polish.  Do not shave  48 hours prior to surgery.                Do not bring valuables to the hospital. [place].  Contacts, dentures or bridgework may not be worn into surgery.       Patients discharged the day of surgery will not be allowed to drive home.  Name and phone number of your driver: Audrina Marten [phone]      How to Manage Your Diabetes Before and After Surgery  Why is it important to control my blood sugar before and after surgery? . Improving blood sugar levels before and after surgery helps healing and can limit problems. . A way of improving blood sugar control is eating a healthy diet by: o  Eating less sugar and carbohydrates o  Increasing activity/exercise o  Talking with your doctor about reaching your blood sugar goals . High blood sugars (greater than 180 mg/dL) can raise your risk of infections and slow your recovery, so you will need to focus on controlling your diabetes during the weeks before surgery. . Make sure that the doctor who takes care of your diabetes knows about your planned surgery including the date and location.  How do I manage my blood sugar before surgery? . Check your blood sugar at least 4 times a day, starting 2 days  before surgery, to make sure that the level is not too high or low. o Check your blood sugar the morning of your surgery when you wake up and every 2 hours until you get to the Short Stay unit. . If your blood sugar is less than 70 mg/dL, you will need to treat for low blood sugar: o Do not take insulin. o Treat a low blood sugar (less than 70 mg/dL) with  cup of clear juice (cranberry or apple), 4 glucose tablets, OR glucose gel. o Recheck blood sugar in 15 minutes after treatment (to make sure it is greater than 70 mg/dL). If your blood sugar is not greater than 70 mg/dL on recheck, call [phone] for further instructions. . Report your blood sugar to the short stay nurse when you get to Short Stay.  . If you are admitted to the hospital after surgery: o Your blood sugar will be  checked by the staff and you will probably be given insulin after surgery (instead of oral diabetes medicines) to make sure you have good blood sugar levels. o The goal for blood sugar control after surgery is 80-180 mg/dL.   WHAT DO I DO ABOUT MY DIABETES MEDICATION?  Marland Kitchen   . THE DAY BEFORE SURGERY, take only 8 U of your morning dose of Novolog 70/30 insulin. And, only 7 U of your bedtime dose of Novolog 70/30.       . The day of surgery, do not take other diabetes injectables, including Byetta (exenatide), Bydureon (exenatide ER), Victoza (liraglutide), or Trulicity (dulaglutide).                Please read over the following fact sheets you were given: _____________________________________________________________________             [facility] - Preparing for Surgery Before surgery, you can play an important role.  Because skin is not sterile, your skin needs to be as free of germs as possible.  You can reduce the number of germs on your skin by washing with CHG (chlorahexidine gluconate) soap before surgery.  CHG is an antiseptic cleaner which kills germs and bonds with the skin to continue killing germs  even after washing. Please DO NOT use if you have an allergy to CHG or antibacterial soaps.  If your skin becomes reddened/irritated stop using the CHG and inform your nurse when you arrive at Short Stay. Do not shave (including legs and underarms) for at least 48 hours prior to the first CHG shower.  You may shave your face/neck. Please follow these instructions carefully:  1.  Shower with CHG Soap the night before surgery and the  morning of Surgery.  2.  If you choose to wash your hair, wash your hair first as usual with your  normal  shampoo.  3.  After you shampoo, rinse your hair and body thoroughly to remove the  shampoo.                           4.  Use CHG as you would any other liquid soap.  You can apply chg directly  to the skin and wash                       Gently with a scrungie or clean washcloth.  5.  Apply the CHG Soap to your body ONLY FROM THE NECK DOWN.   Do not use on face/ open                           Wound or open sores. Avoid contact with eyes, ears mouth and genitals (private parts).                       Wash face,  Genitals (private parts) with your normal soap.             6.  Wash thoroughly, paying special attention to the area where your surgery  will be performed.  7.  Thoroughly rinse your body with warm water from the neck down.  8.  DO NOT shower/wash with your normal soap after using and rinsing off  the CHG Soap.                9.  Pat yourself dry with a clean towel.  10.  Wear clean pajamas.            11.  Place clean sheets on your bed the night of your first shower and do not  sleep with pets. Day of Surgery : Do not apply any lotions/deodorants the morning of surgery.  Please wear clean clothes to the hospital/surgery center.  FAILURE TO FOLLOW THESE INSTRUCTIONS MAY RESULT IN THE CANCELLATION OF YOUR SURGERY PATIENT SIGNATURE_________________________________  NURSE  SIGNATURE__________________________________  ________________________________________________________________________

## 2018-06-04 NOTE — Progress Notes (Addendum)
05-06-18 HGA1C 8.5 on chart  06-04-18 BMP result routed to Dr. Kae Heller for review.

## 2018-06-07 NOTE — Anesthesia Preprocedure Evaluation (Addendum)
Anesthesia Evaluation  Patient identified by MRN, date of birth, ID band Patient awake    Reviewed: Allergy & Precautions, NPO status , Patient's Chart, lab work & pertinent test results  Airway Mallampati: II  TM Distance: >3 FB Neck ROM: Full    Dental no notable dental hx. (+) Teeth Intact, Dental Advisory Given   Pulmonary neg pulmonary ROS, former smoker,    Pulmonary exam normal breath sounds clear to auscultation       Cardiovascular Exercise Tolerance: Good hypertension, Normal cardiovascular exam Rhythm:Regular Rate:Normal     Neuro/Psych negative neurological ROS  negative psych ROS   GI/Hepatic Neg liver ROS, GERD  ,  Endo/Other  diabetes, Type 1  Renal/GU      Musculoskeletal   Abdominal   Peds  Hematology   Anesthesia Other Findings   Reproductive/Obstetrics                            Anesthesia Physical Anesthesia Plan  ASA: III  Anesthesia Plan: General   Post-op Pain Management:    Induction: Intravenous  PONV Risk Score and Plan: 3 and Treatment may vary due to age or medical condition, Dexamethasone, Ondansetron and Scopolamine patch - Pre-op  Airway Management Planned: LMA  Additional Equipment:   Intra-op Plan:   Post-operative Plan:   Informed Consent: I have reviewed the patients History and Physical, chart, labs and discussed the procedure including the risks, benefits and alternatives for the proposed anesthesia with the patient or authorized representative who has indicated his/her understanding and acceptance.   Dental advisory given  Plan Discussed with:   Anesthesia Plan Comments:         Anesthesia Quick Evaluation

## 2018-06-08 ENCOUNTER — Encounter (HOSPITAL_COMMUNITY)
Admission: RE | Disposition: A | Discharge status: Home / Self Care | Payer: Self-pay | Source: Ambulatory Visit | Attending: Surgery

## 2018-06-08 ENCOUNTER — Encounter (HOSPITAL_COMMUNITY): Payer: Self-pay

## 2018-06-08 ENCOUNTER — Ambulatory Visit (HOSPITAL_COMMUNITY): Payer: BC Managed Care – PPO | Admitting: Anesthesiology

## 2018-06-08 ENCOUNTER — Ambulatory Visit (HOSPITAL_COMMUNITY)
Admission: RE | Admit: 2018-06-08 | Discharge: 2018-06-08 | Disposition: A | Discharge status: Home / Self Care | Payer: BC Managed Care – PPO | Source: Ambulatory Visit | Attending: Surgery | Admitting: Surgery

## 2018-06-08 DIAGNOSIS — E109 Type 1 diabetes mellitus without complications: Secondary | ICD-10-CM | POA: Diagnosis not present

## 2018-06-08 DIAGNOSIS — E782 Mixed hyperlipidemia: Secondary | ICD-10-CM | POA: Insufficient documentation

## 2018-06-08 DIAGNOSIS — R590 Localized enlarged lymph nodes: Secondary | ICD-10-CM | POA: Diagnosis present

## 2018-06-08 DIAGNOSIS — Z87891 Personal history of nicotine dependence: Secondary | ICD-10-CM | POA: Insufficient documentation

## 2018-06-08 DIAGNOSIS — I898 Other specified noninfective disorders of lymphatic vessels and lymph nodes: Secondary | ICD-10-CM | POA: Insufficient documentation

## 2018-06-08 DIAGNOSIS — I10 Essential (primary) hypertension: Secondary | ICD-10-CM | POA: Diagnosis not present

## 2018-06-08 DIAGNOSIS — Z7982 Long term (current) use of aspirin: Secondary | ICD-10-CM | POA: Insufficient documentation

## 2018-06-08 DIAGNOSIS — Z79899 Other long term (current) drug therapy: Secondary | ICD-10-CM | POA: Insufficient documentation

## 2018-06-08 DIAGNOSIS — Z794 Long term (current) use of insulin: Secondary | ICD-10-CM | POA: Insufficient documentation

## 2018-06-08 DIAGNOSIS — Z8541 Personal history of malignant neoplasm of cervix uteri: Secondary | ICD-10-CM | POA: Insufficient documentation

## 2018-06-08 HISTORY — PX: INGUINAL LYMPH NODE BIOPSY: SHX5865

## 2018-06-08 LAB — GLUCOSE, CAPILLARY
GLUCOSE-CAPILLARY: 122 mg/dL — AB (ref 70–99)
Glucose-Capillary: 80 mg/dL (ref 70–99)

## 2018-06-08 SURGERY — BIOPSY, LYMPH NODE, INGUINAL, OPEN
Anesthesia: General | Laterality: Right

## 2018-06-08 MED ORDER — DOCUSATE SODIUM 100 MG PO CAPS: 100.0000 mg | ORAL_CAPSULE | Freq: Two times a day (BID) | ORAL | 0 refills | Status: AC

## 2018-06-08 MED ORDER — BUPIVACAINE-EPINEPHRINE (PF) 0.5% -1:200000 IJ SOLN
INTRAMUSCULAR | Status: AC
  Filled 2018-06-08: qty 30

## 2018-06-08 MED ORDER — FENTANYL CITRATE (PF) 250 MCG/5ML IJ SOLN
INTRAMUSCULAR | Status: DC | PRN
  Administered 2018-06-08 (×2): 50 ug via INTRAVENOUS
  Administered 2018-06-08: 25 ug via INTRAVENOUS
  Administered 2018-06-08: 50 ug via INTRAVENOUS
  Administered 2018-06-08: 25 ug via INTRAVENOUS
  Administered 2018-06-08: 50 ug via INTRAVENOUS

## 2018-06-08 MED ORDER — PROPOFOL 10 MG/ML IV BOLUS
INTRAVENOUS | Status: DC | PRN
  Administered 2018-06-08: 120 mg via INTRAVENOUS

## 2018-06-08 MED ORDER — LIDOCAINE-EPINEPHRINE (PF) 1 %-1:200000 IJ SOLN
INTRAMUSCULAR | Status: AC
  Filled 2018-06-08: qty 30

## 2018-06-08 MED ORDER — TRAMADOL HCL 50 MG PO TABS
ORAL_TABLET | ORAL | Status: AC
  Filled 2018-06-08: qty 1

## 2018-06-08 MED ORDER — ONDANSETRON HCL 4 MG/2ML IJ SOLN
INTRAMUSCULAR | Status: DC | PRN
  Administered 2018-06-08: 4 mg via INTRAVENOUS

## 2018-06-08 MED ORDER — CHLORHEXIDINE GLUCONATE 4 % EX LIQD: 60.0000 mL | Freq: Once | CUTANEOUS | Status: DC

## 2018-06-08 MED ORDER — EPHEDRINE SULFATE-NACL 50-0.9 MG/10ML-% IV SOSY
PREFILLED_SYRINGE | INTRAVENOUS | Status: DC | PRN
  Administered 2018-06-08 (×2): 5 mg via INTRAVENOUS
  Administered 2018-06-08: 10 mg via INTRAVENOUS

## 2018-06-08 MED ORDER — HYDROCODONE-ACETAMINOPHEN 7.5-325 MG PO TABS
1.0000 | ORAL_TABLET | Freq: Once | ORAL | Status: AC | PRN
  Administered 2018-06-08: 1 via ORAL

## 2018-06-08 MED ORDER — HYDROMORPHONE HCL 1 MG/ML IJ SOLN
INTRAMUSCULAR | Status: AC
  Filled 2018-06-08: qty 1

## 2018-06-08 MED ORDER — ACETAMINOPHEN 500 MG PO TABS
1000.0000 mg | ORAL_TABLET | Freq: Once | ORAL | Status: AC
  Administered 2018-06-08: 1000 mg via ORAL
  Filled 2018-06-08: qty 2

## 2018-06-08 MED ORDER — HYDROMORPHONE HCL 1 MG/ML IJ SOLN
0.2500 mg | INTRAMUSCULAR | Status: DC | PRN
  Administered 2018-06-08 (×2): 0.5 mg via INTRAVENOUS

## 2018-06-08 MED ORDER — CELECOXIB 200 MG PO CAPS
ORAL_CAPSULE | ORAL | Status: AC
  Administered 2018-06-08: 200 mg via ORAL
  Filled 2018-06-08: qty 1

## 2018-06-08 MED ORDER — MIDAZOLAM HCL 2 MG/2ML IJ SOLN
INTRAMUSCULAR | Status: DC | PRN
  Administered 2018-06-08 (×2): 1 mg via INTRAVENOUS

## 2018-06-08 MED ORDER — SCOPOLAMINE 1 MG/3DAYS TD PT72
MEDICATED_PATCH | TRANSDERMAL | Status: AC
  Filled 2018-06-08: qty 1

## 2018-06-08 MED ORDER — LIDOCAINE 2% (20 MG/ML) 5 ML SYRINGE
INTRAMUSCULAR | Status: DC | PRN
  Administered 2018-06-08: 60 mg via INTRAVENOUS

## 2018-06-08 MED ORDER — ACETAMINOPHEN 10 MG/ML IV SOLN: 1000.0000 mg | Freq: Once | INTRAVENOUS | Status: DC | PRN

## 2018-06-08 MED ORDER — LACTATED RINGERS IV SOLN
INTRAVENOUS | Status: DC
  Administered 2018-06-08: 1000 mL via INTRAVENOUS

## 2018-06-08 MED ORDER — MEPERIDINE HCL 50 MG/ML IJ SOLN: 6.2500 mg | INTRAMUSCULAR | Status: DC | PRN

## 2018-06-08 MED ORDER — SCOPOLAMINE 1 MG/3DAYS TD PT72
MEDICATED_PATCH | TRANSDERMAL | Status: DC | PRN
  Administered 2018-06-08: 1 via TRANSDERMAL

## 2018-06-08 MED ORDER — TRAMADOL HCL 50 MG PO TABS: 50.0000 mg | ORAL_TABLET | Freq: Four times a day (QID) | ORAL | 0 refills | Status: AC | PRN

## 2018-06-08 MED ORDER — HYDROCODONE-ACETAMINOPHEN 7.5-325 MG PO TABS
ORAL_TABLET | ORAL | Status: AC
  Filled 2018-06-08: qty 1

## 2018-06-08 MED ORDER — DEXAMETHASONE SODIUM PHOSPHATE 10 MG/ML IJ SOLN
INTRAMUSCULAR | Status: DC | PRN
  Administered 2018-06-08: 10 mg via INTRAVENOUS

## 2018-06-08 MED ORDER — PROPOFOL 10 MG/ML IV BOLUS
INTRAVENOUS | Status: AC
  Filled 2018-06-08: qty 20

## 2018-06-08 MED ORDER — MIDAZOLAM HCL 2 MG/2ML IJ SOLN
INTRAMUSCULAR | Status: AC
  Filled 2018-06-08: qty 2

## 2018-06-08 MED ORDER — VANCOMYCIN HCL IN DEXTROSE 1-5 GM/200ML-% IV SOLN
1000.0000 mg | INTRAVENOUS | Status: AC
  Administered 2018-06-08: 1000 mg via INTRAVENOUS

## 2018-06-08 MED ORDER — VANCOMYCIN HCL IN DEXTROSE 1-5 GM/200ML-% IV SOLN
INTRAVENOUS | Status: AC
  Filled 2018-06-08: qty 200

## 2018-06-08 MED ORDER — PROMETHAZINE HCL 25 MG/ML IJ SOLN: 6.2500 mg | INTRAMUSCULAR | Status: DC | PRN

## 2018-06-08 MED ORDER — BUPIVACAINE-EPINEPHRINE 0.5% -1:200000 IJ SOLN
INTRAMUSCULAR | Status: DC | PRN
  Administered 2018-06-08: 10 mL

## 2018-06-08 MED ORDER — LACTATED RINGERS IV SOLN
INTRAVENOUS | Status: DC | PRN
  Administered 2018-06-08 (×2): via INTRAVENOUS

## 2018-06-08 MED ORDER — 0.9 % SODIUM CHLORIDE (POUR BTL) OPTIME
TOPICAL | Status: DC | PRN
  Administered 2018-06-08: 1000 mL

## 2018-06-08 MED ORDER — FENTANYL CITRATE (PF) 250 MCG/5ML IJ SOLN
INTRAMUSCULAR | Status: AC
  Filled 2018-06-08: qty 5

## 2018-06-08 MED ORDER — CELECOXIB 200 MG PO CAPS
200.0000 mg | ORAL_CAPSULE | ORAL | Status: AC
  Administered 2018-06-08: 200 mg via ORAL

## 2018-06-08 SURGICAL SUPPLY — 38 items
ADH SKN CLS APL DERMABOND .7 (GAUZE/BANDAGES/DRESSINGS) ×1
APL SKNCLS STERI-STRIP NONHPOA (GAUZE/BANDAGES/DRESSINGS)
BENZOIN TINCTURE PRP APPL 2/3 (GAUZE/BANDAGES/DRESSINGS) ×1 IMPLANT
BLADE SURG 15 STRL LF DISP TIS (BLADE) ×1 IMPLANT
BLADE SURG 15 STRL SS (BLADE) ×3
CHLORAPREP W/TINT 26ML (MISCELLANEOUS) ×3 IMPLANT
CLOSURE WOUND 1/2 X4 (GAUZE/BANDAGES/DRESSINGS)
COVER SURGICAL LIGHT HANDLE (MISCELLANEOUS) ×3 IMPLANT
DECANTER SPIKE VIAL GLASS SM (MISCELLANEOUS) ×1 IMPLANT
DERMABOND ADVANCED (GAUZE/BANDAGES/DRESSINGS) ×2
DERMABOND ADVANCED .7 DNX12 (GAUZE/BANDAGES/DRESSINGS) IMPLANT
DRAIN PENROSE 18X1/2 LTX STRL (DRAIN) ×1 IMPLANT
DRAPE LAPAROSCOPIC ABDOMINAL (DRAPES) ×3 IMPLANT
ELECT PENCIL ROCKER SW 15FT (MISCELLANEOUS) ×1 IMPLANT
ELECT REM PT RETURN 15FT ADLT (MISCELLANEOUS) ×3 IMPLANT
GAUZE SPONGE 4X4 12PLY STRL (GAUZE/BANDAGES/DRESSINGS) IMPLANT
GLOVE BIO SURGEON STRL SZ 6 (GLOVE) ×3 IMPLANT
GLOVE INDICATOR 6.5 STRL GRN (GLOVE) ×3 IMPLANT
GOWN STRL REUS W/TWL LRG LVL3 (GOWN DISPOSABLE) ×3 IMPLANT
GOWN STRL REUS W/TWL XL LVL3 (GOWN DISPOSABLE) ×1 IMPLANT
KIT BASIN OR (CUSTOM PROCEDURE TRAY) ×3 IMPLANT
NEEDLE HYPO 22GX1.5 SAFETY (NEEDLE) ×3 IMPLANT
PACK BASIC VI WITH GOWN DISP (CUSTOM PROCEDURE TRAY) ×3 IMPLANT
SHEARS FOC LG CVD HARMONIC 17C (MISCELLANEOUS) ×1 IMPLANT
SPONGE LAP 4X18 RFD (DISPOSABLE) ×3 IMPLANT
STRIP CLOSURE SKIN 1/2X4 (GAUZE/BANDAGES/DRESSINGS) ×1 IMPLANT
SUT ETHIBOND 0 MO6 C/R (SUTURE) ×1 IMPLANT
SUT MNCRL AB 4-0 PS2 18 (SUTURE) ×3 IMPLANT
SUT PROLENE 2 0 CT2 30 (SUTURE) ×2 IMPLANT
SUT VIC AB 3-0 SH 27 (SUTURE) ×3
SUT VIC AB 3-0 SH 27XBRD (SUTURE) ×2 IMPLANT
SUT VICRYL 3 0 BR 18  UND (SUTURE) ×2
SUT VICRYL 3 0 BR 18 UND (SUTURE) IMPLANT
SYR CONTROL 10ML LL (SYRINGE) ×3 IMPLANT
TOWEL OR 17X26 10 PK STRL BLUE (TOWEL DISPOSABLE) ×3 IMPLANT
TOWEL OR NON WOVEN STRL DISP B (DISPOSABLE) ×3 IMPLANT
TRAY FOLEY MTR SLVR 16FR STAT (SET/KITS/TRAYS/PACK) IMPLANT
YANKAUER SUCT BULB TIP 10FT TU (MISCELLANEOUS) IMPLANT

## 2018-06-08 NOTE — Anesthesia Procedure Notes (Signed)
Date/Time: 06/08/2018 8:28 AM Performed by: Cynda Familia, CRNA Oxygen Delivery Method: Simple face mask Airway Equipment and Method: Oral airway Placement Confirmation: positive ETCO2 and breath sounds checked- equal and bilateral Dental Injury: Teeth and Oropharynx as per pre-operative assessment

## 2018-06-08 NOTE — Anesthesia Procedure Notes (Signed)
Procedure Name: LMA Insertion Date/Time: 06/08/2018 7:37 AM Performed by: Cynda Familia, CRNA Pre-anesthesia Checklist: Patient identified, Emergency Drugs available, Suction available and Patient being monitored Patient Re-evaluated:Patient Re-evaluated prior to induction Oxygen Delivery Method: Circle System Utilized Preoxygenation: Pre-oxygenation with 100% oxygen Induction Type: IV induction Ventilation: Mask ventilation without difficulty LMA: LMA inserted LMA Size: 4.0 Number of attempts: 1 Airway Equipment and Method: Bite block Placement Confirmation: positive ETCO2 Tube secured with: Tape Dental Injury: Teeth and Oropharynx as per pre-operative assessment  Comments: Smooth IV induction Germeroth- LMA AM CRNA atraumatic -- teeth and mouth as preop-- bilat BS

## 2018-06-08 NOTE — Discharge Instructions (Signed)
GENERAL SURGERY: POST OP INSTRUCTIONS  ######################################################################  EAT Gradually transition to a high fiber diet with a fiber supplement over the next few weeks after discharge.  Start with a pureed / full liquid diet (see below)  WALK Walk an hour a day.  Control your pain to do that.    CONTROL PAIN Control pain so that you can walk, sleep, tolerate sneezing/coughing, go up/down stairs.  HAVE A BOWEL MOVEMENT DAILY Keep your bowels regular to avoid problems.  OK to try a laxative to override constipation.  OK to use an antidairrheal to slow down diarrhea.  Call if not better after 2 tries  CALL IF YOU HAVE PROBLEMS/CONCERNS Call if you are still struggling despite following these instructions. Call if you have concerns not answered by these instructions  ######################################################################    1. DIET: Follow a light bland diet the first 24 hours after arrival home, such as soup, liquids, crackers, etc.  Be sure to include lots of fluids daily.  Avoid fast food or heavy meals as your are more likely to get nauseated.   2. Take your usually prescribed home medications unless otherwise directed. 3. PAIN CONTROL: a. Pain is best controlled by a usual combination of three different methods TOGETHER: i. Ice/Heat ii. Over the counter pain medication iii. Prescription pain medication b. Most patients will experience some swelling and bruising around the incisions.  Ice packs or heating pads (30-60 minutes up to 6 times a day) will help. Use ice for the first few days to help decrease swelling and bruising, then switch to heat to help relax tight/sore spots and speed recovery.  Some people prefer to use ice alone, heat alone, alternating between ice & heat.  Experiment to what works for you.  Swelling and bruising can take several weeks to resolve.   c. It is helpful to take an over-the-counter pain medication  regularly for the first few weeks.  Choose one of the following that works best for you: i. Naproxen (Aleve, etc)  Two 220mg  tabs twice a day ii. Ibuprofen (Advil, etc) Three 200mg  tabs four times a day (every meal & bedtime) iii. Acetaminophen (Tylenol, etc) 500-650mg  four times a day (every meal & bedtime) d. A  prescription for pain medication (such as oxycodone, hydrocodone, etc) should be given to you upon discharge.  Take your pain medication as prescribed.  i. If you are having problems/concerns with the prescription medicine (does not control pain, nausea, vomiting, rash, itching, etc), please call us [phone] to see if we need to switch you to a different pain medicine that will work better for you and/or control your side effect better. ii. If you need a refill on your pain medication, please contact your pharmacy.  They will contact our office to request authorization. Prescriptions will not be filled after 5 pm or on week-ends. 4. Avoid getting constipated.  Between the surgery and the pain medications, it is common to experience some constipation.  Increasing fluid intake and taking a fiber supplement (such as Metamucil, Citrucel, FiberCon, MiraLax, etc) 1-2 times a day regularly will usually help prevent this problem from occurring.  A mild laxative (prune juice, Milk of Magnesia, MiraLax, etc) should be taken according to package directions if there are no bowel movements after 48 hours.   5. Wash / shower every day.  You may shower over the skin glue which is waterproof.  Continue to shower over incision(s) after the dressing is off. 6. RSkin glue will flake  off after 1-2 weeks.  You may leave the incision open to air.  You may have skin tapes (Steri Strips) covering the incision(s).  Leave them on until one week, then remove.  You may replace a dressing/Band-Aid to cover the incision for comfort if you wish.    7. ACTIVITIES as tolerated:   a. You may resume regular (light) daily  activities beginning the next day--such as daily self-care, walking, climbing stairs--gradually increasing activities as tolerated.  If you can walk 30 minutes without difficulty, it is safe to try more intense activity such as jogging, treadmill, bicycling, low-impact aerobics, swimming, etc. b. Save the most intensive and strenuous activity for last such as sit-ups, heavy lifting, contact sports, etc  Refrain from any heavy lifting or straining until you are off narcotics for pain control.   c. DO NOT PUSH THROUGH PAIN.  Let pain be your guide: If it hurts to do something, don't do it.  Pain is your body warning you to avoid that activity for another week until the pain goes down. d. You may drive when you are no longer taking prescription pain medication, you can comfortably wear a seatbelt, and you can safely maneuver your car and apply brakes. e. Dennis Bast may have sexual intercourse when it is comfortable.  8. FOLLOW UP in our office a. Please call [facility] at [phone] to set up an appointment to see your surgeon in the office for a follow-up appointment approximately 2-3 weeks after your surgery. b. Make sure that you call for this appointment the day you arrive home to insure a convenient appointment time. 9. IF YOU HAVE DISABILITY OR FAMILY LEAVE FORMS, BRING THEM TO THE OFFICE FOR PROCESSING.  DO NOT GIVE THEM TO YOUR DOCTOR.   WHEN TO CALL us [phone]: 1. Poor pain control 2. Reactions / problems with new medications (rash/itching, nausea, etc)  3. Fever over 101.5 F (38.5 C) 4. Worsening swelling or bruising 5. Continued bleeding from incision. 6. Increased pain, redness, or drainage from the incision 7. Difficulty breathing / swallowing   The clinic staff is available to answer your questions during regular business hours (8:30am-5pm).  Please dont hesitate to call and ask to speak to one of our nurses for clinical concerns.   If you have a medical emergency, go to the nearest  emergency room or call 911.  A surgeon from [facility] is always on call at the [facility], [place], [place], [place], [place]  [id number] ? MAIN: [phone] ? TOLL FREE: [phone] ?  FAX [phone] [email or web address]

## 2018-06-08 NOTE — Anesthesia Postprocedure Evaluation (Signed)
Anesthesia Post Note  Patient: [name]  Procedure(s) Performed: RIGHT INGUINAL LYMPH NODE EXCISIONAL BIOPSY (Right )     Patient location during evaluation: PACU Anesthesia Type: General Level of consciousness: awake and alert Pain management: pain level controlled Vital Signs Assessment: post-procedure vital signs reviewed and stable Respiratory status: spontaneous breathing, nonlabored ventilation, respiratory function stable and patient connected to nasal cannula oxygen Cardiovascular status: blood pressure returned to baseline and stable Postop Assessment: no apparent nausea or vomiting Anesthetic complications: no    Last Vitals:  Vitals:   06/08/18 0930 06/08/18 0945  BP: 133/73 (!) 154/67  Pulse: 79 81  Resp: 14 14  Temp: (!) 36.3 C   SpO2: 99% 97%    Last Pain:  Vitals:   06/08/18 0945  TempSrc:   PainSc: 9                  Barnet Glasgow

## 2018-06-08 NOTE — Transfer of Care (Signed)
Immediate Anesthesia Transfer of Care Note  Patient: [name]  Procedure(s) Performed: RIGHT INGUINAL LYMPH NODE EXCISIONAL BIOPSY (Right )  Patient Location: PACU  Anesthesia Type:General  Level of Consciousness: awake and alert   Airway & Oxygen Therapy: Patient Spontanous Breathing and Patient connected to face mask oxygen  Post-op Assessment: Report given to RN and Post -op Vital signs reviewed and stable  Post vital signs: Reviewed and stable  Last Vitals:  Vitals Value Taken Time  BP 149/62 06/08/2018  8:37 AM  Temp    Pulse 85 06/08/2018  8:41 AM  Resp 21 06/08/2018  8:41 AM  SpO2 100 % 06/08/2018  8:41 AM  Vitals shown include unvalidated device data.  Last Pain:  Vitals:   06/08/18 0609  TempSrc:   PainSc: 4       Patients Stated Pain Goal: 4 (76/81/15 7262)  Complications: No apparent anesthesia complications

## 2018-06-08 NOTE — Op Note (Signed)
Operative Note  RAVENNA LEGORE  [id number]  [id number]  06/08/2018   Surgeon: Vikki Ports A ConnorMD  Assistant: none  Procedure performed: excisional biopsy superficial right inguinal lymphadenopathy  Preop diagnosis: lymphadenopathy Post-op diagnosis/intraop findings: same  Specimens: right inguinal lymph nodes Retained items: no EBL: minimal cc Complications: none  Description of procedure: After obtaining informed consent the patient was taken to the operating room and placed supine on operating room table wheregeneral LMA anesthesia was initiated, preoperative antibiotics were administered, SCDs applied, and a formal timeout was performed. The right groin was prepped and draped in the usual fashion. After infiltration with 0.5% marcaine witrh epinephrine a 3cm incision was made over the region of lymphadenopathy and the soft tissues carefully dissected with cautery until the lymph nodes were identified. There were at least three enlarged but soft nodes mildly matted together in the field. Cautery and blunt dissection were used to excise two nodes and 3-0 vicryl ties used to ligate the larger lymphatics. Another node inferior to the two being excised was partially transected with cautery in order to free the specimen, which was sent fresh for pathology to evaluate for lymphoma. Hemostasis was ensured in the field which was then irrigated. The scarpas layer was closed with interrupted 3-0 vicryls. Skin was closed with interupted deep dermal 3-0 vicryls and running subcuticular 4-0 monocryl. Dermabond was applied. The patient was then awakened, extubated and taken to PACU in stable condition.   All counts were correct at the completion of the case.

## 2018-06-08 NOTE — Interval H&P Note (Signed)
History and Physical Interval Note:  06/08/2018 7:02 AM  [name]  has presented today for surgery, with the diagnosis of LYMPHADENOPATHY  The various methods of treatment have been discussed with the patient and family. After consideration of risks, benefits and other options for treatment, the patient has consented to  Procedure(s): RIGHT INGUINAL LYMPH NODE EXCISIONAL BIOPSY (Right) as a surgical intervention .  The patient's history has been reviewed, patient examined, no change in status, stable for surgery.  I have reviewed the patient's chart and labs.  Questions were answered to the patient's satisfaction.     [name]

## 2018-06-09 ENCOUNTER — Encounter (HOSPITAL_COMMUNITY): Payer: Self-pay | Admitting: Surgery

## 2018-06-22 NOTE — Progress Notes (Signed)
HEMATOLOGY/ONCOLOGY CONSULTATION NOTE  Date of Service: 06/23/2018  Patient Care Team: Lanice Shirts, MD as PCP - General (Internal Medicine)  CHIEF COMPLAINTS/PURPOSE OF CONSULTATION:  Lymphocytosis  HISTORY OF PRESENTING ILLNESS:   [name] is a wonderful 62 y.o. female who has been referred to [place] by Dr. Emi Belfast for evaluation and management of Lymphocytosis and Lymphadenopathy. The pt reports that she is doing well overall.   The pt reports that she recently presented to her PCP Dr.Schoenhoff for bilateral inguinal masses that she reports first appeared a year ago. She notes that these masses have gotten bigger in the last six months, and they occasionally hurt while pushing or pulling. She has thus far been evaluated with CT Neck, Abdomen and Pelvis, as well as an inguinal biopsy that was benign, as noted below. She was further evaluated with a Thyroid US for a CT-observed nodule, and is awaiting a biopsy of this nodule.   The pt notes that she has had relatively less energy recently. She denies recent fevers and night sweats but notes that she has had more chills than she used to in the past 18 months. She adds that she has lost about 20 pounds in the last year, accompanied by dietary and lifestyle changes. Her appetite has been a little weaker and she has seen a nutritionist recently. She has had a recent mammogram which was unremarkable.   The pt has several cats at home but denies being scratched recently.   The pt had cervical biopsy in 1977 while pregnant, she then had a partial hysterectomy after delivery, and did not have chemotherapy or radiation therapy. She has not had concerns since then. About 10-15 years later, the pt developed ovarian cysts and subsequently had her ovaries removed as well.   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 She dealt with GI symptoms for two years before she  had exploratory surgery.  Of note prior to the patient's visit today, pt has had CT N/A/P completed on 04/02/18 with results revealing No pathologically enlarged cervical lymph nodes are seen. 11 mm partially calcified LEFT lobe thyroid nodule. Interval mild enlargement of right inguinal lymph nodes as detailed above. Some of these nodes maintain a benign fatty hilum. Findings may be reactive in etiology. No acute findings noted within the abdomen or pelvis. Status post splenectomy and distal pancreatectomy.   Most recent lab results (04/30/18) of CBC is as follows: all values are WNL except for WBC at 11.1k, MCH at 26.0, MCHC at 31.6, MPV at 11.1, Lymph abs at 4.9k.  On review of systems, pt reports expected weight loss, occasional chills, slow-growing b/l inguinal masses, and denies abdominal pains, vaginal boils, coughs, colds, flu-like symptoms, skin rashes, and any other symptoms.   On PMHx the pt reports Cervical cancer in 1977. On Family Hx the pt reports uncle with leukemia, matneral grandmother and mother with breast cancer, paternal grandmother with ovarian cancer, uterine cancer, dad with MI, maternal aunt with cancer.   Interval History:   [name] returns today for management and evaluation of her lymphocytosis. The patient's last visit with [place] was on 05/26/18. She is accompanied today by her husband. The pt reports that she is doing well overall.   The pt reports that she has not developed any new concerns in the interim. She notes that she tolerated her excisional biopsy well, as noted below.   Of note since the  patient's last visit, pt has had a Right Inguinal LN biopsy completed on 06/08/18 with results revealing Benign lymph node with focal dermatopathic change and fibrosis.  Lab results today (06/23/18) of CBC w/diff, CMP, and Reticulocytes is as follows: all values are WNL except for RDW at 15.6k, Lymphs abs at 3.5k, Basophils abs at 200, Glucose at 125, Alk Phos at  138. 06/23/18 LDH is WNL at 187  On review of systems, pt reports feeling cold frequently, good energy levels, and denies fevers, night sweats, and any other symptoms.    MEDICAL HISTORY:  Past Medical History:  Diagnosis Date  . Cancer ([place]) 1977   CERVICAL  . GERD (gastroesophageal reflux disease) 11/14/2013  . Gout   . Mixed hyperlipidemia 11/14/2013    SURGICAL HISTORY: Past Surgical History:  Procedure Laterality Date  . 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 CHOLECYSTECTOMY    . EYE SURGERY     6/3 LASER FOR GLAUCOMA, 9/03 CE/IOL IMPLANTS  . INGUINAL LYMPH NODE BIOPSY Right 06/08/2018   Procedure: RIGHT INGUINAL LYMPH NODE EXCISIONAL BIOPSY;  Surgeon: Clovis Riley, MD;  Location: [facility];  Service: General;  Laterality: Right;  . pancreatectomy    . SHOULDER CLOSED REDUCTION Left 02/03/2013   Procedure: CLOSED MANIPULATION SHOULDER;  Surgeon: Vickey Huger, MD;  Location: [facility];  Service: Orthopedics;  Laterality: Left;  CLOSED MANIPULATION LEFT SHOULDER  . SPLENECTOMY    . TONSILLECTOMY  1970    SOCIAL HISTORY: Social History   Socioeconomic History  . Marital status: Divorced    Spouse name: Not on file  . Number of children: Not on file  . Years of education: Not on file  . Highest education level: Not on file  Occupational History  . Not on file  Social Needs  . Financial resource strain: Not on file  . Food insecurity:    Worry: Not on file    Inability: Not on file  . Transportation needs:    Medical: Not on file    Non-medical: Not on file  Tobacco Use  . Smoking status: Former Smoker    Last attempt to quit: 09/23/1989    Years since quitting: 28.7  . Smokeless tobacco: Never Used  Substance and Sexual Activity  . Alcohol use: No    Alcohol/week: 0.0 standard drinks  . Drug use: No    Comment: quit>20years ago  . Sexual activity: Not on file  Lifestyle  . Physical activity:    Days per week: Not on file    Minutes per session: Not on  file  . Stress: Not on file  Relationships  . Social connections:    Talks on phone: Not on file    Gets together: Not on file    Attends religious service: Not on file    Active member of club or organization: Not on file    Attends meetings of clubs or organizations: Not on file    Relationship status: Not on file  . Intimate partner violence:    Fear of current or ex partner: Not on file    Emotionally abused: Not on file    Physically abused: Not on file    Forced sexual activity: Not on file  Other Topics Concern  . Not on file  Social History Narrative  . Not on file    FAMILY HISTORY: Family History  Problem Relation Age of Onset  . Dementia Mother   . Heart disease Mother   .  Hypertension Mother   . COPD Mother   . Breast cancer Mother 38  . Heart disease Father   . Diabetes Father   . Glaucoma Father   . Cancer Father        BLADDER  . Depression Sister     ALLERGIES:  is allergic to gabapentin; penicillins; and pepcid [famotidine].  MEDICATIONS:  Current Outpatient Medications  Medication Sig Dispense Refill  . acetaminophen (TYLENOL) 500 MG tablet Take 500 mg by mouth at bedtime.     Marland Kitchen aspirin EC 81 MG tablet Take 81 mg by mouth at bedtime.     . Blood Glucose Monitoring Suppl (ONETOUCH VERIO) w/Device KIT 1 kit by Does not apply route 3 (three) times daily. 1 kit 0  . Cholecalciferol (VITAMIN D-3) 5000 units TABS Take 5,000 tablets by mouth every evening.    . clonazePAM (KLONOPIN) 1 MG tablet Take 0.5 mg by mouth daily as needed for anxiety.  1  . docusate sodium (COLACE) 100 MG capsule Take 1 capsule (100 mg total) by mouth 2 (two) times daily. 60 capsule 0  . fexofenadine (ALLEGRA) 180 MG tablet Take 180 mg by mouth daily as needed for allergies.     Marland Kitchen glucose blood (ONETOUCH VERIO) test strip CHECK BLOOD SUGAR 3 TIMES DAILY-DX-E11.9 AND Z79.4. 300 each 1  . insulin NPH-regular Human (NOVOLIN 70/30) (70-30) 100 UNIT/ML injection Inject 10-12 Units into  the skin See admin instructions. Take 12 units in the morning, and 10 units at night    . losartan (COZAAR) 25 MG tablet Take 12.5 mg by mouth daily as needed. If blood pressure is >140  0  . ONETOUCH DELICA LANCETS 63J MISC Check blood sugar 3 times daily-DX-E11.9 and Z79.4. 300 each 1  . pregabalin (LYRICA) 75 MG capsule Take 1 capsule (75 mg total) by mouth 2 (two) times daily. 60 capsule 0  . Propylene Glycol (SYSTANE COMPLETE) 0.6 % SOLN Place 1 drop into both eyes 2 (two) times daily as needed (for dry eyes).    . traMADol (ULTRAM) 50 MG tablet Take 1 tablet (50 mg total) by mouth every 6 (six) hours as needed. 20 tablet 0   No current facility-administered medications for this visit.     REVIEW OF SYSTEMS:    A 10+ POINT REVIEW OF SYSTEMS WAS OBTAINED including neurology, dermatology, psychiatry, cardiac, respiratory, lymph, extremities, GI, GU, Musculoskeletal, constitutional, breasts, reproductive, HEENT.  All pertinent positives are noted in the HPI.  All others are negative.   PHYSICAL EXAMINATION: ECOG PERFORMANCE STATUS: 1 - Symptomatic but completely ambulatory  Vitals:   06/23/18 1445  BP: 139/72  Pulse: 74  Temp: 98.3 F (36.8 C)  SpO2: 100%   Filed Weights   06/23/18 1445  Weight: 131 lb 3.2 oz (59.5 kg)   .Body mass index is 25.62 kg/m.  GENERAL:alert, in no acute distress and comfortable SKIN: no acute rashes, no significant lesions EYES: conjunctiva are pink and non-injected, sclera anicteric OROPHARYNX: MMM, no exudates, no oropharyngeal erythema or ulceration NECK: supple, no JVD LYMPH:  Palpable inguinal lymph nodes about 2cm that are tender to palpation, no palpable lymphadenopathy in the cervical or axillary regions LUNGS: clear to auscultation b/l with normal respiratory effort HEART: regular rate & rhythm ABDOMEN:  normoactive bowel sounds , non tender, not distended. No palpable hepatosplenomegaly.  Extremity: no pedal edema PSYCH: alert &  oriented x 3 with fluent speech NEURO: no focal motor/sensory deficits   LABORATORY DATA:  I have reviewed  the data as listed  . CBC Latest Ref Rng & Units 06/23/2018 06/04/2018 05/04/2018  WBC 3.9 - 10.3 K/uL 9.3 9.8 8.5  Hemoglobin 11.6 - 15.9 g/dL 12.9 12.6 12.8  Hematocrit 34.8 - 46.6 % 40.7 40.0 41.4  Platelets 145 - 400 K/uL 288 341 263    . CMP Latest Ref Rng & Units 06/23/2018 06/04/2018 05/04/2018  Glucose 70 - 99 mg/dL 125(H) 115(H) 219(H)  BUN 8 - 23 mg/dL 19 25(H) 16  Creatinine 0.44 - 1.00 mg/dL 0.88 0.83 0.93  Sodium 135 - 145 mmol/L 143 144 140  Potassium 3.5 - 5.1 mmol/L 4.4 4.6 4.1  Chloride 98 - 111 mmol/L 103 106 102  CO2 22 - 32 mmol/L 32 30 28  Calcium 8.9 - 10.3 mg/dL 10.2 10.0 9.6  Total Protein 6.5 - 8.1 g/dL 7.7 - 7.8  Total Bilirubin 0.3 - 1.2 mg/dL 0.4 - 0.4  Alkaline Phos 38 - 126 U/L 138(H) - 118  AST 15 - 41 U/L 17 - 23  ALT 0 - 44 U/L 19 - 18   06/08/18 Surgical Pathology:      Component     Latest Ref Rng & Units 05/04/2018  LDH     98 - 192 U/L 226 (H)  Sed Rate     0 - 22 mm/hr 5    04/30/18 CBC w/diff:    04/15/18 Inguinal LN Needle/Core Biopsy:   RADIOGRAPHIC STUDIES: I have personally reviewed the radiological images as listed and agreed with the findings in the report. No results found.  ASSESSMENT & PLAN:  62 y.o. female with  1. Lymphocytosis -Patient's labs on presentation from 04/30/18, no anemia with HGB at 12.5, no thrombocytosis with PLT at 245k, some lymphocytosis with Lymphs abs at 4.9k -Pt's lymphocytosis have been stable over the last three years and her occasional thrombocytosis is in the context of a splenectomy ? Related to splenectomy  04/02/18 CT Neck revealed No pathologically enlarged cervical lymph nodes are seen. 11 mm partially calcified LEFT lobe thyroid nodule, recommend sonography for further characterization.    04/02/18 CT A/P revealed Interval mild enlargement of right inguinal lymph nodes as detailed  above. Some of these nodes maintain a benign fatty hilum. Findings may be reactive in etiology. No acute findings noted within the abdomen or pelvis. Status post splenectomy and distal pancreatectomy   04/15/18 Right Inguinal needle/core biopsy revealed benign lymph nodal tissue without evidence of malignancy   PLAN:  -Discussed the 05/04/18 Peripheral flow cytometry was unremarkable for a clonal lymphoproliferative process. -Discussed pt labwork today, 06/23/18; blood chemistries are stable, lymphocytes decreased to 3.5k -Discussed the 06/08/18 Right inguinal lymph node biopsy which revealed a Benign lymph node with focal dermatopathic change and fibrosis -Discussed the peripheral blood flow cytometry which did not reveal a clonal population of lymphocytes -Suspect that the pt's splenectomy is the cause of her previous lymphocytosis  -Continue age appropriate cancer screening with PCP  -Recommended that the pt keep up to date with her post-splenectomy vaccinations with PCP  RTC with Dr Irene Limbo as needed   All of the patients questions were answered with apparent satisfaction. The patient knows to call the clinic with any problems, questions or concerns.  The total time spent in the appt was 20 minutes and more than 50% was on counseling and direct patient cares.     Sullivan Lone MD [place] AAHIVMS [facility] Hematology/Oncology Physician [facility]  (Office):       [phone] (  Work cell):  [phone] (Fax):           [phone]  06/23/2018 3:31 PM  I, Baldwin Jamaica, am acting as a scribe for Dr. Irene Limbo  .I have reviewed the above documentation for accuracy and completeness, and I agree with the above. Brunetta Genera MD

## 2018-06-23 ENCOUNTER — Inpatient Hospital Stay: Payer: BC Managed Care – PPO

## 2018-06-23 ENCOUNTER — Inpatient Hospital Stay: Payer: BC Managed Care – PPO | Attending: Hematology | Admitting: Hematology

## 2018-06-23 ENCOUNTER — Encounter: Payer: Self-pay | Admitting: Hematology

## 2018-06-23 VITALS — BP 139/72 | HR 74 | Temp 98.3°F | Ht 60.0 in | Wt 131.2 lb

## 2018-06-23 DIAGNOSIS — D72820 Lymphocytosis (symptomatic): Secondary | ICD-10-CM | POA: Insufficient documentation

## 2018-06-23 DIAGNOSIS — Z87891 Personal history of nicotine dependence: Secondary | ICD-10-CM | POA: Diagnosis not present

## 2018-06-23 DIAGNOSIS — R590 Localized enlarged lymph nodes: Secondary | ICD-10-CM | POA: Diagnosis not present

## 2018-06-23 LAB — CBC WITH DIFFERENTIAL/PLATELET
BASOS ABS: 0.2 10*3/uL — AB (ref 0.0–0.1)
Basophils Relative: 2 %
EOS PCT: 3 %
Eosinophils Absolute: 0.3 10*3/uL (ref 0.0–0.5)
HEMATOCRIT: 40.7 % (ref 34.8–46.6)
Hemoglobin: 12.9 g/dL (ref 11.6–15.9)
LYMPHS PCT: 38 %
Lymphs Abs: 3.5 10*3/uL — ABNORMAL HIGH (ref 0.9–3.3)
MCH: 26 pg (ref 25.1–34.0)
MCHC: 31.7 g/dL (ref 31.5–36.0)
MCV: 82.1 fL (ref 79.5–101.0)
MONO ABS: 0.5 10*3/uL (ref 0.1–0.9)
Monocytes Relative: 5 %
NEUTROS ABS: 4.9 10*3/uL (ref 1.5–6.5)
Neutrophils Relative %: 52 %
PLATELETS: 288 10*3/uL (ref 145–400)
RBC: 4.96 MIL/uL (ref 3.70–5.45)
RDW: 15.6 % — AB (ref 11.2–14.5)
WBC: 9.3 10*3/uL (ref 3.9–10.3)

## 2018-06-23 LAB — LACTATE DEHYDROGENASE: LDH: 187 U/L (ref 98–192)

## 2018-06-23 LAB — CMP (CANCER CENTER ONLY)
ALT: 19 U/L (ref 0–44)
ANION GAP: 8 (ref 5–15)
AST: 17 U/L (ref 15–41)
Albumin: 4 g/dL (ref 3.5–5.0)
Alkaline Phosphatase: 138 U/L — ABNORMAL HIGH (ref 38–126)
BUN: 19 mg/dL (ref 8–23)
CHLORIDE: 103 mmol/L (ref 98–111)
CO2: 32 mmol/L (ref 22–32)
Calcium: 10.2 mg/dL (ref 8.9–10.3)
Creatinine: 0.88 mg/dL (ref 0.44–1.00)
Glucose, Bld: 125 mg/dL — ABNORMAL HIGH (ref 70–99)
POTASSIUM: 4.4 mmol/L (ref 3.5–5.1)
Sodium: 143 mmol/L (ref 135–145)
Total Bilirubin: 0.4 mg/dL (ref 0.3–1.2)
Total Protein: 7.7 g/dL (ref 6.5–8.1)

## 2018-06-24 ENCOUNTER — Telehealth: Payer: Self-pay

## 2018-06-24 NOTE — Telephone Encounter (Signed)
Per 10/1 no los

## 2018-08-14 ENCOUNTER — Encounter (INDEPENDENT_AMBULATORY_CARE_PROVIDER_SITE_OTHER): Payer: BC Managed Care – PPO | Admitting: Ophthalmology

## 2018-08-14 DIAGNOSIS — H35033 Hypertensive retinopathy, bilateral: Secondary | ICD-10-CM

## 2018-08-14 DIAGNOSIS — I10 Essential (primary) hypertension: Secondary | ICD-10-CM | POA: Diagnosis not present

## 2018-08-14 DIAGNOSIS — E113391 Type 2 diabetes mellitus with moderate nonproliferative diabetic retinopathy without macular edema, right eye: Secondary | ICD-10-CM | POA: Diagnosis not present

## 2018-08-14 DIAGNOSIS — E113312 Type 2 diabetes mellitus with moderate nonproliferative diabetic retinopathy with macular edema, left eye: Secondary | ICD-10-CM

## 2018-08-14 DIAGNOSIS — E11311 Type 2 diabetes mellitus with unspecified diabetic retinopathy with macular edema: Secondary | ICD-10-CM

## 2018-08-14 DIAGNOSIS — H43813 Vitreous degeneration, bilateral: Secondary | ICD-10-CM

## 2018-08-19 ENCOUNTER — Telehealth: Payer: Self-pay | Admitting: Podiatry

## 2018-08-19 NOTE — Telephone Encounter (Signed)
Pt called requesting refill on Lyrica.

## 2018-08-19 NOTE — Telephone Encounter (Signed)
Left message informing pt Dr. Jacqualyn Posey had okayed a refill, but she would need to be evaluated in office prior to future refills and to [facility], [facility] and her rx would not be filled.

## 2018-08-21 ENCOUNTER — Other Ambulatory Visit: Payer: Self-pay | Admitting: Podiatry

## 2018-08-24 ENCOUNTER — Telehealth: Payer: Self-pay | Admitting: Podiatry

## 2018-08-24 MED ORDER — PREGABALIN 75 MG PO CAPS: 75.0000 mg | ORAL_CAPSULE | Freq: Two times a day (BID) | ORAL | 0 refills | Status: DC

## 2018-08-24 NOTE — Addendum Note (Signed)
Addended by: Harriett Sine D on: 08/24/2018 12:14 PM   Modules accepted: Orders

## 2018-08-24 NOTE — Telephone Encounter (Signed)
Faxed to [facility].

## 2018-08-24 NOTE — Telephone Encounter (Signed)
Pt called requesting that her prescription of Lyrica be sent to [facility] on [address]. Pharmacy # [phone]

## 2018-08-24 NOTE — Telephone Encounter (Signed)
left message informing pt she needed an appt and we would refill one more time Dr Jacqualyn Posey had requested she follow up after her last visit.

## 2018-08-28 ENCOUNTER — Telehealth: Payer: Self-pay | Admitting: Podiatry

## 2018-08-28 MED ORDER — PREGABALIN 75 MG PO CAPS: 75.0000 mg | ORAL_CAPSULE | Freq: Two times a day (BID) | ORAL | 0 refills | Status: DC

## 2018-08-28 NOTE — Telephone Encounter (Signed)
"   I have been trying to have my Lyrica refilled and the Pharmacy said that the RX is not Legible. They said that they can take a verbal order." My foot is in a lot of pain, could you please call the [facility] on [address]." Tylenol is not helping, because im in a lot of pai."

## 2018-08-28 NOTE — Addendum Note (Signed)
Addended by: Harriett Sine D on: 08/28/2018 11:38 AM   Modules accepted: Orders

## 2018-08-28 NOTE — Telephone Encounter (Signed)
Orders called to [facility]

## 2018-09-22 DIAGNOSIS — M25561 Pain in right knee: Secondary | ICD-10-CM | POA: Insufficient documentation

## 2018-10-27 ENCOUNTER — Other Ambulatory Visit: Payer: Self-pay | Admitting: Gastroenterology

## 2018-10-27 DIAGNOSIS — R935 Abnormal findings on diagnostic imaging of other abdominal regions, including retroperitoneum: Secondary | ICD-10-CM

## 2018-10-30 ENCOUNTER — Encounter (INDEPENDENT_AMBULATORY_CARE_PROVIDER_SITE_OTHER): Payer: Medicare Other | Admitting: Ophthalmology

## 2018-10-30 DIAGNOSIS — H43813 Vitreous degeneration, bilateral: Secondary | ICD-10-CM | POA: Diagnosis not present

## 2018-10-30 DIAGNOSIS — E113391 Type 2 diabetes mellitus with moderate nonproliferative diabetic retinopathy without macular edema, right eye: Secondary | ICD-10-CM

## 2018-10-30 DIAGNOSIS — E11311 Type 2 diabetes mellitus with unspecified diabetic retinopathy with macular edema: Secondary | ICD-10-CM | POA: Diagnosis not present

## 2018-10-30 DIAGNOSIS — E113312 Type 2 diabetes mellitus with moderate nonproliferative diabetic retinopathy with macular edema, left eye: Secondary | ICD-10-CM

## 2018-11-02 ENCOUNTER — Ambulatory Visit
Admission: RE | Admit: 2018-11-02 | Discharge: 2018-11-02 | Disposition: A | Discharge status: Home / Self Care | Payer: Medicare Other | Source: Ambulatory Visit | Attending: Gastroenterology | Admitting: Gastroenterology

## 2018-11-02 DIAGNOSIS — R935 Abnormal findings on diagnostic imaging of other abdominal regions, including retroperitoneum: Secondary | ICD-10-CM

## 2018-11-02 MED ORDER — IOPAMIDOL (ISOVUE-300) INJECTION 61%
100.0000 mL | Freq: Once | INTRAVENOUS | Status: AC | PRN
  Administered 2018-11-02: 100 mL via INTRAVENOUS

## 2018-11-27 ENCOUNTER — Other Ambulatory Visit: Payer: Self-pay | Admitting: Podiatry

## 2018-11-27 ENCOUNTER — Telehealth: Payer: Self-pay | Admitting: Podiatry

## 2018-11-27 MED ORDER — PREGABALIN 75 MG PO CAPS: 75.0000 mg | ORAL_CAPSULE | Freq: Two times a day (BID) | ORAL | 0 refills | Status: DC

## 2018-11-27 NOTE — Telephone Encounter (Signed)
Patient called requesting refill of Lyrica. Please send to [facility] on [address] in [place].

## 2018-11-27 NOTE — Telephone Encounter (Signed)
I sent to her pharmacy but she needs to come in for her yearly check before any further refills.

## 2018-11-30 NOTE — Telephone Encounter (Signed)
Left message informing pt of Dr. Wagoner's orders. 

## 2018-12-31 ENCOUNTER — Other Ambulatory Visit: Payer: Self-pay | Admitting: Podiatry

## 2018-12-31 NOTE — Telephone Encounter (Signed)
[id number]

## 2019-01-15 ENCOUNTER — Other Ambulatory Visit: Payer: Self-pay

## 2019-01-15 ENCOUNTER — Encounter (INDEPENDENT_AMBULATORY_CARE_PROVIDER_SITE_OTHER): Payer: Medicare Other | Admitting: Ophthalmology

## 2019-01-15 DIAGNOSIS — E113391 Type 2 diabetes mellitus with moderate nonproliferative diabetic retinopathy without macular edema, right eye: Secondary | ICD-10-CM | POA: Diagnosis not present

## 2019-01-15 DIAGNOSIS — E11311 Type 2 diabetes mellitus with unspecified diabetic retinopathy with macular edema: Secondary | ICD-10-CM | POA: Diagnosis not present

## 2019-01-15 DIAGNOSIS — H43813 Vitreous degeneration, bilateral: Secondary | ICD-10-CM

## 2019-01-15 DIAGNOSIS — E113312 Type 2 diabetes mellitus with moderate nonproliferative diabetic retinopathy with macular edema, left eye: Secondary | ICD-10-CM | POA: Diagnosis not present

## 2019-04-02 ENCOUNTER — Encounter (INDEPENDENT_AMBULATORY_CARE_PROVIDER_SITE_OTHER): Payer: Medicare Other | Admitting: Ophthalmology

## 2019-04-02 ENCOUNTER — Other Ambulatory Visit: Payer: Self-pay

## 2019-04-02 DIAGNOSIS — H43813 Vitreous degeneration, bilateral: Secondary | ICD-10-CM

## 2019-04-02 DIAGNOSIS — E113313 Type 2 diabetes mellitus with moderate nonproliferative diabetic retinopathy with macular edema, bilateral: Secondary | ICD-10-CM

## 2019-04-02 DIAGNOSIS — E11311 Type 2 diabetes mellitus with unspecified diabetic retinopathy with macular edema: Secondary | ICD-10-CM | POA: Diagnosis not present

## 2019-04-20 ENCOUNTER — Other Ambulatory Visit: Payer: Self-pay | Admitting: Internal Medicine

## 2019-04-20 DIAGNOSIS — Z1231 Encounter for screening mammogram for malignant neoplasm of breast: Secondary | ICD-10-CM

## 2019-05-03 ENCOUNTER — Other Ambulatory Visit: Payer: Self-pay | Admitting: Internal Medicine

## 2019-05-03 DIAGNOSIS — E041 Nontoxic single thyroid nodule: Secondary | ICD-10-CM

## 2019-05-13 ENCOUNTER — Other Ambulatory Visit: Payer: PRIVATE HEALTH INSURANCE

## 2019-05-15 ENCOUNTER — Other Ambulatory Visit: Payer: Self-pay

## 2019-05-15 ENCOUNTER — Observation Stay (HOSPITAL_COMMUNITY)
Admission: EM | Admit: 2019-05-15 | Discharge: 2019-05-17 | Disposition: A | Discharge status: Home / Self Care | Payer: Medicare Other | Attending: Emergency Medicine | Admitting: Emergency Medicine

## 2019-05-15 ENCOUNTER — Observation Stay (HOSPITAL_COMMUNITY): Payer: Medicare Other

## 2019-05-15 DIAGNOSIS — Z87891 Personal history of nicotine dependence: Secondary | ICD-10-CM | POA: Insufficient documentation

## 2019-05-15 DIAGNOSIS — I4891 Unspecified atrial fibrillation: Secondary | ICD-10-CM | POA: Insufficient documentation

## 2019-05-15 DIAGNOSIS — Z20828 Contact with and (suspected) exposure to other viral communicable diseases: Secondary | ICD-10-CM | POA: Diagnosis not present

## 2019-05-15 DIAGNOSIS — E785 Hyperlipidemia, unspecified: Secondary | ICD-10-CM | POA: Insufficient documentation

## 2019-05-15 DIAGNOSIS — R4182 Altered mental status, unspecified: Secondary | ICD-10-CM | POA: Diagnosis present

## 2019-05-15 DIAGNOSIS — Z7982 Long term (current) use of aspirin: Secondary | ICD-10-CM | POA: Insufficient documentation

## 2019-05-15 DIAGNOSIS — I10 Essential (primary) hypertension: Secondary | ICD-10-CM | POA: Diagnosis not present

## 2019-05-15 DIAGNOSIS — R55 Syncope and collapse: Principal | ICD-10-CM | POA: Diagnosis present

## 2019-05-15 DIAGNOSIS — E119 Type 2 diabetes mellitus without complications: Secondary | ICD-10-CM | POA: Insufficient documentation

## 2019-05-15 DIAGNOSIS — Z794 Long term (current) use of insulin: Secondary | ICD-10-CM | POA: Insufficient documentation

## 2019-05-15 LAB — CBG MONITORING, ED
Glucose-Capillary: 71 mg/dL (ref 70–99)
Glucose-Capillary: 62 mg/dL — ABNORMAL LOW (ref 70–99)
Glucose-Capillary: 174 mg/dL — ABNORMAL HIGH (ref 70–99)
Glucose-Capillary: 65 mg/dL — ABNORMAL LOW (ref 70–99)
Glucose-Capillary: 249 mg/dL — ABNORMAL HIGH (ref 70–99)
Glucose-Capillary: 279 mg/dL — ABNORMAL HIGH (ref 70–99)

## 2019-05-15 LAB — URINALYSIS, ROUTINE W REFLEX MICROSCOPIC
Bilirubin Urine: NEGATIVE
Glucose, UA: 50 mg/dL — AB
Ketones, ur: NEGATIVE mg/dL
Leukocytes,Ua: NEGATIVE
Nitrite: NEGATIVE
Protein, ur: 30 mg/dL — AB
Specific Gravity, Urine: 1.003 — ABNORMAL LOW (ref 1.005–1.030)
pH: 8 (ref 5.0–8.0)

## 2019-05-15 LAB — CBC
HCT: 45.3 % (ref 36.0–46.0)
Hemoglobin: 14.1 g/dL (ref 12.0–15.0)
MCH: 26 pg (ref 26.0–34.0)
MCHC: 31.1 g/dL (ref 30.0–36.0)
MCV: 83.6 fL (ref 80.0–100.0)
Platelets: 302 10*3/uL (ref 150–400)
RBC: 5.42 MIL/uL — ABNORMAL HIGH (ref 3.87–5.11)
RDW: 15.8 % — ABNORMAL HIGH (ref 11.5–15.5)
WBC: 12 10*3/uL — ABNORMAL HIGH (ref 4.0–10.5)
nRBC: 0 % (ref 0.0–0.2)

## 2019-05-15 LAB — COMPREHENSIVE METABOLIC PANEL
ALT: 20 U/L (ref 0–44)
AST: 31 U/L (ref 15–41)
Albumin: 3.8 g/dL (ref 3.5–5.0)
Alkaline Phosphatase: 157 U/L — ABNORMAL HIGH (ref 38–126)
Anion gap: 14 (ref 5–15)
BUN: 13 mg/dL (ref 8–23)
CO2: 27 mmol/L (ref 22–32)
Calcium: 10 mg/dL (ref 8.9–10.3)
Chloride: 98 mmol/L (ref 98–111)
Creatinine, Ser: 0.82 mg/dL (ref 0.44–1.00)
GFR calc Af Amer: >60 mL/min (ref >60)
GFR calc non Af Amer: >60 mL/min (ref >60)
Glucose, Bld: 85 mg/dL (ref 70–99)
Potassium: 3.7 mmol/L (ref 3.5–5.1)
Sodium: 139 mmol/L (ref 135–145)
Total Bilirubin: 1 mg/dL (ref 0.3–1.2)
Total Protein: 7.2 g/dL (ref 6.5–8.1)

## 2019-05-15 LAB — T4, FREE: Free T4: 1.07 ng/dL (ref 0.61–1.12)

## 2019-05-15 LAB — GLUCOSE, CAPILLARY: Glucose-Capillary: 102 mg/dL — ABNORMAL HIGH (ref 70–99)

## 2019-05-15 LAB — TSH: TSH: 1.159 u[IU]/mL (ref 0.350–4.500)

## 2019-05-15 LAB — HEMOGLOBIN A1C
Hgb A1c MFr Bld: 9.2 % — ABNORMAL HIGH (ref 4.8–5.6)
Mean Plasma Glucose: 217.34 mg/dL

## 2019-05-15 LAB — SARS CORONAVIRUS 2 (TAT 6-24 HRS): SARS Coronavirus 2: NEGATIVE

## 2019-05-15 MED ORDER — HYPROMELLOSE (GONIOSCOPIC) 2.5 % OP SOLN
1.0000 [drp] | Freq: Two times a day (BID) | OPHTHALMIC | Status: DC | PRN
  Filled 2019-05-15: qty 15

## 2019-05-15 MED ORDER — DEXTROSE 50 % IV SOLN
25.0000 g | Freq: Once | INTRAVENOUS | Status: AC
  Administered 2019-05-15: 25 g via INTRAVENOUS
  Filled 2019-05-15: qty 50

## 2019-05-15 MED ORDER — ASPIRIN EC 81 MG PO TBEC
81.0000 mg | DELAYED_RELEASE_TABLET | Freq: Every day | ORAL | Status: DC
  Administered 2019-05-15 – 2019-05-16 (×2): 81 mg via ORAL
  Filled 2019-05-15 (×2): qty 1

## 2019-05-15 MED ORDER — HEPARIN SODIUM (PORCINE) 5000 UNIT/ML IJ SOLN
5000.0000 [IU] | Freq: Three times a day (TID) | INTRAMUSCULAR | Status: DC
  Administered 2019-05-15 – 2019-05-17 (×7): 5000 [IU] via SUBCUTANEOUS
  Filled 2019-05-15 (×6): qty 1

## 2019-05-15 MED ORDER — VITAMIN D 25 MCG (1000 UNIT) PO TABS
5000.0000 [IU] | ORAL_TABLET | Freq: Every day | ORAL | Status: DC
  Administered 2019-05-15 – 2019-05-17 (×3): 5000 [IU] via ORAL
  Filled 2019-05-15 (×3): qty 5

## 2019-05-15 MED ORDER — PANTOPRAZOLE SODIUM 40 MG PO TBEC
40.0000 mg | DELAYED_RELEASE_TABLET | Freq: Every day | ORAL | Status: DC
  Administered 2019-05-15 – 2019-05-17 (×3): 40 mg via ORAL
  Filled 2019-05-15 (×3): qty 1

## 2019-05-15 MED ORDER — VITAMIN D-3 125 MCG (5000 UT) PO TABS: 5000.0000 | ORAL_TABLET | Freq: Every evening | ORAL | Status: DC

## 2019-05-15 MED ORDER — PREGABALIN 75 MG PO CAPS
75.0000 mg | ORAL_CAPSULE | Freq: Two times a day (BID) | ORAL | Status: DC
  Administered 2019-05-15 – 2019-05-17 (×4): 75 mg via ORAL
  Filled 2019-05-15 (×4): qty 1

## 2019-05-15 MED ORDER — SODIUM CHLORIDE 0.9 % IV BOLUS
1000.0000 mL | Freq: Once | INTRAVENOUS | Status: AC
  Administered 2019-05-15: 08:00:00 1000 mL via INTRAVENOUS

## 2019-05-15 MED ORDER — ATORVASTATIN CALCIUM 10 MG PO TABS
10.0000 mg | ORAL_TABLET | Freq: Every day | ORAL | Status: DC
  Administered 2019-05-15 – 2019-05-16 (×2): 10 mg via ORAL
  Filled 2019-05-15 (×2): qty 1

## 2019-05-15 MED ORDER — INSULIN ASPART 100 UNIT/ML ~~LOC~~ SOLN
0.0000 [IU] | Freq: Three times a day (TID) | SUBCUTANEOUS | Status: DC
  Administered 2019-05-15: 18:00:00 5 [IU] via SUBCUTANEOUS
  Administered 2019-05-16: 16:00:00 2 [IU] via SUBCUTANEOUS
  Administered 2019-05-16 – 2019-05-17 (×2): 1 [IU] via SUBCUTANEOUS

## 2019-05-15 MED ORDER — CLONAZEPAM 0.5 MG PO TABS
0.5000 mg | ORAL_TABLET | Freq: Every day | ORAL | Status: DC
  Administered 2019-05-15 – 2019-05-16 (×2): 0.5 mg via ORAL
  Filled 2019-05-15 (×2): qty 1

## 2019-05-15 MED ORDER — PROPYLENE GLYCOL 0.6 % OP SOLN: 1.0000 [drp] | Freq: Two times a day (BID) | OPHTHALMIC | Status: DC | PRN

## 2019-05-15 MED ORDER — DILTIAZEM HCL 25 MG/5ML IV SOLN
20.0000 mg | Freq: Once | INTRAVENOUS | Status: AC
  Administered 2019-05-15: 08:00:00 20 mg via INTRAVENOUS
  Filled 2019-05-15: qty 5

## 2019-05-15 MED ORDER — DEXTROSE-NACL 5-0.9 % IV SOLN
INTRAVENOUS | Status: DC
  Administered 2019-05-15: 16:00:00 via INTRAVENOUS

## 2019-05-15 MED ORDER — METOPROLOL SUCCINATE ER 25 MG PO TB24
25.0000 mg | ORAL_TABLET | Freq: Every day | ORAL | Status: DC
  Administered 2019-05-15 – 2019-05-17 (×3): 25 mg via ORAL
  Filled 2019-05-15 (×3): qty 1

## 2019-05-15 MED ORDER — TRAMADOL HCL 50 MG PO TABS
50.0000 mg | ORAL_TABLET | Freq: Four times a day (QID) | ORAL | Status: DC | PRN
  Administered 2019-05-15 – 2019-05-16 (×4): 50 mg via ORAL
  Filled 2019-05-15 (×4): qty 1

## 2019-05-15 NOTE — H&P (Signed)
Referring Physician: ER  YURIDIA UPP is an 63 y.o. female.                       Chief Complaint: Syncope/Altered mentation  HPI: 63 years old female with PMH of Cervical cancer, GERD, Hyperlipidemia, hypertension and type 2 DM had poor oral intake (post removal of teeth from upper and lower jaw on left side) for 2 days and had hypoglycemic episode at home with syncope, altered mentation and blood sugar level of 27 mg. when checked by EMS. IV D50 improved her mental status. She has also noticed intermittent palpitations for few weeks. She was found to be in atrial fibrillation with moderate ventricular response improved to sinus rhythm with IV 20 mg. diltiazem use x one. Her TSH is normal. She has h/o thyroid nodule.  Past Medical History:  Diagnosis Date  . Cancer ([place]) 1977   CERVICAL  . GERD (gastroesophageal reflux disease) 11/14/2013  . Gout   . Mixed hyperlipidemia 11/14/2013      Past Surgical History:  Procedure Laterality Date  . 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 CHOLECYSTECTOMY    . EYE SURGERY     6/3 LASER FOR GLAUCOMA, 9/03 CE/IOL IMPLANTS  . INGUINAL LYMPH NODE BIOPSY Right 06/08/2018   Procedure: RIGHT INGUINAL LYMPH NODE EXCISIONAL BIOPSY;  Surgeon: Clovis Riley, MD;  Location: [facility];  Service: General;  Laterality: Right;  . pancreatectomy    . SHOULDER CLOSED REDUCTION Left 02/03/2013   Procedure: CLOSED MANIPULATION SHOULDER;  Surgeon: Vickey Huger, MD;  Location: [facility];  Service: Orthopedics;  Laterality: Left;  CLOSED MANIPULATION LEFT SHOULDER  . SPLENECTOMY    . TONSILLECTOMY  1970    Family History  Problem Relation Age of Onset  . Dementia Mother   . Heart disease Mother   . Hypertension Mother   . COPD Mother   . Breast cancer Mother 25  . Heart disease Father   . Diabetes Father   . Glaucoma Father   . Cancer Father        BLADDER  . Depression Sister    Social History:  reports that she quit smoking about 29 years ago. She  has never used smokeless tobacco. She reports that she does not drink alcohol or use drugs.  Allergies:  Allergies  Allergen Reactions  . Gabapentin Other (See Comments)    Caused upset stomach  . Penicillins Hives and Other (See Comments)    Has patient had a PCN reaction causing immediate rash, facial/tongue/throat swelling, SOB or lightheadedness with hypotension: Unknown Has patient had a PCN reaction causing severe rash involving mucus membranes or skin necrosis: Unknown Has patient had a PCN reaction that required hospitalization: Unknown Has patient had a PCN reaction occurring within the last 10 years: No If all of the above answers are "NO", then may proceed with Cephalosporin use.   Marland Kitchen Pepcid [Famotidine] Other (See Comments)    HEADACHE    (Not in a hospital admission)   Results for orders placed or performed during the hospital encounter of 05/15/19 (from the past 48 hour(s))  Urinalysis, Routine w reflex microscopic     Status: Abnormal   Collection Time: 05/15/19  7:36 AM  Result Value Ref Range   Color, Urine STRAW (A) YELLOW   APPearance CLEAR CLEAR   Specific Gravity, Urine 1.003 (L) 1.005 - 1.030   pH 8.0 5.0 - 8.0   Glucose, UA 50 (A)  NEGATIVE mg/dL   Hgb urine dipstick SMALL (A) NEGATIVE   Bilirubin Urine NEGATIVE NEGATIVE   Ketones, ur NEGATIVE NEGATIVE mg/dL   Protein, ur 30 (A) NEGATIVE mg/dL   Nitrite NEGATIVE NEGATIVE   Leukocytes,Ua NEGATIVE NEGATIVE   RBC / HPF 0-5 0 - 5 RBC/hpf   WBC, UA 0-5 0 - 5 WBC/hpf   Bacteria, UA RARE (A) NONE SEEN   Hyaline Casts, UA PRESENT     Comment: Performed at [facility] [address], [place], [place] [address]  CBC     Status: Abnormal   Collection Time: 05/15/19  7:39 AM  Result Value Ref Range   WBC 12.0 (H) 4.0 - 10.5 K/uL    Comment: REPEATED TO VERIFY WHITE COUNT CONFIRMED ON SMEAR    RBC 5.42 (H) 3.87 - 5.11 MIL/uL   Hemoglobin 14.1 12.0 - 15.0 g/dL   HCT 45.3 36.0 - 46.0 %   MCV 83.6 80.0  - 100.0 fL   MCH 26.0 26.0 - 34.0 pg   MCHC 31.1 30.0 - 36.0 g/dL   RDW 15.8 (H) 11.5 - 15.5 %   Platelets 302 150 - 400 K/uL   nRBC 0.0 0.0 - 0.2 %    Comment: Performed at [facility], [place] [address], [place], [place] [address]  Comprehensive metabolic panel     Status: Abnormal   Collection Time: 05/15/19  7:39 AM  Result Value Ref Range   Sodium 139 135 - 145 mmol/L   Potassium 3.7 3.5 - 5.1 mmol/L   Chloride 98 98 - 111 mmol/L   CO2 27 22 - 32 mmol/L   Glucose, Bld 85 70 - 99 mg/dL   BUN 13 8 - 23 mg/dL   Creatinine, Ser 0.82 0.44 - 1.00 mg/dL   Calcium 10.0 8.9 - 10.3 mg/dL   Total Protein 7.2 6.5 - 8.1 g/dL   Albumin 3.8 3.5 - 5.0 g/dL   AST 31 15 - 41 U/L   ALT 20 0 - 44 U/L   Alkaline Phosphatase 157 (H) 38 - 126 U/L   Total Bilirubin 1.0 0.3 - 1.2 mg/dL   GFR calc non Af Amer >60 >60 mL/min   GFR calc Af Amer >60 >60 mL/min   Anion gap 14 5 - 15    Comment: Performed at [facility] [address], [place], [place] [address]  CBG monitoring, ED     Status: None   Collection Time: 05/15/19  8:08 AM  Result Value Ref Range   Glucose-Capillary 71 70 - 99 mg/dL  CBG monitoring, ED     Status: Abnormal   Collection Time: 05/15/19  9:42 AM  Result Value Ref Range   Glucose-Capillary 62 (L) 70 - 99 mg/dL  TSH     Status: None   Collection Time: 05/15/19  9:44 AM  Result Value Ref Range   TSH 1.159 0.350 - 4.500 uIU/mL    Comment: Performed by a 3rd Generation assay with a functional sensitivity of <=0.01 uIU/mL. Performed at [facility], [place] [address] [place], [place] [address]   CBG monitoring, ED     Status: Abnormal   Collection Time: 05/15/19 10:30 AM  Result Value Ref Range   Glucose-Capillary 65 (L) 70 - 99 mg/dL  CBG monitoring, ED     Status: Abnormal   Collection Time: 05/15/19 11:24 AM  Result Value Ref Range   Glucose-Capillary 174 (H) 70 - 99 mg/dL   No  results found.  Review Of Systems Constitutional: No fever, chills,  Positive weight loss, 20 pounds in 1 year. Eyes: No vision change, wears glasses. No discharge or pain. Ears: No hearing loss, No tinnitus. Respiratory: No asthma, COPD, pneumonias. No shortness of breath. No hemoptysis. Cardiovascular: No chest pain, positive palpitation, leg edema. Gastrointestinal: No nausea, vomiting, diarrhea, constipation. No GI bleed. No hepatitis. Genitourinary: No dysuria, hematuria, kidney stone. No incontinance. Neurological: No headache, stroke, seizures.  Psychiatry: No psych facility admission for anxiety, depression, suicide. No detox. Skin: No rash. Musculoskeletal: Positive joint pain, no fibromyalgia. No neck pain, back pain. Lymphadenopathy: No lymphadenopathy. Hematology: No anemia or easy bruising.   Blood pressure (!) 165/80, pulse 73, temperature (!) 95.7 F (35.4 C), temperature source Temporal, resp. rate 13, height 5' (1.524 m), weight 54.4 kg, SpO2 97 %. Body mass index is 23.44 kg/m. General appearance: alert, cooperative, appears stated age and no distress Head: Normocephalic, atraumatic. Mouth: tender left upper and lower jaw on left side. Eyes: Blue eyes, pink conjunctiva, corneas clear. PERRL, EOM's intact. Neck: No adenopathy, no carotid bruit, no JVD, supple, symmetrical, trachea midline and thyroid not enlarged. Resp: Clear to auscultation bilaterally. Cardio: Regular rate and rhythm, S1, S2 normal, II/VI systolic murmur, no click, rub or gallop GI: Soft, non-tender; bowel sounds normal; no organomegaly. Extremities: No edema, cyanosis or clubbing. Skin: Warm and dry.  Neurologic: Alert and oriented X 3, normal strength. Normal coordination.  Assessment/Plan Syncope Acute hypoglycemic attack Type 2 DM Atrial fibrillation, CHA2DS2VASc score of 4 Hypertension Hyperlipidemia  Place in observation. Home medications. Monitor sugar levels. Hold insulin for now. Consider anticoagulation post CT head without contrast.  Time spent:  Review of old records, Lab, x-rays, EKG, other cardiac tests, examination, discussion with ER physician and patient over 70 minutes.  Birdie Riddle, MD  05/15/2019, 12:37 PM

## 2019-05-15 NOTE — ED Notes (Signed)
Patient transported to X-ray 

## 2019-05-15 NOTE — ED Notes (Addendum)
CBG rechecked: 62mg /dL, GCS 15

## 2019-05-15 NOTE — ED Triage Notes (Signed)
Pt arrives via ems from home, c/o hypoglycemia, new onset afib also noted. Upon scene arrival, EMS noted cbg at 27, game 25g D10, increassed to 157 mg/dL. Pt was agitated (2.5mg  midazolam given) and diaphoretic with body temp 40F. En route, CBG dropped again to 60mg /dL, increased to 1411 with 25g of additional D10.  GCS improved from 15 to 15 en route. No h/o afib.  Teeth removed during procedure tues, did not take prescribed abx.

## 2019-05-15 NOTE — ED Notes (Signed)
ED TO INPATIENT HANDOFF REPORT  ED Nurse Name and Phone #: [place] RN [phone]  S Name/Age/Gender [name] 63 y.o. female Room/Bed: TRAAC/TRAAC  Code Status   Code Status: Full Code  Home/SNF/Other Home Patient oriented to: self, place, time and situation Is this baseline? Yes   Triage Complete: Triage complete  Chief Complaint Low CBG, low body temp  Triage Note Pt arrives via ems from home, c/o hypoglycemia, new onset afib also noted. Upon scene arrival, EMS noted cbg at 27, game 25g D10, increassed to 157 mg/dL. Pt was agitated (2.5mg  midazolam given) and diaphoretic with body temp 42F. En route, CBG dropped again to 60mg /dL, increased to 1411 with 25g of additional D10.  GCS improved from 15 to 15 en route. No h/o afib.  Teeth removed during procedure tues, did not take prescribed abx.    Allergies Allergies  Allergen Reactions  . Gabapentin Other (See Comments)    Caused upset stomach  . Penicillins Hives and Other (See Comments)    Has patient had a PCN reaction causing immediate rash, facial/tongue/throat swelling, SOB or lightheadedness with hypotension: Unknown Has patient had a PCN reaction causing severe rash involving mucus membranes or skin necrosis: Unknown Has patient had a PCN reaction that required hospitalization: Unknown Has patient had a PCN reaction occurring within the last 10 years: No If all of the above answers are "NO", then may proceed with Cephalosporin use.   Marland Kitchen Pepcid [Famotidine] Other (See Comments)    HEADACHE    Level of Care/Admitting Diagnosis ED Disposition    ED Disposition Condition Comment   Admit  Hospital Area: [facility] [[id number]]  Level of Care: Telemetry Cardiac [103]  Covid Evaluation: Person Under Investigation (PUI)  Diagnosis: Syncope and collapse [780.2.ICD-9-CM]  Admitting Physician: Dixie Dials [[id number]]  Attending Physician: Dixie Dials [[id number]]  PT Class (Do Not Modify): Observation [104]  PT Acc Code (Do Not Modify): Observation [10022]       B Medical/Surgery History Past Medical History:  Diagnosis Date  . Cancer ([place]) 1977   CERVICAL  . GERD (gastroesophageal reflux disease) 11/14/2013  . Gout   . Mixed hyperlipidemia 11/14/2013   Past Surgical History:  Procedure Laterality Date  . 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 CHOLECYSTECTOMY    . EYE SURGERY     6/3 LASER FOR GLAUCOMA, 9/03 CE/IOL IMPLANTS  . INGUINAL LYMPH NODE BIOPSY Right 06/08/2018   Procedure: RIGHT INGUINAL LYMPH NODE EXCISIONAL BIOPSY;  Surgeon: Clovis Riley, MD;  Location: [facility];  Service: General;  Laterality: Right;  . pancreatectomy    . SHOULDER CLOSED REDUCTION Left 02/03/2013   Procedure: CLOSED MANIPULATION SHOULDER;  Surgeon: Vickey Huger, MD;  Location: [facility];  Service: Orthopedics;  Laterality: Left;  CLOSED MANIPULATION LEFT SHOULDER  . SPLENECTOMY    . TONSILLECTOMY  1970     A IV Location/Drains/Wounds Patient Lines/Drains/Airways Status   Active Line/Drains/Airways    Name:   Placement date:   Placement time:   Site:   Days:   Peripheral IV 05/15/19 Anterior;Right Hand   05/15/19    0724    Hand   less than 1   External Urinary Catheter   05/15/19    0843    -   less than 1   Incision 02/04/13 Arm   02/04/13    0021     2291   Incision (Closed) 12/27/16 Shoulder Left   12/27/16    0807  869   Incision (Closed) 06/08/18 Groin   06/08/18    0824     341          Intake/Output Last 24 hours  Intake/Output Summary (Last 24 hours) at 05/15/2019 1515 Last data filed at 05/15/2019 [id number] Gross per 24 hour  Intake 1240 ml  Output -  Net 1240 ml    Labs/Imaging Results for orders placed or performed during the hospital encounter of 05/15/19 (from the past 48 hour(s))  Urinalysis, Routine w reflex microscopic     Status: Abnormal   Collection Time: 05/15/19  7:36 AM  Result Value Ref Range   Color, Urine STRAW (A) YELLOW   APPearance CLEAR CLEAR    Specific Gravity, Urine 1.003 (L) 1.005 - 1.030   pH 8.0 5.0 - 8.0   Glucose, UA 50 (A) NEGATIVE mg/dL   Hgb urine dipstick SMALL (A) NEGATIVE   Bilirubin Urine NEGATIVE NEGATIVE   Ketones, ur NEGATIVE NEGATIVE mg/dL   Protein, ur 30 (A) NEGATIVE mg/dL   Nitrite NEGATIVE NEGATIVE   Leukocytes,Ua NEGATIVE NEGATIVE   RBC / HPF 0-5 0 - 5 RBC/hpf   WBC, UA 0-5 0 - 5 WBC/hpf   Bacteria, UA RARE (A) NONE SEEN   Hyaline Casts, UA PRESENT     Comment: Performed at [facility], [address] [place], [place] [address]  CBC     Status: Abnormal   Collection Time: 05/15/19  7:39 AM  Result Value Ref Range   WBC 12.0 (H) 4.0 - 10.5 K/uL    Comment: REPEATED TO VERIFY WHITE COUNT CONFIRMED ON SMEAR    RBC 5.42 (H) 3.87 - 5.11 MIL/uL   Hemoglobin 14.1 12.0 - 15.0 g/dL   HCT 45.3 36.0 - 46.0 %   MCV 83.6 80.0 - 100.0 fL   MCH 26.0 26.0 - 34.0 pg   MCHC 31.1 30.0 - 36.0 g/dL   RDW 15.8 (H) 11.5 - 15.5 %   Platelets 302 150 - 400 K/uL   nRBC 0.0 0.0 - 0.2 %    Comment: Performed at [facility], [place] [address] [place], [place] [address]  Comprehensive metabolic panel     Status: Abnormal   Collection Time: 05/15/19  7:39 AM  Result Value Ref Range   Sodium 139 135 - 145 mmol/L   Potassium 3.7 3.5 - 5.1 mmol/L   Chloride 98 98 - 111 mmol/L   CO2 27 22 - 32 mmol/L   Glucose, Bld 85 70 - 99 mg/dL   BUN 13 8 - 23 mg/dL   Creatinine, Ser 0.82 0.44 - 1.00 mg/dL   Calcium 10.0 8.9 - 10.3 mg/dL   Total Protein 7.2 6.5 - 8.1 g/dL   Albumin 3.8 3.5 - 5.0 g/dL   AST 31 15 - 41 U/L   ALT 20 0 - 44 U/L   Alkaline Phosphatase 157 (H) 38 - 126 U/L   Total Bilirubin 1.0 0.3 - 1.2 mg/dL   GFR calc non Af Amer >60 >60 mL/min   GFR calc Af Amer >60 >60 mL/min   Anion gap 14 5 - 15    Comment: Performed at [facility] [address], [place], [place] [address]  CBG monitoring, ED     Status: None   Collection Time: 05/15/19  8:08 AM  Result Value Ref Range    Glucose-Capillary 71 70 - 99 mg/dL  CBG monitoring, ED     Status: Abnormal   Collection  Time: 05/15/19  9:42 AM  Result Value Ref Range   Glucose-Capillary 62 (L) 70 - 99 mg/dL  TSH     Status: None   Collection Time: 05/15/19  9:44 AM  Result Value Ref Range   TSH 1.159 0.350 - 4.500 uIU/mL    Comment: Performed by a 3rd Generation assay with a functional sensitivity of <=0.01 uIU/mL. Performed at [facility], [place] [address], [place], [place] [address]   CBG monitoring, ED     Status: Abnormal   Collection Time: 05/15/19 10:30 AM  Result Value Ref Range   Glucose-Capillary 65 (L) 70 - 99 mg/dL  CBG monitoring, ED     Status: Abnormal   Collection Time: 05/15/19 11:24 AM  Result Value Ref Range   Glucose-Capillary 174 (H) 70 - 99 mg/dL  T4, free     Status: None   Collection Time: 05/15/19  1:52 PM  Result Value Ref Range   Free T4 1.07 0.61 - 1.12 ng/dL    Comment: (NOTE) Biotin ingestion may interfere with free T4 tests. If the results are inconsistent with the TSH level, previous test results, or the clinical presentation, then consider biotin interference. If needed, order repeat testing after stopping biotin. Performed at [facility], [place] [address], [place], [place] [address]   Hemoglobin A1c     Status: Abnormal   Collection Time: 05/15/19  1:52 PM  Result Value Ref Range   Hgb A1c MFr Bld 9.2 (H) 4.8 - 5.6 %    Comment: (NOTE) Pre diabetes:          5.7%-6.4% Diabetes:              >6.4% Glycemic control for   <7.0% adults with diabetes    Mean Plasma Glucose 217.34 mg/dL    Comment: Performed at [facility] [address], [place], [place] [address]   X-ray Chest Pa And Lateral  Result Date: 05/15/2019 CLINICAL DATA:  Syncope, collapse EXAM: CHEST - 2 VIEW COMPARISON:  02/03/2013 FINDINGS: The heart size and mediastinal contours are within normal limits. Both lungs are clear. The visualized skeletal structures are unremarkable.  IMPRESSION: No acute abnormality of the lungs. Electronically Signed   By: Eddie Candle M.D.   On: 05/15/2019 13:38   Ct Head Wo Contrast  Result Date: 05/15/2019 CLINICAL DATA:  Altered mental status EXAM: CT HEAD WITHOUT CONTRAST TECHNIQUE: Contiguous axial images were obtained from the base of the skull through the vertex without intravenous contrast. COMPARISON:  05/31/2010 FINDINGS: Brain: No evidence of acute infarction, hemorrhage, hydrocephalus, extra-axial collection or mass lesion/mass effect. Vascular: No hyperdense vessel or unexpected calcification. Skull: Normal. Negative for fracture or focal lesion. Sinuses/Orbits: No acute finding. Other: None. IMPRESSION: No acute intracranial pathology. Electronically Signed   By: Eddie Candle M.D.   On: 05/15/2019 14:12    Pending Labs Unresulted Labs (From admission, onward)    Start     Ordered   05/16/19 [id number]  Basic metabolic panel  Tomorrow morning,   R     05/15/19 1228   05/16/19 0500  CBC  Tomorrow morning,   R     05/15/19 1228   05/15/19 1250  SARS CORONAVIRUS 2 Nasal Swab Aptima Multi Swab  (Asymptomatic/Tier 2 Patients Labs)  Once,   STAT    Question Answer Comment  Is this test for diagnosis or screening Screening   Symptomatic for COVID-19 as defined by CDC No   Hospitalized for  COVID-19 No   Admitted to ICU for COVID-19 No   Previously tested for COVID-19 No   Resident in a congregate (group) care setting No   Employed in healthcare setting No   Pregnant No      05/15/19 1250   05/15/19 1222  HIV antibody (Routine Testing)  Once,   STAT     05/15/19 1228          Vitals/Pain Today's Vitals   05/15/19 1245 05/15/19 1300 05/15/19 1415 05/15/19 1430  BP: (!) 164/77 (!) 168/94 (!) 172/92 (!) 173/75  Pulse: 73 73 79 80  Resp: 13 12 13 18   Temp:      TempSrc:      SpO2: 97% 97% 100% 98%  Weight:      Height:      PainSc:        Isolation Precautions No active isolations  Medications Medications  heparin  injection 5,000 Units (has no administration in time range)  aspirin EC tablet 81 mg (has no administration in time range)  atorvastatin (LIPITOR) tablet 10 mg (has no administration in time range)  clonazePAM (KLONOPIN) tablet 0.5 mg (has no administration in time range)  pantoprazole (PROTONIX) EC tablet 40 mg (has no administration in time range)  pregabalin (LYRICA) capsule 75 mg (75 mg Oral Not Given 05/15/19 1400)  traMADol (ULTRAM) tablet 50 mg (has no administration in time range)  metoprolol succinate (TOPROL-XL) 24 hr tablet 25 mg (has no administration in time range)  insulin aspart (novoLOG) injection 0-9 Units (has no administration in time range)  dextrose 5 %-0.9 % sodium chloride infusion (has no administration in time range)  cholecalciferol (VITAMIN D3) tablet 5,000 Units (has no administration in time range)  hydroxypropyl methylcellulose / hypromellose (ISOPTO TEARS / GONIOVISC) 2.5 % ophthalmic solution 1 drop (has no administration in time range)  sodium chloride 0.9 % bolus 1,000 mL (0 mLs Intravenous Stopped 05/15/19 0938)  diltiazem (CARDIZEM) injection 20 mg (20 mg Intravenous Given 05/15/19 0821)  dextrose 50 % solution 25 g (25 g Intravenous Given 05/15/19 1030)    Mobility walks Moderate fall risk   Focused Assessments Cardiac Assessment Handoff:  Cardiac Rhythm: Atrial fibrillation No results found for: CKTOTAL, CKMB, CKMBINDEX, TROPONINI No results found for: DDIMER Does the Patient currently have chest pain? No      R Recommendations: See Admitting Provider Note  Report given to:   Additional Notes: Husband at bedside

## 2019-05-15 NOTE — ED Notes (Signed)
The computer with scanner is not working I gave the pt 5 units of regular insulin sq lt upper arm

## 2019-05-15 NOTE — ED Notes (Signed)
MD notified cbg 62mg /dL

## 2019-05-15 NOTE — ED Notes (Signed)
Pt CBG 115. Notified [place], Therapist, sports.

## 2019-05-15 NOTE — ED Notes (Signed)
Report given to rn on 6e  The pts tray was not delivered

## 2019-05-15 NOTE — ED Notes (Signed)
ED Provider at bedside. 

## 2019-05-15 NOTE — ED Provider Notes (Signed)
[facility] EMERGENCY DEPARTMENT Provider Note   CSN: [id number] Arrival date & time: 05/15/19  2505     History   Chief Complaint Chief Complaint  Patient presents with  . Hypoglycemia  . Atrial Fibrillation    HPI [name] is a 63 y.o. female.     Patient with hx iddm, presents with acute alteration of mental status this AM, and EMS also noted in a fib. Patient states this past week had 3 teeth extracted. Due to that, pt notes decreased po intake for past several days, just eating soft foods, etc. Last PM when went to bed felt at baseline. This AM, husband awoke to find area around patient/sheets very wet with sweat, and pt unresponsive/altered. He also noted at some point during night, patient had placed a bowl of sugar by her bedstand. EMS checked sugar, was 27, and gave d50 with improvement in mental status. Also noted afib on monitor, no history of same. Pt notes in past few weeks has intermittently been aware of a sensation of heart beating irregularly. No chest pain or sob. Denies hx afib or other dysrhythmia. No heat intol, sweats, or wt loss. Indicates was told had thyroid nodule in past. No etoh use. No fevers.   The history is provided by the patient, the spouse and the EMS personnel.  Hypoglycemia Associated symptoms: sweats   Associated symptoms: no shortness of breath and no vomiting   Atrial Fibrillation Pertinent negatives include no chest pain, no abdominal pain, no headaches and no shortness of breath.    Past Medical History:  Diagnosis Date  . Cancer ([place]) 1977   CERVICAL  . GERD (gastroesophageal reflux disease) 11/14/2013  . Gout   . Mixed hyperlipidemia 11/14/2013    Patient Active Problem List   Diagnosis Date Noted  . Gout 06/11/2015  . Poorly controlled T2_DM due to poor compliance 06/11/2015  . Non-compliance with treatment 09/20/2014  . Insulin Requiring T2_DM 09/07/2014  . Vitamin D deficiency 09/07/2014  . Medication  management 09/07/2014  . GERD (gastroesophageal reflux disease) 11/14/2013  . Essential hypertension 11/14/2013  . Mixed hyperlipidemia 11/14/2013  . 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 CHOLECYSTECTOMY    . EYE SURGERY     6/3 LASER FOR GLAUCOMA, 9/03 CE/IOL IMPLANTS  . INGUINAL LYMPH NODE BIOPSY Right 06/08/2018   Procedure: RIGHT INGUINAL LYMPH NODE EXCISIONAL BIOPSY;  Surgeon: Clovis Riley, MD;  Location: [facility];  Service: General;  Laterality: Right;  . pancreatectomy    . SHOULDER CLOSED REDUCTION Left 02/03/2013   Procedure: CLOSED MANIPULATION SHOULDER;  Surgeon: Vickey Huger, MD;  Location: [facility];  Service: Orthopedics;  Laterality: Left;  CLOSED MANIPULATION LEFT SHOULDER  . SPLENECTOMY    . TONSILLECTOMY  1970     OB History   No obstetric history on file.      Home Medications    Prior to Admission medications   Medication Sig Start Date End Date Taking? Authorizing Provider  acetaminophen (TYLENOL) 500 MG tablet Take 500 mg by mouth at bedtime.     [provider]  aspirin EC 81 MG tablet Take 81 mg by mouth at bedtime.     [provider]  Blood Glucose Monitoring Suppl (ONETOUCH VERIO) w/Device KIT 1 kit by Does not apply route 3 (three) times daily. 12/24/16   Unk Pinto, MD  Cholecalciferol (VITAMIN  D-3) 5000 units TABS Take 5,000 tablets by mouth every evening.    [provider]  clonazePAM (KLONOPIN) 1 MG tablet Take 0.5 mg by mouth daily as needed for anxiety. 05/27/18   [provider]  fexofenadine (ALLEGRA) 180 MG tablet Take 180 mg by mouth daily as needed for allergies.     [provider]  glucose blood (ONETOUCH VERIO) test strip CHECK BLOOD SUGAR 3 TIMES DAILY-DX-E11.9 AND Z79.4. 12/24/16   Unk Pinto, MD  insulin NPH-regular Human (NOVOLIN 70/30) (70-30) 100 UNIT/ML injection Inject 10-12 Units into the  skin See admin instructions. Take 12 units in the morning, and 10 units at night    [provider]  losartan (COZAAR) 25 MG tablet Take 12.5 mg by mouth daily as needed. If blood pressure is >140 05/27/18   [provider]  Jonetta Speak LANCETS 93T MISC Check blood sugar 3 times daily-DX-E11.9 and Z79.4. 01/23/17   Unk Pinto, MD  pregabalin (LYRICA) 75 MG capsule TAKE 1 CAPSULE BY MOUTH TWICE A DAY 12/31/18   Trula Slade, DPM  Propylene Glycol (SYSTANE COMPLETE) 0.6 % SOLN Place 1 drop into both eyes 2 (two) times daily as needed (for dry eyes).    [provider]  traMADol (ULTRAM) 50 MG tablet Take 1 tablet (50 mg total) by mouth every 6 (six) hours as needed. 06/08/18 06/08/19  Clovis Riley, MD    Family History Family History  Problem Relation Age of Onset  . Dementia Mother   . Heart disease Mother   . Hypertension Mother   . COPD Mother   . Breast cancer Mother 31  . Heart disease Father   . Diabetes Father   . Glaucoma Father   . Cancer Father        BLADDER  . Depression Sister     Social History Social History   Tobacco Use  . Smoking status: Former Smoker    Quit date: 09/23/1989    Years since quitting: 29.6  . Smokeless tobacco: Never Used  Substance Use Topics  . Alcohol use: No    Alcohol/week: 0.0 standard drinks  . Drug use: No    Comment: quit>20years ago     Allergies   Gabapentin, Penicillins, and Pepcid [famotidine]   Review of Systems Review of Systems  Constitutional: Positive for diaphoresis. Negative for chills and fever.  HENT: Negative for sore throat.   Eyes: Negative for visual disturbance.  Respiratory: Negative for cough and shortness of breath.   Cardiovascular: Negative for chest pain and leg swelling.  Gastrointestinal: Negative for abdominal pain, diarrhea and vomiting.  Endocrine: Negative for polyuria.  Genitourinary: Negative for flank pain.  Musculoskeletal: Negative for back pain and  neck pain.  Skin: Negative for rash.  Neurological: Negative for headaches.  Hematological: Does not bruise/bleed easily.  Psychiatric/Behavioral: Positive for confusion.     Physical Exam Updated Vital Signs BP 128/88   Pulse 94   Temp (!) 95.7 F (35.4 C) (Temporal)   Resp 13   Ht 1.524 m (5')   Wt 54.4 kg   SpO2 100%   BMI 23.44 kg/m   Physical Exam Vitals signs and nursing note reviewed.  Constitutional:      Appearance: Normal appearance. She is well-developed.  HENT:     Head: Atraumatic.     Nose: Nose normal.     Mouth/Throat:     Mouth: Mucous membranes are moist.     Comments: Recent dental extraction left  upper. No sign of infection, no bleeding.  Eyes:     General: No scleral icterus.    Conjunctiva/sclera: Conjunctivae normal.     Pupils: Pupils are equal, round, and reactive to light.  Neck:     Musculoskeletal: Normal range of motion and neck supple. No neck rigidity or muscular tenderness.     Vascular: No carotid bruit.     Trachea: No tracheal deviation.     Comments: Thyroid not grossly enlarged or tender.  Cardiovascular:     Rate and Rhythm: Tachycardia present. Rhythm irregular.     Pulses: Normal pulses.     Heart sounds: Normal heart sounds. No murmur. No friction rub. No gallop.   Pulmonary:     Effort: Pulmonary effort is normal. No respiratory distress.     Breath sounds: Normal breath sounds.  Abdominal:     General: Bowel sounds are normal. There is no distension.     Palpations: Abdomen is soft. There is no mass.     Tenderness: There is no abdominal tenderness. There is no guarding.  Genitourinary:    Comments: No cva tenderness.  Musculoskeletal:        General: No swelling or tenderness.     Right lower leg: No edema.     Left lower leg: No edema.  Skin:    General: Skin is warm and dry.     Findings: No rash.  Neurological:     Mental Status: She is alert.     Comments: Alert, speech normal. Motor intact bil, stre 5/5.  sens grossly intact bil.   Psychiatric:        Mood and Affect: Mood normal.      ED Treatments / Results  Labs (all labs ordered are listed, but only abnormal results are displayed) Results for orders placed or performed during the hospital encounter of 05/15/19  CBC  Result Value Ref Range   WBC 12.0 (H) 4.0 - 10.5 K/uL   RBC 5.42 (H) 3.87 - 5.11 MIL/uL   Hemoglobin 14.1 12.0 - 15.0 g/dL   HCT 45.3 36.0 - 46.0 %   MCV 83.6 80.0 - 100.0 fL   MCH 26.0 26.0 - 34.0 pg   MCHC 31.1 30.0 - 36.0 g/dL   RDW 15.8 (H) 11.5 - 15.5 %   Platelets 302 150 - 400 K/uL   nRBC 0.0 0.0 - 0.2 %  Comprehensive metabolic panel  Result Value Ref Range   Sodium 139 135 - 145 mmol/L   Potassium 3.7 3.5 - 5.1 mmol/L   Chloride 98 98 - 111 mmol/L   CO2 27 22 - 32 mmol/L   Glucose, Bld 85 70 - 99 mg/dL   BUN 13 8 - 23 mg/dL   Creatinine, Ser 0.82 0.44 - 1.00 mg/dL   Calcium 10.0 8.9 - 10.3 mg/dL   Total Protein 7.2 6.5 - 8.1 g/dL   Albumin 3.8 3.5 - 5.0 g/dL   AST 31 15 - 41 U/L   ALT 20 0 - 44 U/L   Alkaline Phosphatase 157 (H) 38 - 126 U/L   Total Bilirubin 1.0 0.3 - 1.2 mg/dL   GFR calc non Af Amer >60 >60 mL/min   GFR calc Af Amer >60 >60 mL/min   Anion gap 14 5 - 15  Urinalysis, Routine w reflex microscopic  Result Value Ref Range   Color, Urine STRAW (A) YELLOW   APPearance CLEAR CLEAR   Specific Gravity, Urine 1.003 (L) 1.005 - 1.030  pH 8.0 5.0 - 8.0   Glucose, UA 50 (A) NEGATIVE mg/dL   Hgb urine dipstick SMALL (A) NEGATIVE   Bilirubin Urine NEGATIVE NEGATIVE   Ketones, ur NEGATIVE NEGATIVE mg/dL   Protein, ur 30 (A) NEGATIVE mg/dL   Nitrite NEGATIVE NEGATIVE   Leukocytes,Ua NEGATIVE NEGATIVE   RBC / HPF 0-5 0 - 5 RBC/hpf   WBC, UA 0-5 0 - 5 WBC/hpf   Bacteria, UA RARE (A) NONE SEEN   Hyaline Casts, UA PRESENT   TSH  Result Value Ref Range   TSH 1.159 0.350 - 4.500 uIU/mL  CBG monitoring, ED  Result Value Ref Range   Glucose-Capillary 71 70 - 99 mg/dL  CBG monitoring,  ED  Result Value Ref Range   Glucose-Capillary 62 (L) 70 - 99 mg/dL  CBG monitoring, ED  Result Value Ref Range   Glucose-Capillary 65 (L) 70 - 99 mg/dL  CBG monitoring, ED  Result Value Ref Range   Glucose-Capillary 174 (H) 70 - 99 mg/dL   No results found.  EKG EKG Interpretation  Date/Time:  Saturday May 15 2019 07:23:15 EDT Ventricular Rate:  99 PR Interval:    QRS Duration: 87 QT Interval:  352 QTC Calculation: 452 R Axis:   91 Text Interpretation:  Atrial fibrillation Right axis deviation Nonspecific ST abnormality Confirmed by Lajean Saver [phone]) on 05/15/2019 8:19:49 AM   Radiology No results found.  Procedures Procedures (including critical care time)  Medications Ordered in ED Medications  sodium chloride 0.9 % bolus 1,000 mL (has no administration in time range)  diltiazem (CARDIZEM) injection 20 mg (has no administration in time range)     Initial Impression / Assessment and Plan / ED Course  I have reviewed the triage vital signs and the nursing notes.  Pertinent labs & imaging results that were available during my care of the patient were reviewed by me and considered in my medical decision making (see chart for details).  Iv ns. Continuous pulse ox and monitor. Ecg. CBG. Stat labs. Patient given po fluids/meal.  Reviewed nursing notes and prior charts for additional history.   HR 116, afib. cardizem iv bolus/gtt.   Await labs.   Recheck hr 78, afib.   Labs reviewed by me - chem normal.  chadsvasc score = 4.   Recheck pt, awake and alert. No c/o. No nv. No pain. No fever/chills. Normal mental status.   Recheck, remains in sinus rhythm off of cardizem.  Discussed pt with Dr Doylene Canard - he indicates he will admit, obs, anticoag.   CRITICAL CARE RE: new onset afib/rapid ventric response, hypolgycemia with altered mental status requiring iv d50. Performed by: Mirna Mires Total critical care time: 35 minutes Critical care time was  exclusive of separately billable procedures and treating other patients. Critical care was necessary to treat or prevent imminent or life-threatening deterioration. Critical care was time spent personally by me on the following activities: development of treatment plan with patient and/or surrogate as well as nursing, discussions with consultants, evaluation of patient's response to treatment, examination of patient, obtaining history from patient or surrogate, ordering and performing treatments and interventions, ordering and review of laboratory studies, ordering and review of radiographic studies, pulse oximetry and re-evaluation of patient's condition.  As relates hypoglycemia, currently improved post meds/d50, po fluids/food. Feel likely related to decreased po intake in past 3-4 days post dental extraction while pt was taking her normal insulin. Would rec hold or decreasing insulin until po intake returns to normal.  Final Clinical Impressions(s) / ED Diagnoses   Final diagnoses:  None    ED Discharge Orders    None       Lajean Saver, MD 05/15/19 1227

## 2019-05-15 NOTE — ED Notes (Signed)
Patient transported to CT 

## 2019-05-15 NOTE — ED Notes (Signed)
8oz apple juice provided

## 2019-05-16 ENCOUNTER — Observation Stay (HOSPITAL_COMMUNITY): Payer: Medicare Other

## 2019-05-16 DIAGNOSIS — I10 Essential (primary) hypertension: Secondary | ICD-10-CM | POA: Diagnosis not present

## 2019-05-16 DIAGNOSIS — I4891 Unspecified atrial fibrillation: Secondary | ICD-10-CM | POA: Diagnosis not present

## 2019-05-16 DIAGNOSIS — E119 Type 2 diabetes mellitus without complications: Secondary | ICD-10-CM | POA: Diagnosis not present

## 2019-05-16 DIAGNOSIS — R55 Syncope and collapse: Secondary | ICD-10-CM | POA: Diagnosis not present

## 2019-05-16 LAB — LIPID PANEL
Cholesterol: 117 mg/dL (ref 0–200)
HDL: 49 mg/dL (ref >40)
LDL Cholesterol: 45 mg/dL (ref 0–99)
Total CHOL/HDL Ratio: 2.4 RATIO
Triglycerides: 114 mg/dL (ref <150)
VLDL: 23 mg/dL (ref 0–40)

## 2019-05-16 LAB — CBC
HCT: 35.9 % — ABNORMAL LOW (ref 36.0–46.0)
Hemoglobin: 11.3 g/dL — ABNORMAL LOW (ref 12.0–15.0)
MCH: 25.9 pg — ABNORMAL LOW (ref 26.0–34.0)
MCHC: 31.5 g/dL (ref 30.0–36.0)
MCV: 82.2 fL (ref 80.0–100.0)
Platelets: 293 10*3/uL (ref 150–400)
RBC: 4.37 MIL/uL (ref 3.87–5.11)
RDW: 15.5 % (ref 11.5–15.5)
WBC: 11.6 10*3/uL — ABNORMAL HIGH (ref 4.0–10.5)
nRBC: 0 % (ref 0.0–0.2)

## 2019-05-16 LAB — HIV ANTIBODY (ROUTINE TESTING W REFLEX): HIV Screen 4th Generation wRfx: NONREACTIVE

## 2019-05-16 LAB — BASIC METABOLIC PANEL
Anion gap: 8 (ref 5–15)
BUN: 8 mg/dL (ref 8–23)
CO2: 26 mmol/L (ref 22–32)
Calcium: 9.1 mg/dL (ref 8.9–10.3)
Chloride: 104 mmol/L (ref 98–111)
Creatinine, Ser: 0.83 mg/dL (ref 0.44–1.00)
GFR calc Af Amer: >60 mL/min (ref >60)
GFR calc non Af Amer: >60 mL/min (ref >60)
Glucose, Bld: 188 mg/dL — ABNORMAL HIGH (ref 70–99)
Potassium: 4.2 mmol/L (ref 3.5–5.1)
Sodium: 138 mmol/L (ref 135–145)

## 2019-05-16 LAB — ECHOCARDIOGRAM COMPLETE
Height: 60 in
Weight: 2008 oz

## 2019-05-16 LAB — GLUCOSE, CAPILLARY
Glucose-Capillary: 107 mg/dL — ABNORMAL HIGH (ref 70–99)
Glucose-Capillary: 141 mg/dL — ABNORMAL HIGH (ref 70–99)
Glucose-Capillary: 182 mg/dL — ABNORMAL HIGH (ref 70–99)
Glucose-Capillary: 217 mg/dL — ABNORMAL HIGH (ref 70–99)
Glucose-Capillary: 98 mg/dL (ref 70–99)

## 2019-05-16 MED ORDER — LIVING WELL WITH DIABETES BOOK
Freq: Once | Status: AC
  Administered 2019-05-16: 21:00:00
  Filled 2019-05-16: qty 1

## 2019-05-16 MED ORDER — ALPRAZOLAM 0.25 MG PO TABS
0.2500 mg | ORAL_TABLET | Freq: Once | ORAL | Status: AC
  Administered 2019-05-16: 0.25 mg via ORAL
  Filled 2019-05-16: qty 1

## 2019-05-16 NOTE — Care Management Obs Status (Signed)
[facility] NOTIFICATION   Patient Details  Name: [name] MRN: [id number] Date of Birth: 1956-01-31   Medicare Observation Status Notification Given:  Yes    Claudie Leach, RN 05/16/2019, 3:58 PM

## 2019-05-16 NOTE — Progress Notes (Signed)
Ref: Lanice Shirts, MD   Subjective:  Feeling better. EKG suggestive of anterior wall MI. Patient has FH of heart disease to Mom and Dad. She is worried about blockages in heart. Echocardiogram shows normal LV systolic function with mild LVH and mild diastolic dysfunction.  Objective:  Vital Signs in the last 24 hours: Temp:  [98.2 F (36.8 C)-98.6 F (37 C)] 98.4 F (36.9 C) (08/23 0409) Pulse Rate:  [70-83] 75 (08/23 0409) Cardiac Rhythm: Normal sinus rhythm (08/23 0802) Resp:  [8-26] 11 (08/23 0409) BP: (119-173)/(69-123) 146/70 (08/23 0409) SpO2:  [96 %-100 %] 96 % (08/23 0409) Weight:  [55.4 kg-56.9 kg] 56.9 kg (08/23 0409)  Physical Exam: BP Readings from Last 1 Encounters:  05/16/19 (!) 146/70     Wt Readings from Last 1 Encounters:  05/16/19 56.9 kg    Weight change:  Body mass index is 24.51 kg/m. HEENT: [place]/AT, Eyes-Blue, PERL, EOMI, Conjunctiva-Pink, Sclera-Non-icteric Neck: No JVD, No bruit, Trachea midline. Lungs:  Clear, Bilateral. Cardiac:  Regular rhythm, normal S1 and S2, no S3. II/VI systolic murmur. Abdomen:  Soft, non-tender. BS present. Extremities:  No edema present. No cyanosis. No clubbing. CNS: AxOx3, Cranial nerves grossly intact, moves all 4 extremities.  Skin: Warm and dry.   Intake/Output from previous day: 08/22 0701 - 08/23 0700 In: 2140 [P.O.:780; I.V.:360; IV Piggyback:1000] Out: 900 [Urine:900]    Lab Results: BMET    Component Value Date/Time   NA 138 05/16/2019 0359   NA 139 05/15/2019 0739   NA 143 06/23/2018 1337   K 4.2 05/16/2019 0359   K 3.7 05/15/2019 0739   K 4.4 06/23/2018 1337   CL 104 05/16/2019 0359   CL 98 05/15/2019 0739   CL 103 06/23/2018 1337   CO2 26 05/16/2019 0359   CO2 27 05/15/2019 0739   CO2 32 06/23/2018 1337   GLUCOSE 188 (H) 05/16/2019 0359   GLUCOSE 85 05/15/2019 0739   GLUCOSE 125 (H) 06/23/2018 1337   BUN 8 05/16/2019 0359   BUN 13 05/15/2019 0739   BUN 19 06/23/2018 1337   CREATININE 0.83 05/16/2019 0359   CREATININE 0.82 05/15/2019 0739   CREATININE 0.88 06/23/2018 1337   CREATININE 0.83 06/04/2018 1529   CREATININE 0.93 05/04/2018 1250   CREATININE 0.59 12/24/2016 1212   CREATININE 0.59 05/19/2015 1119   CREATININE 0.71 09/07/2014 1737   CALCIUM 9.1 05/16/2019 0359   CALCIUM 10.0 05/15/2019 0739   CALCIUM 10.2 06/23/2018 1337   GFRNONAA >60 05/16/2019 0359   GFRNONAA >60 05/15/2019 0739   GFRNONAA >60 06/23/2018 1337   GFRNONAA >60 06/04/2018 1529   GFRNONAA >60 05/04/2018 1250   GFRNONAA >89 12/24/2016 1212   GFRNONAA >89 05/19/2015 1119   GFRNONAA >89 09/07/2014 1737   GFRAA >60 05/16/2019 0359   GFRAA >60 05/15/2019 0739   GFRAA >60 06/23/2018 1337   GFRAA >60 06/04/2018 1529   GFRAA >60 05/04/2018 1250   GFRAA >89 12/24/2016 1212   GFRAA >89 05/19/2015 1119   GFRAA >89 09/07/2014 1737   CBC    Component Value Date/Time   WBC 11.6 (H) 05/16/2019 0359   RBC 4.37 05/16/2019 0359   HGB 11.3 (L) 05/16/2019 0359   HCT 35.9 (L) 05/16/2019 0359   PLT 293 05/16/2019 0359   MCV 82.2 05/16/2019 0359   MCH 25.9 (L) 05/16/2019 0359   MCHC 31.5 05/16/2019 0359   RDW 15.5 05/16/2019 0359   LYMPHSABS 3.5 (H) 06/23/2018 1337   MONOABS 0.5 06/23/2018 1337  EOSABS 0.3 06/23/2018 1337   BASOSABS 0.2 (H) 06/23/2018 1337   HEPATIC Function Panel Recent Labs    06/23/18 1337 05/15/19 0739  PROT 7.7 7.2   HEMOGLOBIN A1C No components found for: HGA1C,  MPG CARDIAC ENZYMES No results found for: CKTOTAL, CKMB, CKMBINDEX, TROPONINI BNP No results for input(s): PROBNP in the last 8760 hours. TSH Recent Labs    05/15/19 0944  TSH 1.159   CHOLESTEROL No results for input(s): CHOL in the last 8760 hours.  Scheduled Meds: . aspirin EC  81 mg Oral QHS  . atorvastatin  10 mg Oral q1800  . cholecalciferol  5,000 Units Oral Daily  . clonazePAM  0.5 mg Oral QHS  . heparin  5,000 Units Subcutaneous Q8H  . insulin aspart  0-9 Units Subcutaneous  TID WC  . living well with diabetes book   Does not apply Once  . metoprolol succinate  25 mg Oral Daily  . pantoprazole  40 mg Oral Daily  . pregabalin  75 mg Oral BID   Continuous Infusions: . dextrose 5 % and 0.9% NaCl Stopped (05/16/19 0015)   PRN Meds:.hydroxypropyl methylcellulose / hypromellose, traMADol  Assessment/Plan: Acute hypoglycemic attack Syncope from above Type 2 DM Atrial fibrillation, CHA2DS2VASc score of 4 Hypertension Hyperlipidemia Abnormal EKG FH of heart disease  NM Myocardial perfusion stress test in AM Check lipid panel.   LOS: 0 days   Time spent including chart review, lab review, examination, discussion with patient :30  min   Dixie Dials  MD  05/16/2019, 12:06 PM

## 2019-05-16 NOTE — Progress Notes (Signed)
  Echocardiogram 2D Echocardiogram has been performed.  [name] 05/16/2019, 11:04 AM

## 2019-05-17 ENCOUNTER — Observation Stay (HOSPITAL_COMMUNITY): Payer: Medicare Other

## 2019-05-17 DIAGNOSIS — R55 Syncope and collapse: Secondary | ICD-10-CM | POA: Diagnosis not present

## 2019-05-17 LAB — GLUCOSE, CAPILLARY
Glucose-Capillary: 115 mg/dL — ABNORMAL HIGH (ref 70–99)
Glucose-Capillary: 128 mg/dL — ABNORMAL HIGH (ref 70–99)
Glucose-Capillary: 178 mg/dL — ABNORMAL HIGH (ref 70–99)

## 2019-05-17 MED ORDER — REGADENOSON 0.4 MG/5ML IV SOLN
0.4000 mg | Freq: Once | INTRAVENOUS | Status: AC
  Administered 2019-05-17: 11:00:00 0.4 mg via INTRAVENOUS
  Filled 2019-05-17: qty 5

## 2019-05-17 MED ORDER — METOPROLOL SUCCINATE ER 25 MG PO TB24: 25.0000 mg | ORAL_TABLET | Freq: Every day | ORAL | 2 refills | Status: AC

## 2019-05-17 MED ORDER — REGADENOSON 0.4 MG/5ML IV SOLN
INTRAVENOUS | Status: AC
  Filled 2019-05-17: qty 5

## 2019-05-17 MED ORDER — TECHNETIUM TC 99M TETROFOSMIN IV KIT
10.0000 | PACK | Freq: Once | INTRAVENOUS | Status: AC | PRN
  Administered 2019-05-17: 10:00:00 10 via INTRAVENOUS

## 2019-05-17 MED ORDER — TECHNETIUM TC 99M TETROFOSMIN IV KIT
30.0000 | PACK | Freq: Once | INTRAVENOUS | Status: AC | PRN
  Administered 2019-05-17: 13:00:00 30 via INTRAVENOUS

## 2019-05-17 MED ORDER — LOSARTAN POTASSIUM 25 MG PO TABS: 25.0000 mg | ORAL_TABLET | Freq: Every day | ORAL | Status: DC

## 2019-05-17 MED ORDER — APIXABAN 5 MG PO TABS: 5.0000 mg | ORAL_TABLET | Freq: Two times a day (BID) | ORAL | Status: DC

## 2019-05-17 MED ORDER — INSULIN NPH ISOPHANE & REGULAR (70-30) 100 UNIT/ML ~~LOC~~ SUSP: 5.0000 [IU] | SUBCUTANEOUS | 11 refills | Status: DC

## 2019-05-17 MED ORDER — APIXABAN 5 MG PO TABS: 5.0000 mg | ORAL_TABLET | Freq: Two times a day (BID) | ORAL | 3 refills | Status: AC

## 2019-05-17 MED ORDER — LOSARTAN POTASSIUM 25 MG PO TABS: 25.0000 mg | ORAL_TABLET | Freq: Every day | ORAL | 3 refills | Status: DC

## 2019-05-17 NOTE — Progress Notes (Signed)
Inpatient Diabetes Program Recommendations  AACE/ADA: New Consensus Statement on Inpatient Glycemic Control (2015)  Target Ranges:  Prepandial:   less than 140 mg/dL      Peak postprandial:   less than 180 mg/dL (1-2 hours)      Critically ill patients:  140 - 180 mg/dL   Lab Results  Component Value Date   GLUCAP 128 (H) 05/17/2019   HGBA1C 9.2 (H) 05/15/2019    Review of Glycemic Control Results for [name], [name] (MRN [id number]) as of 05/17/2019 11:36  Ref. Range 05/16/2019 07:25 05/16/2019 11:05 05/16/2019 16:11 05/16/2019 22:04 05/17/2019 07:58  Glucose-Capillary Latest Ref Range: 70 - 99 mg/dL 141 (H) 98 182 (H) 107 (H) 128 (H)   Diabetes history: DM 2 Outpatient Diabetes medications:  70/30 12 units AM, 70/30 10 units PM Current orders for Inpatient glycemic control:  Novolog sensitive tid with meals and HS  Inpatient Diabetes Program Recommendations:    Patient to d/c home today.  Discussed in length DM management. Patient was taking 70/30 at bedtime prior to admission and had a severe low blood sugar. Patient states that blood sugar was 104 mg/dL prior to bedtime.  We discussed that if blood sugar less than 120 mg/dL, she needs to eat a snack of 15-30 grams.   Reviewed "meal planning" handout with patient and "plate method".  Patient appreciative of information stating she has never been taught that much about eating, insulin, etc.   Patient checks blood sugars 3 times a day.  She is interested in Freestyle CGM for glucose monitoring.  Spoke with Dr. Doylene Canard and orders received for Freestyle CGM for discharge. Patient plans to f/u with Dr. Buddy Duty. Education done regarding application and changing CGM sensor (alternate every 14 days on back of arms), 1 hour warm-up, use of glucometer/where to buy strips, how to scan CGM for glucose reading and information for PCP. Patient has been given [employer] reader and 2 sensors for use. Patient has also been given educational packet  regarding use CGM sensor including the 1-800 toll free number for any questions, problems or needs related to the [facility] sensors or reader. Sensor applied by patient to left arm at 1530.  Explained that glucose readings will not be available until 1 hour after application.  Patient verbalizes understanding of use of Freestyle Libre CGM and was told that any issues with blood sugars/diabetes will need to be addressed by PCP.  Husband also present for training.  We reviewed treatment for hypoglycemia and signs and symptoms.   Thanks,  Adah Perl, RN, BC-ADM Inpatient Diabetes Coordinator Pager [phone] (8a-5p)

## 2019-05-17 NOTE — Discharge Instructions (Signed)

## 2019-05-17 NOTE — Progress Notes (Signed)
I retook patient's B/P manually and it was still elevated. Patient is asymptomatic but is a little anxious about being in the hospital and all the new information that she has been receiving, will continue to monitor and will speak with Dr Lelon Huh regarding her elevated B/P and her concerns.

## 2019-05-17 NOTE — Progress Notes (Signed)
Patient received Xanax 0.25mg  po earlier this evening and was sleeping nicely but patient's husband called so I was able to recheck her B/P which has come down nicely and she states that she was able to sleep for awhile and is feeling less anxious at this time, will continue to monitor.

## 2019-05-17 NOTE — Discharge Summary (Signed)
Physician Discharge Summary  Patient ID: [name] MRN: [id number] DOB/AGE: 09/30/1955 63 y.o.  Admit date: 05/15/2019 Discharge date: 05/17/2019  Admission Diagnoses: Syncope Acute hypoglycemic attack Type 2 DM Atrial fibrillation, new or recent, CHA2DS2VASc score of 4 Hypertension Hyperlipidemia  Discharge Diagnoses:  Principle problem: Syncope and collapse Active Problems:   Acute hypoglycemic attack   Atrial fibrillation, paroxysmal, CHA2DS2VASc score of 4   Syncope   Type 2 DM   Hypertension   Hyperlipidemia   FH of heart disease  Discharged Condition: fair  Hospital Course: 63 years old white female with PMH of cervical cancer, GERD, Hyperlipidemia, HTN and type 2 DM had poor oral intake post removal of teeth from upper and lower jaws on left side of mouth had hypoglycemic attack with acute mentqal status change and syncope. Her blood sugar was 27 mg. and she was treated with D50. Her mentation improved.  She was also in atrial fibrillation with moderate to rapid ventricular response. She converted to Sinus rhythm with IV diltiazem use followed by PO metoprolol and IV heparin followed by Eliquis 5 mg. twice daily..  She was observed for 24 to 48 hours. With FH history of Heart disease and exersional chest pain she underwent nuclear stress test which showed normal LV systolic function and no definite reversible ischemia.  Her insulin dose was decreased by 50 %. She will see me in 1 week and her endocrinologist in 2 weeks.  She will have cutaneous glucose monitor patch for 2-4 weeks to adjust her insulin dose.  Consults: cardiology  Significant Diagnostic Studies: labs: 27 mg to 85 mg. to 188 mg.  Normal electrolytes and renal function. Hgb A1C was 9.2 %. Very good lipid level control with LDL of 45 mg.  Normal T 4 and TSH levels. CBC is near normal with mildly elevated WBC count.  EKG: Atrial fibrillation with RVR and inferior ischemia and possible anterior  infarction..  CXR: Unremarkable.  NM Myocardial. Perfusion stress test: Good LV systolic function without reversible ischemia.  Echocardiogram: Mild LVH and normal systolic function.  Treatments: cardiac meds: metoprolol and insulin: NPH.  Discharge Exam: Blood pressure 129/68, pulse 77, temperature 98.2 F (36.8 C), temperature source Oral, resp. rate 15, height 5' (1.524 m), weight 56.9 kg, SpO2 99 %. General appearance: alert, cooperative and appears stated age. Head: Normocephalic, atraumatic. Eyes: Blue eyes, pink conjunctiva, corneas clear. PERRL, EOM's intact.  Neck: No adenopathy, no carotid bruit, no JVD, supple, symmetrical, trachea midline and thyroid not enlarged. Resp: Clear to auscultation bilaterally. Cardio: Regular rate and rhythm, S1, S2 normal, II/VI systolic murmur, no click, rub or gallop. GI: Soft, non-tender; bowel sounds normal; no organomegaly. Extremities: No edema, cyanosis or clubbing. Skin: Warm and dry.  Neurologic: Alert and oriented X 3, normal strength and tone. Normal coordination and gait.  Disposition: Discharge disposition: 01-Home or Self Care        Allergies as of 05/17/2019      Reactions   Gabapentin Other (See Comments)   Caused upset stomach   Penicillins Hives, Other (See Comments)   Has patient had a PCN reaction causing immediate rash, facial/tongue/throat swelling, SOB or lightheadedness with hypotension: Unknown Has patient had a PCN reaction causing severe rash involving mucus membranes or skin necrosis: Unknown Has patient had a PCN reaction that required hospitalization: Unknown Has patient had a PCN reaction occurring within the last 10 years: No If all of the above answers are "NO", then may proceed with Cephalosporin use.     Pepcid [famotidine] Other (See Comments)   HEADACHE      Medication List    STOP taking these medications   aspirin EC 81 MG tablet     TAKE these medications   apixaban 5 MG Tabs  tablet Commonly known as: ELIQUIS Take 1 tablet (5 mg total) by mouth 2 (two) times daily.   atorvastatin 10 MG tablet Commonly known as: LIPITOR Take 10 mg by mouth daily at 6 PM.   clonazePAM 1 MG tablet Commonly known as: KLONOPIN Take 0.5 mg by mouth daily as needed for anxiety.   fexofenadine 180 MG tablet Commonly known as: ALLEGRA Take 180 mg by mouth daily as needed for allergies.   glucose blood test strip Commonly known as: OneTouch Verio CHECK BLOOD SUGAR 3 TIMES DAILY-DX-E11.9 AND Z79.4.   insulin NPH-regular Human (70-30) 100 UNIT/ML injection Inject 5-6 Units into the skin See admin instructions. Take 6 units in the morning, and 5 units at night. Hold insulin if blood sugar is less than 99 mg.  Decrease to 3 units if blood sugar is 100 to 140 mg. What changed:   how much to take  additional instructions   losartan 25 MG tablet Commonly known as: COZAAR Take 1 tablet (25 mg total) by mouth daily.   metoprolol succinate 25 MG 24 hr tablet Commonly known as: TOPROL-XL Take 1 tablet (25 mg total) by mouth daily. Start taking on: May 18, 2019   omeprazole 20 MG capsule Commonly known as: PRILOSEC Take 20 mg by mouth daily.   OneTouch Delica Lancets 33G Misc Check blood sugar 3 times daily-DX-E11.9 and Z79.4.   OneTouch Verio w/Device Kit 1 kit by Does not apply route 3 (three) times daily.   pregabalin 75 MG capsule Commonly known as: LYRICA TAKE 1 CAPSULE BY MOUTH TWICE A DAY   Systane Complete 0.6 % Soln Generic drug: Propylene Glycol Place 1 drop into both eyes 2 (two) times daily as needed (for dry eyes).   traMADol 50 MG tablet Commonly known as: Ultram Take 1 tablet (50 mg total) by mouth every 6 (six) hours as needed. What changed: reasons to take this   Vitamin D-3 125 MCG (5000 UT) Tabs Take 5,000 tablets by mouth every evening.      Follow-up Information    Kadakia, Ajay, MD. Schedule an appointment as soon as possible for a  visit in 1 week(s).   Specialty: Cardiology Contact information: [address] [place] [address] [phone]        Schoenhoff, Deborah D, MD. Schedule an appointment as soon as possible for a visit in 2 week(s).   Specialty: Internal Medicine Contact information: [address] [place] [address] [phone]           Time spent: Review of old chart, current chart, lab, x-ray, cardiac tests and discussion with patient over 60 minutes.  Signed: Ajay S Kadakia 05/17/2019, 2:51 PM   

## 2019-05-18 ENCOUNTER — Other Ambulatory Visit: Payer: PRIVATE HEALTH INSURANCE

## 2019-05-28 ENCOUNTER — Observation Stay (HOSPITAL_COMMUNITY): Payer: Medicare Other

## 2019-05-28 ENCOUNTER — Emergency Department (HOSPITAL_COMMUNITY): Payer: Medicare Other

## 2019-05-28 ENCOUNTER — Inpatient Hospital Stay (HOSPITAL_COMMUNITY)
Admission: EM | Admit: 2019-05-28 | Discharge: 2019-05-31 | DRG: 186 | Disposition: A | Discharge status: Home / Self Care | Payer: Medicare Other | Attending: Internal Medicine | Admitting: Internal Medicine

## 2019-05-28 ENCOUNTER — Encounter (HOSPITAL_COMMUNITY): Payer: Self-pay | Admitting: Emergency Medicine

## 2019-05-28 ENCOUNTER — Other Ambulatory Visit: Payer: Self-pay

## 2019-05-28 DIAGNOSIS — Z794 Long term (current) use of insulin: Secondary | ICD-10-CM

## 2019-05-28 DIAGNOSIS — R945 Abnormal results of liver function studies: Secondary | ICD-10-CM

## 2019-05-28 DIAGNOSIS — Z806 Family history of leukemia: Secondary | ICD-10-CM

## 2019-05-28 DIAGNOSIS — J9859 Other diseases of mediastinum, not elsewhere classified: Secondary | ICD-10-CM | POA: Diagnosis not present

## 2019-05-28 DIAGNOSIS — Z20828 Contact with and (suspected) exposure to other viral communicable diseases: Secondary | ICD-10-CM | POA: Diagnosis present

## 2019-05-28 DIAGNOSIS — E559 Vitamin D deficiency, unspecified: Secondary | ICD-10-CM | POA: Diagnosis present

## 2019-05-28 DIAGNOSIS — I110 Hypertensive heart disease with heart failure: Secondary | ICD-10-CM | POA: Diagnosis present

## 2019-05-28 DIAGNOSIS — Z803 Family history of malignant neoplasm of breast: Secondary | ICD-10-CM

## 2019-05-28 DIAGNOSIS — Z8052 Family history of malignant neoplasm of bladder: Secondary | ICD-10-CM

## 2019-05-28 DIAGNOSIS — Z8249 Family history of ischemic heart disease and other diseases of the circulatory system: Secondary | ICD-10-CM

## 2019-05-28 DIAGNOSIS — R7989 Other specified abnormal findings of blood chemistry: Secondary | ICD-10-CM | POA: Insufficient documentation

## 2019-05-28 DIAGNOSIS — R591 Generalized enlarged lymph nodes: Secondary | ICD-10-CM | POA: Diagnosis present

## 2019-05-28 DIAGNOSIS — R0600 Dyspnea, unspecified: Secondary | ICD-10-CM

## 2019-05-28 DIAGNOSIS — M109 Gout, unspecified: Secondary | ICD-10-CM | POA: Diagnosis present

## 2019-05-28 DIAGNOSIS — K219 Gastro-esophageal reflux disease without esophagitis: Secondary | ICD-10-CM | POA: Diagnosis present

## 2019-05-28 DIAGNOSIS — Z8541 Personal history of malignant neoplasm of cervix uteri: Secondary | ICD-10-CM

## 2019-05-28 DIAGNOSIS — Z7901 Long term (current) use of anticoagulants: Secondary | ICD-10-CM

## 2019-05-28 DIAGNOSIS — Z888 Allergy status to other drugs, medicaments and biological substances status: Secondary | ICD-10-CM

## 2019-05-28 DIAGNOSIS — Z90710 Acquired absence of both cervix and uterus: Secondary | ICD-10-CM

## 2019-05-28 DIAGNOSIS — J90 Pleural effusion, not elsewhere classified: Secondary | ICD-10-CM | POA: Diagnosis not present

## 2019-05-28 DIAGNOSIS — D72820 Lymphocytosis (symptomatic): Secondary | ICD-10-CM | POA: Diagnosis present

## 2019-05-28 DIAGNOSIS — K08409 Partial loss of teeth, unspecified cause, unspecified class: Secondary | ICD-10-CM | POA: Diagnosis present

## 2019-05-28 DIAGNOSIS — Z87891 Personal history of nicotine dependence: Secondary | ICD-10-CM

## 2019-05-28 DIAGNOSIS — Z9049 Acquired absence of other specified parts of digestive tract: Secondary | ICD-10-CM

## 2019-05-28 DIAGNOSIS — E782 Mixed hyperlipidemia: Secondary | ICD-10-CM | POA: Diagnosis present

## 2019-05-28 DIAGNOSIS — E860 Dehydration: Secondary | ICD-10-CM | POA: Diagnosis present

## 2019-05-28 DIAGNOSIS — N179 Acute kidney failure, unspecified: Secondary | ICD-10-CM | POA: Diagnosis present

## 2019-05-28 DIAGNOSIS — Z880 Allergy status to penicillin: Secondary | ICD-10-CM

## 2019-05-28 DIAGNOSIS — Z9081 Acquired absence of spleen: Secondary | ICD-10-CM

## 2019-05-28 DIAGNOSIS — I480 Paroxysmal atrial fibrillation: Secondary | ICD-10-CM | POA: Diagnosis present

## 2019-05-28 DIAGNOSIS — Z79899 Other long term (current) drug therapy: Secondary | ICD-10-CM

## 2019-05-28 DIAGNOSIS — I5032 Chronic diastolic (congestive) heart failure: Secondary | ICD-10-CM | POA: Diagnosis present

## 2019-05-28 LAB — PROCALCITONIN: Procalcitonin: <0.1 ng/mL

## 2019-05-28 LAB — PHOSPHORUS: Phosphorus: 2.6 mg/dL (ref 2.5–4.6)

## 2019-05-28 LAB — CBC WITH DIFFERENTIAL/PLATELET
Abs Immature Granulocytes: 0.08 10*3/uL — ABNORMAL HIGH (ref 0.00–0.07)
Basophils Absolute: 0.1 10*3/uL (ref 0.0–0.1)
Basophils Relative: 1 %
Eosinophils Absolute: 0 10*3/uL (ref 0.0–0.5)
Eosinophils Relative: 0 %
HCT: 31.6 % — ABNORMAL LOW (ref 36.0–46.0)
Hemoglobin: 10.3 g/dL — ABNORMAL LOW (ref 12.0–15.0)
Immature Granulocytes: 1 %
Lymphocytes Relative: 11 %
Lymphs Abs: 1.7 10*3/uL (ref 0.7–4.0)
MCH: 25.7 pg — ABNORMAL LOW (ref 26.0–34.0)
MCHC: 32.6 g/dL (ref 30.0–36.0)
MCV: 78.8 fL — ABNORMAL LOW (ref 80.0–100.0)
Monocytes Absolute: 1.8 10*3/uL — ABNORMAL HIGH (ref 0.1–1.0)
Monocytes Relative: 12 %
Neutro Abs: 11.3 10*3/uL — ABNORMAL HIGH (ref 1.7–7.7)
Neutrophils Relative %: 75 %
Platelets: 593 10*3/uL — ABNORMAL HIGH (ref 150–400)
RBC: 4.01 MIL/uL (ref 3.87–5.11)
RDW: 14.5 % (ref 11.5–15.5)
WBC: 14.9 10*3/uL — ABNORMAL HIGH (ref 4.0–10.5)
nRBC: 0 % (ref 0.0–0.2)

## 2019-05-28 LAB — COMPREHENSIVE METABOLIC PANEL
ALT: 23 U/L (ref 0–44)
AST: 18 U/L (ref 15–41)
Albumin: 2.6 g/dL — ABNORMAL LOW (ref 3.5–5.0)
Alkaline Phosphatase: 347 U/L — ABNORMAL HIGH (ref 38–126)
Anion gap: 15 (ref 5–15)
BUN: 23 mg/dL (ref 8–23)
CO2: 22 mmol/L (ref 22–32)
Calcium: 8.9 mg/dL (ref 8.9–10.3)
Chloride: 95 mmol/L — ABNORMAL LOW (ref 98–111)
Creatinine, Ser: 1.18 mg/dL — ABNORMAL HIGH (ref 0.44–1.00)
GFR calc Af Amer: 56 mL/min — ABNORMAL LOW (ref >60)
GFR calc non Af Amer: 48 mL/min — ABNORMAL LOW (ref >60)
Glucose, Bld: 264 mg/dL — ABNORMAL HIGH (ref 70–99)
Potassium: 4 mmol/L (ref 3.5–5.1)
Sodium: 132 mmol/L — ABNORMAL LOW (ref 135–145)
Total Bilirubin: 1.4 mg/dL — ABNORMAL HIGH (ref 0.3–1.2)
Total Protein: 6.7 g/dL (ref 6.5–8.1)

## 2019-05-28 LAB — TSH: TSH: 0.663 u[IU]/mL (ref 0.350–4.500)

## 2019-05-28 LAB — TROPONIN I (HIGH SENSITIVITY)
Troponin I (High Sensitivity): 15 ng/L (ref <18)
Troponin I (High Sensitivity): 16 ng/L (ref <18)
Troponin I (High Sensitivity): 16 ng/L (ref <18)

## 2019-05-28 LAB — CBC
HCT: 32.9 % — ABNORMAL LOW (ref 36.0–46.0)
Hemoglobin: 10.6 g/dL — ABNORMAL LOW (ref 12.0–15.0)
MCH: 25.9 pg — ABNORMAL LOW (ref 26.0–34.0)
MCHC: 32.2 g/dL (ref 30.0–36.0)
MCV: 80.2 fL (ref 80.0–100.0)
Platelets: 623 10*3/uL — ABNORMAL HIGH (ref 150–400)
RBC: 4.1 MIL/uL (ref 3.87–5.11)
RDW: 14.6 % (ref 11.5–15.5)
WBC: 14.5 10*3/uL — ABNORMAL HIGH (ref 4.0–10.5)
nRBC: 0 % (ref 0.0–0.2)

## 2019-05-28 LAB — GLUCOSE, CAPILLARY
Glucose-Capillary: 231 mg/dL — ABNORMAL HIGH (ref 70–99)
Glucose-Capillary: 178 mg/dL — ABNORMAL HIGH (ref 70–99)

## 2019-05-28 LAB — BRAIN NATRIURETIC PEPTIDE: B Natriuretic Peptide: 241.6 pg/mL — ABNORMAL HIGH (ref 0.0–100.0)

## 2019-05-28 LAB — HEMOGLOBIN A1C
Hgb A1c MFr Bld: 9 % — ABNORMAL HIGH (ref 4.8–5.6)
Mean Plasma Glucose: 211.6 mg/dL

## 2019-05-28 LAB — MAGNESIUM: Magnesium: 2.1 mg/dL (ref 1.7–2.4)

## 2019-05-28 LAB — PROTIME-INR
INR: 2 — ABNORMAL HIGH (ref 0.8–1.2)
Prothrombin Time: 22.8 seconds — ABNORMAL HIGH (ref 11.4–15.2)

## 2019-05-28 LAB — LACTATE DEHYDROGENASE: LDH: 227 U/L — ABNORMAL HIGH (ref 98–192)

## 2019-05-28 LAB — LACTIC ACID, PLASMA
Lactic Acid, Venous: 1 mmol/L (ref 0.5–1.9)
Lactic Acid, Venous: 1.2 mmol/L (ref 0.5–1.9)

## 2019-05-28 LAB — SARS CORONAVIRUS 2 BY RT PCR (HOSPITAL ORDER, PERFORMED IN ~~LOC~~ HOSPITAL LAB): SARS Coronavirus 2: NEGATIVE

## 2019-05-28 LAB — SEDIMENTATION RATE: Sed Rate: 80 mm/hr — ABNORMAL HIGH (ref 0–22)

## 2019-05-28 MED ORDER — INSULIN ASPART 100 UNIT/ML ~~LOC~~ SOLN
0.0000 [IU] | Freq: Three times a day (TID) | SUBCUTANEOUS | Status: DC
  Administered 2019-05-28: 5 [IU] via SUBCUTANEOUS
  Administered 2019-05-29: 2 [IU] via SUBCUTANEOUS
  Administered 2019-05-29: 3 [IU] via SUBCUTANEOUS
  Administered 2019-05-29: 5 [IU] via SUBCUTANEOUS
  Administered 2019-05-30: 8 [IU] via SUBCUTANEOUS
  Administered 2019-05-30: 5 [IU] via SUBCUTANEOUS
  Administered 2019-05-30: 13:00:00 2 [IU] via SUBCUTANEOUS
  Administered 2019-05-31: 13:00:00 5 [IU] via SUBCUTANEOUS
  Administered 2019-05-31: 09:00:00 8 [IU] via SUBCUTANEOUS

## 2019-05-28 MED ORDER — CLONAZEPAM 0.5 MG PO TABS: 0.5000 mg | ORAL_TABLET | Freq: Every day | ORAL | Status: DC | PRN

## 2019-05-28 MED ORDER — SODIUM CHLORIDE 0.9% FLUSH
3.0000 mL | Freq: Two times a day (BID) | INTRAVENOUS | Status: DC
  Administered 2019-05-30 (×3): 3 mL via INTRAVENOUS

## 2019-05-28 MED ORDER — SODIUM CHLORIDE 0.9 % IV BOLUS
500.0000 mL | Freq: Once | INTRAVENOUS | Status: AC
  Administered 2019-05-28: 500 mL via INTRAVENOUS

## 2019-05-28 MED ORDER — ONDANSETRON HCL 4 MG/2ML IJ SOLN
4.0000 mg | Freq: Once | INTRAMUSCULAR | Status: AC
  Administered 2019-05-28: 4 mg via INTRAVENOUS
  Filled 2019-05-28: qty 2

## 2019-05-28 MED ORDER — ACETAMINOPHEN 650 MG RE SUPP: 650.0000 mg | Freq: Four times a day (QID) | RECTAL | Status: DC | PRN

## 2019-05-28 MED ORDER — PREGABALIN 75 MG PO CAPS
75.0000 mg | ORAL_CAPSULE | Freq: Two times a day (BID) | ORAL | Status: DC
  Administered 2019-05-28 – 2019-05-31 (×6): 75 mg via ORAL
  Filled 2019-05-28 (×6): qty 1

## 2019-05-28 MED ORDER — SODIUM CHLORIDE 0.9 % IV SOLN
2.0000 g | Freq: Once | INTRAVENOUS | Status: AC
  Administered 2019-05-28: 2 g via INTRAVENOUS
  Filled 2019-05-28 (×2): qty 2

## 2019-05-28 MED ORDER — ONDANSETRON HCL 4 MG PO TABS: 4.0000 mg | ORAL_TABLET | Freq: Four times a day (QID) | ORAL | Status: DC | PRN

## 2019-05-28 MED ORDER — METOPROLOL SUCCINATE ER 25 MG PO TB24
25.0000 mg | ORAL_TABLET | Freq: Every day | ORAL | Status: DC
  Administered 2019-05-28 – 2019-05-31 (×4): 25 mg via ORAL
  Filled 2019-05-28 (×4): qty 1

## 2019-05-28 MED ORDER — FUROSEMIDE 10 MG/ML IJ SOLN
40.0000 mg | Freq: Once | INTRAMUSCULAR | Status: AC
  Administered 2019-05-28: 40 mg via INTRAVENOUS
  Filled 2019-05-28: qty 4

## 2019-05-28 MED ORDER — IOHEXOL 350 MG/ML SOLN
100.0000 mL | Freq: Once | INTRAVENOUS | Status: AC | PRN
  Administered 2019-05-28: 10:00:00 100 mL via INTRAVENOUS

## 2019-05-28 MED ORDER — ATORVASTATIN CALCIUM 10 MG PO TABS
10.0000 mg | ORAL_TABLET | Freq: Every day | ORAL | Status: DC
  Administered 2019-05-28 – 2019-05-30 (×4): 10 mg via ORAL
  Filled 2019-05-28 (×4): qty 1

## 2019-05-28 MED ORDER — SODIUM CHLORIDE 0.9 % IV SOLN
2.0000 g | Freq: Two times a day (BID) | INTRAVENOUS | Status: DC
  Administered 2019-05-29 – 2019-05-31 (×5): 2 g via INTRAVENOUS
  Filled 2019-05-28 (×6): qty 2

## 2019-05-28 MED ORDER — VANCOMYCIN HCL 10 G IV SOLR: 1250.0000 mg | INTRAVENOUS | Status: DC

## 2019-05-28 MED ORDER — PANTOPRAZOLE SODIUM 40 MG PO TBEC
40.0000 mg | DELAYED_RELEASE_TABLET | Freq: Every day | ORAL | Status: DC
  Administered 2019-05-28 – 2019-05-31 (×4): 40 mg via ORAL
  Filled 2019-05-28 (×4): qty 1

## 2019-05-28 MED ORDER — ACETAMINOPHEN 325 MG PO TABS
650.0000 mg | ORAL_TABLET | Freq: Once | ORAL | Status: AC
  Administered 2019-05-28: 11:00:00 650 mg via ORAL
  Filled 2019-05-28: qty 2

## 2019-05-28 MED ORDER — SODIUM CHLORIDE 0.9 % IV SOLN
INTRAVENOUS | Status: DC | PRN
  Administered 2019-05-28 – 2019-05-29 (×2): via INTRAVENOUS

## 2019-05-28 MED ORDER — ONDANSETRON HCL 4 MG/2ML IJ SOLN: 4.0000 mg | Freq: Four times a day (QID) | INTRAMUSCULAR | Status: DC | PRN

## 2019-05-28 MED ORDER — INSULIN ASPART 100 UNIT/ML ~~LOC~~ SOLN
0.0000 [IU] | Freq: Every day | SUBCUTANEOUS | Status: DC
  Administered 2019-05-29: 2 [IU] via SUBCUTANEOUS

## 2019-05-28 MED ORDER — VANCOMYCIN HCL IN DEXTROSE 1-5 GM/200ML-% IV SOLN
1000.0000 mg | Freq: Once | INTRAVENOUS | Status: AC
  Administered 2019-05-28: 1000 mg via INTRAVENOUS
  Filled 2019-05-28: qty 200

## 2019-05-28 MED ORDER — ACETAMINOPHEN 325 MG PO TABS
650.0000 mg | ORAL_TABLET | Freq: Four times a day (QID) | ORAL | Status: DC | PRN
  Administered 2019-05-29 – 2019-05-31 (×7): 650 mg via ORAL
  Filled 2019-05-28 (×8): qty 2

## 2019-05-28 NOTE — ED Notes (Signed)
Attempted to call report x2. Left phone number with NS.

## 2019-05-28 NOTE — ED Notes (Signed)
Patient returned from CT

## 2019-05-28 NOTE — ED Triage Notes (Signed)
Pt brought to ED by [facility] home for c/o cp, sob, nausea, since yesterday 1 pm, afib on the monitor on eliquis, BP 162/67, P-88, R-20, SPO2 100% RA. CBG 200. 324 mg ASA given by [facility] pta.

## 2019-05-28 NOTE — H&P (Signed)
°History and Physical  ° ° °[name] MRN:[id number] DOB: 07/18/1956 DOA: 05/28/2019 ° °PCP: Varadarajan, Rupashree, MD  °Patient coming from: Home °I have personally briefly reviewed patient's old medical records in [place] [facility] ° °Chief Complaint: Chest pain and shortness of breath ° °HPI: [name] is a 63 y.o. female with medical history significant of hypertension, hyperlipidemia, diabetes mellitus, A. fib, GERD, gout, chronic lymphocytosis, history of cervical cancer presents to emergency department with complaint of chest pain and shortness of breath which started this morning.  Reports that her chest pain is pressure-like, central, nonradiating, no aggravating or relieving factors, associated with shortness of breath, nausea and vomiting.  Denies orthopnea, PND, leg swelling, fever, chills, cough, wheezing, headache, blurry vision, lightheadedness, dizziness, abdominal pain, dysuria, sleep changes or bowel changes.   ° °She admitted here at [facility] 1-1/2 weeks ago with syncope secondary to hypoglycemic episode and new onset A. fib.  She discharged on Eliquis and advised to follow-up with cardiologist and endocrinologist outpatient.  She also had left upper and lower teeth extraction 2 weeks ago-followed up with her dentist and finished antibiotics as prescribed. 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  Has chronic lymphocytosis and inguinal lymphadenopathy-biopsy was done in  9/19 which came back benign.  Has family history of leukemia on her mom's side.  She denies decreased appetite, weight loss or night sweats. ° °ED Course: Lying comfortably on the bed, husband at bedside.  On 2 L of oxygen via nasal cannula maintaining oxygen saturation in 95- 97%.  COVID-19 came back negative.  Chest x-ray shows right-sided pleural effusion, CT angiogram revealed mediastinal mass and bilateral pleural effusion right more than left.  She received 1 dose of IV Lasix in the ED.    ° °Review of Systems: As per HPI otherwise negative.  ° ° °Past Medical History:  °Diagnosis Date  °• Cancer (HCC) 1977  ° CERVICAL  °• GERD (gastroesophageal reflux disease) 11/14/2013  °• Gout   °• Mixed hyperlipidemia 11/14/2013  ° ° °Past Surgical History:  °Procedure Laterality Date  °• ABDOMINAL HYSTERECTOMY  1977  ° CERVICAL CA  °• CHOLECYSTECTOMY    °• EYE SURGERY    ° 6/3 LASER FOR GLAUCOMA, 9/03 CE/IOL IMPLANTS  °• INGUINAL LYMPH NODE BIOPSY Right 06/08/2018  ° Procedure: RIGHT INGUINAL LYMPH NODE EXCISIONAL BIOPSY;  Surgeon: Connor, Chelsea A, MD;  Location: [facility];  Service: General;  Laterality: Right;  °• pancreatectomy    °• SHOULDER CLOSED REDUCTION Left 02/03/2013  ° Procedure: CLOSED MANIPULATION SHOULDER;  Surgeon: Steve Lucey, MD;  Location: [facility];  Service: Orthopedics;  Laterality: Left;  CLOSED MANIPULATION LEFT SHOULDER  °• SPLENECTOMY    °• TONSILLECTOMY  1970  ° ° ° reports that she quit smoking about 29 years ago. She has never used smokeless tobacco. She reports that she does not drink alcohol or use drugs. ° °Allergies  °Allergen Reactions  °• Gabapentin Other (See Comments)  °  Caused upset stomach  °• Penicillins Hives and Other (See Comments)  °  Has patient had a PCN reaction causing immediate rash, facial/tongue/throat swelling, SOB or lightheadedness with hypotension: Unknown °Has patient had a PCN reaction causing severe rash involving mucus membranes or skin necrosis: Unknown °Has patient had a PCN reaction that required hospitalization: Unknown °Has patient had a PCN reaction occurring within the last 10 years: No °If all of the above answers are "NO", then may proceed with Cephalosporin   Cephalosporin use.    Pepcid [Famotidine] Other (See Comments)    HEADACHE    Family History  Problem Relation Age of Onset   Dementia Mother    Heart disease Mother    Hypertension Mother    COPD Mother    Breast cancer Mother 82   Heart disease Father    Diabetes Father    Glaucoma  Father    Cancer Father        BLADDER   Depression Sister     Prior to Admission medications   Medication Sig Start Date End Date Taking? Authorizing Provider  apixaban (ELIQUIS) 5 MG TABS tablet Take 1 tablet (5 mg total) by mouth 2 (two) times daily. 05/17/19   Dixie Dials, MD  atorvastatin (LIPITOR) 10 MG tablet Take 10 mg by mouth daily at 6 PM.  02/14/19   [provider]  Blood Glucose Monitoring Suppl (ONETOUCH VERIO) w/Device KIT 1 kit by Does not apply route 3 (three) times daily. 12/24/16   Unk Pinto, MD  Cholecalciferol (VITAMIN D-3) 5000 units TABS Take 5,000 tablets by mouth every evening.    [provider]  clonazePAM (KLONOPIN) 1 MG tablet Take 0.5 mg by mouth daily as needed for anxiety. 05/27/18   [provider]  fexofenadine (ALLEGRA) 180 MG tablet Take 180 mg by mouth daily as needed for allergies.     [provider]  glucose blood (ONETOUCH VERIO) test strip CHECK BLOOD SUGAR 3 TIMES DAILY-DX-E11.9 AND Z79.4. 12/24/16   Unk Pinto, MD  insulin NPH-regular Human (70-30) 100 UNIT/ML injection Inject 5-6 Units into the skin See admin instructions. Take 6 units in the morning, and 5 units at night. Hold insulin if blood sugar is less than 99 mg.  Decrease to 3 units if blood sugar is 100 to 140 mg. 05/17/19   Dixie Dials, MD  losartan (COZAAR) 25 MG tablet Take 1 tablet (25 mg total) by mouth daily. 05/17/19   Dixie Dials, MD  metoprolol succinate (TOPROL-XL) 25 MG 24 hr tablet Take 1 tablet (25 mg total) by mouth daily. 05/18/19   Dixie Dials, MD  omeprazole (PRILOSEC) 20 MG capsule Take 20 mg by mouth daily.    [provider]  Jonetta Speak LANCETS 13Y MISC Check blood sugar 3 times daily-DX-E11.9 and Z79.4. 01/23/17   Unk Pinto, MD  pregabalin (LYRICA) 75 MG capsule TAKE 1 CAPSULE BY MOUTH TWICE A DAY Patient taking differently: Take 75 mg by mouth 2 (two) times daily.  12/31/18   Trula Slade, DPM   Propylene Glycol (SYSTANE COMPLETE) 0.6 % SOLN Place 1 drop into both eyes 2 (two) times daily as needed (for dry eyes).    [provider]  traMADol (ULTRAM) 50 MG tablet Take 1 tablet (50 mg total) by mouth every 6 (six) hours as needed. Patient taking differently: Take 50 mg by mouth every 6 (six) hours as needed for moderate pain.  06/08/18 06/08/19  Clovis Riley, MD    Physical Exam: Vitals:   05/28/19 1200 05/28/19 1230 05/28/19 1300 05/28/19 1315  BP: (!) 159/66 (!) 146/63 (!) 137/50 (!) 155/68  Pulse: 87 83 86 84  Resp: _0 Temp:      TempSrc:      SpO2: 99% 98% 98% 99%    Constitutional: NAD, calm, comfortable Vitals:   05/28/19 1200 05/28/19 1230 05/28/19 1300 05/28/19 1315  BP: (!) 159/66 (!) 146/63 (!) 137/50 (!) 155/68  Pulse: 87 83  84  °Resp: 12 16 19   °Temp:      °TempSrc:      °SpO2: 99% 98% 98% 99%  ° °General: Lying comfortably, communicating well, not in acute distress on 2 L of oxygen via nasal cannula. °Eyes: PERRL, lids and conjunctivae normal °ENMT: Mucous membranes are moist. Posterior pharynx clear of any exudate or lesions.Normal dentition.  °Neck: normal, supple, no masses, no thyromegaly °Respiratory: clear to auscultation bilaterally, no wheezing, no crackles. Normal respiratory effort. No accessory muscle use.  °Cardiovascular: Regular rate and rhythm, no murmurs / rubs / gallops. No extremity edema. 2+ pedal pulses. No carotid bruits.  °Abdomen: no tenderness, no masses palpated. No hepatosplenomegaly. Bowel sounds positive.  °Musculoskeletal: no clubbing / cyanosis. No joint deformity upper and lower extremities. Good ROM, no contractures. Normal muscle tone.  °Skin: no rashes, lesions, ulcers. No induration °Neurologic: CN 2-12 grossly intact. Sensation intact, DTR normal. Strength 5/5 in all 4.  °Psychiatric: Normal judgment and insight. Alert and oriented x 3. Normal mood.  ° ° °Labs on Admission: I have personally reviewed following  labs and imaging studies ° °CBC: °Recent Labs  °Lab 05/28/19 °0833 05/28/19 °1204  °WBC 14.5* 14.9*  °NEUTROABS  --  11.3*  °HGB 10.6* 10.3*  °HCT 32.9* 31.6*  °MCV 80.2 78.8*  °PLT 623* 593*  ° °Basic Metabolic Panel: °Recent Labs  °Lab 05/28/19 °0833  °NA 132*  °K 4.0  °CL 95*  °CO2 22  °GLUCOSE 264*  °BUN 23  °CREATININE 1.18*  °CALCIUM 8.9  ° °GFR: °Estimated Creatinine Clearance: 37.6 mL/min (A) (by C-G formula based on SCr of 1.18 mg/dL (H)). °Liver Function Tests: °Recent Labs  °Lab 05/28/19 °0833  °AST 18  °ALT 23  °ALKPHOS 347*  °BILITOT 1.4*  °PROT 6.7  °ALBUMIN 2.6*  ° °No results for input(s): LIPASE, AMYLASE in the last 168 hours. °No results for input(s): AMMONIA in the last 168 hours. °Coagulation Profile: °Recent Labs  °Lab 05/28/19 °0833  °INR 2.0*  ° °Cardiac Enzymes: °No results for input(s): CKTOTAL, CKMB, CKMBINDEX, TROPONINI in the last 168 hours. °BNP (last 3 results) °No results for input(s): PROBNP in the last 8760 hours. °HbA1C: °No results for input(s): HGBA1C in the last 72 hours. °CBG: °No results for input(s): GLUCAP in the last 168 hours. °Lipid Profile: °No results for input(s): CHOL, HDL, LDLCALC, TRIG, CHOLHDL, LDLDIRECT in the last 72 hours. °Thyroid Function Tests: °No results for input(s): TSH, T4TOTAL, FREET4, T3FREE, THYROIDAB in the last 72 hours. °Anemia Panel: °No results for input(s): VITAMINB12, FOLATE, FERRITIN, TIBC, IRON, RETICCTPCT in the last 72 hours. °Urine analysis: °   °Component Value Date/Time  ° COLORURINE STRAW (A) 05/15/2019 0736  ° APPEARANCEUR CLEAR 05/15/2019 0736  ° LABSPEC 1.003 (L) 05/15/2019 0736  ° PHURINE 8.0 05/15/2019 0736  ° GLUCOSEU 50 (A) 05/15/2019 0736  ° HGBUR SMALL (A) 05/15/2019 0736  ° BILIRUBINUR NEGATIVE 05/15/2019 0736  ° KETONESUR NEGATIVE 05/15/2019 0736  ° PROTEINUR 30 (A) 05/15/2019 0736  ° UROBILINOGEN 0.2 12/07/2011 1031  ° NITRITE NEGATIVE 05/15/2019 0736  ° LEUKOCYTESUR NEGATIVE 05/15/2019 0736  ° ° °Radiological Exams on  Admission: °Ct Angio Chest Pe W And/or Wo Contrast ° °Result Date: 05/28/2019 °CLINICAL DATA:  Short of breath, nausea. EXAM: CT ANGIOGRAPHY CHEST WITH CONTRAST TECHNIQUE: Multidetector CT imaging of the chest was performed using the standard protocol during bolus administration of intravenous contrast. Multiplanar CT image reconstructions and MIPs were obtained to evaluate the vascular anatomy. CONTRAST:  100mL   OMNIPAQUE IOHEXOL 350 MG/ML SOLN COMPARISON:  Neck CT 04/02/2018 FINDINGS: Cardiovascular: No filling defects within the pulmonary arteries to suggest acute pulmonary embolism. No acute findings of the aorta or great vessels. No pericardial fluid. Mediastinum/Nodes: No axillary lymphadenopathy. There is ill-defined soft tissue thickening in the anterior mediastinum measuring 2.5 cm in thickness (image 39/5)a. This ill-defined tissue extends into the LEFT supraclavicular space (image 22/5. The tissue planes about the esophagus or poorly defined particularly inferior to the carina. No pericardial effusion Lungs/Pleura: There is a moderate effusion on the RIGHT and small effusion on the LEFT. There is interlobular septal thickening in lower lobes. Motion degradation of the imaging. No clear nodularity. Upper Abdomen: Limited view of the liver, kidneys, pancreas are unremarkable. Normal adrenal glands. Musculoskeletal: No aggressive osseous lesion. Review of the MIP images confirms the above findings. IMPRESSION: 1. Ill-defined soft tissue thickening in the anterior mediastinum extending into the LEFT supraclavicular clavicular nodal station. Concern for lymphoproliferative process or thymic origin neoplasm. Findings are new from comparison neck CT 2019. Consider FDG PET scan to evaluate malignant potential. LEFT supraclavicular tissue may be amenable to biopsy. 2. Bilateral pleural effusions RIGHT greater than LEFT. RIGHT effusion is moderate. 3. Mild interstitial edema. 4. No evidence of acute pulmonary  embolism. Electronically Signed   By: Stewart  Edmunds M.D.   On: 05/28/2019 10:57  ° °Dg Chest Port 1 View ° °Result Date: 05/28/2019 °CLINICAL DATA:  Chest pain and dyspnea EXAM: PORTABLE CHEST 1 VIEW COMPARISON:  05/15/2019 FINDINGS: Cardiac shadows within normal limits. Small right pleural effusion is noted. Mild bibasilar atelectasis is seen. No bony abnormality is noted. IMPRESSION: Right pleural effusion. Electronically Signed   By: Mark  Lukens M.D.   On: 05/28/2019 07:46  ° ° °EKG: Independently reviewed.  Normal sinus rhythm, no acute ST-T wave changes noted. ° °Assessment/Plan °Active Problems: °  Mediastinal mass °  ° °Anterior mediastinal mass: °-Extending into left supraclavicular region.  Never smoked cigarettes. °-extending into left supraclavicular region-never smoker °-Unknown etiology-could be secondary to underlying lymphoproliferative process versus infection? °-Patient has leukocytosis, thrombocytosis and lymphocytosis, had fever of 100.5 at the time of arrival in the ED. history of splenectomy, chronic lymphocytosis and inguinal lymphadenopathy-biopsy came back benign. °-Reviewed chest x-ray, CT angiogram result °-Started on broad-spectrum antibiotics IV vancomycin and cefepime °-Blood culture obtained and is pending °-COVID-19 came back negative. °-Ordered magnesium, phosphorus, proBNP, sed rate, CRP, LDH, procalcitonin level, HIV, TSH, lactic acid level. °-We will consult pulmonology for possible thoracentesis for further evaluation and management of underlying cause. ° °Bilateral pleural effusion: (R>L) °-Unknown etiology.  On 2 L of oxygen via nasal cannula currently.,  Continuous pulse ox. °-Received Lasix 1 dose in ED. °-Monitor input and output and daily weight °-Incentive spirometry,.  Chest x-ray tomorrow a.m. °-Need pleural tap for further evaluation of underlying disease. ° °AKI: °-Likely secondary to dehydration °-Continue IV fluids °-Repeat BMP tomorrow, hold losartan for  now ° °Elevated liver enzymes: °-We will check acute hepatitis panel and ultrasound abdomen for further evaluation °-Monitor CMP tomorrow a.m. ° °Diabetes mellitus: We will check A1c today and start patient on sliding scale insulin. °-Monitor blood sugar closely. ° °New onset A. fib: Stable °-On cardiac monitor °-Continue metoprolol. ° °DVT prophylaxis: On Eliquis at home.  Will hold for now due to thoracentesis tomorrow. °Code Status: Full code. °Family Communication: Patient's husband  present at bedside.  Plan of care discussed with patient and her husband in length and he verbalized understanding and agreed with   with it. Disposition Plan: home Consults called: Pulmonologist Dr. Kara Mead  admission status: Observation   Mckinley Jewel MD [facility] Pager [phone]  If 7PM-7AM, please contact night-coverage [email or web address] Password [email or web address]  05/28/2019, 1:54 PM

## 2019-05-28 NOTE — ED Notes (Signed)
Patient transported to CT 

## 2019-05-28 NOTE — Progress Notes (Signed)
Pharmacy Antibiotic Note  [name] is a 63 y.o. female admitted on 05/28/2019 with shortness of breath.  Pharmacy has been consulted for vancomycin and cefepime dosing. Pt with low grade fever of 100.5 and WBC is elevated at 14.9. SCr is 1.18. Pt was recently discharged 8/24.   Plan: Vancomycin 1gm IV x 1 then 1250mg  IV Q48H Cefepime 2gm IV Q12H F/u renal fxn, C&S, clinical status and peak/trough at SS    Temp (24hrs), Avg:99.5 F (37.5 C), Min:98.4 F (36.9 C), Max:100.5 F (38.1 C)  Recent Labs  Lab 05/28/19 0833 05/28/19 1204  WBC 14.5* 14.9*  CREATININE 1.18*  --     Estimated Creatinine Clearance: 37.6 mL/min (A) (by C-G formula based on SCr of 1.18 mg/dL (H)).    Allergies  Allergen Reactions  . Gabapentin Other (See Comments)    Caused upset stomach  . Penicillins Hives and Other (See Comments)    Has patient had a PCN reaction causing immediate rash, facial/tongue/throat swelling, SOB or lightheadedness with hypotension: Unknown Has patient had a PCN reaction causing severe rash involving mucus membranes or skin necrosis: Unknown Has patient had a PCN reaction that required hospitalization: Unknown Has patient had a PCN reaction occurring within the last 10 years: No If all of the above answers are "NO", then may proceed with Cephalosporin use.   Marland Kitchen Pepcid [Famotidine] Other (See Comments)    HEADACHE    Antimicrobials this admission: Vanc 9/4>> Cefepime 9/4>>  Dose adjustments this admission: N/A  Microbiology results: Pending  Thank you for allowing pharmacy to be a part of this patient's care.  Ovie Eastep, Rande Lawman 05/28/2019 1:15 PM

## 2019-05-28 NOTE — Consult Note (Signed)
NAME:  [name], MRN:  [id number], DOB:  12-17-1955, LOS: 0 ADMISSION DATE:  05/28/2019, CONSULTATION DATE: 9/4 REFERRING MD:  Doristine Bosworth, CHIEF COMPLAINT:  Pleural effusion   Brief History   63 year old female patient with right greater than left pleural effusion   History of present illness   63 year old female patient who was recently discharged from the hospital after syncopal episode.  She apparently had had several teeth removed, had significant pain and discomfort and was not taking in p.o. intake.  Her blood glucose was 27 with altered sensorium resulting in a fall.  She was admitted, was found to have new onset atrial fibrillation, started on rate control, as well as Eliquis 5 mg p.o. twice daily she was observed for 48 hours after blood glucose was regulated and discharged home on 8/24. She presents once again to the emergency room on 9/4 with chief complaint of shortness of breath.  She was in her usual state of health with the exception of some degree of weakness ever since her hospital discharge.  She did recently go to her dentist for her suture removal on 9/2 apparently at that point he felt as though she may have had some postnasal drainage and was worried about a sinus infection and therefore placed her on a azithromycin. On 9/3 in the early afternoon she had rather sudden onset of chest discomfort which she described as an elephant sitting on her chest.  It was associated with diaphoresis and mild shortness of breath.  Not long after that she began to have intermittent episodes of nausea and retching.  She tried to take it easy for the rest of the day, did not eat much, about 4 AM she awoke with worsening shortness of breath on 9/4 and therefore presented to the emergency room. She denied fever, chills, recent cough, wheezing, other chest pain.  No heart palpitations, no lower extremity swelling.  In the emergency room she was found to have a mild leukocytosis, low-grade fever, and  chest x-ray with question of pleural effusion.  She later underwent a CT of chest which was negative for pulmonary emboli but did demonstrate a mediastinal mass as well as bilateral right greater than left pleural effusions.  She was placed on antibiotics Given supplemental oxygen and IV Lasix Pulmonary asked to evaluate for new pleural effusion  Past Medical History  Atrial fibrillation, new onset.  Hypertension, type 2 diabetes, hyperlipidemia, recent syncope  Significant Hospital Events     Consults:  pulm 9/4  IR consult   Procedures:    Significant Diagnostic Tests:   CT chest 9/4:1. Ill-defined soft tissue thickening in the anterior mediastinum extending into the LEFT supraclavicular clavicular nodal station. Concern for lymphoproliferative process or thymic origin neoplasm. Findings are new from comparison neck CT 2019. Consider FDG PET scan to evaluate malignant potential. LEFT supraclavicular tissue may be amenable to biopsy. 2. Bilateral pleural effusions RIGHT greater than LEFT. RIGHT effusion is moderate. 3. Mild interstitial edema. 4. No evidence of acute pulmonary embolism.  Echo 8/23: 1. The left ventricle has normal systolic function with an ejection fraction of 60-65%. The cavity size was normal. There is mild concentric left ventricular hypertrophy. Left ventricular diastolic Doppler parameters are consistent with impaired  Relaxation. 2. The right ventricle has normal systolic function. The cavity was normal. There is no increase in right ventricular wall thickness. Right ventricular systolic pressure is normal.  3. The mitral valve is degenerative. Mild thickening of the mitral valve leaflet. Mild  calcification of the mitral valve leaflet. There is mild mitral annular calcification present.  4. The aortic valve is tricuspid. Mild thickening of the aortic valve. Mild calcification of the aortic valve. Aortic valve regurgitation was not assessed by color flow Doppler.   5. There is evidence of moderate plaque in the ascending aorta and aortic root. Micro Data:  covid 9/4: neg   Antimicrobials:  Vancomycin 9/4 Cefepime 9/4  Interim history/subjective:  Feels a little better since being here in the emergency room  Objective   Blood pressure (Abnormal) 106/52, pulse 83, temperature (Abnormal) 100.5 F (38.1 C), temperature source Rectal, resp. rate 14, SpO2 98 %.       No intake or output data in the 24 hours ending 05/28/19 1535 There were no vitals filed for this visit.  Examination: General: 63 year old white female currently resting comfortably in bed she is able to speak full sentences without shortness of breath HENT: Normocephalic atraumatic no jugular venous distention Lungs: Clear, diminished significantly on the right with dull percussion approximately one half the way up chest wall Cardiovascular: Regular rate and rhythm, currently normal sinus rhythm on telemetry Abdomen: Soft, not tender, no organomegaly Extremities: Warm and dry brisk capillary refill no edema Neuro: Awake and oriented GU: Voids  Resolved Hospital Problem list     Assessment & Plan:   Right greater than left pleural effusion -Etiology unclear.  Bilateral effusion with recent onset of chest pressure as well as history of atrial fib with RVR raises primary suspicion negative cardiac etiology in nature. -Did have low-grade fever, recently had teeth extracted and possible sinus infection could raise question about possible infection but also consider possible associated malignancy given presence of mediastinal mass and history of chronic lymphocytosis Plan Hold Eliquis overnight We will ask interventional radiology to perform thoracentesis, anticoagulation can be resumed once thoracentesis completed Would send complete pleural fluid analysis including cytology  Fever and leukocytosis -No clear pneumonia on chest x-ray Plan Antibiotics per primary team  Anterior  mediastinal mass Plan Outpatient PET scan on follow-up If positive would need IR consult for biopsy  History of atrial fibrillation Plan Rate control Holding anticoagulation for now    Labs   CBC: Recent Labs  Lab 05/28/19 0833 05/28/19 1204  WBC 14.5* 14.9*  NEUTROABS  --  11.3*  HGB 10.6* 10.3*  HCT 32.9* 31.6*  MCV 80.2 78.8*  PLT 623* 593*    Basic Metabolic Panel: Recent Labs  Lab 05/28/19 0833 05/28/19 1251  NA 132*  --   K 4.0  --   CL 95*  --   CO2 22  --   GLUCOSE 264*  --   BUN 23  --   CREATININE 1.18*  --   CALCIUM 8.9  --   MG  --  2.1  PHOS  --  2.6   GFR: Estimated Creatinine Clearance: 37.6 mL/min (A) (by C-G formula based on SCr of 1.18 mg/dL (H)). Recent Labs  Lab 05/28/19 0833 05/28/19 1204 05/28/19 1430  WBC 14.5* 14.9*  --   LATICACIDVEN  --   --  1.0    Liver Function Tests: Recent Labs  Lab 05/28/19 0833  AST 18  ALT 23  ALKPHOS 347*  BILITOT 1.4*  PROT 6.7  ALBUMIN 2.6*   No results for input(s): LIPASE, AMYLASE in the last 168 hours. No results for input(s): AMMONIA in the last 168 hours.  ABG    Component Value Date/Time   HCO3 13.1 (L) 12/07/2011 1245  TCO2 14 12/07/2011 1245   ACIDBASEDEF 13.0 (H) 12/07/2011 1245   O2SAT 27.0 12/07/2011 1245     Coagulation Profile: Recent Labs  Lab 05/28/19 0833  INR 2.0*    Cardiac Enzymes: No results for input(s): CKTOTAL, CKMB, CKMBINDEX, TROPONINI in the last 168 hours.  HbA1C: Hgb A1c MFr Bld  Date/Time Value Ref Range Status  05/28/2019 02:30 PM 9.0 (H) 4.8 - 5.6 % Final    Comment:    (NOTE) Pre diabetes:          5.7%-6.4% Diabetes:              >6.4% Glycemic control for   <7.0% adults with diabetes   05/15/2019 01:52 PM 9.2 (H) 4.8 - 5.6 % Final    Comment:    (NOTE) Pre diabetes:          5.7%-6.4% Diabetes:              >6.4% Glycemic control for   <7.0% adults with diabetes     CBG: No results for input(s): GLUCAP in the last 168  hours.  Review of Systems:   Review of Systems  Constitutional: Positive for diaphoresis, fever and malaise/fatigue. Negative for chills and weight loss.  HENT: Positive for congestion. Negative for sore throat.   Eyes: Negative.   Respiratory: Positive for cough and shortness of breath. Negative for stridor.   Cardiovascular: Positive for chest pain, leg swelling and PND. Negative for orthopnea.  Gastrointestinal: Positive for abdominal pain and nausea.  Genitourinary: Negative.   Musculoskeletal: Negative.   Skin: Negative.   Neurological: Negative.   Endo/Heme/Allergies: Negative.   Psychiatric/Behavioral: Negative.      Past Medical History  She,  has a past medical history of Cancer ([place]) (1977), GERD (gastroesophageal reflux disease) (11/14/2013), Gout, and Mixed hyperlipidemia (11/14/2013).   Surgical History    Past Surgical History:  Procedure Laterality Date  . 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 CHOLECYSTECTOMY    . EYE SURGERY     6/3 LASER FOR GLAUCOMA, 9/03 CE/IOL IMPLANTS  . INGUINAL LYMPH NODE BIOPSY Right 06/08/2018   Procedure: RIGHT INGUINAL LYMPH NODE EXCISIONAL BIOPSY;  Surgeon: Clovis Riley, MD;  Location: [facility];  Service: General;  Laterality: Right;  . pancreatectomy    . SHOULDER CLOSED REDUCTION Left 02/03/2013   Procedure: CLOSED MANIPULATION SHOULDER;  Surgeon: Vickey Huger, MD;  Location: [facility];  Service: Orthopedics;  Laterality: Left;  CLOSED MANIPULATION LEFT SHOULDER  . SPLENECTOMY    . TONSILLECTOMY  1970     Social History   reports that she quit smoking about 29 years ago. She has never used smokeless tobacco. She reports that she does not drink alcohol or use drugs.   Family History   Her family history includes Breast cancer (age of onset: 30) in her mother; COPD in her mother; Cancer in her father; Dementia in her mother; Depression in her sister; Diabetes in her father; Glaucoma in her father; Heart disease in her father  and mother; Hypertension in her mother.   Allergies Allergies  Allergen Reactions  . Gabapentin Other (See Comments)    Caused upset stomach  . Penicillins Hives and Other (See Comments)    Has patient had a PCN reaction causing immediate rash, facial/tongue/throat swelling, SOB or lightheadedness with hypotension: Unknown Has patient had a PCN reaction causing severe rash involving mucus membranes or skin necrosis: Unknown Has patient had a PCN reaction that required hospitalization: Unknown  Has patient had a PCN reaction occurring within the last 10 years: No If all of the above answers are "NO", then may proceed with Cephalosporin use.   Marland Kitchen Pepcid [Famotidine] Other (See Comments)    HEADACHE     Home Medications  Prior to Admission medications   Medication Sig Start Date End Date Taking? Authorizing Provider  apixaban (ELIQUIS) 5 MG TABS tablet Take 1 tablet (5 mg total) by mouth 2 (two) times daily. 05/17/19  Yes Dixie Dials, MD  atorvastatin (LIPITOR) 10 MG tablet Take 10 mg by mouth daily at 6 PM.  02/14/19  Yes [provider]  Blood Glucose Monitoring Suppl (ONETOUCH VERIO) w/Device KIT 1 kit by Does not apply route 3 (three) times daily. 12/24/16   Unk Pinto, MD  Cholecalciferol (VITAMIN D-3) 5000 units TABS Take 5,000 tablets by mouth every evening.    [provider]  clonazePAM (KLONOPIN) 1 MG tablet Take 0.5 mg by mouth daily as needed for anxiety. 05/27/18   [provider]  fexofenadine (ALLEGRA) 180 MG tablet Take 180 mg by mouth daily as needed for allergies.     [provider]  glucose blood (ONETOUCH VERIO) test strip CHECK BLOOD SUGAR 3 TIMES DAILY-DX-E11.9 AND Z79.4. 12/24/16   Unk Pinto, MD  insulin NPH-regular Human (70-30) 100 UNIT/ML injection Inject 5-6 Units into the skin See admin instructions. Take 6 units in the morning, and 5 units at night. Hold insulin if blood sugar is less than 99 mg.  Decrease to 3 units if  blood sugar is 100 to 140 mg. 05/17/19   Dixie Dials, MD  losartan (COZAAR) 25 MG tablet Take 1 tablet (25 mg total) by mouth daily. 05/17/19   Dixie Dials, MD  metoprolol succinate (TOPROL-XL) 25 MG 24 hr tablet Take 1 tablet (25 mg total) by mouth daily. 05/18/19   Dixie Dials, MD  omeprazole (PRILOSEC) 20 MG capsule Take 20 mg by mouth daily.    [provider]  Jonetta Speak LANCETS 08X MISC Check blood sugar 3 times daily-DX-E11.9 and Z79.4. 01/23/17   Unk Pinto, MD  pregabalin (LYRICA) 75 MG capsule TAKE 1 CAPSULE BY MOUTH TWICE A DAY Patient taking differently: Take 75 mg by mouth 2 (two) times daily.  12/31/18   Trula Slade, DPM  Propylene Glycol (SYSTANE COMPLETE) 0.6 % SOLN Place 1 drop into both eyes 2 (two) times daily as needed (for dry eyes).    [provider]  traMADol (ULTRAM) 50 MG tablet Take 1 tablet (50 mg total) by mouth every 6 (six) hours as needed. Patient taking differently: Take 50 mg by mouth every 6 (six) hours as needed for moderate pain.  06/08/18 06/08/19  Clovis Riley, MD     Critical care time: na   Erick Colace ACNP-BC [place] Pager # [phone] OR # [phone] if no answer

## 2019-05-28 NOTE — ED Notes (Signed)
Patient dropped to 77% O2 while sleeping. She had removed [place] and we put it back and she returned to 100% on 2L. MD made aware.

## 2019-05-28 NOTE — ED Notes (Signed)
Pt 87% on room air, reports she had oxygen on in the ambulance and felt better with it on. Pt 100% on 2L via [place] at present time.

## 2019-05-28 NOTE — ED Provider Notes (Addendum)
[facility] EMERGENCY DEPARTMENT Provider Note   CSN: [id number] she reports trying nothing Arrival date & time: 05/28/19  6962     History   Chief Complaint Chief Complaint  Patient presents with  . Shortness of Breath    HPI [name] is a 63 y.o. female.     HPI  63 year old female presents today complaining of chest pain, shortness of breath, and vomiting.  She was recently hospitalized and discharged on 824 after being evaluated for syncope and found to be hypoglycemic with new onset of atrial fibrillation.  She states that the chest pain feels like it did when she had the atrial fibrillation before.  She complains of 10 out of 10 chest heaviness.  She has associated dyspnea.  She has had some coughing which proceeds nausea and emesis.  She noted some blood streaking in her emesis.  She is not sure whether or not this is from healing wounds where teeth were extracted in her mouth.  The tooth extraction had precipitated her original episode of hypoglycemia she has been having difficulty taking p.o.  She states that she is still having difficulty eating anything.  She was seen by her dentist since discharge from the hospital.  She is continued to note some bleeding.  She was placed on Eliquis during her last admission.  She reports taking all of her medications as prescribed since discharge.  She reports eating and drinking although she is having difficulty with any chewing.  She denies any fever, chills, abdominal pain, lateralized leg swelling.  She reports trying change in position but no medications with no change in her chest pain. Cardiologist- Dr. Cydney Ok PMD- [facility]  NM perfusion study done 05/17/19- during last admission IMPRESSION: 1. No inducible ischemia identified. Faintly reduced matched anterior wall activity on stress and rest images could be from breast attenuation or mild scarring.  2. Normal left ventricular wall motion.  3. Left  ventricular ejection fraction 68%  4. Non invasive risk stratification*: Low   Past Medical History:  Diagnosis Date  . Cancer ([place]) 1977   CERVICAL  . GERD (gastroesophageal reflux disease) 11/14/2013  . Gout   . Mixed hyperlipidemia 11/14/2013    Patient Active Problem List   Diagnosis Date Noted  . Syncope and collapse 05/15/2019  . Gout 06/11/2015  . Poorly controlled T2_DM due to poor compliance 06/11/2015  . Non-compliance with treatment 09/20/2014  . Insulin Requiring T2_DM 09/07/2014  . Vitamin D deficiency 09/07/2014  . Medication management 09/07/2014  . GERD (gastroesophageal reflux disease) 11/14/2013  . Essential hypertension 11/14/2013  . Mixed hyperlipidemia 11/14/2013  . 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 CHOLECYSTECTOMY    . EYE SURGERY     6/3 LASER FOR GLAUCOMA, 9/03 CE/IOL IMPLANTS  . INGUINAL LYMPH NODE BIOPSY Right 06/08/2018   Procedure: RIGHT INGUINAL LYMPH NODE EXCISIONAL BIOPSY;  Surgeon: Clovis Riley, MD;  Location: [facility];  Service: General;  Laterality: Right;  . pancreatectomy    . SHOULDER CLOSED REDUCTION Left 02/03/2013   Procedure: CLOSED MANIPULATION SHOULDER;  Surgeon: Vickey Huger, MD;  Location: [facility];  Service: Orthopedics;  Laterality: Left;  CLOSED MANIPULATION LEFT SHOULDER  . SPLENECTOMY    . TONSILLECTOMY  1970     OB History   No obstetric history on file.      Home Medications    Prior  to Admission medications   Medication Sig Start Date End Date Taking? Authorizing Provider  apixaban (ELIQUIS) 5 MG TABS tablet Take 1 tablet (5 mg total) by mouth 2 (two) times daily. 05/17/19   Dixie Dials, MD  atorvastatin (LIPITOR) 10 MG tablet Take 10 mg by mouth daily at 6 PM.  02/14/19   [provider]  Blood Glucose Monitoring Suppl (ONETOUCH VERIO) w/Device KIT 1 kit by Does not apply route 3 (three) times daily. 12/24/16    Unk Pinto, MD  Cholecalciferol (VITAMIN D-3) 5000 units TABS Take 5,000 tablets by mouth every evening.    [provider]  clonazePAM (KLONOPIN) 1 MG tablet Take 0.5 mg by mouth daily as needed for anxiety. 05/27/18   [provider]  fexofenadine (ALLEGRA) 180 MG tablet Take 180 mg by mouth daily as needed for allergies.     [provider]  glucose blood (ONETOUCH VERIO) test strip CHECK BLOOD SUGAR 3 TIMES DAILY-DX-E11.9 AND Z79.4. 12/24/16   Unk Pinto, MD  insulin NPH-regular Human (70-30) 100 UNIT/ML injection Inject 5-6 Units into the skin See admin instructions. Take 6 units in the morning, and 5 units at night. Hold insulin if blood sugar is less than 99 mg.  Decrease to 3 units if blood sugar is 100 to 140 mg. 05/17/19   Dixie Dials, MD  losartan (COZAAR) 25 MG tablet Take 1 tablet (25 mg total) by mouth daily. 05/17/19   Dixie Dials, MD  metoprolol succinate (TOPROL-XL) 25 MG 24 hr tablet Take 1 tablet (25 mg total) by mouth daily. 05/18/19   Dixie Dials, MD  omeprazole (PRILOSEC) 20 MG capsule Take 20 mg by mouth daily.    [provider]  Jonetta Speak LANCETS 38S MISC Check blood sugar 3 times daily-DX-E11.9 and Z79.4. 01/23/17   Unk Pinto, MD  pregabalin (LYRICA) 75 MG capsule TAKE 1 CAPSULE BY MOUTH TWICE A DAY Patient taking differently: Take 75 mg by mouth 2 (two) times daily.  12/31/18   Trula Slade, DPM  Propylene Glycol (SYSTANE COMPLETE) 0.6 % SOLN Place 1 drop into both eyes 2 (two) times daily as needed (for dry eyes).    [provider]  traMADol (ULTRAM) 50 MG tablet Take 1 tablet (50 mg total) by mouth every 6 (six) hours as needed. Patient taking differently: Take 50 mg by mouth every 6 (six) hours as needed for moderate pain.  06/08/18 06/08/19  Clovis Riley, MD    Family History Family History  Problem Relation Age of Onset  . Dementia Mother   . Heart disease Mother   . Hypertension  Mother   . COPD Mother   . Breast cancer Mother 41  . Heart disease Father   . Diabetes Father   . Glaucoma Father   . Cancer Father        BLADDER  . Depression Sister     Social History Social History   Tobacco Use  . Smoking status: Former Smoker    Quit date: 09/23/1989    Years since quitting: 29.6  . Smokeless tobacco: Never Used  Substance Use Topics  . Alcohol use: No    Alcohol/week: 0.0 standard drinks  . Drug use: No    Comment: quit>20years ago     Allergies   Gabapentin, Penicillins, and Pepcid [famotidine]   Review of Systems Review of Systems  All other systems reviewed and are negative.    Physical Exam Updated Vital Signs BP (!) 140/59 (BP Location:  Left Arm)   Pulse 91   Temp 98.4 F (36.9 C) (Oral)   Resp 18   SpO2 100%   Physical Exam Vitals signs and nursing note reviewed.  Constitutional:      General: She is not in acute distress.    Appearance: She is well-developed and normal weight. She is not ill-appearing.     Comments: She appears somewhat uncomfortable  HENT:     Head:     Comments: The sutures are in place in the left upper molar area with some mild oozing of blood noted There is no overt fluctuance but she complains of tenderness with any palpation near this area    Mouth/Throat:     Mouth: Mucous membranes are moist.  Eyes:     Pupils: Pupils are equal, round, and reactive to light.  Neck:     Musculoskeletal: Normal range of motion.  Cardiovascular:     Rate and Rhythm: Normal rate and regular rhythm.  Pulmonary:     Effort: Pulmonary effort is normal.  Chest:     Chest wall: Tenderness present.     Comments: Diffuse tenderness palpation across her chest wall Abdominal:     General: Bowel sounds are normal.     Palpations: Abdomen is soft.  Musculoskeletal: Normal range of motion.     Right lower leg: She exhibits no tenderness. No edema.     Left lower leg: She exhibits no tenderness. No edema.  Skin:     General: Skin is warm and dry.     Capillary Refill: Capillary refill takes less than 2 seconds.  Neurological:     General: No focal deficit present.     Mental Status: She is alert and oriented to person, place, and time.  Psychiatric:        Mood and Affect: Mood normal.        Behavior: Behavior normal.      ED Treatments / Results  Labs (all labs ordered are listed, but only abnormal results are displayed) Labs Reviewed - No data to display  EKG EKG Interpretation  Date/Time:  Friday May 28 2019 07:30:29 EDT Ventricular Rate:  89 PR Interval:    QRS Duration: 81 QT Interval:  346 QTC Calculation: 421 R Axis:   86 Text Interpretation:  Sinus rhythm Borderline right axis deviation Baseline wander in lead(s) V2 Confirmed by Pattricia Boss [id number]) on 05/28/2019 8:33:35 AM   Radiology Ct Angio Chest Pe W And/or Wo Contrast  Result Date: 05/28/2019 CLINICAL DATA:  Short of breath, nausea. EXAM: CT ANGIOGRAPHY CHEST WITH CONTRAST TECHNIQUE: Multidetector CT imaging of the chest was performed using the standard protocol during bolus administration of intravenous contrast. Multiplanar CT image reconstructions and MIPs were obtained to evaluate the vascular anatomy. CONTRAST:  132m OMNIPAQUE IOHEXOL 350 MG/ML SOLN COMPARISON:  Neck CT 04/02/2018 FINDINGS: Cardiovascular: No filling defects within the pulmonary arteries to suggest acute pulmonary embolism. No acute findings of the aorta or great vessels. No pericardial fluid. Mediastinum/Nodes: No axillary lymphadenopathy. There is ill-defined soft tissue thickening in the anterior mediastinum measuring 2.5 cm in thickness (image 39/5)a. This ill-defined tissue extends into the LEFT supraclavicular space (image 22/5. The tissue planes about the esophagus or poorly defined particularly inferior to the carina. No pericardial effusion Lungs/Pleura: There is a moderate effusion on the RIGHT and small effusion on the LEFT. There is  interlobular septal thickening in lower lobes. Motion degradation of the imaging. No clear nodularity. Upper Abdomen: Limited view  of the liver, kidneys, pancreas are unremarkable. Normal adrenal glands. Musculoskeletal: No aggressive osseous lesion. Review of the MIP images confirms the above findings. IMPRESSION: 1. Ill-defined soft tissue thickening in the anterior mediastinum extending into the LEFT supraclavicular clavicular nodal station. Concern for lymphoproliferative process or thymic origin neoplasm. Findings are new from comparison neck CT 2019. Consider FDG PET scan to evaluate malignant potential. LEFT supraclavicular tissue may be amenable to biopsy. 2. Bilateral pleural effusions RIGHT greater than LEFT. RIGHT effusion is moderate. 3. Mild interstitial edema. 4. No evidence of acute pulmonary embolism. Electronically Signed   By: Suzy Bouchard M.D.   On: 05/28/2019 10:57   Dg Chest Port 1 View  Result Date: 05/28/2019 CLINICAL DATA:  Chest pain and dyspnea EXAM: PORTABLE CHEST 1 VIEW COMPARISON:  05/15/2019 FINDINGS: Cardiac shadows within normal limits. Small right pleural effusion is noted. Mild bibasilar atelectasis is seen. No bony abnormality is noted. IMPRESSION: Right pleural effusion. Electronically Signed   By: Inez Catalina M.D.   On: 05/28/2019 07:46    Procedures Procedures (including critical care time)  Medications Ordered in ED Medications - No data to display   Initial Impression / Assessment and Plan / ED Course  I have reviewed the triage vital signs and the nursing notes.  Pertinent labs & imaging results that were available during my care of the patient were reviewed by me and considered in my medical decision making (see chart for details).       10:43 AM Review of vitals signs reveals patient with rectal temp of 100.5 HR and BP normal coid negative CT chest pending 1- dyspnea and chest pain-patient with pulmonary effusions, probable with some chf.  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  No evidence of focal infiltrate or pe Patient appears to be somewhat volume overloaded and will add Lasix. 2- febrile illness- no pneumonia noted, covid negative plan blood cxs given risk identified for blood dyscrasia and leukocytosis Blood cultures ordered. 3- chest mass    Discussed with Dr. Doristine Bosworth  Discussed with patient and her husband Plan admission for further evaluation and treatment  Final Clinical Impressions(s) / ED Diagnoses   Final diagnoses:  Pleural effusion  Elevated LFTs  Dyspnea    ED Discharge Orders    None       Pattricia Boss, MD 05/28/19 Pollie Meyer    Pattricia Boss, MD 05/28/19 [phone]

## 2019-05-28 NOTE — ED Notes (Signed)
Patient had one episode of emesis.

## 2019-05-28 NOTE — ED Notes (Signed)
ED TO INPATIENT HANDOFF REPORT  ED Nurse Name and Phone #: Bryann Mcnealy/Nicole  S Name/Age/Gender [name] 63 y.o. female Room/Bed: 012C/012C  Code Status   Code Status: Full Code  Home/SNF/Other Home Patient oriented to: self, place, time and situation Is this baseline? Yes   Triage Complete: Triage complete  Chief Complaint sick  Triage Note Pt brought to ED by [facility] home for c/o cp, sob, nausea, since yesterday 1 pm, afib on the monitor on eliquis, BP 162/67, P-88, R-20, SPO2 100% RA. CBG 200. 324 mg ASA given by [facility] pta.   Allergies Allergies  Allergen Reactions  . Gabapentin Other (See Comments)    Caused upset stomach  . Penicillins Hives and Other (See Comments)    Has patient had a PCN reaction causing immediate rash, facial/tongue/throat swelling, SOB or lightheadedness with hypotension: Unknown Has patient had a PCN reaction causing severe rash involving mucus membranes or skin necrosis: Unknown Has patient had a PCN reaction that required hospitalization: Unknown Has patient had a PCN reaction occurring within the last 10 years: No If all of the above answers are "NO", then may proceed with Cephalosporin use.   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 GERD (gastroesophageal  reflux disease) 11/14/2013  . Gout   . Mixed hyperlipidemia 11/14/2013   Past Surgical History:  Procedure Laterality Date  . 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 CHOLECYSTECTOMY    . EYE SURGERY     6/3 LASER FOR GLAUCOMA, 9/03 CE/IOL IMPLANTS  . INGUINAL LYMPH NODE BIOPSY Right 06/08/2018   Procedure: RIGHT INGUINAL LYMPH NODE EXCISIONAL BIOPSY;  Surgeon: Clovis Riley, MD;  Location: [facility];  Service: General;  Laterality: Right;  . pancreatectomy    . SHOULDER CLOSED REDUCTION Left 02/03/2013   Procedure: CLOSED MANIPULATION SHOULDER;  Surgeon: Vickey Huger, MD;  Location: [facility];  Service: Orthopedics;  Laterality: Left;  CLOSED MANIPULATION LEFT SHOULDER  . SPLENECTOMY    . TONSILLECTOMY  1970     A IV Location/Drains/Wounds Patient Lines/Drains/Airways Status   Active Line/Drains/Airways    Name:   Placement date:   Placement time:   Site:   Days:   Peripheral IV 05/15/19 Anterior;Right Hand   05/15/19    0724    Hand   13   Peripheral IV 05/28/19 Right Antecubital   05/28/19    0831    Antecubital   less than 1   Incision 02/04/13 Arm   02/04/13    0021     2304   Incision (Closed) 12/27/16 Shoulder Left   12/27/16    0807     882   Incision (Closed) 06/08/18 Groin   06/08/18    0824     354  Intake/Output Last 24 hours No intake or output data in the 24 hours ending 05/28/19 1551  Labs/Imaging Results for orders placed or performed during the hospital encounter of 05/28/19 (from the past 48 hour(s))  SARS Coronavirus 2 [facility] order, Performed in [facility] hospital lab) Nasopharyngeal Nasopharyngeal Swab     Status: None   Collection Time: 05/28/19  7:50 AM   Specimen: Nasopharyngeal Swab  Result Value Ref Range   SARS Coronavirus 2 NEGATIVE NEGATIVE    Comment: (NOTE) If result is NEGATIVE SARS-CoV-2 target nucleic acids are NOT DETECTED. The SARS-CoV-2 RNA is generally detectable in upper and lower  respiratory specimens during the acute  phase of infection. The lowest  concentration of SARS-CoV-2 viral copies this assay can detect is 250  copies / mL. A negative result does not preclude SARS-CoV-2 infection  and should not be used as the sole basis for treatment or other  patient management decisions.  A negative result may occur with  improper specimen collection / handling, submission of specimen other  than nasopharyngeal swab, presence of viral mutation(s) within the  areas targeted by this assay, and inadequate number of viral copies  (<250 copies / mL). A negative result must be combined with clinical  observations, patient history, and epidemiological information. If result is POSITIVE SARS-CoV-2 target nucleic acids are DETECTED. The SARS-CoV-2 RNA is generally detectable in upper and lower  respiratory specimens dur ing the acute phase of infection.  Positive  results are indicative of active infection with SARS-CoV-2.  Clinical  correlation with patient history and other diagnostic information is  necessary to determine patient infection status.  Positive results do  not rule out bacterial infection or co-infection with other viruses. If result is PRESUMPTIVE POSTIVE SARS-CoV-2 nucleic acids MAY BE PRESENT.   A presumptive positive result was obtained on the submitted specimen  and confirmed on repeat testing.  While 2019 novel coronavirus  (SARS-CoV-2) nucleic acids may be present in the submitted sample  additional confirmatory testing may be necessary for epidemiological  and / or clinical management purposes  to differentiate between  SARS-CoV-2 and other Sarbecovirus currently known to infect humans.  If clinically indicated additional testing with an alternate test  methodology [phone]) is advised. The SARS-CoV-2 RNA is generally  detectable in upper and lower respiratory sp ecimens during the acute  phase of infection. The expected result is Negative. Fact Sheet for Patients:   StrictlyIdeas.no Fact Sheet for Healthcare Providers: BankingDealers.co.za This test is not yet approved or cleared by the [place] FDA and has been authorized for detection and/or diagnosis of SARS-CoV-2 by FDA under an Emergency Use Authorization (EUA).  This EUA will remain in effect (meaning this test can be used) for the duration of the COVID-19 declaration under Section 564(b)(1) of the Act, 21 U.S.C. section 360bbb-3(b)(1), unless the authorization is terminated or revoked sooner. Performed at [facility], [place] [address], [place], [place] [address]   CBC     Status: Abnormal   Collection Time: 05/28/19  8:33 AM  Result Value Ref Range   WBC 14.5 (H) 4.0 - 10.5 K/uL   RBC 4.10 3.87 - 5.11 MIL/uL   Hemoglobin 10.6 (L) 12.0 - 15.0 g/dL   HCT 32.9 (L) 36.0 - 46.0 %   MCV 80.2 80.0 - 100.0 fL   MCH 25.9 (L) 26.0 - 34.0 pg   MCHC 32.2 30.0 - 36.0 g/dL   RDW 14.6 11.5 - 15.5 %   Platelets 623 (  H) 150 - 400 K/uL   nRBC 0.0 0.0 - 0.2 %    Comment: Performed at [facility], [place] [address], [place], [place] [address]  Comprehensive metabolic panel     Status: Abnormal   Collection Time: 05/28/19  8:33 AM  Result Value Ref Range   Sodium 132 (L) 135 - 145 mmol/L   Potassium 4.0 3.5 - 5.1 mmol/L   Chloride 95 (L) 98 - 111 mmol/L   CO2 22 22 - 32 mmol/L   Glucose, Bld 264 (H) 70 - 99 mg/dL   BUN 23 8 - 23 mg/dL   Creatinine, Ser 1.18 (H) 0.44 - 1.00 mg/dL   Calcium 8.9 8.9 - 10.3 mg/dL   Total Protein 6.7 6.5 - 8.1 g/dL   Albumin 2.6 (L) 3.5 - 5.0 g/dL   AST 18 15 - 41 U/L   ALT 23 0 - 44 U/L   Alkaline Phosphatase 347 (H) 38 - 126 U/L   Total Bilirubin 1.4 (H) 0.3 - 1.2 mg/dL   GFR calc non Af Amer 48 (L) >60 mL/min   GFR calc Af Amer 56 (L) >60 mL/min   Anion gap 15 5 - 15    Comment: Performed at [facility], [place] [address] [place], [place] [address]  Protime-INR     Status: Abnormal   Collection  Time: 05/28/19  8:33 AM  Result Value Ref Range   Prothrombin Time 22.8 (H) 11.4 - 15.2 seconds   INR 2.0 (H) 0.8 - 1.2    Comment: (NOTE) INR goal varies based on device and disease states. Performed at [facility], [place] [address], [place], [place] [address]   Troponin I (High Sensitivity)     Status: None   Collection Time: 05/28/19  8:33 AM  Result Value Ref Range   Troponin I (High Sensitivity) 15 <18 ng/L    Comment: (NOTE) Elevated high sensitivity troponin I (hsTnI) values and significant  changes across serial measurements may suggest ACS but many other  chronic and acute conditions are known to elevate hsTnI results.  Refer to the "Links" section for chest pain algorithms and additional  guidance. Performed at [facility], [place] [address] [place], [place] [address]   Troponin I (High Sensitivity)     Status: None   Collection Time: 05/28/19  9:20 AM  Result Value Ref Range   Troponin I (High Sensitivity) 16 <18 ng/L    Comment: (NOTE) Elevated high sensitivity troponin I (hsTnI) values and significant  changes across serial measurements may suggest ACS but many other  chronic and acute conditions are known to elevate hsTnI results.  Refer to the "Links" section for chest pain algorithms and additional  guidance. Performed at [facility], [place] [address] [place], [place] [address]   CBC with Differential/Platelet     Status: Abnormal   Collection Time: 05/28/19 12:04 PM  Result Value Ref Range   WBC 14.9 (H) 4.0 - 10.5 K/uL   RBC 4.01 3.87 - 5.11 MIL/uL   Hemoglobin 10.3 (L) 12.0 - 15.0 g/dL   HCT 31.6 (L) 36.0 - 46.0 %   MCV 78.8 (L) 80.0 - 100.0 fL   MCH 25.7 (L) 26.0 - 34.0 pg   MCHC 32.6 30.0 - 36.0 g/dL   RDW 14.5 11.5 - 15.5 %   Platelets 593 (H) 150 - 400 K/uL   nRBC 0.0 0.0 - 0.2 %   Neutrophils Relative % 75 %  Neutro Abs 11.3 (H) 1.7 - 7.7 K/uL   Lymphocytes Relative 11 %   Lymphs Abs 1.7 0.7 - 4.0 K/uL   Monocytes Relative  12 %   Monocytes Absolute 1.8 (H) 0.1 - 1.0 K/uL   Eosinophils Relative 0 %   Eosinophils Absolute 0.0 0.0 - 0.5 K/uL   Basophils Relative 1 %   Basophils Absolute 0.1 0.0 - 0.1 K/uL   Immature Granulocytes 1 %   Abs Immature Granulocytes 0.08 (H) 0.00 - 0.07 K/uL    Comment: Performed at [place] [address], [place], [place] [address]  Magnesium     Status: None   Collection Time: 05/28/19 12:51 PM  Result Value Ref Range   Magnesium 2.1 1.7 - 2.4 mg/dL    Comment: Performed at [facility], [place] [address], [place], [place] [address]  Phosphorus     Status: None   Collection Time: 05/28/19 12:51 PM  Result Value Ref Range   Phosphorus 2.6 2.5 - 4.6 mg/dL    Comment: Performed at [facility], [place] [address] [place], [place] [address]  Lactate dehydrogenase     Status: Abnormal   Collection Time: 05/28/19 12:51 PM  Result Value Ref Range   LDH 227 (H) 98 - 192 U/L    Comment: Performed at [facility], [place] [address] [place], [place] [address]  Sedimentation rate     Status: Abnormal   Collection Time: 05/28/19 12:51 PM  Result Value Ref Range   Sed Rate 80 (H) 0 - 22 mm/hr    Comment: Performed at [place] [address] [place], [place] [address]  Brain natriuretic peptide     Status: Abnormal   Collection Time: 05/28/19 12:58 PM  Result Value Ref Range   B Natriuretic Peptide 241.6 (H) 0.0 - 100.0 pg/mL    Comment: Performed at [facility] [address], [place], [place] [address]  TSH     Status: None   Collection Time: 05/28/19  2:30 PM  Result Value Ref Range   TSH 0.663 0.350 - 4.500 uIU/mL    Comment: Performed by a 3rd Generation assay with a functional sensitivity of <=0.01 uIU/mL. Performed at [facility], [place] [address] [place], [place] [address]   Hemoglobin A1c     Status: Abnormal   Collection Time: 05/28/19  2:30 PM  Result Value Ref Range   Hgb A1c MFr Bld 9.0 (H) 4.8 - 5.6 %    Comment:  (NOTE) Pre diabetes:          5.7%-6.4% Diabetes:              >6.4% Glycemic control for   <7.0% adults with diabetes    Mean Plasma Glucose 211.6 mg/dL    Comment: Performed at [facility] [address] [place], [place] [address]  Lactic acid, plasma     Status: None   Collection Time: 05/28/19  2:30 PM  Result Value Ref Range   Lactic Acid, Venous 1.0 0.5 - 1.9 mmol/L    Comment: Performed at [place] [address] [place], [place] [address]  Troponin I (High Sensitivity)     Status: None   Collection Time: 05/28/19  2:30 PM  Result Value Ref Range   Troponin I (High Sensitivity) 16 <18 ng/L    Comment: (NOTE) Elevated high sensitivity troponin I (hsTnI) values and significant  changes across  serial measurements may suggest ACS but many other  chronic and acute conditions are known to elevate hsTnI results.  Refer to the "Links" section for chest pain algorithms and additional  guidance. Performed at [facility], [place] [address], [place], [place] [address]    Ct Angio Chest Pe W And/or Wo Contrast  Result Date: 05/28/2019 CLINICAL DATA:  Short of breath, nausea. EXAM: CT ANGIOGRAPHY CHEST WITH CONTRAST TECHNIQUE: Multidetector CT imaging of the chest was performed using the standard protocol during bolus administration of intravenous contrast. Multiplanar CT image reconstructions and MIPs were obtained to evaluate the vascular anatomy. CONTRAST:  154mL OMNIPAQUE IOHEXOL 350 MG/ML SOLN COMPARISON:  Neck CT 04/02/2018 FINDINGS: Cardiovascular: No filling defects within the pulmonary arteries to suggest acute pulmonary embolism. No acute findings of the aorta or great vessels. No pericardial fluid. Mediastinum/Nodes: No axillary lymphadenopathy. There is ill-defined soft tissue thickening in the anterior mediastinum measuring 2.5 cm in thickness (image 39/5)a. This ill-defined tissue extends into the LEFT supraclavicular space (image 22/5. The tissue planes  about the esophagus or poorly defined particularly inferior to the carina. No pericardial effusion Lungs/Pleura: There is a moderate effusion on the RIGHT and small effusion on the LEFT. There is interlobular septal thickening in lower lobes. Motion degradation of the imaging. No clear nodularity. Upper Abdomen: Limited view of the liver, kidneys, pancreas are unremarkable. Normal adrenal glands. Musculoskeletal: No aggressive osseous lesion. Review of the MIP images confirms the above findings. IMPRESSION: 1. Ill-defined soft tissue thickening in the anterior mediastinum extending into the LEFT supraclavicular clavicular nodal station. Concern for lymphoproliferative process or thymic origin neoplasm. Findings are new from comparison neck CT 2019. Consider FDG PET scan to evaluate malignant potential. LEFT supraclavicular tissue may be amenable to biopsy. 2. Bilateral pleural effusions RIGHT greater than LEFT. RIGHT effusion is moderate. 3. Mild interstitial edema. 4. No evidence of acute pulmonary embolism. Electronically Signed   By: Suzy Bouchard M.D.   On: 05/28/2019 10:57   US Abdomen Complete  Result Date: 05/28/2019 CLINICAL DATA:  Elevated LFTs EXAM: ABDOMEN ULTRASOUND COMPLETE COMPARISON:  04/02/2018 FINDINGS: Gallbladder: Surgically removed Common bile duct: Diameter: 7.5 mm. Liver: No focal lesion identified. Within normal limits in parenchymal echogenicity. Portal vein is patent on color Doppler imaging with normal direction of blood flow towards the liver. IVC: No abnormality visualized. Pancreas: Visualized portion unremarkable. Spleen: Surgically removed Right Kidney: Length: 11.6 cm. Echogenicity within normal limits. No mass or hydronephrosis visualized. Left Kidney: Length: 11.2 cm. Echogenicity within normal limits. No mass or hydronephrosis visualized. Abdominal aorta: No aneurysm visualized. Other findings: Note is made of a right-sided pleural effusion. IMPRESSION: Postsurgical changes.  No acute abnormality noted. Electronically Signed   By: Inez Catalina M.D.   On: 05/28/2019 14:01   Dg Chest Port 1 View  Result Date: 05/28/2019 CLINICAL DATA:  Chest pain and dyspnea EXAM: PORTABLE CHEST 1 VIEW COMPARISON:  05/15/2019 FINDINGS: Cardiac shadows within normal limits. Small right pleural effusion is noted. Mild bibasilar atelectasis is seen. No bony abnormality is noted. IMPRESSION: Right pleural effusion. Electronically Signed   By: Inez Catalina M.D.   On: 05/28/2019 07:46    Pending Labs Unresulted Labs (From admission, onward)    Start     Ordered   05/29/19 0500  Comprehensive metabolic panel  Tomorrow morning,   R     05/28/19 1252   05/29/19 0500  CBC  Tomorrow morning,   R     05/28/19 1252  05/29/19 0500  Protime-INR  Tomorrow morning,   R     05/28/19 1252   05/28/19 1324  HIV Antibody (routine testing w rflx)  Once,   STAT     05/28/19 1324   05/28/19 1300  Procalcitonin - Baseline  ONCE - STAT,   STAT     05/28/19 1259   05/28/19 1300  Lactic acid, plasma  STAT Now then every 3 hours,   R (with STAT occurrences)     05/28/19 1259   05/28/19 1148  Blood culture (routine x 2)  BLOOD CULTURE X 2,   STAT     05/28/19 1147          Vitals/Pain Today's Vitals   05/28/19 1445 05/28/19 1500 05/28/19 1515 05/28/19 1530  BP: (!) 120/54 (!) 86/62 (!) 123/58 (!) 130/55  Pulse: 78 79 76 77  Resp: 11 19 13 13   Temp:      TempSrc:      SpO2: 98% 99% 99% 99%  PainSc:        Isolation Precautions Airborne and Contact precautions  Medications Medications  sodium chloride flush (NS) 0.9 % injection 3 mL (has no administration in time range)  acetaminophen (TYLENOL) tablet 650 mg (has no administration in time range)    Or  acetaminophen (TYLENOL) suppository 650 mg (has no administration in time range)  ondansetron (ZOFRAN) tablet 4 mg (has no administration in time range)    Or  ondansetron (ZOFRAN) injection 4 mg (has no administration in time range)   vancomycin (VANCOCIN) IVPB 1000 mg/200 mL premix (has no administration in time range)  ceFEPIme (MAXIPIME) 2 g in sodium chloride 0.9 % 100 mL IVPB (has no administration in time range)  ceFEPIme (MAXIPIME) 2 g in sodium chloride 0.9 % 100 mL IVPB (has no administration in time range)  vancomycin (VANCOCIN) 1,250 mg in sodium chloride 0.9 % 250 mL IVPB (has no administration in time range)  0.9 %  sodium chloride infusion (has no administration in time range)  insulin aspart (novoLOG) injection 0-15 Units (has no administration in time range)  insulin aspart (novoLOG) injection 0-5 Units (has no administration in time range)  ondansetron (ZOFRAN) injection 4 mg (4 mg Intravenous Given 05/28/19 0913)  iohexol (OMNIPAQUE) 350 MG/ML injection 100 mL (100 mLs Intravenous Contrast Given 05/28/19 1007)  sodium chloride 0.9 % bolus 500 mL (500 mLs Intravenous New Bag/Given 05/28/19 1055)  acetaminophen (TYLENOL) tablet 650 mg (650 mg Oral Given 05/28/19 1051)  furosemide (LASIX) injection 40 mg (40 mg Intravenous Given 05/28/19 1202)    Mobility walks Low fall risk   Focused Assessments Pulmonary Assessment Handoff:  Lung sounds:   O2 Device: Room Air O2 Flow Rate (L/min): 2 L/min      R Recommendations: See Admitting Provider Note  Report given to:   Additional Notes:

## 2019-05-28 NOTE — Progress Notes (Signed)
Received Pt from ED, AOx4, oriented to room, placed call bell and phone within reach. No complaints of pain. Will continue to monitor patient.

## 2019-05-29 ENCOUNTER — Observation Stay (HOSPITAL_COMMUNITY): Payer: Medicare Other

## 2019-05-29 DIAGNOSIS — J9859 Other diseases of mediastinum, not elsewhere classified: Secondary | ICD-10-CM | POA: Diagnosis present

## 2019-05-29 DIAGNOSIS — K219 Gastro-esophageal reflux disease without esophagitis: Secondary | ICD-10-CM | POA: Diagnosis present

## 2019-05-29 DIAGNOSIS — K08409 Partial loss of teeth, unspecified cause, unspecified class: Secondary | ICD-10-CM | POA: Diagnosis present

## 2019-05-29 DIAGNOSIS — Z90710 Acquired absence of both cervix and uterus: Secondary | ICD-10-CM | POA: Diagnosis not present

## 2019-05-29 DIAGNOSIS — Z79899 Other long term (current) drug therapy: Secondary | ICD-10-CM | POA: Diagnosis not present

## 2019-05-29 DIAGNOSIS — E1169 Type 2 diabetes mellitus with other specified complication: Secondary | ICD-10-CM | POA: Diagnosis not present

## 2019-05-29 DIAGNOSIS — E782 Mixed hyperlipidemia: Secondary | ICD-10-CM | POA: Diagnosis present

## 2019-05-29 DIAGNOSIS — I5032 Chronic diastolic (congestive) heart failure: Secondary | ICD-10-CM | POA: Diagnosis present

## 2019-05-29 DIAGNOSIS — E559 Vitamin D deficiency, unspecified: Secondary | ICD-10-CM | POA: Diagnosis present

## 2019-05-29 DIAGNOSIS — Z7901 Long term (current) use of anticoagulants: Secondary | ICD-10-CM | POA: Diagnosis not present

## 2019-05-29 DIAGNOSIS — Z794 Long term (current) use of insulin: Secondary | ICD-10-CM | POA: Diagnosis not present

## 2019-05-29 DIAGNOSIS — Z8541 Personal history of malignant neoplasm of cervix uteri: Secondary | ICD-10-CM | POA: Diagnosis not present

## 2019-05-29 DIAGNOSIS — N179 Acute kidney failure, unspecified: Secondary | ICD-10-CM | POA: Diagnosis present

## 2019-05-29 DIAGNOSIS — M109 Gout, unspecified: Secondary | ICD-10-CM | POA: Diagnosis present

## 2019-05-29 DIAGNOSIS — R591 Generalized enlarged lymph nodes: Secondary | ICD-10-CM | POA: Diagnosis present

## 2019-05-29 DIAGNOSIS — Z880 Allergy status to penicillin: Secondary | ICD-10-CM | POA: Diagnosis not present

## 2019-05-29 DIAGNOSIS — E860 Dehydration: Secondary | ICD-10-CM | POA: Diagnosis present

## 2019-05-29 DIAGNOSIS — I110 Hypertensive heart disease with heart failure: Secondary | ICD-10-CM | POA: Diagnosis present

## 2019-05-29 DIAGNOSIS — Z9049 Acquired absence of other specified parts of digestive tract: Secondary | ICD-10-CM | POA: Diagnosis not present

## 2019-05-29 DIAGNOSIS — D72820 Lymphocytosis (symptomatic): Secondary | ICD-10-CM | POA: Diagnosis present

## 2019-05-29 DIAGNOSIS — R0600 Dyspnea, unspecified: Secondary | ICD-10-CM | POA: Diagnosis present

## 2019-05-29 DIAGNOSIS — I480 Paroxysmal atrial fibrillation: Secondary | ICD-10-CM | POA: Diagnosis present

## 2019-05-29 DIAGNOSIS — Z20828 Contact with and (suspected) exposure to other viral communicable diseases: Secondary | ICD-10-CM | POA: Diagnosis present

## 2019-05-29 DIAGNOSIS — Z888 Allergy status to other drugs, medicaments and biological substances status: Secondary | ICD-10-CM | POA: Diagnosis not present

## 2019-05-29 DIAGNOSIS — J90 Pleural effusion, not elsewhere classified: Secondary | ICD-10-CM | POA: Diagnosis present

## 2019-05-29 DIAGNOSIS — Z87891 Personal history of nicotine dependence: Secondary | ICD-10-CM | POA: Diagnosis not present

## 2019-05-29 LAB — CBC
HCT: 25 % — ABNORMAL LOW (ref 36.0–46.0)
Hemoglobin: 8.5 g/dL — ABNORMAL LOW (ref 12.0–15.0)
MCH: 26.6 pg (ref 26.0–34.0)
MCHC: 34 g/dL (ref 30.0–36.0)
MCV: 78.4 fL — ABNORMAL LOW (ref 80.0–100.0)
Platelets: 574 10*3/uL — ABNORMAL HIGH (ref 150–400)
RBC: 3.19 MIL/uL — ABNORMAL LOW (ref 3.87–5.11)
RDW: 14.5 % (ref 11.5–15.5)
WBC: 16.3 10*3/uL — ABNORMAL HIGH (ref 4.0–10.5)
nRBC: 0 % (ref 0.0–0.2)

## 2019-05-29 LAB — BLOOD CULTURE ID PANEL (REFLEXED)

## 2019-05-29 LAB — COMPREHENSIVE METABOLIC PANEL
ALT: 17 U/L (ref 0–44)
AST: 15 U/L (ref 15–41)
Albumin: 2.1 g/dL — ABNORMAL LOW (ref 3.5–5.0)
Alkaline Phosphatase: 246 U/L — ABNORMAL HIGH (ref 38–126)
Anion gap: 13 (ref 5–15)
BUN: 25 mg/dL — ABNORMAL HIGH (ref 8–23)
CO2: 22 mmol/L (ref 22–32)
Calcium: 7.9 mg/dL — ABNORMAL LOW (ref 8.9–10.3)
Chloride: 99 mmol/L (ref 98–111)
Creatinine, Ser: 1.25 mg/dL — ABNORMAL HIGH (ref 0.44–1.00)
GFR calc Af Amer: 52 mL/min — ABNORMAL LOW (ref >60)
GFR calc non Af Amer: 45 mL/min — ABNORMAL LOW (ref >60)
Glucose, Bld: 193 mg/dL — ABNORMAL HIGH (ref 70–99)
Potassium: 4.2 mmol/L (ref 3.5–5.1)
Sodium: 134 mmol/L — ABNORMAL LOW (ref 135–145)
Total Bilirubin: 0.8 mg/dL (ref 0.3–1.2)
Total Protein: 5.6 g/dL — ABNORMAL LOW (ref 6.5–8.1)

## 2019-05-29 LAB — BODY FLUID CELL COUNT WITH DIFFERENTIAL
Lymphs, Fluid: 52 %
Monocyte-Macrophage-Serous Fluid: 36 % — ABNORMAL LOW (ref 50–90)
Neutrophil Count, Fluid: 12 % (ref 0–25)
Total Nucleated Cell Count, Fluid: 243 cu mm (ref 0–1000)

## 2019-05-29 LAB — GRAM STAIN: Gram Stain: NONE SEEN

## 2019-05-29 LAB — GLUCOSE, PLEURAL OR PERITONEAL FLUID: Glucose, Fluid: 194 mg/dL

## 2019-05-29 LAB — GLUCOSE, CAPILLARY
Glucose-Capillary: 176 mg/dL — ABNORMAL HIGH (ref 70–99)
Glucose-Capillary: 137 mg/dL — ABNORMAL HIGH (ref 70–99)
Glucose-Capillary: 223 mg/dL — ABNORMAL HIGH (ref 70–99)
Glucose-Capillary: 216 mg/dL — ABNORMAL HIGH (ref 70–99)

## 2019-05-29 LAB — PROTEIN, PLEURAL OR PERITONEAL FLUID: Total protein, fluid: 3.2 g/dL

## 2019-05-29 LAB — LACTATE DEHYDROGENASE, PLEURAL OR PERITONEAL FLUID: LD, Fluid: 88 U/L — ABNORMAL HIGH (ref 3–23)

## 2019-05-29 LAB — PROTIME-INR
INR: 1.8 — ABNORMAL HIGH (ref 0.8–1.2)
Prothrombin Time: 20.7 seconds — ABNORMAL HIGH (ref 11.4–15.2)

## 2019-05-29 LAB — HIV ANTIBODY (ROUTINE TESTING W REFLEX): HIV Screen 4th Generation wRfx: NONREACTIVE

## 2019-05-29 MED ORDER — APIXABAN 5 MG PO TABS
5.0000 mg | ORAL_TABLET | Freq: Two times a day (BID) | ORAL | Status: DC
  Administered 2019-05-29 – 2019-05-31 (×5): 5 mg via ORAL
  Filled 2019-05-29 (×5): qty 1

## 2019-05-29 MED ORDER — LIDOCAINE HCL (PF) 1 % IJ SOLN
INTRAMUSCULAR | Status: AC
  Filled 2019-05-29: qty 30

## 2019-05-29 NOTE — Progress Notes (Signed)
PHARMACY - PHYSICIAN COMMUNICATION CRITICAL VALUE ALERT - BLOOD CULTURE IDENTIFICATION (BCID)  [name] is an 63 y.o. female who presented to [facility] on 05/28/2019 with a chief complaint of CP and SOB  Assessment:  22 YOF on broad antibiotics for coverage of mediastinal mass of unclear etiology. Also noted to have B/L pleural effusions, s/p thoracentesis 9/5. 1 of 4 blood cultures is now growing MSSE - possible contaminant.   Name of physician (or Provider) Contacted: Broadus John  Current antibiotics: Vancomycin + Cefepime  Changes to prescribed antibiotics recommended:  Discussed with Dr. Broadus John who has stopped Vancomycin and will continue Cefepime while awaiting results of additional tests and cultures.   Results for orders placed or performed during the hospital encounter of 05/28/19  Blood Culture ID Panel (Reflexed) (Collected: 05/28/2019 12:04 PM)  Result Value Ref Range   Enterococcus species NOT DETECTED NOT DETECTED   Listeria monocytogenes NOT DETECTED NOT DETECTED   Staphylococcus species DETECTED (A) NOT DETECTED   Staphylococcus aureus (BCID) NOT DETECTED NOT DETECTED   Methicillin resistance NOT DETECTED NOT DETECTED   Streptococcus species NOT DETECTED NOT DETECTED   Streptococcus agalactiae NOT DETECTED NOT DETECTED   Streptococcus pneumoniae NOT DETECTED NOT DETECTED   Streptococcus pyogenes NOT DETECTED NOT DETECTED   Acinetobacter baumannii NOT DETECTED NOT DETECTED   Enterobacteriaceae species NOT DETECTED NOT DETECTED   Enterobacter cloacae complex NOT DETECTED NOT DETECTED   Escherichia coli NOT DETECTED NOT DETECTED   Klebsiella oxytoca NOT DETECTED NOT DETECTED   Klebsiella pneumoniae NOT DETECTED NOT DETECTED   Proteus species NOT DETECTED NOT DETECTED   Serratia marcescens NOT DETECTED NOT DETECTED   Haemophilus influenzae NOT DETECTED NOT DETECTED   Neisseria meningitidis NOT DETECTED NOT DETECTED   Pseudomonas aeruginosa NOT DETECTED NOT DETECTED   Candida albicans NOT DETECTED NOT DETECTED   Candida glabrata NOT DETECTED NOT DETECTED   Candida krusei NOT DETECTED NOT DETECTED   Candida parapsilosis NOT DETECTED NOT DETECTED   Candida tropicalis NOT DETECTED NOT DETECTED    Thank you for allowing pharmacy to be a part of this patient's care.  Alycia Rossetti, PharmD, BCPS Clinical Pharmacist Clinical phone for 05/29/2019: [phone] 05/29/2019 11:09 AM   **Pharmacist phone directory can now be found on [email or web address] (PW [email or web address]).  Listed under [place].

## 2019-05-29 NOTE — Evaluation (Signed)
Physical Therapy Evaluation Patient Details Name: [name] MRN: [id number] DOB: [birth year] Today's Date: 05/29/2019   History of Present Illness  63 year old female with hypertension dyslipidemia type 2 diabetes, paroxysmal atrial fibrillation, chronic lip lymphocytosis, history of splenectomy.  Recently admitted by Dr. Doylene Canard with syncope felt to be mediated by hypoglycemia, diagnosed with atrial fibrillation.  Admitted with worsening dyspnea, found to have right greater than left pleural effusion and CT angiogram which showed mediastinal mass. Underwent thoracentesis 05/29/19 to remove fluid.  Clinical Impression  Patient presents with mild dependencies in gait and mobility, mostly due to decreased balance.  I feel her balance issues are related to prolonged bedrest and medications and will resolve quickly.  Recommended patient use cane when ambulating outside for increased balance.  Encouraged husband to ambulate with patient while in hospital to increase endurance and strength.  PT will check in on Mon/Tue if still here to ensure progression back to baseline.    Follow Up Recommendations No PT follow up    Equipment Recommendations  None recommended by PT    Recommendations for Other Services       Precautions / Restrictions Precautions Precautions: None      Mobility  Bed Mobility Overal bed mobility: Modified Independent             General bed mobility comments: used railing to reach edge of bed  Transfers Overall transfer level: Needs assistance Equipment used: None Transfers: Sit to/from Stand Sit to Stand: Min guard         General transfer comment: min guard for safety, slightly off balance upon standing initially  Ambulation/Gait Ambulation/Gait assistance: Min guard Gait Distance (Feet): 100 Feet Assistive device: 1 person hand held assist Gait Pattern/deviations: Step-through pattern     General Gait Details: at times unsteady during gait, able to  self-regain, patient with some SOB at end of ambulating  Stairs            Wheelchair Mobility    Modified Rankin (Stroke Patients Only)       Balance Overall balance assessment: Needs assistance Sitting-balance support: No upper extremity supported;Feet supported Sitting balance-Leahy Scale: Good     Standing balance support: No upper extremity supported Standing balance-Leahy Scale: Fair                               Pertinent Vitals/Pain Pain Assessment: No/denies pain    Home Living Family/patient expects to be discharged to:: Private residence Living Arrangements: Spouse/significant other Available Help at Discharge: Family Type of Home: House         Home Equipment: Gilford Rile - 2 wheels;Cane - single point;Bedside commode      Prior Function Level of Independence: Independent               Hand Dominance        Extremity/Trunk Assessment   Upper Extremity Assessment Upper Extremity Assessment: Overall WFL for tasks assessed    Lower Extremity Assessment Lower Extremity Assessment: Overall WFL for tasks assessed    Cervical / Trunk Assessment Cervical / Trunk Assessment: Normal  Communication   Communication: No difficulties  Cognition Arousal/Alertness: Awake/alert Behavior During Therapy: WFL for tasks assessed/performed Overall Cognitive Status: Within Functional Limits for tasks assessed  General Comments      Exercises     Assessment/Plan    PT Assessment Patient needs continued PT services  PT Problem List Decreased activity tolerance;Decreased balance       PT Treatment Interventions Gait training;Functional mobility training;Therapeutic activities;Balance training    PT Goals (Current goals can be found in the Care Plan section)  Acute Rehab PT Goals Patient Stated Goal: understand exactly what is going on and what I can do about it PT Goal  Formulation: With patient/family Time For Goal Achievement: 06/05/19 Potential to Achieve Goals: Good    Frequency Min 2X/week   Barriers to discharge        Co-evaluation               AM-PAC PT "6 Clicks" Mobility  Outcome Measure Help needed turning from your back to your side while in a flat bed without using bedrails?: None Help needed moving from lying on your back to sitting on the side of a flat bed without using bedrails?: None Help needed moving to and from a bed to a chair (including a wheelchair)?: A Little Help needed standing up from a chair using your arms (e.g., wheelchair or bedside chair)?: A Little Help needed to walk in hospital room?: A Little Help needed climbing 3-5 steps with a railing? : A Little 6 Click Score: 20    End of Session   Activity Tolerance: Patient tolerated treatment well Patient left: in bed;with family/visitor present;with call bell/phone within reach   PT Visit Diagnosis: Unsteadiness on feet (R26.81)    Time: [id number] PT Time Calculation (min) (ACUTE ONLY): 22 min   Charges:   PT Evaluation $PT Eval Moderate Complexity: 1 Mod          05/29/2019 [place], PT Acute Rehabilitation Services Pager:  [phone] Office:  [phone]    Shanna Cisco 05/29/2019, 4:41 PM

## 2019-05-29 NOTE — Procedures (Signed)
   Rt pleural effusion  US guided Rt thoracentesis 280 cc thick bloody fluid obtained  Sent for labs per MD  Tolerated well  CXR pending

## 2019-05-29 NOTE — Discharge Instructions (Signed)

## 2019-05-29 NOTE — Progress Notes (Addendum)
PROGRESS NOTE    [name]  [id number] DOB: [birth year] DOA: 05/28/2019 PCP: Leeroy Cha, MD  Brief Narrative: 63 year old female with hypertension dyslipidemia type 2 diabetes, paroxysmal atrial fibrillation, chronic lip lymphocytosis, history of splenectomy -Recently admitted by Dr. Doylene Canard with syncope felt to be mediated by hypoglycemia, diagnosed with atrial fibrillation at the time started on Eliquis 10 days ago -Presented back to the emergency room with worsening dyspnea, found to have right greater than left pleural effusion and CT angiogram which showed mediastinal mass -Previously has had chronic lymphocytosis with an inguinal adenopathy this was biopsied in September 2019 and negative for lymphoproliferative disorders, seen by hematology Dr.Kale then   Assessment & Plan:   Bilateral pleural effusion, right greater than left -Could be diastolic CHF -Versus malignant effusion given concomitant mediastinal mass -Ultrasound thoracentesis in radiology today, follow-up pleural fluid cell count, LDH, protein, cytology and flow cytometry -appreciate Pulm consult -My suspicion for infection is low, will discontinue vancomycin and if pleural fluid Gram stain is negative stop cefepime as well  Anterior mediastinal mass with supraclavicular extension -Concerning for thymic mass versus lymphoma possibly -Her chronic lymphocytosis last year was suspected to be secondary to her splenectomy, inguinal lymph node biopsy was negative then -Will need PET scan post discharge -pulm FU for this  Type 2 diabetes mellitus -CBG stable, continue sliding scale insulin  Paroxysmal atrial fibrillation -In sinus rhythm this morning, continue metoprolol  -Restart Eliquis   DVT prophylaxis: Eliquis Code Status: Full code Family Communication: No family at bedside, discussed with patient Disposition Plan: Home in 1 to 2 days  Consultants:   Pulm   Procedures:   Antimicrobials:     Subjective: -Complains of dyspnea on exertion, mild swelling in her fingers and abdominal wall, no lower extremity edema -Denies any fevers or chills, reports mild intermittent night sweats  Objective: Vitals:   05/28/19 2030 05/29/19 0420 05/29/19 0500 05/29/19 0841  BP: (!) 114/56 123/65  (!) 110/55  Pulse: 74 81  77  Resp: 18 18    Temp: 98.7 F (37.1 C) (!) 100.4 F (38 C)    TempSrc: Oral Oral    SpO2: 97% 94%    Weight:   59.1 kg     Intake/Output Summary (Last 24 hours) at 05/29/2019 1103 Last data filed at 05/29/2019 0900 Gross per 24 hour  Intake 2221.65 ml  Output --  Net 2221.65 ml   Filed Weights   05/29/19 0500  Weight: 59.1 kg    Examination:  General exam: Elderly pleasant female sitting up in bed, AAO x3, no distress Respiratory system: Decreased breath sounds at both bases Cardiovascular system: S1 & S2 heard, RRR. Gastrointestinal system: Abdomen is nondistended, soft and nontender.Normal bowel sounds heard. Central nervous system: Alert and oriented. No focal neurological deficits. Extremities: No edema Skin: No rashes, lesions or ulcers Psychiatry: Judgement and insight appear normal. Mood & affect appropriate.     Data Reviewed:   CBC: Recent Labs  Lab 05/28/19 0833 05/28/19 1204 05/29/19 0340  WBC 14.5* 14.9* 16.3*  NEUTROABS  --  11.3*  --   HGB 10.6* 10.3* 8.5*  HCT 32.9* 31.6* 25.0*  MCV 80.2 78.8* 78.4*  PLT 623* 593* 123456*   Basic Metabolic Panel: Recent Labs  Lab 05/28/19 0833 05/28/19 1251 05/29/19 0340  NA 132*  --  134*  K 4.0  --  4.2  CL 95*  --  99  CO2 22  --  22  GLUCOSE 264*  --  193*  BUN 23  --  25*  CREATININE 1.18*  --  1.25*  CALCIUM 8.9  --  7.9*  MG  --  2.1  --   PHOS  --  2.6  --    GFR: Estimated Creatinine Clearance: 36.1 mL/min (A) (by C-G formula based on SCr of 1.25 mg/dL (H)). Liver Function Tests: Recent Labs  Lab 05/28/19 0833 05/29/19 0340  AST 18 15  ALT 23 17  ALKPHOS 347*  246*  BILITOT 1.4* 0.8  PROT 6.7 5.6*  ALBUMIN 2.6* 2.1*   No results for input(s): LIPASE, AMYLASE in the last 168 hours. No results for input(s): AMMONIA in the last 168 hours. Coagulation Profile: Recent Labs  Lab 05/28/19 0833 05/29/19 0340  INR 2.0* 1.8*   Cardiac Enzymes: No results for input(s): CKTOTAL, CKMB, CKMBINDEX, TROPONINI in the last 168 hours. BNP (last 3 results) No results for input(s): PROBNP in the last 8760 hours. HbA1C: Recent Labs    05/28/19 1430  HGBA1C 9.0*   CBG: Recent Labs  Lab 05/28/19 1752 05/28/19 2123 05/29/19 0754  GLUCAP 231* 178* 176*   Lipid Profile: No results for input(s): CHOL, HDL, LDLCALC, TRIG, CHOLHDL, LDLDIRECT in the last 72 hours. Thyroid Function Tests: Recent Labs    05/28/19 1430  TSH 0.663   Anemia Panel: No results for input(s): VITAMINB12, FOLATE, FERRITIN, TIBC, IRON, RETICCTPCT in the last 72 hours. Urine analysis:    Component Value Date/Time   COLORURINE STRAW (A) 05/15/2019 0736   APPEARANCEUR CLEAR 05/15/2019 0736   LABSPEC 1.003 (L) 05/15/2019 0736   PHURINE 8.0 05/15/2019 0736   GLUCOSEU 50 (A) 05/15/2019 0736   HGBUR SMALL (A) 05/15/2019 0736   BILIRUBINUR NEGATIVE 05/15/2019 0736   KETONESUR NEGATIVE 05/15/2019 0736   PROTEINUR 30 (A) 05/15/2019 0736   UROBILINOGEN 0.2 12/07/2011 1031   NITRITE NEGATIVE 05/15/2019 0736   LEUKOCYTESUR NEGATIVE 05/15/2019 0736   Sepsis Labs: @LABRCNTIP ([id number])  ) Recent Results (from the past 240 hour(s))  SARS Coronavirus 2 [facility] order, Performed in [facility] hospital lab) Nasopharyngeal Nasopharyngeal Swab     Status: None   Collection Time: 05/28/19  7:50 AM   Specimen: Nasopharyngeal Swab  Result Value Ref Range Status   SARS Coronavirus 2 NEGATIVE NEGATIVE Final    Comment: (NOTE) If result is NEGATIVE SARS-CoV-2 target nucleic acids are NOT DETECTED. The SARS-CoV-2 RNA is generally detectable in upper and lower   respiratory specimens during the acute phase of infection. The lowest  concentration of SARS-CoV-2 viral copies this assay can detect is 250  copies / mL. A negative result does not preclude SARS-CoV-2 infection  and should not be used as the sole basis for treatment or other  patient management decisions.  A negative result may occur with  improper specimen collection / handling, submission of specimen other  than nasopharyngeal swab, presence of viral mutation(s) within the  areas targeted by this assay, and inadequate number of viral copies  (<250 copies / mL). A negative result must be combined with clinical  observations, patient history, and epidemiological information. If result is POSITIVE SARS-CoV-2 target nucleic acids are DETECTED. The SARS-CoV-2 RNA is generally detectable in upper and lower  respiratory specimens dur ing the acute phase of infection.  Positive  results are indicative of active infection with SARS-CoV-2.  Clinical  correlation with patient history and other diagnostic information is  necessary to determine patient infection status.  Positive results do  not rule out bacterial infection or  co-infection with other viruses. If result is PRESUMPTIVE POSTIVE SARS-CoV-2 nucleic acids MAY BE PRESENT.   A presumptive positive result was obtained on the submitted specimen  and confirmed on repeat testing.  While 2019 novel coronavirus  (SARS-CoV-2) nucleic acids may be present in the submitted sample  additional confirmatory testing may be necessary for epidemiological  and / or clinical management purposes  to differentiate between  SARS-CoV-2 and other Sarbecovirus currently known to infect humans.  If clinically indicated additional testing with an alternate test  methodology [phone]) is advised. The SARS-CoV-2 RNA is generally  detectable in upper and lower respiratory sp ecimens during the acute  phase of infection. The expected result is Negative. Fact  Sheet for Patients:  StrictlyIdeas.no Fact Sheet for Healthcare Providers: BankingDealers.co.za This test is not yet approved or cleared by the [place] FDA and has been authorized for detection and/or diagnosis of SARS-CoV-2 by FDA under an Emergency Use Authorization (EUA).  This EUA will remain in effect (meaning this test can be used) for the duration of the COVID-19 declaration under Section 564(b)(1) of the Act, 21 U.S.C. section 360bbb-3(b)(1), unless the authorization is terminated or revoked sooner. Performed at [facility], [place] [address] [place], [place] [address]   Blood culture (routine x 2)     Status: None (Preliminary result)   Collection Time: 05/28/19 12:04 PM   Specimen: BLOOD  Result Value Ref Range Status   Specimen Description BLOOD RIGHT ANTECUBITAL  Final   Special Requests   Final    BOTTLES DRAWN AEROBIC AND ANAEROBIC Blood Culture adequate volume   Culture  Setup Time   Final    GRAM POSITIVE COCCI AEROBIC BOTTLE ONLY CRITICAL RESULT CALLED TO, READ BACK BY AND VERIFIED WITH: [place] [id number] FCP Performed at [facility], [place] [address] [place], [place] [address]    Culture GRAM POSITIVE COCCI  Final   Report Status PENDING  Incomplete  Blood Culture ID Panel (Reflexed)     Status: Abnormal   Collection Time: 05/28/19 12:04 PM  Result Value Ref Range Status   Enterococcus species NOT DETECTED NOT DETECTED Final   Listeria monocytogenes NOT DETECTED NOT DETECTED Final   Staphylococcus species DETECTED (A) NOT DETECTED Final    Comment: Methicillin (oxacillin) susceptible coagulase negative staphylococcus. Possible blood culture contaminant (unless isolated from more than one blood culture draw or clinical case suggests pathogenicity). No antibiotic treatment is indicated for blood  culture contaminants. CRITICAL RESULT CALLED TO, READ BACK BY AND VERIFIED WITH: [place] [id number] FCP    Staphylococcus aureus (BCID) NOT DETECTED NOT DETECTED Final   Methicillin resistance NOT DETECTED NOT DETECTED Final   Streptococcus species NOT DETECTED NOT DETECTED Final   Streptococcus agalactiae NOT DETECTED NOT DETECTED Final   Streptococcus pneumoniae NOT DETECTED NOT DETECTED Final   Streptococcus pyogenes NOT DETECTED NOT DETECTED Final   Acinetobacter baumannii NOT DETECTED NOT DETECTED Final   Enterobacteriaceae species NOT DETECTED NOT DETECTED Final   Enterobacter cloacae complex NOT DETECTED NOT DETECTED Final   Escherichia coli NOT DETECTED NOT DETECTED Final   Klebsiella oxytoca NOT DETECTED NOT DETECTED Final   Klebsiella pneumoniae NOT DETECTED NOT DETECTED Final   Proteus species NOT DETECTED NOT DETECTED Final   Serratia marcescens NOT DETECTED NOT DETECTED Final   Haemophilus influenzae NOT DETECTED NOT DETECTED Final   Neisseria meningitidis NOT DETECTED NOT DETECTED Final   Pseudomonas aeruginosa NOT DETECTED NOT DETECTED  Final   Candida albicans NOT DETECTED NOT DETECTED Final   Candida glabrata NOT DETECTED NOT DETECTED Final   Candida krusei NOT DETECTED NOT DETECTED Final   Candida parapsilosis NOT DETECTED NOT DETECTED Final   Candida tropicalis NOT DETECTED NOT DETECTED Final    Comment: Performed at [facility], [place] [address] [place], [place] [address]         Radiology Studies: Dg Chest 1 View  Result Date: 05/29/2019 CLINICAL DATA:  Shortness of breath.  Pleural effusion. EXAM: CHEST  1 VIEW COMPARISON:  Single-view of the chest 05/29/2019. CT chest 05/28/2019. FINDINGS: Small bilateral pleural effusions and basilar airspace disease are worse on the right and unchanged. No pneumothorax. Heart size is normal. IMPRESSION: No change in small bilateral pleural effusions and airspace disease, worse on the right. Electronically Signed   By: Inge Rise M.D.   On: 05/29/2019 10:41   Ct Angio Chest Pe W  And/or Wo Contrast  Result Date: 05/28/2019 CLINICAL DATA:  Short of breath, nausea. EXAM: CT ANGIOGRAPHY CHEST WITH CONTRAST TECHNIQUE: Multidetector CT imaging of the chest was performed using the standard protocol during bolus administration of intravenous contrast. Multiplanar CT image reconstructions and MIPs were obtained to evaluate the vascular anatomy. CONTRAST:  184mL OMNIPAQUE IOHEXOL 350 MG/ML SOLN COMPARISON:  Neck CT 04/02/2018 FINDINGS: Cardiovascular: No filling defects within the pulmonary arteries to suggest acute pulmonary embolism. No acute findings of the aorta or great vessels. No pericardial fluid. Mediastinum/Nodes: No axillary lymphadenopathy. There is ill-defined soft tissue thickening in the anterior mediastinum measuring 2.5 cm in thickness (image 39/5)a. This ill-defined tissue extends into the LEFT supraclavicular space (image 22/5. The tissue planes about the esophagus or poorly defined particularly inferior to the carina. No pericardial effusion Lungs/Pleura: There is a moderate effusion on the RIGHT and small effusion on the LEFT. There is interlobular septal thickening in lower lobes. Motion degradation of the imaging. No clear nodularity. Upper Abdomen: Limited view of the liver, kidneys, pancreas are unremarkable. Normal adrenal glands. Musculoskeletal: No aggressive osseous lesion. Review of the MIP images confirms the above findings. IMPRESSION: 1. Ill-defined soft tissue thickening in the anterior mediastinum extending into the LEFT supraclavicular clavicular nodal station. Concern for lymphoproliferative process or thymic origin neoplasm. Findings are new from comparison neck CT 2019. Consider FDG PET scan to evaluate malignant potential. LEFT supraclavicular tissue may be amenable to biopsy. 2. Bilateral pleural effusions RIGHT greater than LEFT. RIGHT effusion is moderate. 3. Mild interstitial edema. 4. No evidence of acute pulmonary embolism. Electronically Signed   By:  Suzy Bouchard M.D.   On: 05/28/2019 10:57   US Abdomen Complete  Result Date: 05/28/2019 CLINICAL DATA:  Elevated LFTs EXAM: ABDOMEN ULTRASOUND COMPLETE COMPARISON:  04/02/2018 FINDINGS: Gallbladder: Surgically removed Common bile duct: Diameter: 7.5 mm. Liver: No focal lesion identified. Within normal limits in parenchymal echogenicity. Portal vein is patent on color Doppler imaging with normal direction of blood flow towards the liver. IVC: No abnormality visualized. Pancreas: Visualized portion unremarkable. Spleen: Surgically removed Right Kidney: Length: 11.6 cm. Echogenicity within normal limits. No mass or hydronephrosis visualized. Left Kidney: Length: 11.2 cm. Echogenicity within normal limits. No mass or hydronephrosis visualized. Abdominal aorta: No aneurysm visualized. Other findings: Note is made of a right-sided pleural effusion. IMPRESSION: Postsurgical changes. No acute abnormality noted. Electronically Signed   By: Inez Catalina M.D.   On: 05/28/2019 14:01   Dg Chest Port 1 View  Result Date: 05/29/2019 CLINICAL DATA:  Patient  admitted to the hospital yesterday for shortness of breath. EXAM: PORTABLE CHEST 1 VIEW COMPARISON:  PA and lateral chest 05/15/2019. Single view of the chest and CT chest 05/28/2019 FINDINGS: Small bilateral pleural effusions, greater on the right, have increased since yesterday's exam. There is associated compressive basilar atelectasis. Heart size is normal. Aortic atherosclerosis. No pneumothorax. No acute or focal bony abnormality. IMPRESSION: Increased small bilateral pleural effusions and basilar atelectasis, worse on the right. Atherosclerosis. Electronically Signed   By: Inge Rise M.D.   On: 05/29/2019 10:12   Dg Chest Port 1 View  Result Date: 05/28/2019 CLINICAL DATA:  Chest pain and dyspnea EXAM: PORTABLE CHEST 1 VIEW COMPARISON:  05/15/2019 FINDINGS: Cardiac shadows within normal limits. Small right pleural effusion is noted. Mild bibasilar  atelectasis is seen. No bony abnormality is noted. IMPRESSION: Right pleural effusion. Electronically Signed   By: Inez Catalina M.D.   On: 05/28/2019 07:46        Scheduled Meds:  atorvastatin  10 mg Oral q1800   insulin aspart  0-15 Units Subcutaneous TID WC   insulin aspart  0-5 Units Subcutaneous QHS   lidocaine (PF)       metoprolol succinate  25 mg Oral Daily   pantoprazole  40 mg Oral Daily   pregabalin  75 mg Oral BID   sodium chloride flush  3 mL Intravenous Q12H   Continuous Infusions:  ceFEPime (MAXIPIME) IV Stopped (05/29/19 0201)   [START ON 05/30/2019] vancomycin       LOS: 0 days    Time spent: 3min    Domenic Polite, MD [facility]   05/29/2019, 11:03 AM

## 2019-05-30 DIAGNOSIS — R0600 Dyspnea, unspecified: Secondary | ICD-10-CM

## 2019-05-30 DIAGNOSIS — I480 Paroxysmal atrial fibrillation: Secondary | ICD-10-CM

## 2019-05-30 DIAGNOSIS — E1169 Type 2 diabetes mellitus with other specified complication: Secondary | ICD-10-CM

## 2019-05-30 DIAGNOSIS — N179 Acute kidney failure, unspecified: Secondary | ICD-10-CM

## 2019-05-30 LAB — BASIC METABOLIC PANEL
Anion gap: 10 (ref 5–15)
BUN: 23 mg/dL (ref 8–23)
CO2: 23 mmol/L (ref 22–32)
Calcium: 8.4 mg/dL — ABNORMAL LOW (ref 8.9–10.3)
Chloride: 103 mmol/L (ref 98–111)
Creatinine, Ser: 1.07 mg/dL — ABNORMAL HIGH (ref 0.44–1.00)
GFR calc Af Amer: >60 mL/min (ref >60)
GFR calc non Af Amer: 54 mL/min — ABNORMAL LOW (ref >60)
Glucose, Bld: 217 mg/dL — ABNORMAL HIGH (ref 70–99)
Potassium: 4.3 mmol/L (ref 3.5–5.1)
Sodium: 136 mmol/L (ref 135–145)

## 2019-05-30 LAB — CBC
HCT: 26.4 % — ABNORMAL LOW (ref 36.0–46.0)
Hemoglobin: 8.5 g/dL — ABNORMAL LOW (ref 12.0–15.0)
MCH: 25.7 pg — ABNORMAL LOW (ref 26.0–34.0)
MCHC: 32.2 g/dL (ref 30.0–36.0)
MCV: 79.8 fL — ABNORMAL LOW (ref 80.0–100.0)
Platelets: 630 10*3/uL — ABNORMAL HIGH (ref 150–400)
RBC: 3.31 MIL/uL — ABNORMAL LOW (ref 3.87–5.11)
RDW: 15.1 % (ref 11.5–15.5)
WBC: 12.8 10*3/uL — ABNORMAL HIGH (ref 4.0–10.5)
nRBC: 0 % (ref 0.0–0.2)

## 2019-05-30 LAB — GLUCOSE, CAPILLARY
Glucose-Capillary: 300 mg/dL — ABNORMAL HIGH (ref 70–99)
Glucose-Capillary: 129 mg/dL — ABNORMAL HIGH (ref 70–99)
Glucose-Capillary: 206 mg/dL — ABNORMAL HIGH (ref 70–99)
Glucose-Capillary: 198 mg/dL — ABNORMAL HIGH (ref 70–99)

## 2019-05-30 NOTE — Progress Notes (Signed)
PROGRESS NOTE    [name]  [id number] DOB: 1956-09-04 DOA: 05/28/2019 PCP: Leeroy Cha, MD   Brief Narrative:  HPI On 05/28/2019 by Dr. Early Osmond [name] is a 63 y.o. female with medical history significant of hypertension, hyperlipidemia, diabetes mellitus, A. fib, GERD, gout, chronic lymphocytosis, history of cervical cancer presents to emergency department with complaint of chest pain and shortness of breath which started this morning.  Reports that her chest pain is pressure-like, central, nonradiating, no aggravating or relieving factors, associated with shortness of breath, nausea and vomiting.  Denies orthopnea, PND, leg swelling, fever, chills, cough, wheezing, headache, blurry vision, lightheadedness, dizziness, abdominal pain, dysuria, sleep changes or bowel changes.    She admitted here at [facility] 1-1/2 weeks ago with syncope secondary to hypoglycemic episode and new onset A. fib.  She discharged on Eliquis and advised to follow-up with cardiologist and endocrinologist outpatient.  She also had left upper and lower teeth extraction 2 weeks ago-followed up with her dentist and finished antibiotics as prescribed.  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  Has chronic lymphocytosis and inguinal lymphadenopathy-biopsy was done in  9/19 which came back benign.  Has family history of leukemia on her mom's side.  She denies decreased appetite, weight loss or night sweats.  Interim history Anterior mediastinal mass with pleural effusions.  Status post right thoracentesis yielding 280 cc of fluid.  Concern for malignancy versus infection.  Pulmonary also consulted.  Patient also with acute kidney injury which is improved. Assessment & Plan   Dyspnea secondary to bilateral pleural effusions, right greater than left -Presented with shortness of breath, more so with exertion. -Etiology unknown-CTA negative for acute PE -Possibly secondary to  mediastinal mass vs dCHF, infection -Interventional radiology consulted and appreciated, status post ultrasound-guided thoracentesis on the right yielding 280 cc of fluid -Pulmonary consulted and appreciated -Patient was initially placed on vancomycin, has now been transitioned to cefepime -Fluid culture shows no growth to date; Gram stain shows no growth  Anterior mediastinal mass with supraclavicular extension -Last showed ill-defined soft tissue thickening in the anterior mediastinum extending into the left supraclavicular nodal station.  Concern for lymphoproliferative process or thymic origin neoplasm.  Consider PET scan to evaluate malignant potential.  Leukocytosis, thrombocytosis -Possibly secondary to the above -Of note, patient was placed on IV antibiotics, leukocytosis improving -Patient with history of splenectomy and chronic lymphocytosis as well as inguinal lymphadenopathy.  Patient did have previous biopsy which did come back benign.  Diabetes mellitus, type II -Uncontrolled, hemoglobin A1c 9 -Continue insulin sliding scale and CBG monitoring  Paroxysmal atrial fibrillation -Currently rate and rhythm controlled.  Patient in sinus rhythm. -Continue metoprolol, Eliquis  AKI -Creatinine peaked to 1.25, baseline 0.8 -Continue to monitor BMP  Possible chronic diastolic heart failure -Last echocardiogram August 2020 showed an EF of 60 to 65%.  LV diastolic Doppler parameters consistent with impaired relaxation. -BNP on admission 241.6 -Patient does not appear to be hypervolemic at this time.  Currently not hypoxic. -Continue to monitor  GPC -Noted on blood cultures, 1 out of 4 for GPC-blood culture ID panel shows SCN -Suspect contamination  DVT Prophylaxis Eliquis  Code Status: Full  Family Communication: None at bedside  Disposition Plan: Admitted.  Pending improvement patient's breathing as well as weakness.  Likely home on  05/31/2019.  Consultants Pulmonary Interventional radiology  Procedures  Right ultrasound-guided thoracentesis  Antibiotics   Anti-infectives (From admission, onward)   Start  Dose/Rate Route Frequency Ordered Stop   05/30/19 1200  vancomycin (VANCOCIN) 1,250 mg in sodium chloride 0.9 % 250 mL IVPB  Status:  Discontinued     1,250 mg 166.7 mL/hr over 90 Minutes Intravenous Every 48 hours 05/28/19 1315 05/29/19 1109   05/29/19 0200  ceFEPIme (MAXIPIME) 2 g in sodium chloride 0.9 % 100 mL IVPB     2 g 200 mL/hr over 30 Minutes Intravenous Every 12 hours 05/28/19 1315     05/28/19 1315  vancomycin (VANCOCIN) IVPB 1000 mg/200 mL premix     1,000 mg 200 mL/hr over 60 Minutes Intravenous  Once 05/28/19 1309 05/28/19 2223   05/28/19 1315  ceFEPIme (MAXIPIME) 2 g in sodium chloride 0.9 % 100 mL IVPB     2 g 200 mL/hr over 30 Minutes Intravenous  Once 05/28/19 1309 05/28/19 2103      Subjective:   [name] seen and examined today.  Continues to have feeling of shortness of breath especially with exertion.  Also complaining of dry cough.  Denies current chest pain, abdominal pain, nausea or vomiting, diarrhea or constipation, dizziness or headache.  Does complain of feeling very weak and tired.  Objective:   Vitals:   05/29/19 1631 05/29/19 2053 05/30/19 0423 05/30/19 0500  BP: (!) 120/54 (!) 106/48 (!) 135/59   Pulse: 73 72 79   Resp:  12 20   Temp: 99.2 F (37.3 C) 98.8 F (37.1 C) 98.6 F (37 C)   TempSrc: Oral Oral Oral   SpO2: 99% 96% 99%   Weight:  61.7 kg  61.7 kg  Height:        Intake/Output Summary (Last 24 hours) at 05/30/2019 1031 Last data filed at 05/30/2019 0800 Gross per 24 hour  Intake 1517 ml  Output 600 ml  Net 917 ml   Filed Weights   05/29/19 0500 05/29/19 2053 05/30/19 0500  Weight: 59.1 kg 61.7 kg 61.7 kg    Exam  General: Well developed, well nourished, NAD, appears stated age  45: NCAT, mucous membranes moist.   Cardiovascular: S1  S2 auscultated, RRR  Respiratory: Clear to auscultation bilaterally  Abdomen: Soft, nontender, nondistended, + bowel sounds  Extremities: warm dry without cyanosis clubbing or edema  Neuro: AAOx3, nonfocal  Psych: Normal affect and demeanor with intact judgement and insight   Data Reviewed: I have personally reviewed following labs and imaging studies  CBC: Recent Labs  Lab 05/28/19 0833 05/28/19 1204 05/29/19 0340 05/30/19 0454  WBC 14.5* 14.9* 16.3* 12.8*  NEUTROABS  --  11.3*  --   --   HGB 10.6* 10.3* 8.5* 8.5*  HCT 32.9* 31.6* 25.0* 26.4*  MCV 80.2 78.8* 78.4* 79.8*  PLT 623* 593* 574* 123456*   Basic Metabolic Panel: Recent Labs  Lab 05/28/19 0833 05/28/19 1251 05/29/19 0340 05/30/19 0454  NA 132*  --  134* 136  K 4.0  --  4.2 4.3  CL 95*  --  99 103  CO2 22  --  22 23  GLUCOSE 264*  --  193* 217*  BUN 23  --  25* 23  CREATININE 1.18*  --  1.25* 1.07*  CALCIUM 8.9  --  7.9* 8.4*  MG  --  2.1  --   --   PHOS  --  2.6  --   --    GFR: Estimated Creatinine Clearance: 43 mL/min (A) (by C-G formula based on SCr of 1.07 mg/dL (H)). Liver Function Tests: Recent Labs  Lab 05/28/19  [id number] 05/29/19 0340  AST 18 15  ALT 23 17  ALKPHOS 347* 246*  BILITOT 1.4* 0.8  PROT 6.7 5.6*  ALBUMIN 2.6* 2.1*   No results for input(s): LIPASE, AMYLASE in the last 168 hours. No results for input(s): AMMONIA in the last 168 hours. Coagulation Profile: Recent Labs  Lab 05/28/19 0833 05/29/19 0340  INR 2.0* 1.8*   Cardiac Enzymes: No results for input(s): CKTOTAL, CKMB, CKMBINDEX, TROPONINI in the last 168 hours. BNP (last 3 results) No results for input(s): PROBNP in the last 8760 hours. HbA1C: Recent Labs    05/28/19 1430  HGBA1C 9.0*   CBG: Recent Labs  Lab 05/29/19 0754 05/29/19 1204 05/29/19 1705 05/29/19 2055 05/30/19 0813  GLUCAP 176* 137* 223* 216* 300*   Lipid Profile: No results for input(s): CHOL, HDL, LDLCALC, TRIG, CHOLHDL, LDLDIRECT in the  last 72 hours. Thyroid Function Tests: Recent Labs    05/28/19 1430  TSH 0.663   Anemia Panel: No results for input(s): VITAMINB12, FOLATE, FERRITIN, TIBC, IRON, RETICCTPCT in the last 72 hours. Urine analysis:    Component Value Date/Time   COLORURINE STRAW (A) 05/15/2019 0736   APPEARANCEUR CLEAR 05/15/2019 0736   LABSPEC 1.003 (L) 05/15/2019 0736   PHURINE 8.0 05/15/2019 0736   GLUCOSEU 50 (A) 05/15/2019 0736   HGBUR SMALL (A) 05/15/2019 0736   BILIRUBINUR NEGATIVE 05/15/2019 0736   KETONESUR NEGATIVE 05/15/2019 0736   PROTEINUR 30 (A) 05/15/2019 0736   UROBILINOGEN 0.2 12/07/2011 1031   NITRITE NEGATIVE 05/15/2019 0736   LEUKOCYTESUR NEGATIVE 05/15/2019 0736   Sepsis Labs: @LABRCNTIP (procalcitonin:4,lacticidven:4)  ) Recent Results (from the past 240 hour(s))  SARS Coronavirus 2 [facility] order, Performed in [facility] hospital lab) Nasopharyngeal Nasopharyngeal Swab     Status: None   Collection Time: 05/28/19  7:50 AM   Specimen: Nasopharyngeal Swab  Result Value Ref Range Status   SARS Coronavirus 2 NEGATIVE NEGATIVE Final    Comment: (NOTE) If result is NEGATIVE SARS-CoV-2 target nucleic acids are NOT DETECTED. The SARS-CoV-2 RNA is generally detectable in upper and lower  respiratory specimens during the acute phase of infection. The lowest  concentration of SARS-CoV-2 viral copies this assay can detect is 250  copies / mL. A negative result does not preclude SARS-CoV-2 infection  and should not be used as the sole basis for treatment or other  patient management decisions.  A negative result may occur with  improper specimen collection / handling, submission of specimen other  than nasopharyngeal swab, presence of viral mutation(s) within the  areas targeted by this assay, and inadequate number of viral copies  (<250 copies / mL). A negative result must be combined with clinical  observations, patient history, and epidemiological information. If result is  POSITIVE SARS-CoV-2 target nucleic acids are DETECTED. The SARS-CoV-2 RNA is generally detectable in upper and lower  respiratory specimens dur ing the acute phase of infection.  Positive  results are indicative of active infection with SARS-CoV-2.  Clinical  correlation with patient history and other diagnostic information is  necessary to determine patient infection status.  Positive results do  not rule out bacterial infection or co-infection with other viruses. If result is PRESUMPTIVE POSTIVE SARS-CoV-2 nucleic acids MAY BE PRESENT.   A presumptive positive result was obtained on the submitted specimen  and confirmed on repeat testing.  While 2019 novel coronavirus  (SARS-CoV-2) nucleic acids may be present in the submitted sample  additional confirmatory testing may be necessary for epidemiological  and /  or clinical management purposes  to differentiate between  SARS-CoV-2 and other Sarbecovirus currently known to infect humans.  If clinically indicated additional testing with an alternate test  methodology [phone]) is advised. The SARS-CoV-2 RNA is generally  detectable in upper and lower respiratory sp ecimens during the acute  phase of infection. The expected result is Negative. Fact Sheet for Patients:  StrictlyIdeas.no Fact Sheet for Healthcare Providers: BankingDealers.co.za This test is not yet approved or cleared by the [place] FDA and has been authorized for detection and/or diagnosis of SARS-CoV-2 by FDA under an Emergency Use Authorization (EUA).  This EUA will remain in effect (meaning this test can be used) for the duration of the COVID-19 declaration under Section 564(b)(1) of the Act, 21 U.S.C. section 360bbb-3(b)(1), unless the authorization is terminated or revoked sooner. Performed at [facility], [place] [address], [place], [place] [address]   Blood culture (routine x 2)     Status: None  (Preliminary result)   Collection Time: 05/28/19 12:04 PM   Specimen: BLOOD  Result Value Ref Range Status   Specimen Description BLOOD RIGHT ANTECUBITAL  Final   Special Requests   Final    BOTTLES DRAWN AEROBIC AND ANAEROBIC Blood Culture adequate volume   Culture  Setup Time   Final    GRAM POSITIVE COCCI AEROBIC BOTTLE ONLY CRITICAL RESULT CALLED TO, READ BACK BY AND VERIFIED WITH: Holiday [id number] FCP Performed at [facility], [place] [address], [place], [place] [address]    Culture GRAM POSITIVE COCCI  Final   Report Status PENDING  Incomplete  Blood culture (routine x 2)     Status: None (Preliminary result)   Collection Time: 05/28/19 12:04 PM   Specimen: BLOOD LEFT HAND  Result Value Ref Range Status   Specimen Description BLOOD LEFT HAND  Final   Special Requests   Final    BOTTLES DRAWN AEROBIC ONLY Blood Culture results may not be optimal due to an inadequate volume of blood received in culture bottles   Culture   Final    NO GROWTH 2 DAYS Performed at [facility], [place] [address], [place], [place] [address]    Report Status PENDING  Incomplete  Blood Culture ID Panel (Reflexed)     Status: Abnormal   Collection Time: 05/28/19 12:04 PM  Result Value Ref Range Status   Enterococcus species NOT DETECTED NOT DETECTED Final   Listeria monocytogenes NOT DETECTED NOT DETECTED Final   Staphylococcus species DETECTED (A) NOT DETECTED Final    Comment: Methicillin (oxacillin) susceptible coagulase negative staphylococcus. Possible blood culture contaminant (unless isolated from more than one blood culture draw or clinical case suggests pathogenicity). No antibiotic treatment is indicated for blood  culture contaminants. CRITICAL RESULT CALLED TO, READ BACK BY AND VERIFIED WITH: PHARMD ELIZABETH MARTIN 1015 [id number] FCP    Staphylococcus aureus (BCID) NOT DETECTED NOT DETECTED Final   Methicillin resistance NOT DETECTED NOT DETECTED Final    Streptococcus species NOT DETECTED NOT DETECTED Final   Streptococcus agalactiae NOT DETECTED NOT DETECTED Final   Streptococcus pneumoniae NOT DETECTED NOT DETECTED Final   Streptococcus pyogenes NOT DETECTED NOT DETECTED Final   Acinetobacter baumannii NOT DETECTED NOT DETECTED Final   Enterobacteriaceae species NOT DETECTED NOT DETECTED Final   Enterobacter cloacae complex NOT DETECTED NOT DETECTED Final   Escherichia coli NOT DETECTED NOT DETECTED Final   Klebsiella oxytoca NOT DETECTED NOT DETECTED Final   Klebsiella pneumoniae NOT DETECTED  NOT DETECTED Final   Proteus species NOT DETECTED NOT DETECTED Final   Serratia marcescens NOT DETECTED NOT DETECTED Final   Haemophilus influenzae NOT DETECTED NOT DETECTED Final   Neisseria meningitidis NOT DETECTED NOT DETECTED Final   Pseudomonas aeruginosa NOT DETECTED NOT DETECTED Final   Candida albicans NOT DETECTED NOT DETECTED Final   Candida glabrata NOT DETECTED NOT DETECTED Final   Candida krusei NOT DETECTED NOT DETECTED Final   Candida parapsilosis NOT DETECTED NOT DETECTED Final   Candida tropicalis NOT DETECTED NOT DETECTED Final    Comment: Performed at [facility], [place] [address] [place], [place] [address]  Culture, body fluid-bottle     Status: None (Preliminary result)   Collection Time: 05/29/19 10:31 AM   Specimen: Pleura  Result Value Ref Range Status   Specimen Description PLEURAL RIGHT  Final   Special Requests NONE  Final   Culture   Final    NO GROWTH < 24 HOURS Performed at [facility], [place] [address], [place], [place] [address]    Report Status PENDING  Incomplete  Gram stain     Status: None   Collection Time: 05/29/19 10:31 AM   Specimen: Pleura  Result Value Ref Range Status   Specimen Description PLEURAL RIGHT  Final   Special Requests NONE  Final   Gram Stain   Final    NO WBC SEEN NO ORGANISMS SEEN Performed at [facility], [address] [place], [place] [address]    Report  Status 05/29/2019 FINAL  Final      Radiology Studies: Dg Chest 1 View  Result Date: 05/29/2019 CLINICAL DATA:  Shortness of breath.  Pleural effusion. EXAM: CHEST  1 VIEW COMPARISON:  Single-view of the chest 05/29/2019. CT chest 05/28/2019. FINDINGS: Small bilateral pleural effusions and basilar airspace disease are worse on the right and unchanged. No pneumothorax. Heart size is normal. IMPRESSION: No change in small bilateral pleural effusions and airspace disease, worse on the right. Electronically Signed   By: Inge Rise M.D.   On: 05/29/2019 10:41   US Abdomen Complete  Result Date: 05/28/2019 CLINICAL DATA:  Elevated LFTs EXAM: ABDOMEN ULTRASOUND COMPLETE COMPARISON:  04/02/2018 FINDINGS: Gallbladder: Surgically removed Common bile duct: Diameter: 7.5 mm. Liver: No focal lesion identified. Within normal limits in parenchymal echogenicity. Portal vein is patent on color Doppler imaging with normal direction of blood flow towards the liver. IVC: No abnormality visualized. Pancreas: Visualized portion unremarkable. Spleen: Surgically removed Right Kidney: Length: 11.6 cm. Echogenicity within normal limits. No mass or hydronephrosis visualized. Left Kidney: Length: 11.2 cm. Echogenicity within normal limits. No mass or hydronephrosis visualized. Abdominal aorta: No aneurysm visualized. Other findings: Note is made of a right-sided pleural effusion. IMPRESSION: Postsurgical changes. No acute abnormality noted. Electronically Signed   By: Inez Catalina M.D.   On: 05/28/2019 14:01   Dg Chest Port 1 View  Result Date: 05/29/2019 CLINICAL DATA:  Patient admitted to the hospital yesterday for shortness of breath. EXAM: PORTABLE CHEST 1 VIEW COMPARISON:  PA and lateral chest 05/15/2019. Single view of the chest and CT chest 05/28/2019 FINDINGS: Small bilateral pleural effusions, greater on the right, have increased since yesterday's exam. There is associated compressive basilar atelectasis. Heart size  is normal. Aortic atherosclerosis. No pneumothorax. No acute or focal bony abnormality. IMPRESSION: Increased small bilateral pleural effusions and basilar atelectasis, worse on the right. Atherosclerosis. Electronically Signed   By: Inge Rise M.D.   On: 05/29/2019  10:12   US Thoracentesis Asp Pleural Space W/img Guide  Result Date: 05/29/2019 INDICATION: Symptomatic right sided pleural effusion EXAM: US THORACENTESIS ASP PLEURAL SPACE W/IMG GUIDE COMPARISON:  None. MEDICATIONS: 10 cc 1% lidocaine COMPLICATIONS: None immediate. TECHNIQUE: Informed written consent was obtained from the patient after a discussion of the risks, benefits and alternatives to treatment. A timeout was performed prior to the initiation of the procedure. Initial ultrasound scanning demonstrates a right pleural effusion. The lower chest was prepped and draped in the usual sterile fashion. 1% lidocaine was used for local anesthesia. Under direct ultrasound guidance, a 19 gauge, 7-cm, Yueh catheter was introduced. An ultrasound image was saved for documentation purposes. The thoracentesis was performed. The catheter was removed and a dressing was applied. The patient tolerated the procedure well without immediate post procedural complication. The patient was escorted to have an upright chest radiograph. FINDINGS: A total of approximately 280 cc of thick bloody fluid was removed. Requested samples were sent to the laboratory. IMPRESSION: Successful ultrasound-guided right sided thoracentesis yielding 280 cc of pleural fluid. Read by Lavonia Drafts [facility] Electronically Signed   By: Markus Daft M.D.   On: 05/29/2019 10:24     Scheduled Meds:  apixaban  5 mg Oral BID   atorvastatin  10 mg Oral q1800   insulin aspart  0-15 Units Subcutaneous TID WC   insulin aspart  0-5 Units Subcutaneous QHS   metoprolol succinate  25 mg Oral Daily   pantoprazole  40 mg Oral Daily   pregabalin  75 mg Oral BID   sodium chloride flush  3  mL Intravenous Q12H   Continuous Infusions:  ceFEPime (MAXIPIME) IV Stopped (05/30/19 0156)     LOS: 1 day   Time Spent in minutes   45 minutes  Lorisa Scheid D.O. on 05/30/2019 at 10:31 AM  Between 7am to 7pm - Please see pager noted on [email or web address]  After 7pm go to [email or web address]  And look for the night coverage person covering for me after hours  [facility] Office  [phone]

## 2019-05-30 NOTE — Progress Notes (Signed)
NAME:  [name], MRN:  [id number], DOB:  05/22/1956, LOS: 1 ADMISSION DATE:  05/28/2019, CONSULTATION DATE: 9/4 REFERRING MD:  Doristine Bosworth, CHIEF COMPLAINT:  Pleural effusion   Brief History   63 year old female patient with right greater than left pleural effusion and anterior mediastinal mass Prior history of chronic lymphocytosis and lymphadenopathy with negative biopsy of inguinal lymph node in 2019   History of present illness   63 year old female patient who was recently discharged from the hospital after syncopal episode.  She apparently had had several teeth removed, had significant pain and discomfort and was not taking in p.o. intake.  Her blood glucose was 27 with altered sensorium resulting in a fall.  She was admitted, was found to have new onset atrial fibrillation, started on rate control, as well as Eliquis 5 mg p.o. twice daily she was observed for 48 hours after blood glucose was regulated and discharged home on 8/24. She presents once again to the emergency room on 9/4 with chief complaint of shortness of breath.  She was in her usual state of health with the exception of some degree of weakness ever since her hospital discharge.  She did recently go to her dentist for her suture removal on 9/2 apparently at that point he felt as though she may have had some postnasal drainage and was worried about a sinus infection and therefore placed her on a azithromycin. On 9/3 in the early afternoon she had rather sudden onset of chest discomfort which she described as an elephant sitting on her chest.  It was associated with diaphoresis and mild shortness of breath.  Not long after that she began to have intermittent episodes of nausea and retching.  She tried to take it easy for the rest of the day, did not eat much, about 4 AM she awoke with worsening shortness of breath on 9/4 and therefore presented to the emergency room. She denied fever, chills, recent cough, wheezing, other chest pain.   No heart palpitations, no lower extremity swelling.  In the emergency room she was found to have a mild leukocytosis, low-grade fever, and chest x-ray with question of pleural effusion.  She later underwent a CT of chest which was negative for pulmonary emboli but did demonstrate a mediastinal mass as well as bilateral right greater than left pleural effusions.  She was placed on antibiotics Given supplemental oxygen and IV Lasix Pulmonary asked to evaluate for new pleural effusion  Past Medical History  Atrial fibrillation, new onset.  Hypertension, type 2 diabetes, hyperlipidemia, recent syncope  Significant Hospital Events     Consults:  pulm 9/4  IR consult   Procedures:    Significant Diagnostic Tests:   CT chest 9/4:1. Ill-defined soft tissue thickening in the anterior mediastinum extending into the LEFT supraclavicular clavicular nodal station. Concern for lymphoproliferative process or thymic origin neoplasm. Findings are new from comparison neck CT 2019. Consider FDG PET scan to evaluate malignant potential. LEFT supraclavicular tissue may be amenable to biopsy. 2. Bilateral pleural effusions RIGHT greater than LEFT. RIGHT effusion is moderate. 3. Mild interstitial edema. 4. No evidence of acute pulmonary embolism.  Echo 8/23: 1. The left ventricle has normal systolic function with an ejection fraction of 60-65%. The cavity size was normal. There is mild concentric left ventricular hypertrophy. Left ventricular diastolic Doppler parameters are consistent with impaired  Relaxation. 2. The right ventricle has normal systolic function. The cavity was normal. There is no increase in right ventricular wall thickness. Right  ventricular systolic pressure is normal.  3. The mitral valve is degenerative. Mild thickening of the mitral valve leaflet. Mild calcification of the mitral valve leaflet. There is mild mitral annular calcification present.  4. The aortic valve is tricuspid. Mild  thickening of the aortic valve. Mild calcification of the aortic valve. Aortic valve regurgitation was not assessed by color flow Doppler.  5. There is evidence of moderate plaque in the ascending aorta and aortic root. Micro Data:  covid 9/4: neg   Antimicrobials:  Vancomycin 9/4 Cefepime 9/4  Interim history/subjective:   Afebrile Was able to walk without desaturation  Objective   Blood pressure (!) 135/59, pulse 79, temperature 98.6 F (37 C), temperature source Oral, resp. rate 20, height 5' (1.524 m), weight 61.7 kg, SpO2 99 %.        Intake/Output Summary (Last 24 hours) at 05/30/2019 1132 Last data filed at 05/30/2019 1121 Gross per 24 hour  Intake 1517 ml  Output 950 ml  Net 567 ml   Filed Weights   05/29/19 0500 05/29/19 2053 05/30/19 0500  Weight: 59.1 kg 61.7 kg 61.7 kg    Examination: General: Elderly woman, no distress HENT: Normocephalic atraumatic no jugular venous distention, no supraclavicular lymphadenopathy Lungs: Clear, decreased right base Cardiovascular: Regular rate and rhythm, Abdomen: Soft, not tender, no organomegaly Extremities: Warm and dry brisk capillary refill no edema Neuro: Awake and oriented GU: Voids  Resolved Hospital Problem list     Assessment & Plan:  Fluid does not appear to be infectious, could be inflammatory, cytology is pending   Right greater than left pleural effusion -Reviewed fluid results-protein slight high at 3.2 but LDH normal, few cells mostly mononuclear Plan Follow-up chest x-ray as outpatient Follow-up pleural fluid cytology  Anterior mediastinal mass Plan Outpatient PET scan  If positive would need IR consult for biopsy FU Dr Irene Limbo  History of atrial fibrillation Plan Rate control Can resume Eliquis  PCCM available as needed  Kara Mead MD. Shade Flood. [facility] Pager [phone] If no response call [phone]   05/30/2019

## 2019-05-31 LAB — CBC
HCT: 29.5 % — ABNORMAL LOW (ref 36.0–46.0)
Hemoglobin: 9.4 g/dL — ABNORMAL LOW (ref 12.0–15.0)
MCH: 25.5 pg — ABNORMAL LOW (ref 26.0–34.0)
MCHC: 31.9 g/dL (ref 30.0–36.0)
MCV: 80.2 fL (ref 80.0–100.0)
Platelets: 783 10*3/uL — ABNORMAL HIGH (ref 150–400)
RBC: 3.68 MIL/uL — ABNORMAL LOW (ref 3.87–5.11)
RDW: 15.1 % (ref 11.5–15.5)
WBC: 13.7 10*3/uL — ABNORMAL HIGH (ref 4.0–10.5)
nRBC: 0 % (ref 0.0–0.2)

## 2019-05-31 LAB — CULTURE, BLOOD (ROUTINE X 2): Special Requests: ADEQUATE

## 2019-05-31 LAB — BASIC METABOLIC PANEL
Anion gap: 8 (ref 5–15)
BUN: 19 mg/dL (ref 8–23)
CO2: 25 mmol/L (ref 22–32)
Calcium: 8.8 mg/dL — ABNORMAL LOW (ref 8.9–10.3)
Chloride: 102 mmol/L (ref 98–111)
Creatinine, Ser: 0.96 mg/dL (ref 0.44–1.00)
GFR calc Af Amer: >60 mL/min (ref >60)
GFR calc non Af Amer: >60 mL/min (ref >60)
Glucose, Bld: 242 mg/dL — ABNORMAL HIGH (ref 70–99)
Potassium: 4.4 mmol/L (ref 3.5–5.1)
Sodium: 135 mmol/L (ref 135–145)

## 2019-05-31 LAB — PH, BODY FLUID: pH, Body Fluid: 7.7

## 2019-05-31 LAB — GLUCOSE, CAPILLARY
Glucose-Capillary: 248 mg/dL — ABNORMAL HIGH (ref 70–99)
Glucose-Capillary: 284 mg/dL — ABNORMAL HIGH (ref 70–99)
Glucose-Capillary: 211 mg/dL — ABNORMAL HIGH (ref 70–99)

## 2019-05-31 MED ORDER — TRAMADOL HCL 50 MG PO TABS
50.0000 mg | ORAL_TABLET | Freq: Four times a day (QID) | ORAL | Status: DC | PRN
  Administered 2019-05-31 (×2): 50 mg via ORAL
  Filled 2019-05-31 (×2): qty 1

## 2019-05-31 NOTE — Discharge Summary (Signed)
Physician Discharge Summary  [name] ZCH:[id number] DOB: 09-27-1955 DOA: 05/28/2019  PCP: Leeroy Cha, MD  Admit date: 05/28/2019 Discharge date: 05/31/2019  Time spent: 45 minutes  Recommendations for Outpatient Follow-up:  Patient will be discharged to home.  Patient will need to follow up with primary care provider within one week of discharge.  Follow up with Dr. Irene Limbo and Dr. Doylene Canard.  Patient should continue medications as prescribed.  Patient should follow a heart healthy/carb modified diet.   Discharge Diagnoses:  Dyspnea secondary to bilateral pleural effusions, right greater than left Anterior mediastinal mass with supraclavicular extension Leukocytosis, thrombocytosis Diabetes mellitus, type II Paroxysmal atrial fibrillation AKI Possible chronic diastolic heart failure GPC  Discharge Condition: Stable  Diet recommendation: heart healthy/carb modified  Filed Weights   05/29/19 0500 05/29/19 2053 05/30/19 0500  Weight: 59.1 kg 61.7 kg 61.7 kg    History of present illness:  On 05/28/2019 by Dr. Lily Peer a 63 y.o.femalewith medical history significant ofhypertension, hyperlipidemia, diabetes mellitus, A. fib, GERD, gout, chronic lymphocytosis, history of cervical cancer presents to emergency department with complaint of chest pain and shortness of breath which started this morning. Reports that her chest pain is pressure-like, central, nonradiating, no aggravating or relieving factors, associated with shortness of breath, nausea and vomiting. Denies orthopnea, PND, leg swelling, fever, chills, cough, wheezing, headache, blurry vision, lightheadedness, dizziness, abdominal pain, dysuria, sleep changesor bowel changes.   She admitted here at [facility] 1-1/2 weeks ago with syncope secondary to hypoglycemic episode andnew onsetA. fib. She discharged on Eliquis and advised to follow-up with cardiologist and endocrinologist outpatient. She  also had left upper and lower teeth extraction 2 weeks ago-followed up with her dentist and finished antibiotics as prescribed.  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 Has chronic lymphocytosis and inguinal lymphadenopathy-biopsy was done in9/19which came back benign. Has family history of leukemia on her mom's side. She denies decreased appetite, weight loss or night sweats.  Hospital Course:  Dyspnea secondary to bilateral pleural effusions, right greater than left -Presented with shortness of breath, more so with exertion. -Etiology unknown-CTA negative for acute PE -Possibly secondary to mediastinal mass vs dCHF, infection -Interventional radiology consulted and appreciated, status post ultrasound-guided thoracentesis on the right yielding 280 cc of fluid- appears to be transudative -Pulmonary consulted and appreciated- feels this may be inflammatory -Patient was initially placed on vancomycin,  transitioned to cefepime -Fluid culture shows no growth to date; Gram stain shows no growth  Anterior mediastinal mass with supraclavicular extension -Last showed ill-defined soft tissue thickening in the anterior mediastinum extending into the left supraclavicular nodal station.  Concern for lymphoproliferative process or thymic origin neoplasm.  Consider PET scan to evaluate malignant potential. -sent message for follow up with Dr. Irene Limbo  Leukocytosis, thrombocytosis -Possibly secondary to the above -Of note, patient was placed on IV antibiotics, leukocytosis improving -Patient with history of splenectomy and chronic lymphocytosis as well as inguinal lymphadenopathy.  Patient did have previous biopsy which did come back benign.  Diabetes mellitus, type II -Uncontrolled, hemoglobin A1c 9 -Continue insulin sliding scale and CBG monitoring  Paroxysmal atrial fibrillation -Currently rate and rhythm controlled.  Patient in sinus rhythm. -Continue  metoprolol, Eliquis  AKI -Creatinine peaked to 1.25, baseline 0.8 -Currently 0.96  Possible chronic diastolic heart failure -Last echocardiogram August 2020 showed an EF of 60 to 65%.  LV diastolic Doppler parameters consistent with impaired relaxation. -BNP on admission 241.6 -Patient does not appear  to be hypervolemic at this time.  Currently not hypoxic. -Follow up with Dr. Doylene Canard  [facility] -Noted on blood cultures, 1 out of 4 for GPC-blood culture ID panel shows SCN -Suspect contamination  Consultants Pulmonary Interventional radiology  Procedures  Right ultrasound-guided thoracentesis  Discharge Exam: Vitals:   05/30/19 2015 05/31/19 0445  BP: (!) 148/63 134/63  Pulse: 78 82  Resp: 18 18  Temp: 100 F (37.8 C) 99 F (37.2 C)  SpO2: 99% 95%     General: Well developed, well nourished, NAD  HEENT: NCAT, mucous membranes moist.  Cardiovascular: S1 S2 auscultated, RRR  Respiratory: Clear to auscultation bilaterally   Abdomen: Soft, nontender, nondistended, + bowel sounds  Extremities: warm dry without cyanosis clubbing or edema  Neuro: AAOx3, nonfocal  Psych: Pleasant, appropriate mood and affect  Discharge Instructions Discharge Instructions    Discharge instructions   Complete by: As directed    Patient will be discharged to home.  Patient will need to follow up with primary care provider within one week of discharge.  Follow up with Dr. Irene Limbo and Dr. Doylene Canard.  Patient should continue medications as prescribed.  Patient should follow a heart healthy/carb modified diet.     Allergies as of 05/31/2019      Reactions   Gabapentin Other (See Comments)   Caused upset stomach   Penicillins Hives, Other (See Comments)   Has patient had a PCN reaction causing immediate rash, facial/tongue/throat swelling, SOB or lightheadedness with hypotension: Unknown Has patient had a PCN reaction causing severe rash involving mucus membranes or skin necrosis: Unknown Has  patient had a PCN reaction that required hospitalization: Unknown Has patient had a PCN reaction occurring within the last 10 years: No If all of the above answers are "NO", then may proceed with Cephalosporin use.   Pepcid [famotidine] Other (See Comments)   HEADACHE      Medication List    STOP taking these medications   azithromycin 250 MG tablet Commonly known as: ZITHROMAX     TAKE these medications   apixaban 5 MG Tabs tablet Commonly known as: ELIQUIS Take 1 tablet (5 mg total) by mouth 2 (two) times daily.   atorvastatin 10 MG tablet Commonly known as: LIPITOR Take 10 mg by mouth daily at 6 PM.   clonazePAM 1 MG tablet Commonly known as: KLONOPIN Take 0.5-1 mg by mouth daily as needed for anxiety.   fexofenadine 180 MG tablet Commonly known as: ALLEGRA Take 180 mg by mouth daily as needed for allergies.   glucose blood test strip Commonly known as: Electronics engineer BLOOD SUGAR 3 TIMES DAILY-DX-E11.9 AND Z79.4.   insulin NPH-regular Human (70-30) 100 UNIT/ML injection Inject 5-6 Units into the skin See admin instructions. Take 6 units in the morning, and 5 units at night. Hold insulin if blood sugar is less than 99 mg.  Decrease to 3 units if blood sugar is 100 to 140 mg.   losartan 25 MG tablet Commonly known as: COZAAR Take 1 tablet (25 mg total) by mouth daily.   metoprolol succinate 25 MG 24 hr tablet Commonly known as: TOPROL-XL Take 1 tablet (25 mg total) by mouth daily.   omeprazole 20 MG capsule Commonly known as: PRILOSEC Take 20 mg by mouth daily.   OneTouch Delica Lancets 65H Misc Check blood sugar 3 times daily-DX-E11.9 and Z79.4.   OneTouch Verio w/Device Kit 1 kit by Does not apply route 3 (three) times daily.   pregabalin 75 MG capsule Commonly  known as: LYRICA TAKE 1 CAPSULE BY MOUTH TWICE A DAY What changed: when to take this   Systane Complete 0.6 % Soln Generic drug: Propylene Glycol Place 1 drop into both eyes 2 (two)  times daily as needed (for dry eyes).   traMADol 50 MG tablet Commonly known as: Ultram Take 1 tablet (50 mg total) by mouth every 6 (six) hours as needed. What changed: reasons to take this   Vitamin D-3 125 MCG (5000 UT) Tabs Take 5,000 tablets by mouth every evening.      Allergies  Allergen Reactions   Gabapentin Other (See Comments)    Caused upset stomach   Penicillins Hives and Other (See Comments)    Has patient had a PCN reaction causing immediate rash, facial/tongue/throat swelling, SOB or lightheadedness with hypotension: Unknown Has patient had a PCN reaction causing severe rash involving mucus membranes or skin necrosis: Unknown Has patient had a PCN reaction that required hospitalization: Unknown Has patient had a PCN reaction occurring within the last 10 years: No If all of the above answers are "NO", then may proceed with Cephalosporin use.    Pepcid [Famotidine] Other (See Comments)    HEADACHE   Follow-up Information    Leeroy Cha, MD. Schedule an appointment as soon as possible for a visit in 1 week(s).   Specialty: Internal Medicine Why: Hospital follow up Contact information: [address] [place] [address] [phone]        Brunetta Genera, MD. Schedule an appointment as soon as possible for a visit in 1 week(s).   Specialties: Hematology, Oncology Why: Hosptial follow up Contact information: [place] [address] [phone]        Dixie Dials, MD. Schedule an appointment as soon as possible for a visit in 1 week(s).   Specialty: Cardiology Why: Hospital follow up Contact information: [place] [address] [phone]            The results of significant diagnostics from this hospitalization (including imaging, microbiology, ancillary and laboratory) are listed below for reference.    Significant Diagnostic Studies: Dg Chest 1  View  Result Date: 05/29/2019 CLINICAL DATA:  Shortness of breath.  Pleural effusion. EXAM: CHEST  1 VIEW COMPARISON:  Single-view of the chest 05/29/2019. CT chest 05/28/2019. FINDINGS: Small bilateral pleural effusions and basilar airspace disease are worse on the right and unchanged. No pneumothorax. Heart size is normal. IMPRESSION: No change in small bilateral pleural effusions and airspace disease, worse on the right. Electronically Signed   By: Inge Rise M.D.   On: 05/29/2019 10:41   X-ray Chest Pa And Lateral  Result Date: 05/15/2019 CLINICAL DATA:  Syncope, collapse EXAM: CHEST - 2 VIEW COMPARISON:  02/03/2013 FINDINGS: The heart size and mediastinal contours are within normal limits. Both lungs are clear. The visualized skeletal structures are unremarkable. IMPRESSION: No acute abnormality of the lungs. Electronically Signed   By: Eddie Candle M.D.   On: 05/15/2019 13:38   Ct Head Wo Contrast  Result Date: 05/15/2019 CLINICAL DATA:  Altered mental status EXAM: CT HEAD WITHOUT CONTRAST TECHNIQUE: Contiguous axial images were obtained from the base of the skull through the vertex without intravenous contrast. COMPARISON:  05/31/2010 FINDINGS: Brain: No evidence of acute infarction, hemorrhage, hydrocephalus, extra-axial collection or mass lesion/mass effect. Vascular: No hyperdense vessel or unexpected calcification. Skull: Normal. Negative for fracture or focal lesion. Sinuses/Orbits: No acute finding. Other: None. IMPRESSION: No acute intracranial  pathology. Electronically Signed   By: Eddie Candle M.D.   On: 05/15/2019 14:12   Ct Angio Chest Pe W And/or Wo Contrast  Result Date: 05/28/2019 CLINICAL DATA:  Short of breath, nausea. EXAM: CT ANGIOGRAPHY CHEST WITH CONTRAST TECHNIQUE: Multidetector CT imaging of the chest was performed using the standard protocol during bolus administration of intravenous contrast. Multiplanar CT image reconstructions and MIPs were obtained to evaluate the  vascular anatomy. CONTRAST:  176m OMNIPAQUE IOHEXOL 350 MG/ML SOLN COMPARISON:  Neck CT 04/02/2018 FINDINGS: Cardiovascular: No filling defects within the pulmonary arteries to suggest acute pulmonary embolism. No acute findings of the aorta or great vessels. No pericardial fluid. Mediastinum/Nodes: No axillary lymphadenopathy. There is ill-defined soft tissue thickening in the anterior mediastinum measuring 2.5 cm in thickness (image 39/5)a. This ill-defined tissue extends into the LEFT supraclavicular space (image 22/5. The tissue planes about the esophagus or poorly defined particularly inferior to the carina. No pericardial effusion Lungs/Pleura: There is a moderate effusion on the RIGHT and small effusion on the LEFT. There is interlobular septal thickening in lower lobes. Motion degradation of the imaging. No clear nodularity. Upper Abdomen: Limited view of the liver, kidneys, pancreas are unremarkable. Normal adrenal glands. Musculoskeletal: No aggressive osseous lesion. Review of the MIP images confirms the above findings. IMPRESSION: 1. Ill-defined soft tissue thickening in the anterior mediastinum extending into the LEFT supraclavicular clavicular nodal station. Concern for lymphoproliferative process or thymic origin neoplasm. Findings are new from comparison neck CT 2019. Consider FDG PET scan to evaluate malignant potential. LEFT supraclavicular tissue may be amenable to biopsy. 2. Bilateral pleural effusions RIGHT greater than LEFT. RIGHT effusion is moderate. 3. Mild interstitial edema. 4. No evidence of acute pulmonary embolism. Electronically Signed   By: SSuzy BouchardM.D.   On: 05/28/2019 10:57   UKoreaAbdomen Complete  Result Date: 05/28/2019 CLINICAL DATA:  Elevated LFTs EXAM: ABDOMEN ULTRASOUND COMPLETE COMPARISON:  04/02/2018 FINDINGS: Gallbladder: Surgically removed Common bile duct: Diameter: 7.5 mm. Liver: No focal lesion identified. Within normal limits in parenchymal echogenicity.  Portal vein is patent on color Doppler imaging with normal direction of blood flow towards the liver. IVC: No abnormality visualized. Pancreas: Visualized portion unremarkable. Spleen: Surgically removed Right Kidney: Length: 11.6 cm. Echogenicity within normal limits. No mass or hydronephrosis visualized. Left Kidney: Length: 11.2 cm. Echogenicity within normal limits. No mass or hydronephrosis visualized. Abdominal aorta: No aneurysm visualized. Other findings: Note is made of a right-sided pleural effusion. IMPRESSION: Postsurgical changes. No acute abnormality noted. Electronically Signed   By: MInez CatalinaM.D.   On: 05/28/2019 14:01   Nm Myocar Multi W/spect W/wall Motion / Ef  Result Date: 05/17/2019 CLINICAL DATA:  Hypertension. Diabetes. Hyperlipidemia. Acute coronary syndrome. EXAM: MYOCARDIAL IMAGING WITH SPECT (REST AND PHARMACOLOGIC-STRESS) GATED LEFT VENTRICULAR WALL MOTION STUDY LEFT VENTRICULAR EJECTION FRACTION TECHNIQUE: Standard myocardial SPECT imaging was performed after resting intravenous injection of 10 mCi Tc-929metrofosmin. Subsequently, intravenous infusion of Lexiscan was performed under the supervision of the Cardiology staff. At peak effect of the drug, 30 mCi Tc-9983mtrofosmin was injected intravenously and standard myocardial SPECT imaging was performed. Quantitative gated imaging was also performed to evaluate left ventricular wall motion, and estimate left ventricular ejection fraction. COMPARISON:  None. FINDINGS: Perfusion: Faintly reduced anterior wall activity on stress and rest images could potentially be from mild scarring or breast attenuation. No inducible ischemia. Wall Motion: Normal left ventricular wall motion. No left ventricular dilation. Left Ventricular Ejection Fraction: 68 % End diastolic volume  78 ml End systolic volume 25 ml IMPRESSION: 1. No inducible ischemia identified. Faintly reduced matched anterior wall activity on stress and rest images could be  from breast attenuation or mild scarring. 2. Normal left ventricular wall motion. 3. Left ventricular ejection fraction 68% 4. Non invasive risk stratification*: Low *2012 Appropriate Use Criteria for Coronary Revascularization Focused Update: J Am Coll Cardiol. 9390;30(0):923-300. http://content.airportbarriers.com.aspx?articleid=[email or web address] Electronically Signed   By: Van Clines M.D.   On: 05/17/2019 13:58   Dg Chest Port 1 View  Result Date: 05/29/2019 CLINICAL DATA:  Patient admitted to the hospital yesterday for shortness of breath. EXAM: PORTABLE CHEST 1 VIEW COMPARISON:  PA and lateral chest 05/15/2019. Single view of the chest and CT chest 05/28/2019 FINDINGS: Small bilateral pleural effusions, greater on the right, have increased since yesterday's exam. There is associated compressive basilar atelectasis. Heart size is normal. Aortic atherosclerosis. No pneumothorax. No acute or focal bony abnormality. IMPRESSION: Increased small bilateral pleural effusions and basilar atelectasis, worse on the right. Atherosclerosis. Electronically Signed   By: Inge Rise M.D.   On: 05/29/2019 10:12   Dg Chest Port 1 View  Result Date: 05/28/2019 CLINICAL DATA:  Chest pain and dyspnea EXAM: PORTABLE CHEST 1 VIEW COMPARISON:  05/15/2019 FINDINGS: Cardiac shadows within normal limits. Small right pleural effusion is noted. Mild bibasilar atelectasis is seen. No bony abnormality is noted. IMPRESSION: Right pleural effusion. Electronically Signed   By: Inez Catalina M.D.   On: 05/28/2019 07:46   US Thoracentesis Asp Pleural Space W/img Guide  Result Date: 05/29/2019 INDICATION: Symptomatic right sided pleural effusion EXAM: US THORACENTESIS ASP PLEURAL SPACE W/IMG GUIDE COMPARISON:  None. MEDICATIONS: 10 cc 1% lidocaine COMPLICATIONS: None immediate. TECHNIQUE: Informed written consent was obtained from the patient after a discussion of the risks, benefits and alternatives to treatment. A timeout was  performed prior to the initiation of the procedure. Initial ultrasound scanning demonstrates a right pleural effusion. The lower chest was prepped and draped in the usual sterile fashion. 1% lidocaine was used for local anesthesia. Under direct ultrasound guidance, a 19 gauge, 7-cm, Yueh catheter was introduced. An ultrasound image was saved for documentation purposes. The thoracentesis was performed. The catheter was removed and a dressing was applied. The patient tolerated the procedure well without immediate post procedural complication. The patient was escorted to have an upright chest radiograph. FINDINGS: A total of approximately 280 cc of thick bloody fluid was removed. Requested samples were sent to the laboratory. IMPRESSION: Successful ultrasound-guided right sided thoracentesis yielding 280 cc of pleural fluid. Read by Lavonia Drafts [facility] Electronically Signed   By: Markus Daft M.D.   On: 05/29/2019 10:24    Microbiology: Recent Results (from the past 240 hour(s))  SARS Coronavirus 2 [facility] order, Performed in [facility] hospital lab) Nasopharyngeal Nasopharyngeal Swab     Status: None   Collection Time: 05/28/19  7:50 AM   Specimen: Nasopharyngeal Swab  Result Value Ref Range Status   SARS Coronavirus 2 NEGATIVE NEGATIVE Final    Comment: (NOTE) If result is NEGATIVE SARS-CoV-2 target nucleic acids are NOT DETECTED. The SARS-CoV-2 RNA is generally detectable in upper and lower  respiratory specimens during the acute phase of infection. The lowest  concentration of SARS-CoV-2 viral copies this assay can detect is 250  copies / mL. A negative result does not preclude SARS-CoV-2 infection  and should not be used as the sole basis for treatment or other  patient management decisions.  A negative result may occur  with  improper specimen collection / handling, submission of specimen other  than nasopharyngeal swab, presence of viral mutation(s) within the  areas targeted by this assay,  and inadequate number of viral copies  (<250 copies / mL). A negative result must be combined with clinical  observations, patient history, and epidemiological information. If result is POSITIVE SARS-CoV-2 target nucleic acids are DETECTED. The SARS-CoV-2 RNA is generally detectable in upper and lower  respiratory specimens dur ing the acute phase of infection.  Positive  results are indicative of active infection with SARS-CoV-2.  Clinical  correlation with patient history and other diagnostic information is  necessary to determine patient infection status.  Positive results do  not rule out bacterial infection or co-infection with other viruses. If result is PRESUMPTIVE POSTIVE SARS-CoV-2 nucleic acids MAY BE PRESENT.   A presumptive positive result was obtained on the submitted specimen  and confirmed on repeat testing.  While 2019 novel coronavirus  (SARS-CoV-2) nucleic acids may be present in the submitted sample  additional confirmatory testing may be necessary for epidemiological  and / or clinical management purposes  to differentiate between  SARS-CoV-2 and other Sarbecovirus currently known to infect humans.  If clinically indicated additional testing with an alternate test  methodology [phone]) is advised. The SARS-CoV-2 RNA is generally  detectable in upper and lower respiratory sp ecimens during the acute  phase of infection. The expected result is Negative. Fact Sheet for Patients:  StrictlyIdeas.no Fact Sheet for Healthcare Providers: BankingDealers.co.za This test is not yet approved or cleared by the [place] FDA and has been authorized for detection and/or diagnosis of SARS-CoV-2 by FDA under an Emergency Use Authorization (EUA).  This EUA will remain in effect (meaning this test can be used) for the duration of the COVID-19 declaration under Section 564(b)(1) of the Act, 21 U.S.C. section 360bbb-3(b)(1), unless  the authorization is terminated or revoked sooner. Performed at [facility], [place] [address], [place], [place] [address]   Blood culture (routine x 2)     Status: Abnormal   Collection Time: 05/28/19 12:04 PM   Specimen: BLOOD  Result Value Ref Range Status   Specimen Description BLOOD RIGHT ANTECUBITAL  Final   Special Requests   Final    BOTTLES DRAWN AEROBIC AND ANAEROBIC Blood Culture adequate volume   Culture  Setup Time   Final    GRAM POSITIVE COCCI AEROBIC BOTTLE ONLY CRITICAL RESULT CALLED TO, READ BACK BY AND VERIFIED WITH: Pais[place] [id number] FCP    Culture (A)  Final    STAPHYLOCOCCUS SPECIES (COAGULASE NEGATIVE) THE SIGNIFICANCE OF ISOLATING THIS ORGANISM FROM A SINGLE SET OF BLOOD CULTURES WHEN MULTIPLE SETS ARE DRAWN IS UNCERTAIN. PLEASE NOTIFY THE MICROBIOLOGY DEPARTMENT WITHIN ONE WEEK IF SPECIATION AND SENSITIVITIES ARE REQUIRED. Performed at [facility], [place] [address], [place], [place] [address]    Report Status 05/31/2019 FINAL  Final  Blood culture (routine x 2)     Status: None (Preliminary result)   Collection Time: 05/28/19 12:04 PM   Specimen: BLOOD LEFT HAND  Result Value Ref Range Status   Specimen Description BLOOD LEFT HAND  Final   Special Requests   Final    BOTTLES DRAWN AEROBIC ONLY Blood Culture results may not be optimal due to an inadequate volume of blood received in culture bottles   Culture   Final    NO GROWTH 3 DAYS Performed at [facility], [place],  [place] [address]    Report Status PENDING  Incomplete  Blood Culture ID Panel (Reflexed)     Status: Abnormal   Collection Time: 05/28/19 12:04 PM  Result Value Ref Range Status   Enterococcus species NOT DETECTED NOT DETECTED Final   Listeria monocytogenes NOT DETECTED NOT DETECTED Final   Staphylococcus species DETECTED (A) NOT DETECTED Final    Comment: Methicillin (oxacillin) susceptible coagulase negative staphylococcus. Possible  blood culture contaminant (unless isolated from more than one blood culture draw or clinical case suggests pathogenicity). No antibiotic treatment is indicated for blood  culture contaminants. CRITICAL RESULT CALLED TO, READ BACK BY AND VERIFIED WITH: PHARMD ELIZABETH MARTIN [phone] [id number] FCP    Staphylococcus aureus (BCID) NOT DETECTED NOT DETECTED Final   Methicillin resistance NOT DETECTED NOT DETECTED Final   Streptococcus species NOT DETECTED NOT DETECTED Final   Streptococcus agalactiae NOT DETECTED NOT DETECTED Final   Streptococcus pneumoniae NOT DETECTED NOT DETECTED Final   Streptococcus pyogenes NOT DETECTED NOT DETECTED Final   Acinetobacter baumannii NOT DETECTED NOT DETECTED Final   Enterobacteriaceae species NOT DETECTED NOT DETECTED Final   Enterobacter cloacae complex NOT DETECTED NOT DETECTED Final   Escherichia coli NOT DETECTED NOT DETECTED Final   Klebsiella oxytoca NOT DETECTED NOT DETECTED Final   Klebsiella pneumoniae NOT DETECTED NOT DETECTED Final   Proteus species NOT DETECTED NOT DETECTED Final   Serratia marcescens NOT DETECTED NOT DETECTED Final   Haemophilus influenzae NOT DETECTED NOT DETECTED Final   Neisseria meningitidis NOT DETECTED NOT DETECTED Final   Pseudomonas aeruginosa NOT DETECTED NOT DETECTED Final   Candida albicans NOT DETECTED NOT DETECTED Final   Candida glabrata NOT DETECTED NOT DETECTED Final   Candida krusei NOT DETECTED NOT DETECTED Final   Candida parapsilosis NOT DETECTED NOT DETECTED Final   Candida tropicalis NOT DETECTED NOT DETECTED Final    Comment: Performed at [facility], [place]. [address], [place], [place] [address]  Culture, body fluid-bottle     Status: None (Preliminary result)   Collection Time: 05/29/19 10:31 AM   Specimen: Pleura  Result Value Ref Range Status   Specimen Description PLEURAL RIGHT  Final   Special Requests NONE  Final   Culture   Final    NO GROWTH 2 DAYS Performed at [facility] [address] [place], [place] [address]    Report Status PENDING  Incomplete  Gram stain     Status: None   Collection Time: 05/29/19 10:31 AM   Specimen: Pleura  Result Value Ref Range Status   Specimen Description PLEURAL RIGHT  Final   Special Requests NONE  Final   Gram Stain   Final    NO WBC SEEN NO ORGANISMS SEEN Performed at [facility], [address] [place], [place] [address]    Report Status 05/29/2019 FINAL  Final     Labs: Basic Metabolic Panel: Recent Labs  Lab 05/28/19 0833 05/28/19 1251 05/29/19 0340 05/30/19 0454 05/31/19 0318  NA 132*  --  134* 136 135  K 4.0  --  4.2 4.3 4.4  CL 95*  --  99 103 102  CO2 22  --  22 23 25   GLUCOSE 264*  --  193* 217* 242*  BUN 23  --  25* 23 19  CREATININE 1.18*  --  1.25* 1.07* 0.96  CALCIUM 8.9  --  7.9* 8.4* 8.8*  MG  --  2.1  --   --   --  PHOS  --  2.6  --   --   --    Liver Function Tests: Recent Labs  Lab 05/28/19 0833 05/29/19 0340  AST 18 15  ALT 23 17  ALKPHOS 347* 246*  BILITOT 1.4* 0.8  PROT 6.7 5.6*  ALBUMIN 2.6* 2.1*   No results for input(s): LIPASE, AMYLASE in the last 168 hours. No results for input(s): AMMONIA in the last 168 hours. CBC: Recent Labs  Lab 05/28/19 0833 05/28/19 1204 05/29/19 0340 05/30/19 0454 05/31/19 0318  WBC 14.5* 14.9* 16.3* 12.8* 13.7*  NEUTROABS  --  11.3*  --   --   --   HGB 10.6* 10.3* 8.5* 8.5* 9.4*  HCT 32.9* 31.6* 25.0* 26.4* 29.5*  MCV 80.2 78.8* 78.4* 79.8* 80.2  PLT 623* 593* 574* 630* 783*   Cardiac Enzymes: No results for input(s): CKTOTAL, CKMB, CKMBINDEX, TROPONINI in the last 168 hours. BNP: BNP (last 3 results) Recent Labs    05/28/19 1258  BNP 241.6*    ProBNP (last 3 results) No results for input(s): PROBNP in the last 8760 hours.  CBG: Recent Labs  Lab 05/30/19 1212 05/30/19 1652 05/30/19 2058 05/31/19 0218 05/31/19 0807  GLUCAP 129* 206* 198* 248* 284*       Signed:  Skyleigh Windle  [facility] 05/31/2019, 9:33 AM

## 2019-05-31 NOTE — Progress Notes (Signed)
0205 pt woke up drenched in sweat. Bed clothes and gown very wet. Changed all bed linens and gown. Checked pt's CBG as she has had hypoglycemic episodes in the past. CBG was 242. Pt had some discomfort at thoracentesis sight and she was medicated with 650 mg Tylenol.  She is now reporting right shoulder pain and requesting Tramadol . She has been taking Tramadol for a recent diagnosis of torn rotator cuff.

## 2019-05-31 NOTE — Progress Notes (Signed)
Discharged pt to home, instructions given and explained. Concerns addressed and belongings returned accordingly. 

## 2019-06-01 ENCOUNTER — Telehealth: Payer: Self-pay | Admitting: *Deleted

## 2019-06-01 ENCOUNTER — Other Ambulatory Visit: Payer: Self-pay | Admitting: *Deleted

## 2019-06-01 ENCOUNTER — Telehealth: Payer: Self-pay

## 2019-06-01 DIAGNOSIS — R911 Solitary pulmonary nodule: Secondary | ICD-10-CM

## 2019-06-01 DIAGNOSIS — J9859 Other diseases of mediastinum, not elsewhere classified: Secondary | ICD-10-CM

## 2019-06-01 LAB — TRIGLYCERIDES, BODY FLUIDS: Triglycerides, Fluid: 39 mg/dL

## 2019-06-01 NOTE — Telephone Encounter (Signed)
Pt is requesting a medication for to take while she is having her PET scan. Please advise. She has an appt with TP on 06/11/2019. She hasn't been seen at our office yet but was evaluated in the hospital.  Can we still write a Rx for her? Please advise.

## 2019-06-01 NOTE — Progress Notes (Signed)
pe

## 2019-06-01 NOTE — Telephone Encounter (Signed)
Called patient to see where she would like to have PET. Placed order for [facility]. Scheduled f/u with TP on 9/18 Nothing further needed.

## 2019-06-01 NOTE — Telephone Encounter (Signed)
-----   Message from Rigoberto Noel, MD sent at 05/30/2019 11:37 AM EDT ----- Please arrange PET scan for follow-up of anterior mediastinal mass and follow-up appointment with APP/me after  RA

## 2019-06-02 ENCOUNTER — Telehealth: Payer: Self-pay | Admitting: Hematology

## 2019-06-02 ENCOUNTER — Telehealth: Payer: Self-pay | Admitting: *Deleted

## 2019-06-02 LAB — ACID FAST SMEAR (AFB, MYCOBACTERIA): Acid Fast Smear: NEGATIVE

## 2019-06-02 LAB — CULTURE, BLOOD (ROUTINE X 2): Culture: NO GROWTH

## 2019-06-02 NOTE — Telephone Encounter (Signed)
Called the patient and advised her of the response received. The patient stated she already has a prescription for Klonopin (Clonazepam) and only takes it as she needs it for anxiety. Patient stated she told staff at the hospital she was already taking it as needed and was issued by her PCP.  Patient said she only wanted to know if she could take it prior to the scan. I told the patient to call her PCP to make her aware of the test and ask if appropriate to take the full 1 mg tablet instead the 1/2 she may normally take. Patient stated her husband would be driving her and with her for the test.  Patient voiced understanding. Nothing further needed at this time.

## 2019-06-02 NOTE — Telephone Encounter (Signed)
I cld Ms. Clutter and she was unable to attend appt on 9/22. She has been rescheduled to see Dr. Irene Limbo on 9/24 at 11am. She's aware to arrive 15 minutes early.

## 2019-06-02 NOTE — Telephone Encounter (Signed)
I apologize I have not seen her and can not prescribe a controlled substance .  She has Klonopin on her MAR , please discuss with PCP regarding this  Or can send to Dr. Elsworth Soho  And see if he is okay with this as he saw her in the hospital   Please contact office for sooner follow up if symptoms do not improve or worsen or seek emergency care

## 2019-06-02 NOTE — Telephone Encounter (Signed)
Patient called - left voice mail: was told on recent discharge from hospital that the hospital doctor had contacted Dr. Irene Limbo and she needs to schedule an appt. Per Dr. Irene Limbo, Dr. Ree Kida requested for him to see patient following discharge. Dr. Irene Limbo requested she have an appt scheduled after she has PET/CT scheduled on 9/15.  Contacted patient with this information and informed patient she would be contacted by new patient scheduler. Patient verbalized understanding.

## 2019-06-02 NOTE — Telephone Encounter (Signed)
Received a staff msg to schedule a hosp fu w/Dr. Irene Limbo on 9/22 at 11am. I will call and mail the pt a letter.

## 2019-06-03 ENCOUNTER — Other Ambulatory Visit: Payer: Self-pay

## 2019-06-03 ENCOUNTER — Other Ambulatory Visit: Payer: Self-pay | Admitting: Orthopedic Surgery

## 2019-06-03 ENCOUNTER — Ambulatory Visit
Admission: RE | Admit: 2019-06-03 | Discharge: 2019-06-03 | Disposition: A | Discharge status: Home / Self Care | Payer: Medicare Other | Source: Ambulatory Visit | Attending: Internal Medicine | Admitting: Internal Medicine

## 2019-06-03 DIAGNOSIS — M25511 Pain in right shoulder: Secondary | ICD-10-CM

## 2019-06-03 DIAGNOSIS — Z1231 Encounter for screening mammogram for malignant neoplasm of breast: Secondary | ICD-10-CM

## 2019-06-03 LAB — CULTURE, BODY FLUID W GRAM STAIN -BOTTLE: Culture: NO GROWTH

## 2019-06-04 LAB — CHOLESTEROL, BODY FLUID: Cholesterol, Fluid: 61 mg/dL

## 2019-06-07 ENCOUNTER — Encounter (INDEPENDENT_AMBULATORY_CARE_PROVIDER_SITE_OTHER): Payer: Medicare Other | Admitting: Ophthalmology

## 2019-06-07 ENCOUNTER — Other Ambulatory Visit: Payer: Self-pay

## 2019-06-07 ENCOUNTER — Telehealth: Payer: Self-pay | Admitting: *Deleted

## 2019-06-07 DIAGNOSIS — H43813 Vitreous degeneration, bilateral: Secondary | ICD-10-CM

## 2019-06-07 DIAGNOSIS — E113313 Type 2 diabetes mellitus with moderate nonproliferative diabetic retinopathy with macular edema, bilateral: Secondary | ICD-10-CM | POA: Diagnosis not present

## 2019-06-07 DIAGNOSIS — E11311 Type 2 diabetes mellitus with unspecified diabetic retinopathy with macular edema: Secondary | ICD-10-CM | POA: Diagnosis not present

## 2019-06-07 NOTE — Telephone Encounter (Signed)
Patient called for information about PET scan scheduled for tomorrow at 1330 - was told no food or carbs, water only after 8AM. She said she was confused about directions because she is diabetic. Contacted [place] w/[facility] PET scan. She reviewed patient directions and stated that patient should eat prior to 8AM, recommending protein foods, ex: eggs/meat, and avoid carb heavy foods such as cereal, toast or juice. Patient can bring snack to eat as soon as PET completed. Contacted patient with instructions per [place]. Patient verbalized understanding of instructions. She states she will check her blood sugar prior to coming to appt and share this with radiology.

## 2019-06-08 ENCOUNTER — Encounter (HOSPITAL_COMMUNITY)
Admission: RE | Admit: 2019-06-08 | Discharge: 2019-06-08 | Disposition: A | Discharge status: Home / Self Care | Payer: Medicare Other | Source: Ambulatory Visit | Attending: Pulmonary Disease | Admitting: Pulmonary Disease

## 2019-06-08 ENCOUNTER — Other Ambulatory Visit: Payer: Self-pay

## 2019-06-08 ENCOUNTER — Encounter (HOSPITAL_COMMUNITY): Payer: Self-pay

## 2019-06-08 DIAGNOSIS — J90 Pleural effusion, not elsewhere classified: Secondary | ICD-10-CM | POA: Diagnosis not present

## 2019-06-08 DIAGNOSIS — Z7901 Long term (current) use of anticoagulants: Secondary | ICD-10-CM | POA: Insufficient documentation

## 2019-06-08 DIAGNOSIS — I4891 Unspecified atrial fibrillation: Secondary | ICD-10-CM | POA: Diagnosis not present

## 2019-06-08 DIAGNOSIS — Z79899 Other long term (current) drug therapy: Secondary | ICD-10-CM | POA: Insufficient documentation

## 2019-06-08 DIAGNOSIS — Z794 Long term (current) use of insulin: Secondary | ICD-10-CM | POA: Insufficient documentation

## 2019-06-08 DIAGNOSIS — R590 Localized enlarged lymph nodes: Secondary | ICD-10-CM | POA: Insufficient documentation

## 2019-06-08 DIAGNOSIS — E119 Type 2 diabetes mellitus without complications: Secondary | ICD-10-CM | POA: Insufficient documentation

## 2019-06-08 DIAGNOSIS — E785 Hyperlipidemia, unspecified: Secondary | ICD-10-CM | POA: Insufficient documentation

## 2019-06-08 DIAGNOSIS — I10 Essential (primary) hypertension: Secondary | ICD-10-CM | POA: Diagnosis not present

## 2019-06-08 DIAGNOSIS — J9859 Other diseases of mediastinum, not elsewhere classified: Secondary | ICD-10-CM | POA: Insufficient documentation

## 2019-06-08 LAB — GLUCOSE, CAPILLARY
Glucose-Capillary: 304 mg/dL — ABNORMAL HIGH (ref 70–99)
Glucose-Capillary: 316 mg/dL — ABNORMAL HIGH (ref 70–99)

## 2019-06-09 ENCOUNTER — Encounter (HOSPITAL_COMMUNITY)
Admission: RE | Admit: 2019-06-09 | Discharge: 2019-06-09 | Disposition: A | Discharge status: Home / Self Care | Payer: Medicare Other | Source: Ambulatory Visit | Attending: Pulmonary Disease | Admitting: Pulmonary Disease

## 2019-06-09 DIAGNOSIS — J9859 Other diseases of mediastinum, not elsewhere classified: Secondary | ICD-10-CM | POA: Insufficient documentation

## 2019-06-09 DIAGNOSIS — J90 Pleural effusion, not elsewhere classified: Secondary | ICD-10-CM | POA: Diagnosis not present

## 2019-06-09 DIAGNOSIS — E785 Hyperlipidemia, unspecified: Secondary | ICD-10-CM | POA: Diagnosis not present

## 2019-06-09 DIAGNOSIS — E119 Type 2 diabetes mellitus without complications: Secondary | ICD-10-CM | POA: Insufficient documentation

## 2019-06-09 DIAGNOSIS — I4891 Unspecified atrial fibrillation: Secondary | ICD-10-CM | POA: Diagnosis not present

## 2019-06-09 DIAGNOSIS — Z7901 Long term (current) use of anticoagulants: Secondary | ICD-10-CM | POA: Diagnosis not present

## 2019-06-09 DIAGNOSIS — I700 Atherosclerosis of aorta: Secondary | ICD-10-CM | POA: Diagnosis not present

## 2019-06-09 DIAGNOSIS — I10 Essential (primary) hypertension: Secondary | ICD-10-CM | POA: Insufficient documentation

## 2019-06-09 DIAGNOSIS — R599 Enlarged lymph nodes, unspecified: Secondary | ICD-10-CM | POA: Diagnosis not present

## 2019-06-09 LAB — GLUCOSE, CAPILLARY: Glucose-Capillary: 233 mg/dL — ABNORMAL HIGH (ref 70–99)

## 2019-06-09 MED ORDER — FLUDEOXYGLUCOSE F - 18 (FDG) INJECTION
6.7600 | Freq: Once | INTRAVENOUS | Status: AC | PRN
  Administered 2019-06-09: 6.76 via INTRAVENOUS

## 2019-06-11 ENCOUNTER — Other Ambulatory Visit: Payer: Self-pay

## 2019-06-11 ENCOUNTER — Encounter: Payer: Self-pay | Admitting: Adult Health

## 2019-06-11 ENCOUNTER — Ambulatory Visit (INDEPENDENT_AMBULATORY_CARE_PROVIDER_SITE_OTHER): Payer: Medicare Other | Admitting: Adult Health

## 2019-06-11 DIAGNOSIS — J90 Pleural effusion, not elsewhere classified: Secondary | ICD-10-CM

## 2019-06-11 DIAGNOSIS — J9859 Other diseases of mediastinum, not elsewhere classified: Secondary | ICD-10-CM | POA: Diagnosis not present

## 2019-06-11 NOTE — Assessment & Plan Note (Signed)
Bilateral pleural effusion status post right thoracentesis.  Cytology was negative for malignant cells.  Cultures were negative.  PET scan shows only residual small left pleural effusion. Follow-up chest x-ray in 2 months

## 2019-06-11 NOTE — Assessment & Plan Note (Signed)
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  Patient also had recently had teeth extraction with possible underlying infection.  She was treated with IV antibiotics.  Appears to be clinically improved.  She is status post thoracentesis with cultures negative.  Cytology was negative for malignant cells. PET scan is reassuring with no evidence of hypermetabolic mass.  There is a mildly enlarged left prevascular lymph node.  And only a small dependent left pleural effusion remains.  This is much improved. She does have some lingering cough and congestion.  We will have her follow-up in 2 months with a follow-up chest x-ray  Plan  Patient Instructions  Mucinex Twice daily  As needed  Cough/congesteion  Delsym 2 tsp Twice daily  As needed  Cough  Continue on Incentive Spirometry As needed   Follow up with Dr. Elsworth Soho  Or Jasneet Schobert NP in 2 months and As needed  With chest xray .  Please contact office for sooner follow up if symptoms do not improve or worsen or seek emergency care

## 2019-06-11 NOTE — Patient Instructions (Signed)
Mucinex Twice daily  As needed  Cough/congesteion  Delsym 2 tsp Twice daily  As needed  Cough  Continue on Incentive Spirometry As needed   Follow up with Dr. Elsworth Soho  Or [place] NP in 2 months and As needed  With chest xray .  Please contact office for sooner follow up if symptoms do not improve or worsen or seek emergency care

## 2019-06-11 NOTE — Progress Notes (Signed)
@Patient  ID: [name], female    DOB: 04-30-1956, 63 y.o.   MRN: [id number]  Chief Complaint  Patient presents with   Follow-up    Mediastinal mass    Referring provider: Leeroy Cha,*  HPI: Patient is a 63 year old female seen for pulmonary consult during hospitalization May 28, 2019 for mediastinal mass Medical hx significant for DM  Hx of cervical cancer at age 37 (Hysterectomy )   TEST/EVENTS :  CT chest 05/28/2019 showed ill-defined soft tissue thickening in the anterior mediastinum extending into the left supraclavicular nodal station.  Concern for lymphoproliferative process or thymic origin neoplasm.  Bilateral pleural effusions right greater than left   06/11/2019 Follow up : Mediastinal Mass  Patient presents for a post hospital follow-up.  Patient was seen earlier this month for a pulmonary consult during hospitalization.  Patient had presented to the emergency room with chest pain and shortness of breath.  She had also been admitted prior to this recently with syncope and hypoglycemia and new onset A. fib.  She was discharged on Eliquis.  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   She has known chronic lymphocytosis and inguinal lymphadenopathy biopsy done September 2019 came back benign.  Prior to second admission she had several teeth removed. CT chest was negative for PE, showed a ill-defined soft tissue thickening in the anterior mediastinal misting into the left supraclavicular nodal station concerning for lymphoproliferative process or thymic origin neoplasm.  Patient was treated with antibiotics.  She also had bilateral pleural effusions right greater than left.  Underwent a right-sided thoracentesis with 280 cc of fluid removed appeared to be transudate of.  Fluid culture growth was negative.  Cytology was negative.  2D echo August 2020 showed a preserved EF.   PET scan showed no enlarged or hypermetabolic axillary mediastinal or hilar lymph nodes.  Mildly enlarged 1.2 cm left prevascular lymph node is stable since September 2020.  Otherwise no discrete mass or hyper metabolism in the anterior mediastinum or supraclavicular regions.  Small dependent left pleural effusion noted.  Abdomen with no abnormal hypermetabolic activity within.  Nonspecific focus of mild hypermetabolism along the posterior margin of the ascending colon. Discussed these results with patient and husband .  Since discharge patient says she is feeling better but continues to have significant fatigue low energy.  She does have some lingering cough and congestion.  Allergies  Allergen Reactions   Gabapentin Other (See Comments)    Caused upset stomach   Penicillins Hives and Other (See Comments)    Has patient had a PCN reaction causing immediate rash, facial/tongue/throat swelling, SOB or lightheadedness with hypotension: Unknown Has patient had a PCN reaction causing severe rash involving mucus membranes or skin necrosis: Unknown Has patient had a PCN reaction that required hospitalization: Unknown Has patient had a PCN reaction occurring within the last 10 years: No If all of the above answers are "NO", then may proceed with Cephalosporin use.    Pepcid [Famotidine] Other (See Comments)    HEADACHE    Immunization History  Administered Date(s) Administered   Influenza Split 06/20/2014, 07/11/2015, 06/23/2018   Influenza Whole 06/17/2013   Influenza, High Dose Seasonal PF 05/11/2019   PPD Test 01/18/2015, 04/30/2016   Pneumococcal Conjugate-13 05/19/2015, 05/11/2019   Pneumococcal-Unspecified 09/23/2009   Tdap 09/23/2006    Past Medical History:  Diagnosis Date   Cancer ([place]) 1977   CERVICAL  GERD (gastroesophageal reflux disease) 11/14/2013   Gout    Mixed hyperlipidemia 11/14/2013    Tobacco History: Social History   Tobacco Use  Smoking Status  Former Smoker   Quit date: 09/23/1989   Years since quitting: 29.7  Smokeless Tobacco Never Used   Counseling given: Not Answered   Outpatient Medications Prior to Visit  Medication Sig Dispense Refill   amLODipine (NORVASC) 5 MG tablet Take 5 mg by mouth daily.     apixaban (ELIQUIS) 5 MG TABS tablet Take 1 tablet (5 mg total) by mouth 2 (two) times daily. 60 tablet 3   atorvastatin (LIPITOR) 10 MG tablet Take 10 mg by mouth daily at 6 PM.      Blood Glucose Monitoring Suppl (ONETOUCH VERIO) w/Device KIT 1 kit by Does not apply route 3 (three) times daily. 1 kit 0   Cholecalciferol (VITAMIN D-3) 5000 units TABS Take 5,000 tablets by mouth every evening.     clonazePAM (KLONOPIN) 1 MG tablet Take 0.5-1 mg by mouth daily as needed for anxiety.   1   dextromethorphan-guaiFENesin (MUCINEX DM) 30-600 MG 12hr tablet Take 1 tablet by mouth 2 (two) times daily as needed for cough.     glucose blood (ONETOUCH VERIO) test strip CHECK BLOOD SUGAR 3 TIMES DAILY-DX-E11.9 AND Z79.4. 300 each 1   insulin NPH-regular Human (70-30) 100 UNIT/ML injection Inject 5-6 Units into the skin See admin instructions. Take 6 units in the morning, and 5 units at night. Hold insulin if blood sugar is less than 99 mg.  Decrease to 3 units if blood sugar is 100 to 140 mg. 10 mL 11   losartan (COZAAR) 25 MG tablet Take 1 tablet (25 mg total) by mouth daily. 30 tablet 3   metoprolol succinate (TOPROL-XL) 25 MG 24 hr tablet Take 1 tablet (25 mg total) by mouth daily. 30 tablet 2   omeprazole (PRILOSEC) 20 MG capsule Take 20 mg by mouth daily.     ONETOUCH DELICA LANCETS 84Y MISC Check blood sugar 3 times daily-DX-E11.9 and Z79.4. 300 each 1   pregabalin (LYRICA) 75 MG capsule TAKE 1 CAPSULE BY MOUTH TWICE A DAY (Patient taking differently: Take 75 mg by mouth at bedtime. ) 60 capsule 5   Propylene Glycol (SYSTANE COMPLETE) 0.6 % SOLN Place 1 drop into both eyes 2 (two) times daily as needed (for dry eyes).       traMADol (ULTRAM) 50 MG tablet Take 50-100 mg by mouth every 6 (six) hours as needed.     fexofenadine (ALLEGRA) 180 MG tablet Take 180 mg by mouth daily as needed for allergies.      No facility-administered medications prior to visit.      Review of Systems:   Constitutional:   No  weight loss, night sweats,  Fevers, chills,  +fatigue, or  lassitude.  HEENT:   No headaches,  Difficulty swallowing,  Tooth/dental problems, or  Sore throat,                No sneezing, itching, ear ache,  +nasal congestion, post nasal drip,   CV:  No chest pain,  Orthopnea, PND, swelling in lower extremities, anasarca, dizziness, palpitations, syncope.   GI  No heartburn, indigestion, abdominal pain, nausea, vomiting, diarrhea, change in bowel habits, loss of appetite, bloody stools.   Resp: .  No chest wall deformity  Skin: no rash or lesions.  GU: no dysuria, change in color of urine, no urgency or frequency.  No flank pain,  no hematuria   MS:  No joint pain or swelling.  No decreased range of motion.  No back pain.    Physical Exam  BP (!) 142/76 (BP Location: Left Arm, Cuff Size: Normal)    Pulse 75    Temp 98.2 F (36.8 C) (Temporal)    Ht 5' (1.524 m)    Wt 128 lb (58.1 kg)    SpO2 95%    BMI 25.00 kg/m   GEN: A/Ox3; pleasant , NAD elderly   HEENT:  [place]/AT,  , NOSE-clear, THROAT-clear, no lesions, no postnasal drip or exudate noted.   NECK:  Supple w/ fair ROM; no JVD; normal carotid impulses w/o bruits; no thyromegaly or nodules palpated; no lymphadenopathy.    RESP  Clear  P & A; w/o, wheezes/ rales/ or rhonchi. no accessory muscle use, no dullness to percussion  CARD:  RRR, no m/r/g, tr peripheral edema, pulses intact, no cyanosis or clubbing.  GI:   Soft & nt; nml bowel sounds; no organomegaly or masses detected.   Musco: Warm bil, no deformities or joint swelling noted.   Neuro: alert, no focal deficits noted.    Skin: Warm, no lesions or rashes    Lab  Results:  CBC    Component Value Date/Time   WBC 13.7 (H) 05/31/2019 0318   RBC 3.68 (L) 05/31/2019 0318   HGB 9.4 (L) 05/31/2019 0318   HCT 29.5 (L) 05/31/2019 0318   PLT 783 (H) 05/31/2019 0318   MCV 80.2 05/31/2019 0318   MCH 25.5 (L) 05/31/2019 0318   MCHC 31.9 05/31/2019 0318   RDW 15.1 05/31/2019 0318   LYMPHSABS 1.7 05/28/2019 1204   MONOABS 1.8 (H) 05/28/2019 1204   EOSABS 0.0 05/28/2019 1204   BASOSABS 0.1 05/28/2019 1204    BMET    Component Value Date/Time   NA 135 05/31/2019 0318   K 4.4 05/31/2019 0318   CL 102 05/31/2019 0318   CO2 25 05/31/2019 0318   GLUCOSE 242 (H) 05/31/2019 0318   BUN 19 05/31/2019 0318   CREATININE 0.96 05/31/2019 0318   CREATININE 0.88 06/23/2018 1337   CREATININE 0.59 12/24/2016 1212   CALCIUM 8.8 (L) 05/31/2019 0318   GFRNONAA >60 05/31/2019 0318   GFRNONAA >60 06/23/2018 1337   GFRNONAA >89 12/24/2016 1212   GFRAA >60 05/31/2019 0318   GFRAA >60 06/23/2018 1337   GFRAA >89 12/24/2016 1212    BNP    Component Value Date/Time   BNP 241.6 (H) 05/28/2019 1258    ProBNP No results found for: PROBNP  Imaging: Dg Chest 1 View  Result Date: 05/29/2019 CLINICAL DATA:  Shortness of breath.  Pleural effusion. EXAM: CHEST  1 VIEW COMPARISON:  Single-view of the chest 05/29/2019. CT chest 05/28/2019. FINDINGS: Small bilateral pleural effusions and basilar airspace disease are worse on the right and unchanged. No pneumothorax. Heart size is normal. IMPRESSION: No change in small bilateral pleural effusions and airspace disease, worse on the right. Electronically Signed   By: Inge Rise M.D.   On: 05/29/2019 10:41   X-ray Chest Pa And Lateral  Result Date: 05/15/2019 CLINICAL DATA:  Syncope, collapse EXAM: CHEST - 2 VIEW COMPARISON:  02/03/2013 FINDINGS: The heart size and mediastinal contours are within normal limits. Both lungs are clear. The visualized skeletal structures are unremarkable. IMPRESSION: No acute abnormality of  the lungs. Electronically Signed   By: Eddie Candle M.D.   On: 05/15/2019 13:38   Ct Head Wo Contrast  Result Date: 05/15/2019  CLINICAL DATA:  Altered mental status EXAM: CT HEAD WITHOUT CONTRAST TECHNIQUE: Contiguous axial images were obtained from the base of the skull through the vertex without intravenous contrast. COMPARISON:  05/31/2010 FINDINGS: Brain: No evidence of acute infarction, hemorrhage, hydrocephalus, extra-axial collection or mass lesion/mass effect. Vascular: No hyperdense vessel or unexpected calcification. Skull: Normal. Negative for fracture or focal lesion. Sinuses/Orbits: No acute finding. Other: None. IMPRESSION: No acute intracranial pathology. Electronically Signed   By: Eddie Candle M.D.   On: 05/15/2019 14:12   Ct Angio Chest Pe W And/or Wo Contrast  Result Date: 05/28/2019 CLINICAL DATA:  Short of breath, nausea. EXAM: CT ANGIOGRAPHY CHEST WITH CONTRAST TECHNIQUE: Multidetector CT imaging of the chest was performed using the standard protocol during bolus administration of intravenous contrast. Multiplanar CT image reconstructions and MIPs were obtained to evaluate the vascular anatomy. CONTRAST:  192m OMNIPAQUE IOHEXOL 350 MG/ML SOLN COMPARISON:  Neck CT 04/02/2018 FINDINGS: Cardiovascular: No filling defects within the pulmonary arteries to suggest acute pulmonary embolism. No acute findings of the aorta or great vessels. No pericardial fluid. Mediastinum/Nodes: No axillary lymphadenopathy. There is ill-defined soft tissue thickening in the anterior mediastinum measuring 2.5 cm in thickness (image 39/5)a. This ill-defined tissue extends into the LEFT supraclavicular space (image 22/5. The tissue planes about the esophagus or poorly defined particularly inferior to the carina. No pericardial effusion Lungs/Pleura: There is a moderate effusion on the RIGHT and small effusion on the LEFT. There is interlobular septal thickening in lower lobes. Motion degradation of the imaging.  No clear nodularity. Upper Abdomen: Limited view of the liver, kidneys, pancreas are unremarkable. Normal adrenal glands. Musculoskeletal: No aggressive osseous lesion. Review of the MIP images confirms the above findings. IMPRESSION: 1. Ill-defined soft tissue thickening in the anterior mediastinum extending into the LEFT supraclavicular clavicular nodal station. Concern for lymphoproliferative process or thymic origin neoplasm. Findings are new from comparison neck CT 2019. Consider FDG PET scan to evaluate malignant potential. LEFT supraclavicular tissue may be amenable to biopsy. 2. Bilateral pleural effusions RIGHT greater than LEFT. RIGHT effusion is moderate. 3. Mild interstitial edema. 4. No evidence of acute pulmonary embolism. Electronically Signed   By: SSuzy BouchardM.D.   On: 05/28/2019 10:57   UKoreaAbdomen Complete  Result Date: 05/28/2019 CLINICAL DATA:  Elevated LFTs EXAM: ABDOMEN ULTRASOUND COMPLETE COMPARISON:  04/02/2018 FINDINGS: Gallbladder: Surgically removed Common bile duct: Diameter: 7.5 mm. Liver: No focal lesion identified. Within normal limits in parenchymal echogenicity. Portal vein is patent on color Doppler imaging with normal direction of blood flow towards the liver. IVC: No abnormality visualized. Pancreas: Visualized portion unremarkable. Spleen: Surgically removed Right Kidney: Length: 11.6 cm. Echogenicity within normal limits. No mass or hydronephrosis visualized. Left Kidney: Length: 11.2 cm. Echogenicity within normal limits. No mass or hydronephrosis visualized. Abdominal aorta: No aneurysm visualized. Other findings: Note is made of a right-sided pleural effusion. IMPRESSION: Postsurgical changes. No acute abnormality noted. Electronically Signed   By: MInez CatalinaM.D.   On: 05/28/2019 14:01   Nm Myocar Multi W/spect W/wall Motion / Ef  Result Date: 05/17/2019 CLINICAL DATA:  Hypertension. Diabetes. Hyperlipidemia. Acute coronary syndrome. EXAM: MYOCARDIAL IMAGING  WITH SPECT (REST AND PHARMACOLOGIC-STRESS) GATED LEFT VENTRICULAR WALL MOTION STUDY LEFT VENTRICULAR EJECTION FRACTION TECHNIQUE: Standard myocardial SPECT imaging was performed after resting intravenous injection of 10 mCi Tc-925metrofosmin. Subsequently, intravenous infusion of Lexiscan was performed under the supervision of the Cardiology staff. At peak effect of the drug, 30 mCi Tc-9988mtrofosmin was injected intravenously  and standard myocardial SPECT imaging was performed. Quantitative gated imaging was also performed to evaluate left ventricular wall motion, and estimate left ventricular ejection fraction. COMPARISON:  None. FINDINGS: Perfusion: Faintly reduced anterior wall activity on stress and rest images could potentially be from mild scarring or breast attenuation. No inducible ischemia. Wall Motion: Normal left ventricular wall motion. No left ventricular dilation. Left Ventricular Ejection Fraction: 68 % End diastolic volume 78 ml End systolic volume 25 ml IMPRESSION: 1. No inducible ischemia identified. Faintly reduced matched anterior wall activity on stress and rest images could be from breast attenuation or mild scarring. 2. Normal left ventricular wall motion. 3. Left ventricular ejection fraction 68% 4. Non invasive risk stratification*: Low *2012 Appropriate Use Criteria for Coronary Revascularization Focused Update: J Am Coll Cardiol. 8676;19(5):093-267. http://[email or web address]?[email or web address] Electronically Signed   By: Van Clines M.D.   On: 05/17/2019 13:58   Nm Pet Image Initial (pi) Skull Base To Thigh  Result Date: 06/09/2019 CLINICAL DATA:  Initial treatment strategy for anterior mediastinal soft tissue on recent chest CT angiogram. Remote history of cervical cancer status post hysterectomy. 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 EXAM: NUCLEAR MEDICINE PET SKULL BASE TO THIGH TECHNIQUE: 6.8 mCi F-18 FDG was injected intravenously.  Full-ring PET imaging was performed from the skull base to thigh after the radiotracer. CT data was obtained and used for attenuation correction and anatomic localization. Fasting blood glucose: 233 mg/dl COMPARISON:  05/28/2019 chest CT angiogram. 11/02/2018 CT abdomen/pelvis. FINDINGS: Mediastinal blood pool activity: SUV max 2.6 Liver activity: SUV max NA NECK: No hypermetabolic lymph nodes in the neck. Incidental CT findings: Subcentimeter left thyroid lobe calcification. CHEST: No enlarged or hypermetabolic axillary, mediastinal or hilar lymph nodes. Mildly enlarged 1.2 cm left prevascular lymph node is stable since 05/28/2019 chest CT angiogram study and is non hypermetabolic. Otherwise no discrete mass or hypermetabolism in anterior mediastinum or supraclavicular regions. No hypermetabolic pulmonary findings. Incidental CT findings: Small dependent left pleural effusion. No acute consolidative airspace disease, lung masses or significant pulmonary nodules. ABDOMEN/PELVIS: No abnormal hypermetabolic activity within the liver, pancreas, adrenal glands, or spleen. No hypermetabolic lymph nodes in the abdomen or pelvis. Nonspecific focus of mild hypermetabolism along the posterior margin of the ascending colon without CT correlate. Incidental CT findings: Cholecystectomy. Atherosclerotic nonaneurysmal abdominal aorta. Status post splenectomy with a few splenules in the splenectomy bed. Status post resection of the pancreatic body and tail. Hysterectomy. Moderate colonic stool volume. SKELETON: No focal hypermetabolic activity to suggest skeletal metastasis. Incidental CT findings: none IMPRESSION: 1. No evidence of a hypermetabolic mass in the anterior mediastinum to suggest a neoplastic process. Mildly enlarged left prevascular lymph node is stable since 05/28/2019 chest CT angiogram study and is non hypermetabolic, compatible with reactive node. 2. Nonspecific focus of mild hypermetabolism along the posterior  margin of the ascending colon, without CT correlate, potentially physiologic. Correlation with the patient's colon cancer screening history is recommended. If screening is not up-to-date, appropriate screening should be considered. 3. Small dependent left pleural effusion. 4.  Aortic Atherosclerosis (ICD10-I70.0). Electronically Signed   By: Ilona Sorrel M.D.   On: 06/09/2019 12:28   Dg Chest Port 1 View  Result Date: 05/29/2019 CLINICAL DATA:  Patient admitted to the hospital yesterday for shortness of breath. EXAM: PORTABLE CHEST 1 VIEW COMPARISON:  PA and lateral chest 05/15/2019. Single view of the chest and CT chest 05/28/2019 FINDINGS: Small bilateral pleural effusions, greater on the right, have increased since yesterday's exam. There is associated compressive  basilar atelectasis. Heart size is normal. Aortic atherosclerosis. No pneumothorax. No acute or focal bony abnormality. IMPRESSION: Increased small bilateral pleural effusions and basilar atelectasis, worse on the right. Atherosclerosis. Electronically Signed   By: Inge Rise M.D.   On: 05/29/2019 10:12   Dg Chest Port 1 View  Result Date: 05/28/2019 CLINICAL DATA:  Chest pain and dyspnea EXAM: PORTABLE CHEST 1 VIEW COMPARISON:  05/15/2019 FINDINGS: Cardiac shadows within normal limits. Small right pleural effusion is noted. Mild bibasilar atelectasis is seen. No bony abnormality is noted. IMPRESSION: Right pleural effusion. Electronically Signed   By: Inez Catalina M.D.   On: 05/28/2019 07:46   Mm 3d Screen Breast Bilateral  Result Date: 06/04/2019 CLINICAL DATA:  Screening. EXAM: DIGITAL SCREENING BILATERAL MAMMOGRAM WITH TOMO AND CAD COMPARISON:  Previous exam(s). ACR Breast Density Category b: There are scattered areas of fibroglandular density. FINDINGS: There are no findings suspicious for malignancy. Images were processed with CAD. IMPRESSION: No mammographic evidence of malignancy. A result letter of this screening mammogram will  be mailed directly to the patient. RECOMMENDATION: Screening mammogram in one year. (Code:SM-B-01Y) BI-RADS CATEGORY  1: Negative. Electronically Signed   By: Audie Pinto M.D.   On: 06/04/2019 12:13   US Thoracentesis Asp Pleural Space W/img Guide  Result Date: 05/29/2019 INDICATION: Symptomatic right sided pleural effusion EXAM: US THORACENTESIS ASP PLEURAL SPACE W/IMG GUIDE COMPARISON:  None. MEDICATIONS: 10 cc 1% lidocaine COMPLICATIONS: None immediate. TECHNIQUE: Informed written consent was obtained from the patient after a discussion of the risks, benefits and alternatives to treatment. A timeout was performed prior to the initiation of the procedure. Initial ultrasound scanning demonstrates a right pleural effusion. The lower chest was prepped and draped in the usual sterile fashion. 1% lidocaine was used for local anesthesia. Under direct ultrasound guidance, a 19 gauge, 7-cm, Yueh catheter was introduced. An ultrasound image was saved for documentation purposes. The thoracentesis was performed. The catheter was removed and a dressing was applied. The patient tolerated the procedure well without immediate post procedural complication. The patient was escorted to have an upright chest radiograph. FINDINGS: A total of approximately 280 cc of thick bloody fluid was removed. Requested samples were sent to the laboratory. IMPRESSION: Successful ultrasound-guided right sided thoracentesis yielding 280 cc of pleural fluid. Read by Lavonia Drafts [facility] Electronically Signed   By: Markus Daft M.D.   On: 05/29/2019 10:24      No flowsheet data found.  No results found for: NITRICOXIDE      Assessment & Plan:   Mediastinal mass Mediastinal mass noted on CT scan with associated bilateral effusions.  Patient also had recently had teeth extraction with possible underlying infection.  She was treated with IV antibiotics.  Appears to be clinically improved.  She is status post thoracentesis with  cultures negative.  Cytology was negative for malignant cells. PET scan is reassuring with no evidence of hypermetabolic mass.  There is a mildly enlarged left prevascular lymph node.  And only a small dependent left pleural effusion remains.  This is much improved. She does have some lingering cough and congestion.  We will have her follow-up in 2 months with a follow-up chest x-ray  Plan  Patient Instructions  Mucinex Twice daily  As needed  Cough/congesteion  Delsym 2 tsp Twice daily  As needed  Cough  Continue on Incentive Spirometry As needed   Follow up with Dr. Elsworth Soho  Or Kahlyn Shippey NP in 2 months and As needed  With chest  xray .  Please contact office for sooner follow up if symptoms do not improve or worsen or seek emergency care       Pleural effusion Bilateral pleural effusion status post right thoracentesis.  Cytology was negative for malignant cells.  Cultures were negative.  PET scan shows only residual small left pleural effusion. Follow-up chest x-ray in 2 months     Kaden Daughdrill, NP 06/11/2019

## 2019-06-15 ENCOUNTER — Inpatient Hospital Stay: Payer: PRIVATE HEALTH INSURANCE | Admitting: Hematology

## 2019-06-15 ENCOUNTER — Other Ambulatory Visit: Payer: PRIVATE HEALTH INSURANCE

## 2019-06-17 ENCOUNTER — Telehealth: Payer: Self-pay | Admitting: *Deleted

## 2019-06-17 ENCOUNTER — Inpatient Hospital Stay: Payer: Medicare Other | Admitting: Hematology

## 2019-06-17 ENCOUNTER — Telehealth: Payer: Self-pay | Admitting: Hematology

## 2019-06-17 NOTE — Telephone Encounter (Signed)
Contacted patient regarding missed appt this morning. Patient states she has called [facility] twice to cancel appt this morning due to waking up with nausea. She states someone was to contact the office. She asked to reschedule appt. Schedule message sent.

## 2019-06-17 NOTE — Telephone Encounter (Signed)
I cld and lft [name] a vm to reschedule her hospital fu appt.

## 2019-06-22 ENCOUNTER — Observation Stay (HOSPITAL_COMMUNITY): Payer: Medicare Other

## 2019-06-22 ENCOUNTER — Encounter (HOSPITAL_COMMUNITY): Payer: Self-pay | Admitting: Emergency Medicine

## 2019-06-22 ENCOUNTER — Emergency Department (HOSPITAL_BASED_OUTPATIENT_CLINIC_OR_DEPARTMENT_OTHER): Payer: Medicare Other

## 2019-06-22 ENCOUNTER — Other Ambulatory Visit: Payer: Self-pay

## 2019-06-22 ENCOUNTER — Inpatient Hospital Stay (HOSPITAL_COMMUNITY)
Admission: EM | Admit: 2019-06-22 | Discharge: 2019-06-24 | DRG: 603 | Disposition: A | Discharge status: Home / Self Care | Payer: Medicare Other | Attending: Internal Medicine | Admitting: Internal Medicine

## 2019-06-22 DIAGNOSIS — Z87891 Personal history of nicotine dependence: Secondary | ICD-10-CM

## 2019-06-22 DIAGNOSIS — M1A9XX0 Chronic gout, unspecified, without tophus (tophi): Secondary | ICD-10-CM | POA: Diagnosis present

## 2019-06-22 DIAGNOSIS — I808 Phlebitis and thrombophlebitis of other sites: Secondary | ICD-10-CM | POA: Diagnosis not present

## 2019-06-22 DIAGNOSIS — Z90710 Acquired absence of both cervix and uterus: Secondary | ICD-10-CM

## 2019-06-22 DIAGNOSIS — D72820 Lymphocytosis (symptomatic): Secondary | ICD-10-CM | POA: Diagnosis present

## 2019-06-22 DIAGNOSIS — I110 Hypertensive heart disease with heart failure: Secondary | ICD-10-CM | POA: Diagnosis present

## 2019-06-22 DIAGNOSIS — R609 Edema, unspecified: Secondary | ICD-10-CM | POA: Diagnosis not present

## 2019-06-22 DIAGNOSIS — Z8541 Personal history of malignant neoplasm of cervix uteri: Secondary | ICD-10-CM

## 2019-06-22 DIAGNOSIS — Z880 Allergy status to penicillin: Secondary | ICD-10-CM

## 2019-06-22 DIAGNOSIS — K219 Gastro-esophageal reflux disease without esophagitis: Secondary | ICD-10-CM | POA: Diagnosis present

## 2019-06-22 DIAGNOSIS — L0390 Cellulitis, unspecified: Secondary | ICD-10-CM

## 2019-06-22 DIAGNOSIS — Z79899 Other long term (current) drug therapy: Secondary | ICD-10-CM

## 2019-06-22 DIAGNOSIS — Z803 Family history of malignant neoplasm of breast: Secondary | ICD-10-CM

## 2019-06-22 DIAGNOSIS — I10 Essential (primary) hypertension: Secondary | ICD-10-CM | POA: Diagnosis present

## 2019-06-22 DIAGNOSIS — R222 Localized swelling, mass and lump, trunk: Secondary | ICD-10-CM | POA: Diagnosis present

## 2019-06-22 DIAGNOSIS — E782 Mixed hyperlipidemia: Secondary | ICD-10-CM | POA: Diagnosis not present

## 2019-06-22 DIAGNOSIS — Z825 Family history of asthma and other chronic lower respiratory diseases: Secondary | ICD-10-CM

## 2019-06-22 DIAGNOSIS — Z83511 Family history of glaucoma: Secondary | ICD-10-CM

## 2019-06-22 DIAGNOSIS — L03113 Cellulitis of right upper limb: Principal | ICD-10-CM | POA: Diagnosis present

## 2019-06-22 DIAGNOSIS — E1165 Type 2 diabetes mellitus with hyperglycemia: Secondary | ICD-10-CM | POA: Diagnosis present

## 2019-06-22 DIAGNOSIS — Z7901 Long term (current) use of anticoagulants: Secondary | ICD-10-CM

## 2019-06-22 DIAGNOSIS — M7989 Other specified soft tissue disorders: Secondary | ICD-10-CM | POA: Diagnosis not present

## 2019-06-22 DIAGNOSIS — I480 Paroxysmal atrial fibrillation: Secondary | ICD-10-CM

## 2019-06-22 DIAGNOSIS — Z79891 Long term (current) use of opiate analgesic: Secondary | ICD-10-CM

## 2019-06-22 DIAGNOSIS — I82611 Acute embolism and thrombosis of superficial veins of right upper extremity: Secondary | ICD-10-CM | POA: Diagnosis present

## 2019-06-22 DIAGNOSIS — E119 Type 2 diabetes mellitus without complications: Secondary | ICD-10-CM

## 2019-06-22 DIAGNOSIS — Z888 Allergy status to other drugs, medicaments and biological substances status: Secondary | ICD-10-CM

## 2019-06-22 DIAGNOSIS — Z9049 Acquired absence of other specified parts of digestive tract: Secondary | ICD-10-CM

## 2019-06-22 DIAGNOSIS — Z794 Long term (current) use of insulin: Secondary | ICD-10-CM

## 2019-06-22 DIAGNOSIS — I5032 Chronic diastolic (congestive) heart failure: Secondary | ICD-10-CM | POA: Diagnosis present

## 2019-06-22 DIAGNOSIS — Z9081 Acquired absence of spleen: Secondary | ICD-10-CM

## 2019-06-22 DIAGNOSIS — Z8249 Family history of ischemic heart disease and other diseases of the circulatory system: Secondary | ICD-10-CM

## 2019-06-22 DIAGNOSIS — Z818 Family history of other mental and behavioral disorders: Secondary | ICD-10-CM

## 2019-06-22 DIAGNOSIS — Z833 Family history of diabetes mellitus: Secondary | ICD-10-CM

## 2019-06-22 DIAGNOSIS — Z20828 Contact with and (suspected) exposure to other viral communicable diseases: Secondary | ICD-10-CM | POA: Diagnosis present

## 2019-06-22 LAB — CBC WITH DIFFERENTIAL/PLATELET
Abs Immature Granulocytes: 0.03 10*3/uL (ref 0.00–0.07)
Basophils Absolute: 0.1 10*3/uL (ref 0.0–0.1)
Basophils Relative: 1 %
Eosinophils Absolute: 0.2 10*3/uL (ref 0.0–0.5)
Eosinophils Relative: 2 %
HCT: 30.3 % — ABNORMAL LOW (ref 36.0–46.0)
Hemoglobin: 9.2 g/dL — ABNORMAL LOW (ref 12.0–15.0)
Immature Granulocytes: 0 %
Lymphocytes Relative: 29 %
Lymphs Abs: 2.8 10*3/uL (ref 0.7–4.0)
MCH: 25.2 pg — ABNORMAL LOW (ref 26.0–34.0)
MCHC: 30.4 g/dL (ref 30.0–36.0)
MCV: 83 fL (ref 80.0–100.0)
Monocytes Absolute: 0.8 10*3/uL (ref 0.1–1.0)
Monocytes Relative: 9 %
Neutro Abs: 5.5 10*3/uL (ref 1.7–7.7)
Neutrophils Relative %: 59 %
Platelets: 539 10*3/uL — ABNORMAL HIGH (ref 150–400)
RBC: 3.65 MIL/uL — ABNORMAL LOW (ref 3.87–5.11)
RDW: 16.1 % — ABNORMAL HIGH (ref 11.5–15.5)
WBC: 9.4 10*3/uL (ref 4.0–10.5)
nRBC: 0 % (ref 0.0–0.2)

## 2019-06-22 LAB — COMPREHENSIVE METABOLIC PANEL
ALT: 27 U/L (ref 0–44)
AST: 30 U/L (ref 15–41)
Albumin: 2.7 g/dL — ABNORMAL LOW (ref 3.5–5.0)
Alkaline Phosphatase: 370 U/L — ABNORMAL HIGH (ref 38–126)
Anion gap: 12 (ref 5–15)
BUN: 23 mg/dL (ref 8–23)
CO2: 27 mmol/L (ref 22–32)
Calcium: 9.1 mg/dL (ref 8.9–10.3)
Chloride: 93 mmol/L — ABNORMAL LOW (ref 98–111)
Creatinine, Ser: 1.11 mg/dL — ABNORMAL HIGH (ref 0.44–1.00)
GFR calc Af Amer: >60 mL/min (ref >60)
GFR calc non Af Amer: 52 mL/min — ABNORMAL LOW (ref >60)
Glucose, Bld: 426 mg/dL — ABNORMAL HIGH (ref 70–99)
Potassium: 4.9 mmol/L (ref 3.5–5.1)
Sodium: 132 mmol/L — ABNORMAL LOW (ref 135–145)
Total Bilirubin: 0.3 mg/dL (ref 0.3–1.2)
Total Protein: 6.6 g/dL (ref 6.5–8.1)

## 2019-06-22 LAB — URINALYSIS, ROUTINE W REFLEX MICROSCOPIC
Bacteria, UA: NONE SEEN
Bilirubin Urine: NEGATIVE
Glucose, UA: >=500 mg/dL — AB
Ketones, ur: NEGATIVE mg/dL
Leukocytes,Ua: NEGATIVE
Nitrite: NEGATIVE
Protein, ur: 100 mg/dL — AB
Specific Gravity, Urine: 1.01 (ref 1.005–1.030)
pH: 5 (ref 5.0–8.0)

## 2019-06-22 LAB — CBG MONITORING, ED
Glucose-Capillary: 358 mg/dL — ABNORMAL HIGH (ref 70–99)
Glucose-Capillary: 304 mg/dL — ABNORMAL HIGH (ref 70–99)
Glucose-Capillary: 80 mg/dL (ref 70–99)

## 2019-06-22 LAB — LACTIC ACID, PLASMA
Lactic Acid, Venous: 0.7 mmol/L (ref 0.5–1.9)
Lactic Acid, Venous: 0.8 mmol/L (ref 0.5–1.9)

## 2019-06-22 LAB — SARS CORONAVIRUS 2 (TAT 6-24 HRS): SARS Coronavirus 2: NEGATIVE

## 2019-06-22 MED ORDER — ONDANSETRON HCL 4 MG PO TABS: 4.0000 mg | ORAL_TABLET | Freq: Four times a day (QID) | ORAL | Status: DC | PRN

## 2019-06-22 MED ORDER — INSULIN GLARGINE 100 UNIT/ML ~~LOC~~ SOLN
15.0000 [IU] | Freq: Every day | SUBCUTANEOUS | Status: DC
  Filled 2019-06-22 (×2): qty 0.15

## 2019-06-22 MED ORDER — IOHEXOL 300 MG/ML  SOLN
100.0000 mL | Freq: Once | INTRAMUSCULAR | Status: AC | PRN
  Administered 2019-06-22: 100 mL via INTRAVENOUS

## 2019-06-22 MED ORDER — SODIUM CHLORIDE 0.9 % IV BOLUS
1000.0000 mL | Freq: Once | INTRAVENOUS | Status: AC
  Administered 2019-06-22: 16:00:00 1000 mL via INTRAVENOUS

## 2019-06-22 MED ORDER — INSULIN ASPART 100 UNIT/ML ~~LOC~~ SOLN
8.0000 [IU] | Freq: Once | SUBCUTANEOUS | Status: AC
  Administered 2019-06-22: 8 [IU] via SUBCUTANEOUS

## 2019-06-22 MED ORDER — LOSARTAN POTASSIUM 25 MG PO TABS
25.0000 mg | ORAL_TABLET | Freq: Every day | ORAL | Status: DC
  Administered 2019-06-23 – 2019-06-24 (×2): 25 mg via ORAL
  Filled 2019-06-22 (×2): qty 1

## 2019-06-22 MED ORDER — APIXABAN 5 MG PO TABS
5.0000 mg | ORAL_TABLET | Freq: Two times a day (BID) | ORAL | Status: DC
  Administered 2019-06-22 – 2019-06-24 (×4): 5 mg via ORAL
  Filled 2019-06-22 (×4): qty 1

## 2019-06-22 MED ORDER — SODIUM CHLORIDE 0.9% FLUSH
3.0000 mL | Freq: Once | INTRAVENOUS | Status: AC
  Administered 2019-06-22: 3 mL via INTRAVENOUS

## 2019-06-22 MED ORDER — CLONAZEPAM 0.5 MG PO TABS: 0.5000 mg | ORAL_TABLET | Freq: Two times a day (BID) | ORAL | Status: DC | PRN

## 2019-06-22 MED ORDER — INSULIN ASPART 100 UNIT/ML ~~LOC~~ SOLN: 0.0000 [IU] | Freq: Every day | SUBCUTANEOUS | Status: DC

## 2019-06-22 MED ORDER — CEFAZOLIN SODIUM-DEXTROSE 1-4 GM/50ML-% IV SOLN
1.0000 g | Freq: Once | INTRAVENOUS | Status: AC
  Administered 2019-06-22: 1 g via INTRAVENOUS
  Filled 2019-06-22: qty 50

## 2019-06-22 MED ORDER — ACETAMINOPHEN 325 MG PO TABS
650.0000 mg | ORAL_TABLET | Freq: Four times a day (QID) | ORAL | Status: DC | PRN
  Administered 2019-06-22: 650 mg via ORAL
  Filled 2019-06-22: qty 2

## 2019-06-22 MED ORDER — METOPROLOL SUCCINATE ER 25 MG PO TB24
25.0000 mg | ORAL_TABLET | Freq: Every day | ORAL | Status: DC
  Administered 2019-06-23 – 2019-06-24 (×2): 25 mg via ORAL
  Filled 2019-06-22 (×2): qty 1

## 2019-06-22 MED ORDER — ONDANSETRON HCL 4 MG/2ML IJ SOLN: 4.0000 mg | Freq: Four times a day (QID) | INTRAMUSCULAR | Status: DC | PRN

## 2019-06-22 MED ORDER — ATORVASTATIN CALCIUM 10 MG PO TABS
10.0000 mg | ORAL_TABLET | Freq: Every day | ORAL | Status: DC
  Administered 2019-06-22 – 2019-06-24 (×3): 10 mg via ORAL
  Filled 2019-06-22 (×3): qty 1

## 2019-06-22 MED ORDER — AMLODIPINE BESYLATE 5 MG PO TABS
5.0000 mg | ORAL_TABLET | Freq: Every day | ORAL | Status: DC
  Administered 2019-06-23 – 2019-06-24 (×2): 5 mg via ORAL
  Filled 2019-06-22 (×2): qty 1

## 2019-06-22 MED ORDER — INSULIN ASPART 100 UNIT/ML ~~LOC~~ SOLN: 0.0000 [IU] | Freq: Three times a day (TID) | SUBCUTANEOUS | Status: DC

## 2019-06-22 MED ORDER — PREGABALIN 50 MG PO CAPS
75.0000 mg | ORAL_CAPSULE | Freq: Two times a day (BID) | ORAL | Status: DC
  Administered 2019-06-22 – 2019-06-24 (×4): 75 mg via ORAL
  Filled 2019-06-22 (×3): qty 1
  Filled 2019-06-22: qty 3

## 2019-06-22 MED ORDER — INSULIN ASPART 100 UNIT/ML ~~LOC~~ SOLN
0.0000 [IU] | Freq: Three times a day (TID) | SUBCUTANEOUS | Status: DC
  Administered 2019-06-22: 11 [IU] via SUBCUTANEOUS
  Administered 2019-06-23: 10 [IU] via SUBCUTANEOUS

## 2019-06-22 MED ORDER — ACETAMINOPHEN 650 MG RE SUPP: 650.0000 mg | Freq: Four times a day (QID) | RECTAL | Status: DC | PRN

## 2019-06-22 MED ORDER — HYDRALAZINE HCL 20 MG/ML IJ SOLN: 10.0000 mg | Freq: Four times a day (QID) | INTRAMUSCULAR | Status: DC | PRN

## 2019-06-22 MED ORDER — TRAMADOL HCL 50 MG PO TABS
50.0000 mg | ORAL_TABLET | Freq: Four times a day (QID) | ORAL | Status: DC | PRN
  Administered 2019-06-22 – 2019-06-23 (×2): 50 mg via ORAL
  Administered 2019-06-23: 100 mg via ORAL
  Filled 2019-06-22 (×2): qty 1
  Filled 2019-06-22: qty 2

## 2019-06-22 MED ORDER — PANTOPRAZOLE SODIUM 40 MG PO TBEC
40.0000 mg | DELAYED_RELEASE_TABLET | Freq: Every day | ORAL | Status: DC
  Administered 2019-06-23 – 2019-06-24 (×2): 40 mg via ORAL
  Filled 2019-06-22 (×2): qty 1

## 2019-06-22 MED ORDER — HYDROCODONE-ACETAMINOPHEN 5-325 MG PO TABS
1.0000 | ORAL_TABLET | Freq: Once | ORAL | Status: AC
  Administered 2019-06-22: 15:00:00 1 via ORAL
  Filled 2019-06-22: qty 1

## 2019-06-22 MED ORDER — SODIUM CHLORIDE 0.9 % IV SOLN
1.0000 g | INTRAVENOUS | Status: DC
  Administered 2019-06-22 – 2019-06-24 (×3): 1 g via INTRAVENOUS
  Filled 2019-06-22 (×3): qty 10
  Filled 2019-06-22: qty 1

## 2019-06-22 NOTE — ED Notes (Signed)
Message sent to Admitting MD per pt's request - Pt asking for Vit D home med.

## 2019-06-22 NOTE — Progress Notes (Signed)
Upper extremity venous has been completed.   Preliminary results in CV Proc.   [name] 06/22/2019 3:41 PM

## 2019-06-22 NOTE — ED Notes (Signed)
Pt transported to CT ?

## 2019-06-22 NOTE — ED Notes (Signed)
Pt noted w/left arm elevated and ice pack. States ice pack is helping some. Requesting Ultram to be given when due at midnight. States arm has been hurting worse since CT d/t had to be manipulated in different positions.

## 2019-06-22 NOTE — ED Notes (Signed)
US at bedside

## 2019-06-22 NOTE — H&P (Signed)
History and Physical    [name]:[id number] DOB: [birth year] DOA: 06/22/2019  PCP: Leeroy Cha, MD  Patient coming from: Home  I have personally briefly reviewed patient's old medical records in [place]  Chief Complaint: Left upper extremity swelling and pain  HPI: [name] is a 63 y.o. female with medical history significant of hypertension, mixed hyperlipidemia, type 2 diabetes mellitus, atrial fibrillation on Eliquis, GERD, gout, chronic lymphocytosis, history of cervical cancer presented to ED with a complaint of left upper extremity pain and swelling.  According to patient, this actually has been going on since about a month.  She also claims that she had mentioned this to the physicians when she was recently hospitalized at [facility] and was discharged on 05/31/2019.  According to her, the pain and swelling both are getting worse.  She went to see her orthopedics doctor and she was prescribed Keflex for presumed cellulitis of the left upper extremity.  She has taken total of 8 days of Keflex however symptoms are getting worse.  She denies having had any fever, chills, sweating, chest pain, shortness of breath, nausea, vomiting, any other complaint.  She called orthopedic doctor yesterday again notifying that her symptoms are getting worse so she was advised to come to the emergency department.  ED Course: Upon arrival to the emergency department, she was hemodynamically stable.  She was thought to be having very minimal erythema on the left upper extremity however there was significantly more swelling compared to what a minimal amount of cellulitis could cause so she underwent left upper extremity venous Doppler ultrasound and was diagnosed with superficial thrombophlebitis with no DVT.  Hospital service was consulted to admit for further management of cellulitis.  She was hyperglycemic in the emergency department.  COVID 19 testing pending.  Review of Systems:  As per HPI otherwise negative.    Past Medical History:  Diagnosis Date   Cancer ([place]) 1977   CERVICAL   GERD (gastroesophageal reflux disease) 11/14/2013   Gout    Mixed hyperlipidemia 11/14/2013    Past Surgical History:  Procedure Laterality Date   ABDOMINAL HYSTERECTOMY  1977   CERVICAL CA   CHOLECYSTECTOMY     EYE SURGERY     6/3 LASER FOR GLAUCOMA, 9/03 CE/IOL IMPLANTS   INGUINAL LYMPH NODE BIOPSY Right 06/08/2018   Procedure: RIGHT INGUINAL LYMPH NODE EXCISIONAL BIOPSY;  Surgeon: Clovis Riley, MD;  Location: [facility];  Service: General;  Laterality: Right;   pancreatectomy     SHOULDER CLOSED REDUCTION Left 02/03/2013   Procedure: CLOSED MANIPULATION SHOULDER;  Surgeon: Vickey Huger, MD;  Location: [facility];  Service: Orthopedics;  Laterality: Left;  CLOSED MANIPULATION LEFT SHOULDER   SPLENECTOMY     TONSILLECTOMY  1970     reports that she quit smoking about 29 years ago. She has never used smokeless tobacco. She reports that she does not drink alcohol or use drugs.  Allergies  Allergen Reactions   Gabapentin Other (See Comments)    Caused upset stomach   Penicillins Hives and Other (See Comments)    Has patient had a PCN reaction causing immediate rash, facial/tongue/throat swelling, SOB or lightheadedness with hypotension: Unknown Has patient had a PCN reaction causing severe rash involving mucus membranes or skin necrosis: Unknown Has patient had a PCN reaction that required hospitalization: Unknown Has patient had a PCN reaction occurring within the last 10 years: No If all of the above answers are "NO", then may proceed  with Cephalosporin use.    Pepcid [Famotidine] Other (See Comments)    HEADACHE    Family History  Problem Relation Age of Onset   Dementia Mother    Heart disease Mother    Hypertension Mother    COPD Mother    Breast cancer Mother 81   Heart disease Father    Diabetes Father    Glaucoma Father    Cancer Father         BLADDER   Depression Sister     Prior to Admission medications   Medication Sig Start Date End Date Taking? Authorizing Provider  amLODipine (NORVASC) 5 MG tablet Take 5 mg by mouth daily.   Yes [provider]  apixaban (ELIQUIS) 5 MG TABS tablet Take 1 tablet (5 mg total) by mouth 2 (two) times daily. 05/17/19  Yes Dixie Dials, MD  atorvastatin (LIPITOR) 10 MG tablet Take 10 mg by mouth daily at 6 PM.  02/14/19  Yes [provider]  cephALEXin (KEFLEX) 500 MG capsule Take 500 mg by mouth 4 (four) times daily. 06/14/19 06/24/19 Yes [provider]  Cholecalciferol (VITAMIN D-3) 5000 units TABS Take 5,000 tablets by mouth every evening.   Yes [provider]  clonazePAM (KLONOPIN) 1 MG tablet Take 0.5 mg by mouth daily as needed for anxiety.  05/27/18  Yes [provider]  insulin NPH-regular Human (70-30) 100 UNIT/ML injection Inject 5-6 Units into the skin See admin instructions. Take 6 units in the morning, and 5 units at night. Hold insulin if blood sugar is less than 99 mg.  Decrease to 3 units if blood sugar is 100 to 140 mg. Patient taking differently: Inject 5-6 Units into the skin See admin instructions. Take 7 units in the morning, 2 units at lunch and 6 units in the evening. Hold insulin if blood sugar is less than 99 mg.  Decrease to 3 units if blood sugar is 100 to 140 mg. 05/17/19  Yes Dixie Dials, MD  losartan (COZAAR) 25 MG tablet Take 1 tablet (25 mg total) by mouth daily. 05/17/19  Yes Dixie Dials, MD  metoprolol succinate (TOPROL-XL) 25 MG 24 hr tablet Take 1 tablet (25 mg total) by mouth daily. 05/18/19  Yes Dixie Dials, MD  omeprazole (PRILOSEC) 20 MG capsule Take 20 mg by mouth daily.   Yes [provider]  pregabalin (LYRICA) 75 MG capsule TAKE 1 CAPSULE BY MOUTH TWICE A DAY Patient taking differently: Take 75 mg by mouth at bedtime.  12/31/18  Yes Trula Slade, DPM  Propylene Glycol (SYSTANE COMPLETE) 0.6  % SOLN Place 1 drop into both eyes 2 (two) times daily as needed (for dry eyes).   Yes [provider]  traMADol (ULTRAM) 50 MG tablet Take 50-100 mg by mouth every 6 (six) hours as needed (pain).    Yes [provider]  triamcinolone (NASACORT ALLERGY 24HR) 55 MCG/ACT AERO nasal inhaler Place 2 sprays into the nose daily as needed.   Yes [provider]  Blood Glucose Monitoring Suppl (ONETOUCH VERIO) w/Device KIT 1 kit by Does not apply route 3 (three) times daily. 12/24/16   Unk Pinto, MD  glucose blood (ONETOUCH VERIO) test strip CHECK BLOOD SUGAR 3 TIMES DAILY-DX-E11.9 AND Z79.4. 12/24/16   Unk Pinto, MD  [facility] DELICA LANCETS 82N MISC Check blood sugar 3 times daily-DX-E11.9 and Z79.4. 01/23/17   Unk Pinto, MD    Physical Exam: Vitals:   06/22/19 1520 06/22/19 1609 06/22/19 1610 06/22/19 1615  BP: (!) 165/71 (!) 172/70  (!) 170/77  Pulse: 69  71 69  Resp:      Temp:      TempSrc:      SpO2: 95%  94% (!) 89%    Constitutional: NAD, calm, comfortable Vitals:   06/22/19 1520 06/22/19 1609 06/22/19 1610 06/22/19 1615  BP: (!) 165/71 (!) 172/70  (!) 170/77  Pulse: 69  71 69  Resp:      Temp:      TempSrc:      SpO2: 95%  94% (!) 89%   Eyes: PERRL, lids and conjunctivae normal ENMT: Mucous membranes are moist. Posterior pharynx clear of any exudate or lesions.Normal dentition.  Neck: normal, supple, no masses, no thyromegaly Respiratory: clear to auscultation bilaterally, no wheezing, no crackles. Normal respiratory effort. No accessory muscle use.  Cardiovascular: Regular rate and rhythm, no murmurs / rubs / gallops.  +1 pitting edema bilateral lower extremity, 2+ pedal pulses. No carotid bruits.  Abdomen: no tenderness, no masses palpated. No hepatosplenomegaly. Bowel sounds positive.  Musculoskeletal: no clubbing / cyanosis. No joint deformity upper and lower extremities.  Limited range of motion at the elbow and left upper extremity.   Edematous left upper extremity with very minimal scattered erythema on the palmar side of the lower half of the left upper extremity.  No open sores or ulcers.  No drainage. Skin: no rashes, lesions, ulcers. No induration Neurologic: CN 2-12 grossly intact. Sensation intact, DTR normal. Strength 5/5 in all 4.  Psychiatric: Normal judgment and insight. Alert and oriented x 3. Normal mood.    Labs on Admission: I have personally reviewed following labs and imaging studies  CBC: Recent Labs  Lab 06/22/19 1136  WBC 9.4  NEUTROABS 5.5  HGB 9.2*  HCT 30.3*  MCV 83.0  PLT 419*   Basic Metabolic Panel: Recent Labs  Lab 06/22/19 1136  NA 132*  K 4.9  CL 93*  CO2 27  GLUCOSE 426*  BUN 23  CREATININE 1.11*  CALCIUM 9.1   GFR: Estimated Creatinine Clearance: 40.3 mL/min (A) (by C-G formula based on SCr of 1.11 mg/dL (H)). Liver Function Tests: Recent Labs  Lab 06/22/19 1136  AST 30  ALT 27  ALKPHOS 370*  BILITOT 0.3  PROT 6.6  ALBUMIN 2.7*   No results for input(s): LIPASE, AMYLASE in the last 168 hours. No results for input(s): AMMONIA in the last 168 hours. Coagulation Profile: No results for input(s): INR, PROTIME in the last 168 hours. Cardiac Enzymes: No results for input(s): CKTOTAL, CKMB, CKMBINDEX, TROPONINI in the last 168 hours. BNP (last 3 results) No results for input(s): PROBNP in the last 8760 hours. HbA1C: No results for input(s): HGBA1C in the last 72 hours. CBG: Recent Labs  Lab 06/22/19 1451  GLUCAP 358*   Lipid Profile: No results for input(s): CHOL, HDL, LDLCALC, TRIG, CHOLHDL, LDLDIRECT in the last 72 hours. Thyroid Function Tests: No results for input(s): TSH, T4TOTAL, FREET4, T3FREE, THYROIDAB in the last 72 hours. Anemia Panel: No results for input(s): VITAMINB12, FOLATE, FERRITIN, TIBC, IRON, RETICCTPCT in the last 72 hours. Urine analysis:    Component Value Date/Time   COLORURINE STRAW (A) 05/15/2019 0736   APPEARANCEUR CLEAR  05/15/2019 0736   LABSPEC 1.003 (L) 05/15/2019 0736   PHURINE 8.0 05/15/2019 0736   GLUCOSEU 50 (A) 05/15/2019 0736   HGBUR SMALL (A) 05/15/2019 0736   BILIRUBINUR NEGATIVE 05/15/2019 0736   KETONESUR NEGATIVE 05/15/2019 0736   PROTEINUR 30 (A) 05/15/2019  0736   UROBILINOGEN 0.2 12/07/2011 1031   NITRITE NEGATIVE 05/15/2019 0736   LEUKOCYTESUR NEGATIVE 05/15/2019 0736    Radiological Exams on Admission: Ue Venous Duplex (mc & Wl 7 Am - 7 Pm)  Result Date: 06/22/2019 UPPER VENOUS STUDY  Indications: Edema Comparison Study: no prior Performing Technologist: [name] RVS  Examination Guidelines: A complete evaluation includes B-mode imaging, spectral Doppler, color Doppler, and power Doppler as needed of all accessible portions of each vessel. Bilateral testing is considered an integral part of a complete examination. Limited examinations for reoccurring indications may be performed as noted.  Left Findings: +----------+------------+---------+-----------+----------+-------+  LEFT       Compressible Phasicity Spontaneous Properties Summary  +----------+------------+---------+-----------+----------+-------+  IJV            Full        Yes        Yes                         +----------+------------+---------+-----------+----------+-------+  Subclavian     Full        Yes        Yes                         +----------+------------+---------+-----------+----------+-------+  Axillary       Full        Yes        Yes                         +----------+------------+---------+-----------+----------+-------+  Brachial       Full        Yes        Yes                         +----------+------------+---------+-----------+----------+-------+  Radial         Full                                               +----------+------------+---------+-----------+----------+-------+  Ulnar          Full                                               +----------+------------+---------+-----------+----------+-------+   Cephalic       None                                      forearm  +----------+------------+---------+-----------+----------+-------+  Basilic        Full                                               +----------+------------+---------+-----------+----------+-------+  Summary:  Left: No evidence of deep vein thrombosis in the upper extremity. Findings consistent with age indeterminate superficial vein thrombosis involving the left cephalic vein.  *See table(s) above for measurements and observations.  Diagnosing physician: Deitra Mayo MD Electronically signed by Deitra Mayo MD on 06/22/2019 at 4:24:19 PM.  Final     Assessment/Plan Active Problems:   Essential hypertension   Mixed hyperlipidemia   Cellulitis of right upper arm   Thrombophlebitis of left arm   Left upper extremity cellulitis/swelling/thrombophlebitis: Interestingly, patient's swelling is very out of proportion for very minimal cellulitis or thrombophlebitis.  This is restricting her range of motion.  Now that she has been having this for a month without any improvement despite of using antibiotic so I will proceed with CT of the upper extremity to rule out any other pathology.  She received cefepime in the emergency department however I will treat her with Rocephin for cellulitis.  Advised her to keep her extremity elevated and apply cold compresses for thrombophlebitis.  Continue Eliquis from home.  Hypertension: Slightly elevated in the emergency department.  Resume home medications and place on PRN hydralazine.  Type 2 diabetes mellitus with hyperglycemia: She seems to be taking only 5-6 units of 70/30 twice daily.  Since she is hyperglycemic, I am going to start her on Lantus 15 units starting tonight and continue SSI.  Paroxysmal atrial fibrillation: Rate controlled.  Resume home beta-blocker.  Resume Eliquis.  Chronic diastolic congestive heart failure: Appears euvolemic however she has very minimal edema.   She is not on any diuretics.  Her last echo was last month which showed normal ejection fraction but impaired relaxation.  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  She was asked to follow-up as an outpatient for PET scan which she had on 06/09/2019 and that did not show any hypermetabolic mass to suggest neoplastic process and there was mildly enlarged left prevascular lymph node which was stable from chest CT done on 05/28/2019 and was not hypermetabolic compatible with reactive node.?  Wonder if this is also contributing to left upper extremity swelling.  DVT prophylaxis: Eliquis Code Status: Full code Family Communication: None present at bedside.  Plan of care discussed with patient in length and he verbalized understanding and agreed with it. Disposition Plan: Will likely be discharged home in next 1 to 2 days Consults called: None Admission status: Observation   Darliss Cheney MD [facility] Pager [phone]  If 7PM-7AM, please contact night-coverage [email or web address] Password [facility]  06/22/2019, 5:04 PM

## 2019-06-22 NOTE — ED Triage Notes (Signed)
Pt arrives to ED with swelling and pain in left arm starting around her left elbow and into left forearm and hand. Pt was sent over from her ortho doctor for cellulitis .

## 2019-06-22 NOTE — ED Notes (Signed)
Pt arrived to Rm [facility] via stretcher. Pt noted to be ambulatory w/o difficulty. Pt's left arm noted to be swollen. Pt's belongings placed on counter in room per pt's request. Pt has her cell phone. Pt aware of tx plan - waiting on bed assignment.

## 2019-06-22 NOTE — ED Notes (Signed)
Pt eating sandwich given as requested. Pt declined Insulin d/t CBG 80.

## 2019-06-22 NOTE — ED Notes (Signed)
Pharmacy to send Lyrica - none in either Pyxis. Pt aware.

## 2019-06-22 NOTE — ED Notes (Signed)
ED Provider at bedside. 

## 2019-06-22 NOTE — ED Provider Notes (Signed)
[facility] EMERGENCY DEPARTMENT Provider Note   CSN: [id number] Arrival date & time: 06/22/19  1027     History   Chief Complaint Chief Complaint  Patient presents with  . Joint Swelling    HPI [name] is a 63 y.o. female.     HPI   Patient is a 63 year old female with a history of cervical cancer, GERD, gout, hyperlipidemia, DM, afib (on eliquis) who presents the emergency department today for evaluation of left upper extremity swelling.  States it has been swollen for several weeks.  States she was admitted and during admission she had a blood glucose monitoring device attached to her left upper extremity.  Upon removal of this she developed redness and swelling to the left upper extremity.  This is ongoing for a few weeks.  She was diagnosed with possible cellulitis and has been following with her orthopedic doctor about this.  She has been on Keflex 500 mg 4 times daily for the last several days and has seen no improvement of her symptoms.  She is actually had increased swelling since then and has been in pain.   She denies fevers, chest pain, shortness of breath or cough.  Past Medical History:  Diagnosis Date  . Cancer ([place]) 1977   CERVICAL  . GERD (gastroesophageal reflux disease) 11/14/2013  . Gout   . Mixed hyperlipidemia 11/14/2013    Patient Active Problem List   Diagnosis Date Noted  . Cellulitis of right upper arm 06/22/2019  . 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 Elevated LFTs   . Pleural effusion   . Gout 06/11/2015  . Non-compliance with treatment 09/20/2014  . Vitamin D deficiency 09/07/2014  . GERD (gastroesophageal reflux disease) 11/14/2013  . Essential hypertension 11/14/2013  . Mixed hyperlipidemia 11/14/2013  . 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 CHOLECYSTECTOMY    . EYE SURGERY     6/3 LASER FOR GLAUCOMA, 9/03 CE/IOL IMPLANTS   . INGUINAL LYMPH NODE BIOPSY Right 06/08/2018   Procedure: RIGHT INGUINAL LYMPH NODE EXCISIONAL BIOPSY;  Surgeon: Clovis Riley, MD;  Location: [facility];  Service: General;  Laterality: Right;  . pancreatectomy    . SHOULDER CLOSED REDUCTION Left 02/03/2013   Procedure: CLOSED MANIPULATION SHOULDER;  Surgeon: Vickey Huger, MD;  Location: [facility];  Service: Orthopedics;  Laterality: Left;  CLOSED MANIPULATION LEFT SHOULDER  . SPLENECTOMY    . TONSILLECTOMY  1970     OB History   No obstetric history on file.      Home Medications    Prior to Admission medications   Medication Sig Start Date End Date Taking? Authorizing Provider  amLODipine (NORVASC) 5 MG tablet Take 5 mg by mouth daily.   Yes [provider]  atorvastatin (LIPITOR) 10 MG tablet Take 10 mg by mouth daily at 6 PM.  02/14/19  Yes [provider]  insulin NPH-regular Human (70-30) 100 UNIT/ML injection Inject 5-6 Units into the skin See admin instructions. Take 6 units in the morning, and 5 units at night. Hold insulin if blood sugar is less than 99 mg.  Decrease to 3 units if blood sugar is 100 to 140 mg. 05/17/19  Yes Dixie Dials, MD  losartan (COZAAR) 25 MG tablet Take 1 tablet (25 mg total) by mouth daily. 05/17/19  Yes Dixie Dials, MD  omeprazole (PRILOSEC) 20 MG capsule Take 20 mg by  mouth daily.   Yes [provider]  pregabalin (LYRICA) 75 MG capsule TAKE 1 CAPSULE BY MOUTH TWICE A DAY Patient taking differently: Take 75 mg by mouth at bedtime.  12/31/18  Yes Trula Slade, DPM  traMADol (ULTRAM) 50 MG tablet Take 50-100 mg by mouth every 6 (six) hours as needed.   Yes [provider]  apixaban (ELIQUIS) 5 MG TABS tablet Take 1 tablet (5 mg total) by mouth 2 (two) times daily. 05/17/19   Dixie Dials, MD  Blood Glucose Monitoring Suppl (ONETOUCH VERIO) w/Device KIT 1 kit by Does not apply route 3 (three) times daily. 12/24/16   Unk Pinto, MD  Cholecalciferol (VITAMIN D-3)  5000 units TABS Take 5,000 tablets by mouth every evening.    [provider]  clonazePAM (KLONOPIN) 1 MG tablet Take 0.5-1 mg by mouth daily as needed for anxiety.  05/27/18   [provider]  dextromethorphan-guaiFENesin (MUCINEX DM) 30-600 MG 12hr tablet Take 1 tablet by mouth 2 (two) times daily as needed for cough.    [provider]  glucose blood (ONETOUCH VERIO) test strip CHECK BLOOD SUGAR 3 TIMES DAILY-DX-E11.9 AND Z79.4. 12/24/16   Unk Pinto, MD  metoprolol succinate (TOPROL-XL) 25 MG 24 hr tablet Take 1 tablet (25 mg total) by mouth daily. 05/18/19   Dixie Dials, MD  [facility] DELICA LANCETS 29B MISC Check blood sugar 3 times daily-DX-E11.9 and Z79.4. 01/23/17   Unk Pinto, MD  Propylene Glycol (SYSTANE COMPLETE) 0.6 % SOLN Place 1 drop into both eyes 2 (two) times daily as needed (for dry eyes).    [provider]    Family History Family History  Problem Relation Age of Onset  . Dementia Mother   . Heart disease Mother   . Hypertension Mother   . COPD Mother   . Breast cancer Mother 70  . Heart disease Father   . Diabetes Father   . Glaucoma Father   . Cancer Father        BLADDER  . Depression Sister     Social History Social History   Tobacco Use  . Smoking status: Former Smoker    Quit date: 09/23/1989    Years since quitting: 29.7  . Smokeless tobacco: Never Used  Substance Use Topics  . Alcohol use: No    Alcohol/week: 0.0 standard drinks  . Drug use: No    Comment: quit>20years ago     Allergies   Gabapentin, Penicillins, and Pepcid [famotidine]   Review of Systems Review of Systems  Constitutional: Negative for chills and fever.  HENT: Negative for ear pain and sore throat.   Eyes: Negative for visual disturbance.  Respiratory: Negative for cough and shortness of breath.   Cardiovascular: Negative for chest pain.  Gastrointestinal: Negative for abdominal pain, constipation, diarrhea, nausea and vomiting.   Genitourinary: Negative for dysuria and hematuria.  Musculoskeletal:       LUE edema/pain  Skin: Negative for rash.  Neurological: Negative for headaches.  All other systems reviewed and are negative.    Physical Exam Updated Vital Signs BP (!) 165/71   Pulse 69   Temp 98.8 F (37.1 C) (Oral)   Resp 16   SpO2 95%   Physical Exam Vitals signs and nursing note reviewed.  Constitutional:      General: She is not in acute distress.    Appearance: She is well-developed.  HENT:     Head: Normocephalic and atraumatic.  Eyes:     Conjunctiva/sclera:  Conjunctivae normal.  Neck:     Musculoskeletal: Neck supple.  Cardiovascular:     Rate and Rhythm: Normal rate and regular rhythm.     Heart sounds: No murmur.  Pulmonary:     Effort: Pulmonary effort is normal. No respiratory distress.     Breath sounds: Normal breath sounds.  Abdominal:     Palpations: Abdomen is soft.     Tenderness: There is no abdominal tenderness.  Musculoskeletal:     Comments: LUE edema and mild TTP. Compartments are soft. Radial/ulnar pulses intact. Brisk cap refill to fingers. Mild erythema to the medial aspect of the LUE.   Skin:    General: Skin is warm and dry.  Neurological:     Mental Status: She is alert.     ED Treatments / Results  Labs (all labs ordered are listed, but only abnormal results are displayed) Labs Reviewed  COMPREHENSIVE METABOLIC PANEL - Abnormal; Notable for the following components:      Result Value   Sodium 132 (*)    Chloride 93 (*)    Glucose, Bld 426 (*)    Creatinine, Ser 1.11 (*)    Albumin 2.7 (*)    Alkaline Phosphatase 370 (*)    GFR calc non Af Amer 52 (*)    All other components within normal limits  CBC WITH DIFFERENTIAL/PLATELET - Abnormal; Notable for the following components:   RBC 3.65 (*)    Hemoglobin 9.2 (*)    HCT 30.3 (*)    MCH 25.2 (*)    RDW 16.1 (*)    Platelets 539 (*)    All other components within normal limits  CBG MONITORING,  ED - Abnormal; Notable for the following components:   Glucose-Capillary 358 (*)    All other components within normal limits  LACTIC ACID, PLASMA  LACTIC ACID, PLASMA  URINALYSIS, ROUTINE W REFLEX MICROSCOPIC    EKG None  Radiology Ue Venous Duplex (mc & Wl 7 Am - 7 Pm)  Result Date: 06/22/2019 UPPER VENOUS STUDY  Indications: Edema Comparison Study: no prior Performing Technologist: [name] RVS  Examination Guidelines: A complete evaluation includes B-mode imaging, spectral Doppler, color Doppler, and power Doppler as needed of all accessible portions of each vessel. Bilateral testing is considered an integral part of a complete examination. Limited examinations for reoccurring indications may be performed as noted.  Left Findings: +----------+------------+---------+-----------+----------+-------+ LEFT      CompressiblePhasicitySpontaneousPropertiesSummary +----------+------------+---------+-----------+----------+-------+ IJV           Full       Yes       Yes                      +----------+------------+---------+-----------+----------+-------+ Subclavian    Full       Yes       Yes                      +----------+------------+---------+-----------+----------+-------+ Axillary      Full       Yes       Yes                      +----------+------------+---------+-----------+----------+-------+ Brachial      Full       Yes       Yes                      +----------+------------+---------+-----------+----------+-------+ Radial  Full                                          +----------+------------+---------+-----------+----------+-------+ Ulnar         Full                                          +----------+------------+---------+-----------+----------+-------+ Cephalic      None                                  forearm +----------+------------+---------+-----------+----------+-------+ Basilic       Full                                           +----------+------------+---------+-----------+----------+-------+  Summary:  Left: No evidence of deep vein thrombosis in the upper extremity. Findings consistent with age indeterminate superficial vein thrombosis involving the left cephalic vein.  *See table(s) above for measurements and observations.    Preliminary     Procedures Procedures (including critical care time)  Medications Ordered in ED Medications  sodium chloride flush (NS) 0.9 % injection 3 mL (has no administration in time range)  ceFAZolin (ANCEF) IVPB 1 g/50 mL premix (has no administration in time range)  sodium chloride 0.9 % bolus 1,000 mL (has no administration in time range)  insulin aspart (novoLOG) injection 8 Units (has no administration in time range)  HYDROcodone-acetaminophen (NORCO/VICODIN) 5-325 MG per tablet 1 tablet (1 tablet Oral Given 06/22/19 1439)     Initial Impression / Assessment and Plan / ED Course  I have reviewed the triage vital signs and the nursing notes.  Pertinent labs & imaging results that were available during my care of the patient were reviewed by me and considered in my medical decision making (see chart for details).   Final Clinical Impressions(s) / ED Diagnoses   Final diagnoses:  Cellulitis, unspecified cellulitis site   63 y/o female presenting to the ED c/o LUE edema and pain. Currently on a course of keflex for cellulitis but has had no improvement.   CBC without leukocytosis, no anemia CMP with elevated glucose with normal bicarb and no elevated anion gap  Lactic acid negative, I doubt sepsis UA pending.   LUE US shows preliminary report reveal small portion of superficial clot in the cephalic vein, but not DVT.  Pt was given a dose of cefazolin in the ID.  Will plan for admission given that she has failed outpatient therapy of her cellulitis.   PT seen in conjunction with Dr. Alvino Chapel who personally evaluated the patient and is in agreement  with plan.   3:50 PM CONSULT with P[facility] who accepts pt for admission   ED Discharge Orders    None       Bishop Dublin 06/22/19 1603    Davonna Belling, MD 06/23/19 [phone]

## 2019-06-22 NOTE — ED Notes (Signed)
Pt has returned from CT.  

## 2019-06-23 ENCOUNTER — Other Ambulatory Visit: Payer: PRIVATE HEALTH INSURANCE

## 2019-06-23 DIAGNOSIS — M7989 Other specified soft tissue disorders: Secondary | ICD-10-CM | POA: Diagnosis present

## 2019-06-23 DIAGNOSIS — I5032 Chronic diastolic (congestive) heart failure: Secondary | ICD-10-CM | POA: Diagnosis present

## 2019-06-23 DIAGNOSIS — Z20828 Contact with and (suspected) exposure to other viral communicable diseases: Secondary | ICD-10-CM | POA: Diagnosis present

## 2019-06-23 DIAGNOSIS — Z87891 Personal history of nicotine dependence: Secondary | ICD-10-CM | POA: Diagnosis not present

## 2019-06-23 DIAGNOSIS — I82611 Acute embolism and thrombosis of superficial veins of right upper extremity: Secondary | ICD-10-CM | POA: Diagnosis present

## 2019-06-23 DIAGNOSIS — E119 Type 2 diabetes mellitus without complications: Secondary | ICD-10-CM

## 2019-06-23 DIAGNOSIS — E782 Mixed hyperlipidemia: Secondary | ICD-10-CM | POA: Diagnosis present

## 2019-06-23 DIAGNOSIS — L03113 Cellulitis of right upper limb: Secondary | ICD-10-CM | POA: Diagnosis present

## 2019-06-23 DIAGNOSIS — Z9081 Acquired absence of spleen: Secondary | ICD-10-CM | POA: Diagnosis not present

## 2019-06-23 DIAGNOSIS — Z90710 Acquired absence of both cervix and uterus: Secondary | ICD-10-CM | POA: Diagnosis not present

## 2019-06-23 DIAGNOSIS — E1169 Type 2 diabetes mellitus with other specified complication: Secondary | ICD-10-CM

## 2019-06-23 DIAGNOSIS — Z794 Long term (current) use of insulin: Secondary | ICD-10-CM

## 2019-06-23 DIAGNOSIS — D72820 Lymphocytosis (symptomatic): Secondary | ICD-10-CM | POA: Diagnosis present

## 2019-06-23 DIAGNOSIS — Z888 Allergy status to other drugs, medicaments and biological substances status: Secondary | ICD-10-CM | POA: Diagnosis not present

## 2019-06-23 DIAGNOSIS — I10 Essential (primary) hypertension: Secondary | ICD-10-CM | POA: Diagnosis not present

## 2019-06-23 DIAGNOSIS — Z79891 Long term (current) use of opiate analgesic: Secondary | ICD-10-CM | POA: Diagnosis not present

## 2019-06-23 DIAGNOSIS — L0390 Cellulitis, unspecified: Secondary | ICD-10-CM | POA: Diagnosis present

## 2019-06-23 DIAGNOSIS — Z79899 Other long term (current) drug therapy: Secondary | ICD-10-CM | POA: Diagnosis not present

## 2019-06-23 DIAGNOSIS — E1165 Type 2 diabetes mellitus with hyperglycemia: Secondary | ICD-10-CM | POA: Diagnosis present

## 2019-06-23 DIAGNOSIS — Z9049 Acquired absence of other specified parts of digestive tract: Secondary | ICD-10-CM | POA: Diagnosis not present

## 2019-06-23 DIAGNOSIS — Z880 Allergy status to penicillin: Secondary | ICD-10-CM | POA: Diagnosis not present

## 2019-06-23 DIAGNOSIS — Z8541 Personal history of malignant neoplasm of cervix uteri: Secondary | ICD-10-CM | POA: Diagnosis not present

## 2019-06-23 DIAGNOSIS — I110 Hypertensive heart disease with heart failure: Secondary | ICD-10-CM | POA: Diagnosis present

## 2019-06-23 DIAGNOSIS — I808 Phlebitis and thrombophlebitis of other sites: Secondary | ICD-10-CM | POA: Diagnosis present

## 2019-06-23 DIAGNOSIS — I480 Paroxysmal atrial fibrillation: Secondary | ICD-10-CM | POA: Diagnosis present

## 2019-06-23 DIAGNOSIS — K219 Gastro-esophageal reflux disease without esophagitis: Secondary | ICD-10-CM | POA: Diagnosis present

## 2019-06-23 DIAGNOSIS — R222 Localized swelling, mass and lump, trunk: Secondary | ICD-10-CM | POA: Diagnosis present

## 2019-06-23 DIAGNOSIS — Z7901 Long term (current) use of anticoagulants: Secondary | ICD-10-CM | POA: Diagnosis not present

## 2019-06-23 DIAGNOSIS — M1A9XX0 Chronic gout, unspecified, without tophus (tophi): Secondary | ICD-10-CM | POA: Diagnosis present

## 2019-06-23 LAB — COMPREHENSIVE METABOLIC PANEL
ALT: 21 U/L (ref 0–44)
AST: 25 U/L (ref 15–41)
Albumin: 2.5 g/dL — ABNORMAL LOW (ref 3.5–5.0)
Alkaline Phosphatase: 311 U/L — ABNORMAL HIGH (ref 38–126)
Anion gap: 8 (ref 5–15)
BUN: 21 mg/dL (ref 8–23)
CO2: 27 mmol/L (ref 22–32)
Calcium: 8.7 mg/dL — ABNORMAL LOW (ref 8.9–10.3)
Chloride: 98 mmol/L (ref 98–111)
Creatinine, Ser: 0.94 mg/dL (ref 0.44–1.00)
GFR calc Af Amer: >60 mL/min (ref >60)
GFR calc non Af Amer: >60 mL/min (ref >60)
Glucose, Bld: 227 mg/dL — ABNORMAL HIGH (ref 70–99)
Potassium: 4.7 mmol/L (ref 3.5–5.1)
Sodium: 133 mmol/L — ABNORMAL LOW (ref 135–145)
Total Bilirubin: 0.5 mg/dL (ref 0.3–1.2)
Total Protein: 6 g/dL — ABNORMAL LOW (ref 6.5–8.1)

## 2019-06-23 LAB — CBC
HCT: 28.7 % — ABNORMAL LOW (ref 36.0–46.0)
Hemoglobin: 8.7 g/dL — ABNORMAL LOW (ref 12.0–15.0)
MCH: 25.1 pg — ABNORMAL LOW (ref 26.0–34.0)
MCHC: 30.3 g/dL (ref 30.0–36.0)
MCV: 82.7 fL (ref 80.0–100.0)
Platelets: 528 10*3/uL — ABNORMAL HIGH (ref 150–400)
RBC: 3.47 MIL/uL — ABNORMAL LOW (ref 3.87–5.11)
RDW: 16.2 % — ABNORMAL HIGH (ref 11.5–15.5)
WBC: 10.9 10*3/uL — ABNORMAL HIGH (ref 4.0–10.5)
nRBC: 0 % (ref 0.0–0.2)

## 2019-06-23 LAB — GLUCOSE, CAPILLARY
Glucose-Capillary: 244 mg/dL — ABNORMAL HIGH (ref 70–99)
Glucose-Capillary: 333 mg/dL — ABNORMAL HIGH (ref 70–99)
Glucose-Capillary: 210 mg/dL — ABNORMAL HIGH (ref 70–99)
Glucose-Capillary: 276 mg/dL — ABNORMAL HIGH (ref 70–99)
Glucose-Capillary: 339 mg/dL — ABNORMAL HIGH (ref 70–99)

## 2019-06-23 MED ORDER — INSULIN ASPART PROT & ASPART (70-30 MIX) 100 UNIT/ML ~~LOC~~ SUSP
7.0000 [IU] | Freq: Every day | SUBCUTANEOUS | Status: DC
  Administered 2019-06-24: 7 [IU] via SUBCUTANEOUS
  Filled 2019-06-23: qty 10

## 2019-06-23 MED ORDER — INSULIN ASPART PROT & ASPART (70-30 MIX) 100 UNIT/ML ~~LOC~~ SUSP: 2.0000 [IU] | Freq: Three times a day (TID) | SUBCUTANEOUS | Status: DC

## 2019-06-23 MED ORDER — HYDROCODONE-ACETAMINOPHEN 5-325 MG PO TABS
1.0000 | ORAL_TABLET | ORAL | Status: DC | PRN
  Administered 2019-06-23 – 2019-06-24 (×6): 1 via ORAL
  Filled 2019-06-23 (×6): qty 1

## 2019-06-23 MED ORDER — INSULIN ASPART PROT & ASPART (70-30 MIX) 100 UNIT/ML ~~LOC~~ SUSP
6.0000 [IU] | Freq: Every day | SUBCUTANEOUS | Status: DC
  Administered 2019-06-23 – 2019-06-24 (×2): 6 [IU] via SUBCUTANEOUS
  Filled 2019-06-23: qty 10

## 2019-06-23 MED ORDER — INSULIN ASPART PROT & ASPART (70-30 MIX) 100 UNIT/ML ~~LOC~~ SUSP
2.0000 [IU] | Freq: Every day | SUBCUTANEOUS | Status: DC
  Administered 2019-06-23 – 2019-06-24 (×2): 2 [IU] via SUBCUTANEOUS
  Filled 2019-06-23: qty 10

## 2019-06-23 NOTE — Plan of Care (Signed)
Patient progressing according to plan of care.

## 2019-06-23 NOTE — Progress Notes (Signed)
PROGRESS NOTE    LOETTE FAUERBACH   [id number]  DOB: March 10, 1956  DOA: 06/22/2019 PCP: Leeroy Cha, MD   Brief Narrative:  DEVAN MADRIL is a 63 y.o. 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 She was diagnosed with cellulitis and was admitted for treatment as she had failed 2 weeks of Keflex.  Left arm venous duplex revealed superficial thrombosis in the left cephalic vein.  Subjective: Patient continues to have pain in the left arm along with swelling.    Assessment & Plan:   Principal Problem:   Cellulitis of right upper arm -She states she has had repeated episodes of cellulitis in the same arm -She will need to continue on ceftriaxone for at least another 24 hours --she has had mild improvement but not sufficient enough to transition her to oral antibiotics today -she continues to be in pain and I have changed tramadol to hydrocodone as Tramadol is ineffective for her  Active Problems:   Thrombophlebitis of left arm - - Continue ice packs to the swollen area and continue to elevate arm as much as possible    Essential hypertension  -Continue amlodipine, losartan, Toprol and as needed hydralazine IV    Mixed hyperlipidemia -Continue Lipitor    PAF (paroxysmal atrial fibrillation)  -Continue Eliquis and Toprol    DM (diabetes mellitus), type 2 (HCC) -Resume 70/30 at the doses that she is taking at home    H/O splenectomy  Time spent in minutes: 35 DVT prophylaxis: Eliquis Code Status: Full code Family Communication:  Disposition Plan: Home hopefully in 1 to 2 days as cellulitis improves Consultants:   None Procedures:   None Antimicrobials:   Anti-infectives (From admission, onward)   Start     Dose/Rate Route Frequency Ordered Stop   06/22/19 1715  cefTRIAXone (ROCEPHIN) 1 g in sodium chloride 0.9 % 100 mL IVPB     1 g 200 mL/hr over 30 Minutes Intravenous Every 24 hours 06/22/19 1701     06/22/19 1415  ceFAZolin (ANCEF) IVPB 1 g/50 mL premix     1 g 100 mL/hr over 30 Minutes Intravenous  Once 06/22/19 1411 06/22/19 1558       Objective: Vitals:   06/22/19 1736 06/23/19 0022 06/23/19 0452 06/23/19 1408  BP: (!) 153/79 (!) 158/86 (!) 150/79 139/76  Pulse: 63 74 83 69  Resp: 16 17 17 18   Temp:  98.2 F (36.8 C) 98.1 F (36.7 C) 98.3 F (36.8 C)  TempSrc:  Oral Oral Oral  SpO2: 100% 96% 92% 97%    Intake/Output Summary (Last 24 hours) at 06/23/2019 1436 Last data filed at 06/23/2019 0700 Gross per 24 hour  Intake 395 ml  Output 1 ml  Net 394 ml   There were no vitals filed for this visit.  Examination: General exam: Appears comfortable  HEENT: PERRLA, oral mucosa moist, no sclera icterus or thrush Respiratory system: Clear to auscultation. Respiratory effort normal. Cardiovascular system: S1 & S2 heard, RRR.   Gastrointestinal system: Abdomen soft, non-tender, nondistended. Normal bowel sounds. Central nervous system: Alert and oriented. No focal neurological deficits. Extremities: No cyanosis, clubbing-there is edema, erythema and tenderness of left forearm Skin: No rashes or ulcers-see above Psychiatry:  Mood & affect appropriate.  Data Reviewed: I have personally reviewed following labs and imaging studies  CBC: Recent Labs  Lab 06/22/19 1136 06/23/19 0242  WBC 9.4 10.9*  NEUTROABS 5.5  --   HGB 9.2* 8.7*  HCT 30.3* 28.7*  MCV 83.0 82.7  PLT 539* 0000000*   Basic Metabolic Panel: Recent Labs  Lab 06/22/19 1136 06/23/19 0242  NA 132* 133*  K 4.9 4.7  CL 93* 98  CO2 27 27  GLUCOSE 426* 227*  BUN 23 21  CREATININE 1.11* 0.94  CALCIUM 9.1 8.7*   GFR: Estimated Creatinine Clearance:  47.6 mL/min (by C-G formula based on SCr of 0.94 mg/dL). Liver Function Tests: Recent Labs  Lab 06/22/19 1136 06/23/19 0242  AST 30 25  ALT 27 21  ALKPHOS 370* 311*  BILITOT 0.3 0.5  PROT 6.6 6.0*  ALBUMIN 2.7* 2.5*   No results for input(s): LIPASE, AMYLASE in the last 168 hours. No results for input(s): AMMONIA in the last 168 hours. Coagulation Profile: No results for input(s): INR, PROTIME in the last 168 hours. Cardiac Enzymes: No results for input(s): CKTOTAL, CKMB, CKMBINDEX, TROPONINI in the last 168 hours. BNP (last 3 results) No results for input(s): PROBNP in the last 8760 hours. HbA1C: No results for input(s): HGBA1C in the last 72 hours. CBG: Recent Labs  Lab 06/22/19 1757 06/22/19 2120 06/23/19 0451 06/23/19 0712 06/23/19 1110  GLUCAP 304* 80 244* 333* 210*   Lipid Profile: No results for input(s): CHOL, HDL, LDLCALC, TRIG, CHOLHDL, LDLDIRECT in the last 72 hours. Thyroid Function Tests: No results for input(s): TSH, T4TOTAL, FREET4, T3FREE, THYROIDAB in the last 72 hours. Anemia Panel: No results for input(s): VITAMINB12, FOLATE, FERRITIN, TIBC, IRON, RETICCTPCT in the last 72 hours. Urine analysis:    Component Value Date/Time   COLORURINE STRAW (A) 06/22/2019 1945   APPEARANCEUR CLEAR 06/22/2019 1945   LABSPEC 1.010 06/22/2019 1945   PHURINE 5.0 06/22/2019 1945   GLUCOSEU >=500 (A) 06/22/2019 1945   HGBUR MODERATE (A) 06/22/2019 1945   BILIRUBINUR NEGATIVE 06/22/2019 1945   KETONESUR NEGATIVE 06/22/2019 1945   PROTEINUR 100 (A) 06/22/2019 1945   UROBILINOGEN 0.2 12/07/2011 1031   NITRITE NEGATIVE 06/22/2019 1945   LEUKOCYTESUR NEGATIVE 06/22/2019 1945   Sepsis Labs: @LABRCNTIP (procalcitonin:4,lacticidven:4) ) Recent Results (from the past 240 hour(s))  SARS CORONAVIRUS 2 (TAT 6-24 HRS) Nasopharyngeal Nasopharyngeal Swab     Status: None   Collection Time: 06/22/19  4:58 PM   Specimen: Nasopharyngeal Swab  Result Value Ref Range Status    SARS Coronavirus 2 NEGATIVE NEGATIVE Final    Comment: (NOTE) SARS-CoV-2 target nucleic acids are NOT DETECTED. The SARS-CoV-2 RNA is generally detectable in upper and lower respiratory specimens during the acute phase of infection. Negative results do not preclude SARS-CoV-2 infection, do not rule out co-infections with other pathogens, and should not be used as the sole basis for treatment or other patient management decisions. Negative results must be combined with clinical observations, patient history, and epidemiological information. The expected result is Negative. Fact Sheet for Patients: SugarRoll.be Fact Sheet for Healthcare Providers: https://www.woods-mathews.com/ This test is not yet approved or cleared by the [place] FDA and  has been authorized for detection and/or diagnosis of SARS-CoV-2 by FDA under an Emergency Use Authorization (EUA). This EUA will remain  in effect (meaning this test can be used) for the duration of the COVID-19 declaration under Section 56 4(b)(1) of the Act, 21 U.S.C. section 360bbb-3(b)(1), unless the authorization is terminated or revoked sooner. Performed at  [facility], [place] [address], [place], [place] [address]          Radiology Studies: Ue Venous Duplex (mc & Wl 7 Am - 7 Pm)  Result Date: 06/22/2019 UPPER VENOUS STUDY  Indications: Edema Comparison Study: no prior Performing Technologist: [name] RVS  Examination Guidelines: A complete evaluation includes B-mode imaging, spectral Doppler, color Doppler, and power Doppler as needed of all accessible portions of each vessel. Bilateral testing is considered an integral part of a complete examination. Limited examinations for reoccurring indications may be performed as noted.  Left Findings: +----------+------------+---------+-----------+----------+-------+  LEFT       Compressible Phasicity Spontaneous Properties Summary   +----------+------------+---------+-----------+----------+-------+  IJV            Full        Yes        Yes                         +----------+------------+---------+-----------+----------+-------+  Subclavian     Full        Yes        Yes                         +----------+------------+---------+-----------+----------+-------+  Axillary       Full        Yes        Yes                         +----------+------------+---------+-----------+----------+-------+  Brachial       Full        Yes        Yes                         +----------+------------+---------+-----------+----------+-------+  Radial         Full                                               +----------+------------+---------+-----------+----------+-------+  Ulnar          Full                                               +----------+------------+---------+-----------+----------+-------+  Cephalic       None                                      forearm  +----------+------------+---------+-----------+----------+-------+  Basilic        Full                                               +----------+------------+---------+-----------+----------+-------+  Summary:  Left: No evidence of deep vein thrombosis in the upper extremity. Findings consistent with age indeterminate superficial vein thrombosis involving the left cephalic vein.  *See table(s) above for measurements and observations.  Diagnosing physician: Deitra Mayo MD Electronically signed by Deitra Mayo MD on 06/22/2019 at 4:24:19 PM.    Final  Ct Extrem Up Entire Arm L W/cm  Result Date: 06/22/2019 CLINICAL DATA:  63 year old female with left upper extremity swelling x1 month. Associated restricted range of motion. EXAM: CT OF THE UPPER LEFT EXTREMITY WITH CONTRAST TECHNIQUE: Multidetector CT imaging of the upper left extremity was performed according to the standard protocol following intravenous contrast administration. CONTRAST:  167mL OMNIPAQUE IOHEXOL 300 MG/ML   SOLN COMPARISON:  None. FINDINGS: Bones/Joint/Cartilage There is no acute fracture or dislocation. There is degenerative changes and fragmentation of the superolateral humeral head likely related to chronic tendinopathy at the insertion of the supraspinatus. No joint effusion. The visualized major arteries and veins appear patent proximal to the elbow. Evaluation of the distal branches is limited. Ligaments Suboptimally assessed by CT. Muscles and Tendons No fluid collection or acute pathology. Soft tissues There is diffuse subcutaneous soft tissue edema most consistent with cellulitis. Clinical correlation is recommended. No drainable fluid collection or abscess. No soft tissue air. Several mildly enlarged left axillary lymph nodes, reactive. IMPRESSION: 1. Diffuse subcutaneous soft tissue edema most consistent with cellulitis. Clinical correlation is recommended. No drainable fluid collection or abscess. 2. No acute fracture or dislocation. Electronically Signed   By: Anner Crete M.D.   On: 06/22/2019 21:25      Scheduled Meds:  amLODipine  5 mg Oral Daily   apixaban  5 mg Oral BID   atorvastatin  10 mg Oral q1800   [START ON 06/24/2019] insulin aspart protamine- aspart  7 Units Subcutaneous Q breakfast   And   insulin aspart protamine- aspart  2 Units Subcutaneous Q1200   And   insulin aspart protamine- aspart  6 Units Subcutaneous Q supper   losartan  25 mg Oral Daily   metoprolol succinate  25 mg Oral Daily   pantoprazole  40 mg Oral Daily   pregabalin  75 mg Oral BID   Continuous Infusions:  cefTRIAXone (ROCEPHIN)  IV Stopped (06/22/19 1809)     LOS: 0 days      Debbe Odea, MD [facility] Pager: [email or web address] Password [email or web address] 06/23/2019, 2:36 PM

## 2019-06-23 NOTE — Progress Notes (Signed)
Patient husband visiting at bedside. Patient comfortable and pain is well managed at this time. Attending consulted regarding expiration of telemetry order.

## 2019-06-23 NOTE — Progress Notes (Signed)
Error in date

## 2019-06-24 DIAGNOSIS — Z9081 Acquired absence of spleen: Secondary | ICD-10-CM

## 2019-06-24 DIAGNOSIS — I480 Paroxysmal atrial fibrillation: Secondary | ICD-10-CM

## 2019-06-24 LAB — GLUCOSE, CAPILLARY
Glucose-Capillary: 288 mg/dL — ABNORMAL HIGH (ref 70–99)
Glucose-Capillary: 293 mg/dL — ABNORMAL HIGH (ref 70–99)
Glucose-Capillary: 347 mg/dL — ABNORMAL HIGH (ref 70–99)

## 2019-06-24 MED ORDER — HYDROCODONE-ACETAMINOPHEN 5-325 MG PO TABS: 1.0000 | ORAL_TABLET | Freq: Four times a day (QID) | ORAL | 0 refills | Status: DC | PRN

## 2019-06-24 MED FILL — HYDROCODON-APAP 5-325: 5-325 | 4 days supply | Qty: 15 | Fill #0

## 2019-06-24 NOTE — Discharge Summary (Addendum)
Physician Discharge Summary  [name] XFG:[id number] DOB: 1955-12-08 DOA: 06/22/2019  PCP: Leeroy Cha, MD  Admit date: 06/22/2019 Discharge date: 06/24/2019  Admitted From: Home Disposition: Home   Discharge Condition: Stable CODE STATUS: Full code Diet recommendation: Heart healthy and diabetic Consultations:  None   Discharge Diagnoses:  Principal Problem:   Cellulitis of right arm Active Problems:   Thrombophlebitis of left arm   H/O splenectomy   Essential hypertension   Mixed hyperlipidemia   PAF (paroxysmal atrial fibrillation) ([place])   DM (diabetes mellitus), type 2 ([place])    Brief Summary: [name] is a 63 y.o.femalewith medical history significant ofhypertension, mixed hyperlipidemia, type 2 diabetes mellitus, paroxysmal atrial fibrillation on Eliquis, GERD, gout, splenectomy,, history of cervical cancer and recent anterior mediastinal mass (found to be benign on PET scan) presented to ED with a complaint of left upper extremity pain and swelling. She was diagnosed with cellulitis and was admitted for treatment as she had failed 2 weeks of Keflex.  Left arm venous duplex revealed superficial thrombosis in the left cephalic vein.  Hospital Course:  Principal Problem:   Cellulitis of right upper arm -Due to ongoing pain, I changed tramadol to hydrocodone which she states has helped more than tramadol -Cellulitis has improved enough to be transitioned to oral medication - She will receive a total of 3 doses of IV ceftriaxone in the hospital and continue her cephalexin of which she has 4-1/2 days left   Active Problems:   Thrombophlebitis of right arm ?  If this was an infected thrombus related to above -Tender cord noted below left antecubital fossa with surrounding edema which has improved with elevation - - Continue ice packs to the swollen area and continue to elevate arm as much as possible-swelling and pain are improving    Essential  hypertension  -Continue amlodipine, losartan, Toprol     Mixed hyperlipidemia -Continue Lipitor    PAF (paroxysmal atrial fibrillation)  -Continue Eliquis and Toprol    DM (diabetes mellitus), type 2 ([facility]) -Resumed 70/30 at the doses that she is taking at home-sugars are slightly elevated on these doses but she states that Dr. Buddy Duty will adjust her medications when she follows up with him in 2 weeks    H/O splenectomy and thrombocytosis    Discharge Exam: Vitals:   06/23/19 2331 06/24/19 0422  BP: (!) 121/93 137/73  Pulse: 76 74  Resp: 18 16  Temp: 98.3 F (36.8 C) 97.6 F (36.4 C)  SpO2: 97% 97%   Vitals:   06/23/19 1819 06/23/19 2000 06/23/19 2331 06/24/19 0422  BP: (!) 145/76 (!) 150/83 (!) 121/93 137/73  Pulse: 74 74 76 74  Resp: _0 Temp: 98.1 F (36.7 C) 98.6 F (37 C) 98.3 F (36.8 C) 97.6 F (36.4 C)  TempSrc: Oral Oral Oral Oral  SpO2: 96% 95% 97% 97%    General: Pt is alert, awake, not in acute distress Cardiovascular: RRR, S1/S2 +, no rubs, no gallops Respiratory: CTA bilaterally, no wheezing, no rhonchi Abdominal: Soft, NT, ND, bowel sounds + Extremities: no edema, no cyanosis   Discharge Instructions  Discharge Instructions    Diet - low sodium heart healthy   Complete by: As directed    Increase activity slowly   Complete by: As directed      Allergies as of 06/24/2019      Reactions   Gabapentin Other (See Comments)   Caused upset stomach   Penicillins Hives, Other (  See Comments)   Has patient had a PCN reaction causing immediate rash, facial/tongue/throat swelling, SOB or lightheadedness with hypotension: Unknown Has patient had a PCN reaction causing severe rash involving mucus membranes or skin necrosis: Unknown Has patient had a PCN reaction that required hospitalization: Unknown Has patient had a PCN reaction occurring within the last 10 years: No If all of the above answers are "NO", then may proceed with Cephalosporin  use.   Pepcid [famotidine] Other (See Comments)   HEADACHE      Medication List    STOP taking these medications   traMADol 50 MG tablet Commonly known as: ULTRAM     TAKE these medications   amLODipine 5 MG tablet Commonly known as: NORVASC Take 5 mg by mouth daily.   apixaban 5 MG Tabs tablet Commonly known as: ELIQUIS Take 1 tablet (5 mg total) by mouth 2 (two) times daily.   atorvastatin 10 MG tablet Commonly known as: LIPITOR Take 10 mg by mouth daily at 6 PM.   cephALEXin 500 MG capsule Commonly known as: KEFLEX Take 500 mg by mouth 4 (four) times daily.   clonazePAM 1 MG tablet Commonly known as: KLONOPIN Take 0.5 mg by mouth daily as needed for anxiety.   glucose blood test strip Commonly known as: OneTouch Verio CHECK BLOOD SUGAR 3 TIMES DAILY-DX-E11.9 AND Z79.4.   HYDROcodone-acetaminophen 5-325 MG tablet Commonly known as: NORCO/VICODIN Take 1 tablet by mouth every 6 (six) hours as needed for moderate pain.   insulin NPH-regular Human (70-30) 100 UNIT/ML injection Inject 5-6 Units into the skin See admin instructions. Take 6 units in the morning, and 5 units at night. Hold insulin if blood sugar is less than 99 mg.  Decrease to 3 units if blood sugar is 100 to 140 mg. What changed: additional instructions   losartan 25 MG tablet Commonly known as: COZAAR Take 1 tablet (25 mg total) by mouth daily.   metoprolol succinate 25 MG 24 hr tablet Commonly known as: TOPROL-XL Take 1 tablet (25 mg total) by mouth daily.   Nasacort Allergy 24HR 55 MCG/ACT Aero nasal inhaler Generic drug: triamcinolone Place 2 sprays into the nose daily as needed.   omeprazole 20 MG capsule Commonly known as: PRILOSEC Take 20 mg by mouth daily.   OneTouch Delica Lancets 10G Misc Check blood sugar 3 times daily-DX-E11.9 and Z79.4.   OneTouch Verio w/Device Kit 1 kit by Does not apply route 3 (three) times daily.   pregabalin 75 MG capsule Commonly known as:  LYRICA TAKE 1 CAPSULE BY MOUTH TWICE A DAY What changed: when to take this   Systane Complete 0.6 % Soln Generic drug: Propylene Glycol Place 1 drop into both eyes 2 (two) times daily as needed (for dry eyes).   Vitamin D-3 125 MCG (5000 UT) Tabs Take 5,000 tablets by mouth every evening.       Allergies  Allergen Reactions  . Gabapentin Other (See Comments)    Caused upset stomach  . Penicillins Hives and Other (See Comments)    Has patient had a PCN reaction causing immediate rash, facial/tongue/throat swelling, SOB or lightheadedness with hypotension: Unknown Has patient had a PCN reaction causing severe rash involving mucus membranes or skin necrosis: Unknown Has patient had a PCN reaction that required hospitalization: Unknown Has patient had a PCN reaction occurring within the last 10 years: No If all of the above answers are "NO", then may proceed with Cephalosporin use.   Marland Kitchen Pepcid [Famotidine] Other (See  Comments)    HEADACHE     Procedures/Studies:   Dg Chest 1 View  Result Date: 05/29/2019 CLINICAL DATA:  Shortness of breath.  Pleural effusion. EXAM: CHEST  1 VIEW COMPARISON:  Single-view of the chest 05/29/2019. CT chest 05/28/2019. FINDINGS: Small bilateral pleural effusions and basilar airspace disease are worse on the right and unchanged. No pneumothorax. Heart size is normal. IMPRESSION: No change in small bilateral pleural effusions and airspace disease, worse on the right. Electronically Signed   By: Inge Rise M.D.   On: 05/29/2019 10:41   Ct Angio Chest Pe W And/or Wo Contrast  Result Date: 05/28/2019 CLINICAL DATA:  Short of breath, nausea. EXAM: CT ANGIOGRAPHY CHEST WITH CONTRAST TECHNIQUE: Multidetector CT imaging of the chest was performed using the standard protocol during bolus administration of intravenous contrast. Multiplanar CT image reconstructions and MIPs were obtained to evaluate the vascular anatomy. CONTRAST:  129m OMNIPAQUE IOHEXOL 350  MG/ML SOLN COMPARISON:  Neck CT 04/02/2018 FINDINGS: Cardiovascular: No filling defects within the pulmonary arteries to suggest acute pulmonary embolism. No acute findings of the aorta or great vessels. No pericardial fluid. Mediastinum/Nodes: No axillary lymphadenopathy. There is ill-defined soft tissue thickening in the anterior mediastinum measuring 2.5 cm in thickness (image 39/5)a. This ill-defined tissue extends into the LEFT supraclavicular space (image 22/5. The tissue planes about the esophagus or poorly defined particularly inferior to the carina. No pericardial effusion Lungs/Pleura: There is a moderate effusion on the RIGHT and small effusion on the LEFT. There is interlobular septal thickening in lower lobes. Motion degradation of the imaging. No clear nodularity. Upper Abdomen: Limited view of the liver, kidneys, pancreas are unremarkable. Normal adrenal glands. Musculoskeletal: No aggressive osseous lesion. Review of the MIP images confirms the above findings. IMPRESSION: 1. Ill-defined soft tissue thickening in the anterior mediastinum extending into the LEFT supraclavicular clavicular nodal station. Concern for lymphoproliferative process or thymic origin neoplasm. Findings are new from comparison neck CT 2019. Consider FDG PET scan to evaluate malignant potential. LEFT supraclavicular tissue may be amenable to biopsy. 2. Bilateral pleural effusions RIGHT greater than LEFT. RIGHT effusion is moderate. 3. Mild interstitial edema. 4. No evidence of acute pulmonary embolism. Electronically Signed   By: SSuzy BouchardM.D.   On: 05/28/2019 10:57   UKoreaAbdomen Complete  Result Date: 05/28/2019 CLINICAL DATA:  Elevated LFTs EXAM: ABDOMEN ULTRASOUND COMPLETE COMPARISON:  04/02/2018 FINDINGS: Gallbladder: Surgically removed Common bile duct: Diameter: 7.5 mm. Liver: No focal lesion identified. Within normal limits in parenchymal echogenicity. Portal vein is patent on color Doppler imaging with normal  direction of blood flow towards the liver. IVC: No abnormality visualized. Pancreas: Visualized portion unremarkable. Spleen: Surgically removed Right Kidney: Length: 11.6 cm. Echogenicity within normal limits. No mass or hydronephrosis visualized. Left Kidney: Length: 11.2 cm. Echogenicity within normal limits. No mass or hydronephrosis visualized. Abdominal aorta: No aneurysm visualized. Other findings: Note is made of a right-sided pleural effusion. IMPRESSION: Postsurgical changes. No acute abnormality noted. Electronically Signed   By: MInez CatalinaM.D.   On: 05/28/2019 14:01   Nm Pet Image Initial (pi) Skull Base To Thigh  Result Date: 06/09/2019 CLINICAL DATA:  Initial treatment strategy for anterior mediastinal soft tissue on recent chest CT angiogram. Remote history of cervical cancer status post hysterectomy. 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 EXAM: NUCLEAR MEDICINE PET SKULL BASE TO THIGH TECHNIQUE: 6.8 mCi F-18 FDG was injected intravenously. Full-ring PET imaging was performed from the skull base to thigh after the radiotracer.  CT data was obtained and used for attenuation correction and anatomic localization. Fasting blood glucose: 233 mg/dl COMPARISON:  05/28/2019 chest CT angiogram. 11/02/2018 CT abdomen/pelvis. FINDINGS: Mediastinal blood pool activity: SUV max 2.6 Liver activity: SUV max NA NECK: No hypermetabolic lymph nodes in the neck. Incidental CT findings: Subcentimeter left thyroid lobe calcification. CHEST: No enlarged or hypermetabolic axillary, mediastinal or hilar lymph nodes. Mildly enlarged 1.2 cm left prevascular lymph node is stable since 05/28/2019 chest CT angiogram study and is non hypermetabolic. Otherwise no discrete mass or hypermetabolism in anterior mediastinum or supraclavicular regions. No hypermetabolic pulmonary findings. Incidental CT findings: Small dependent left pleural effusion. No acute consolidative airspace disease, lung masses or  significant pulmonary nodules. ABDOMEN/PELVIS: No abnormal hypermetabolic activity within the liver, pancreas, adrenal glands, or spleen. No hypermetabolic lymph nodes in the abdomen or pelvis. Nonspecific focus of mild hypermetabolism along the posterior margin of the ascending colon without CT correlate. Incidental CT findings: Cholecystectomy. Atherosclerotic nonaneurysmal abdominal aorta. Status post splenectomy with a few splenules in the splenectomy bed. Status post resection of the pancreatic body and tail. Hysterectomy. Moderate colonic stool volume. SKELETON: No focal hypermetabolic activity to suggest skeletal metastasis. Incidental CT findings: none IMPRESSION: 1. No evidence of a hypermetabolic mass in the anterior mediastinum to suggest a neoplastic process. Mildly enlarged left prevascular lymph node is stable since 05/28/2019 chest CT angiogram study and is non hypermetabolic, compatible with reactive node. 2. Nonspecific focus of mild hypermetabolism along the posterior margin of the ascending colon, without CT correlate, potentially physiologic. Correlation with the patient's colon cancer screening history is recommended. If screening is not up-to-date, appropriate screening should be considered. 3. Small dependent left pleural effusion. 4.  Aortic Atherosclerosis (ICD10-I70.0). Electronically Signed   By: Ilona Sorrel M.D.   On: 06/09/2019 12:28   Dg Chest Port 1 View  Result Date: 05/29/2019 CLINICAL DATA:  Patient admitted to the hospital yesterday for shortness of breath. EXAM: PORTABLE CHEST 1 VIEW COMPARISON:  PA and lateral chest 05/15/2019. Single view of the chest and CT chest 05/28/2019 FINDINGS: Small bilateral pleural effusions, greater on the right, have increased since yesterday's exam. There is associated compressive basilar atelectasis. Heart size is normal. Aortic atherosclerosis. No pneumothorax. No acute or focal bony abnormality. IMPRESSION: Increased small bilateral pleural  effusions and basilar atelectasis, worse on the right. Atherosclerosis. Electronically Signed   By: Inge Rise M.D.   On: 05/29/2019 10:12   Dg Chest Port 1 View  Result Date: 05/28/2019 CLINICAL DATA:  Chest pain and dyspnea EXAM: PORTABLE CHEST 1 VIEW COMPARISON:  05/15/2019 FINDINGS: Cardiac shadows within normal limits. Small right pleural effusion is noted. Mild bibasilar atelectasis is seen. No bony abnormality is noted. IMPRESSION: Right pleural effusion. Electronically Signed   By: Inez Catalina M.D.   On: 05/28/2019 07:46   Mm 3d Screen Breast Bilateral  Result Date: 06/04/2019 CLINICAL DATA:  Screening. EXAM: DIGITAL SCREENING BILATERAL MAMMOGRAM WITH TOMO AND CAD COMPARISON:  Previous exam(s). ACR Breast Density Category b: There are scattered areas of fibroglandular density. FINDINGS: There are no findings suspicious for malignancy. Images were processed with CAD. IMPRESSION: No mammographic evidence of malignancy. A result letter of this screening mammogram will be mailed directly to the patient. RECOMMENDATION: Screening mammogram in one year. (Code:[id number]-B-01Y) BI-RADS CATEGORY  1: Negative. Electronically Signed   By: Audie Pinto M.D.   On: 06/04/2019 12:13   Ue Venous Duplex (mc & Wl 7 Am - 7 Pm)  Result Date: 06/22/2019 UPPER  VENOUS STUDY  Indications: Edema Comparison Study: no prior Performing Technologist: [name] RVS  Examination Guidelines: A complete evaluation includes B-mode imaging, spectral Doppler, color Doppler, and power Doppler as needed of all accessible portions of each vessel. Bilateral testing is considered an integral part of a complete examination. Limited examinations for reoccurring indications may be performed as noted.  Left Findings: +----------+------------+---------+-----------+----------+-------+ LEFT      CompressiblePhasicitySpontaneousPropertiesSummary +----------+------------+---------+-----------+----------+-------+ IJV            Full       Yes       Yes                      +----------+------------+---------+-----------+----------+-------+ Subclavian    Full       Yes       Yes                      +----------+------------+---------+-----------+----------+-------+ Axillary      Full       Yes       Yes                      +----------+------------+---------+-----------+----------+-------+ Brachial      Full       Yes       Yes                      +----------+------------+---------+-----------+----------+-------+ Radial        Full                                          +----------+------------+---------+-----------+----------+-------+ Ulnar         Full                                          +----------+------------+---------+-----------+----------+-------+ Cephalic      None                                  forearm +----------+------------+---------+-----------+----------+-------+ Basilic       Full                                          +----------+------------+---------+-----------+----------+-------+  Summary:  Left: No evidence of deep vein thrombosis in the upper extremity. Findings consistent with age indeterminate superficial vein thrombosis involving the left cephalic vein.  *See table(s) above for measurements and observations.  Diagnosing physician: Deitra Mayo MD Electronically signed by Deitra Mayo MD on 06/22/2019 at 4:24:19 PM.    Final    Ct Extrem Up Entire Arm L W/cm  Result Date: 06/22/2019 CLINICAL DATA:  63 year old female with left upper extremity swelling x1 month. Associated restricted range of motion. EXAM: CT OF THE UPPER LEFT EXTREMITY WITH CONTRAST TECHNIQUE: Multidetector CT imaging of the upper left extremity was performed according to the standard protocol following intravenous contrast administration. CONTRAST:  180m OMNIPAQUE IOHEXOL 300 MG/ML  SOLN COMPARISON:  None. FINDINGS: Bones/Joint/Cartilage There is no acute fracture or  dislocation. There is degenerative changes and fragmentation of the superolateral humeral head likely related to chronic tendinopathy at the insertion  of the supraspinatus. No joint effusion. The visualized major arteries and veins appear patent proximal to the elbow. Evaluation of the distal branches is limited. Ligaments Suboptimally assessed by CT. Muscles and Tendons No fluid collection or acute pathology. Soft tissues There is diffuse subcutaneous soft tissue edema most consistent with cellulitis. Clinical correlation is recommended. No drainable fluid collection or abscess. No soft tissue air. Several mildly enlarged left axillary lymph nodes, reactive. IMPRESSION: 1. Diffuse subcutaneous soft tissue edema most consistent with cellulitis. Clinical correlation is recommended. No drainable fluid collection or abscess. 2. No acute fracture or dislocation. Electronically Signed   By: Anner Crete M.D.   On: 06/22/2019 21:25   US Thoracentesis Asp Pleural Space W/img Guide  Result Date: 05/29/2019 INDICATION: Symptomatic right sided pleural effusion EXAM: US THORACENTESIS ASP PLEURAL SPACE W/IMG GUIDE COMPARISON:  None. MEDICATIONS: 10 cc 1% lidocaine COMPLICATIONS: None immediate. TECHNIQUE: Informed written consent was obtained from the patient after a discussion of the risks, benefits and alternatives to treatment. A timeout was performed prior to the initiation of the procedure. Initial ultrasound scanning demonstrates a right pleural effusion. The lower chest was prepped and draped in the usual sterile fashion. 1% lidocaine was used for local anesthesia. Under direct ultrasound guidance, a 19 gauge, 7-cm, Yueh catheter was introduced. An ultrasound image was saved for documentation purposes. The thoracentesis was performed. The catheter was removed and a dressing was applied. The patient tolerated the procedure well without immediate post procedural complication. The patient was escorted to have an  upright chest radiograph. FINDINGS: A total of approximately 280 cc of thick bloody fluid was removed. Requested samples were sent to the laboratory. IMPRESSION: Successful ultrasound-guided right sided thoracentesis yielding 280 cc of pleural fluid. Read by Lavonia Drafts [facility] Electronically Signed   By: Markus Daft M.D.   On: 05/29/2019 10:24      The results of significant diagnostics from this hospitalization (including imaging, microbiology, ancillary and laboratory) are listed below for reference.     Microbiology: Recent Results (from the past 240 hour(s))  SARS CORONAVIRUS 2 (TAT 6-24 HRS) Nasopharyngeal Nasopharyngeal Swab     Status: None   Collection Time: 06/22/19  4:58 PM   Specimen: Nasopharyngeal Swab  Result Value Ref Range Status   SARS Coronavirus 2 NEGATIVE NEGATIVE Final    Comment: (NOTE) SARS-CoV-2 target nucleic acids are NOT DETECTED. The SARS-CoV-2 RNA is generally detectable in upper and lower respiratory specimens during the acute phase of infection. Negative results do not preclude SARS-CoV-2 infection, do not rule out co-infections with other pathogens, and should not be used as the sole basis for treatment or other patient management decisions. Negative results must be combined with clinical observations, patient history, and epidemiological information. The expected result is Negative. Fact Sheet for Patients: SugarRoll.be Fact Sheet for Healthcare Providers: https://www.woods-mathews.com/ This test is not yet approved or cleared by the [place] FDA and  has been authorized for detection and/or diagnosis of SARS-CoV-2 by FDA under an Emergency Use Authorization (EUA). This EUA will remain  in effect (meaning this test can be used) for the duration of the COVID-19 declaration under Section 56 4(b)(1) of the Act, 21 U.S.C. section 360bbb-3(b)(1), unless the authorization is terminated or revoked  sooner. Performed at [facility], [place] [address], [place], [place] [address]      Labs: BNP (last 3 results) Recent Labs    05/28/19 1258  BNP 814.4*   Basic Metabolic Panel: Recent Labs  Lab 06/22/19 1136 06/23/19 0242  NA 132* 133*  K 4.9 4.7  CL 93* 98  CO2 27 27  GLUCOSE 426* 227*  BUN 23 21  CREATININE 1.11* 0.94  CALCIUM 9.1 8.7*   Liver Function Tests: Recent Labs  Lab 06/22/19 1136 06/23/19 0242  AST 30 25  ALT 27 21  ALKPHOS 370* 311*  BILITOT 0.3 0.5  PROT 6.6 6.0*  ALBUMIN 2.7* 2.5*   No results for input(s): LIPASE, AMYLASE in the last 168 hours. No results for input(s): AMMONIA in the last 168 hours. CBC: Recent Labs  Lab 06/22/19 1136 06/23/19 0242  WBC 9.4 10.9*  NEUTROABS 5.5  --   HGB 9.2* 8.7*  HCT 30.3* 28.7*  MCV 83.0 82.7  PLT 539* 528*   Cardiac Enzymes: No results for input(s): CKTOTAL, CKMB, CKMBINDEX, TROPONINI in the last 168 hours. BNP: Invalid input(s): POCBNP CBG: Recent Labs  Lab 06/23/19 0712 06/23/19 1110 06/23/19 1618 06/23/19 2036 06/24/19 0615  GLUCAP 333* 210* 276* 339* 288*   D-Dimer No results for input(s): DDIMER in the last 72 hours. Hgb A1c No results for input(s): HGBA1C in the last 72 hours. Lipid Profile No results for input(s): CHOL, HDL, LDLCALC, TRIG, CHOLHDL, LDLDIRECT in the last 72 hours. Thyroid function studies No results for input(s): TSH, T4TOTAL, T3FREE, THYROIDAB in the last 72 hours.  Invalid input(s): FREET3 Anemia work up No results for input(s): VITAMINB12, FOLATE, FERRITIN, TIBC, IRON, RETICCTPCT in the last 72 hours. Urinalysis    Component Value Date/Time   COLORURINE STRAW (A) 06/22/2019 1945   APPEARANCEUR CLEAR 06/22/2019 1945   LABSPEC 1.010 06/22/2019 1945   PHURINE 5.0 06/22/2019 1945   GLUCOSEU >=500 (A) 06/22/2019 1945   HGBUR MODERATE (A) 06/22/2019 1945   BILIRUBINUR NEGATIVE 06/22/2019 1945   KETONESUR NEGATIVE 06/22/2019 1945   PROTEINUR 100 (A)  06/22/2019 1945   UROBILINOGEN 0.2 12/07/2011 1031   NITRITE NEGATIVE 06/22/2019 1945   LEUKOCYTESUR NEGATIVE 06/22/2019 1945   Sepsis Labs Invalid input(s): PROCALCITONIN,  WBC,  LACTICIDVEN Microbiology Recent Results (from the past 240 hour(s))  SARS CORONAVIRUS 2 (TAT 6-24 HRS) Nasopharyngeal Nasopharyngeal Swab     Status: None   Collection Time: 06/22/19  4:58 PM   Specimen: Nasopharyngeal Swab  Result Value Ref Range Status   SARS Coronavirus 2 NEGATIVE NEGATIVE Final    Comment: (NOTE) SARS-CoV-2 target nucleic acids are NOT DETECTED. The SARS-CoV-2 RNA is generally detectable in upper and lower respiratory specimens during the acute phase of infection. Negative results do not preclude SARS-CoV-2 infection, do not rule out co-infections with other pathogens, and should not be used as the sole basis for treatment or other patient management decisions. Negative results must be combined with clinical observations, patient history, and epidemiological information. The expected result is Negative. Fact Sheet for Patients: SugarRoll.be Fact Sheet for Healthcare Providers: https://www.woods-mathews.com/ This test is not yet approved or cleared by the [place] FDA and  has been authorized for detection and/or diagnosis of SARS-CoV-2 by FDA under an Emergency Use Authorization (EUA). This EUA will remain  in effect (meaning this test can be used) for the duration of the COVID-19 declaration under Section 56 4(b)(1) of the Act, 21 U.S.C. section 360bbb-3(b)(1), unless the authorization is terminated or revoked sooner. Performed at [facility], [place] [address], [place], [place] [address]      Time coordinating discharge in minutes: 65  SIGNED:   Debbe Odea, MD  [facility] 06/24/2019, 10:04 AM Pager  If 7PM-7AM, please contact night-coverage [email or web address] Password [email or web address]

## 2019-06-24 NOTE — Discharge Instructions (Signed)

## 2019-06-24 NOTE — Progress Notes (Signed)
Inpatient Diabetes Program Recommendations  AACE/ADA: New Consensus Statement on Inpatient Glycemic Control (2015)  Target Ranges:  Prepandial:   less than 140 mg/dL      Peak postprandial:   less than 180 mg/dL (1-2 hours)      Critically ill patients:  140 - 180 mg/dL   Lab Results  Component Value Date   GLUCAP 288 (H) 06/24/2019   HGBA1C 9.0 (H) 05/28/2019    Review of Glycemic Control Results for DESTENEE, JENTZSCH (MRN [id number]) as of 06/24/2019 09:42  Ref. Range 06/23/2019 07:12 06/23/2019 11:10 06/23/2019 16:18 06/23/2019 20:36 06/24/2019 06:15  Glucose-Capillary Latest Ref Range: 70 - 99 mg/dL 333 (H)  Novolog 10 units 210 (H)     70/30 2 units 276 (H)     70/30 6 units 339 (H) 288 (H)     70/30 7 units   Diabetes history: DM 2, Sees Dr. Buddy Duty Outpatient Diabetes medications:  Current orders for Inpatient glycemic control:  70/30  7 units breakfast 70/30  2 units lunch 70/30  6 units supper   A1c 9% on 05/28/19  Inpatient Diabetes Program Recommendations:    Noted home 70/30 started yesterday. Glucose trends still elevated. Pt will need adjustment outpatient with Endocrinologist  Consider increasing 70/30 to:  70/30 4 units lunch 70/30 8 units supper  May need to increase morning 70/30 dose if fasting glucose is elevated.  Thanks,  Tama Headings RN, MSN, BC-ADM Inpatient Diabetes Coordinator Team Pager [phone] (8a-5p)

## 2019-07-14 LAB — ACID FAST CULTURE WITH REFLEXED SENSITIVITIES (MYCOBACTERIA): Acid Fast Culture: NEGATIVE

## 2019-07-26 ENCOUNTER — Encounter (INDEPENDENT_AMBULATORY_CARE_PROVIDER_SITE_OTHER): Payer: Medicare Other | Admitting: Ophthalmology

## 2019-07-26 DIAGNOSIS — H43813 Vitreous degeneration, bilateral: Secondary | ICD-10-CM | POA: Diagnosis not present

## 2019-07-26 DIAGNOSIS — E113393 Type 2 diabetes mellitus with moderate nonproliferative diabetic retinopathy without macular edema, bilateral: Secondary | ICD-10-CM | POA: Diagnosis not present

## 2019-07-26 DIAGNOSIS — E11311 Type 2 diabetes mellitus with unspecified diabetic retinopathy with macular edema: Secondary | ICD-10-CM | POA: Diagnosis not present

## 2019-08-04 ENCOUNTER — Telehealth: Payer: Self-pay | Admitting: *Deleted

## 2019-08-04 ENCOUNTER — Other Ambulatory Visit: Payer: Self-pay | Admitting: Podiatry

## 2019-08-04 NOTE — Telephone Encounter (Signed)
Lyrica 75mg  #30 one capsule at bedtime called to [id number], with note that pt will need an appt prior to future refills.

## 2019-08-12 ENCOUNTER — Ambulatory Visit (INDEPENDENT_AMBULATORY_CARE_PROVIDER_SITE_OTHER): Payer: Medicare Other

## 2019-08-12 ENCOUNTER — Other Ambulatory Visit: Payer: Self-pay

## 2019-08-12 ENCOUNTER — Encounter: Payer: Self-pay | Admitting: Adult Health

## 2019-08-12 ENCOUNTER — Ambulatory Visit (INDEPENDENT_AMBULATORY_CARE_PROVIDER_SITE_OTHER): Payer: Medicare Other | Admitting: Adult Health

## 2019-08-12 VITALS — BP 132/74 | HR 74 | Temp 97.8°F | Ht 60.0 in | Wt 127.0 lb

## 2019-08-12 DIAGNOSIS — J9859 Other diseases of mediastinum, not elsewhere classified: Secondary | ICD-10-CM

## 2019-08-12 DIAGNOSIS — L03113 Cellulitis of right upper limb: Secondary | ICD-10-CM

## 2019-08-12 DIAGNOSIS — J90 Pleural effusion, not elsewhere classified: Secondary | ICD-10-CM | POA: Diagnosis not present

## 2019-08-12 NOTE — Patient Instructions (Signed)
Mucinex Twice daily  As needed  Cough/congesteion  Delsym 2 tsp Twice daily  As needed  Cough  Continue on Incentive Spirometry As needed   Follow up with Dr. Elsworth Soho  Or Lalisa Kiehn NP in 4  months and As needed.  Please contact office for sooner follow up if symptoms do not improve or worsen or seek emergency care

## 2019-08-12 NOTE — Progress Notes (Signed)
$'@Patient'C$  ID: [name], female    DOB: [birth year], 63 y.o.   MRN: [id number]  Chief Complaint  Patient presents with  . Follow-up    Referring provider: Leeroy Cha  HPI: 63 year old female seen for pulmonary consult during hospitalization May 28, 2019 for mediastinal mass Medical history significant for diabetes. History of cervical cancer at age 46 status post hysterectomy She has a history of splenectomy, cholecystectomy  and pancreatic tail removal in the past from severe food poisoning/botulism -1999-2000.   She has known chronic lymphocytosis and inguinal lymphadenopathy biopsy done September 2019 came back benign.     TEST/EVENTS :  CT chest 05/28/2019 showed ill-defined soft tissue thickening in the anterior mediastinum extending into the left supraclavicular nodal station.  Concern for lymphoproliferative process or thymic origin neoplasm.  Bilateral pleural effusions right greater than left  Underwent a right-sided thoracentesis with 280 cc of fluid removed appeared to be transudate of.  Fluid culture growth was negative.  Cytology was negative.   2D echo August 2020 showed a preserved EF.    PET scan showed no enlarged or hypermetabolic axillary mediastinal or hilar lymph nodes.  Mildly enlarged 1.2 cm left prevascular lymph node is stable since September 2020.  Otherwise no discrete mass or hyper metabolism in the anterior mediastinum or supraclavicular regions.  Small dependent left pleural effusion noted.  Abdomen with no abnormal hypermetabolic activity within.  Nonspecific focus of mild hypermetabolism along the posterior margin of the ascending colon.  08/12/2019 follow-up: Mediastinal mass and pleural effusion As above patient was seen in September for mediastinal mass during hospitalization.  She was treated with antibiotics.  Underwent a thoracentesis fluid cultures were negative cytology was negative.  A follow-up PET scan showed no mediastinal  mass or hypermetabolic activity.  A small residual left pleural effusion was remaining.  Patient says since last visit she is doing well.  She is feeling much better.  Starting to exercise.  Activity level is improved.  She denies any shortness of breath.  Today chest x-ray shows clear lungs with no effusion.  No adenopathy. We discussed her test results.  Since last visit patient did get readmitted for a cellulitis right arm and superficial thrombus of the left cephalic vein.  Says she is doing well since discharge..  Allergies  Allergen Reactions  . Gabapentin Other (See Comments)    Caused upset stomach  . Penicillins Hives and Other (See Comments)    Has patient had a PCN reaction causing immediate rash, facial/tongue/throat swelling, SOB or lightheadedness with hypotension: Unknown Has patient had a PCN reaction causing severe rash involving mucus membranes or skin necrosis: Unknown Has patient had a PCN reaction that required hospitalization: Unknown Has patient had a PCN reaction occurring within the last 10 years: No If all of the above answers are "NO", then may proceed with Cephalosporin use.   Marland Kitchen Pepcid [Famotidine] Other (See Comments)    HEADACHE    Immunization History  Administered Date(s) Administered  . Influenza Split 06/20/2014, 07/11/2015, 06/23/2018  . Influenza Whole 06/17/2013  . Influenza, High Dose Seasonal PF 05/11/2019  . PPD Test 01/18/2015, 04/30/2016  . Pneumococcal Conjugate-13 05/19/2015, 05/11/2019  . Pneumococcal-Unspecified 09/23/2009  . Tdap 09/23/2006    Past Medical History:  Diagnosis Date  . Cancer ([place]) 1977   CERVICAL  . GERD (gastroesophageal reflux disease) 11/14/2013  . Gout   . Mixed hyperlipidemia 11/14/2013    Tobacco History: Social History   Tobacco Use  Smoking Status  Former Smoker  . Quit date: 09/23/1989  . Years since quitting: 29.9  Smokeless Tobacco Never Used   Counseling given: Not Answered   Outpatient  Medications Prior to Visit  Medication Sig Dispense Refill  . amLODipine (NORVASC) 5 MG tablet Take 5 mg by mouth daily.    Marland Kitchen apixaban (ELIQUIS) 5 MG TABS tablet Take 1 tablet (5 mg total) by mouth 2 (two) times daily. 60 tablet 3  . atorvastatin (LIPITOR) 10 MG tablet Take 10 mg by mouth daily at 6 PM.     . Blood Glucose Monitoring Suppl (ONETOUCH VERIO) w/Device KIT 1 kit by Does not apply route 3 (three) times daily. 1 kit 0  . Cholecalciferol (VITAMIN D-3) 5000 units TABS Take 5,000 tablets by mouth every evening.    . clonazePAM (KLONOPIN) 1 MG tablet Take 0.5 mg by mouth daily as needed for anxiety.   1  . glucose blood (ONETOUCH VERIO) test strip CHECK BLOOD SUGAR 3 TIMES DAILY-DX-E11.9 AND Z79.4. 300 each 1  . insulin NPH-regular Human (70-30) 100 UNIT/ML injection Inject 5-6 Units into the skin See admin instructions. Take 6 units in the morning, and 5 units at night. Hold insulin if blood sugar is less than 99 mg.  Decrease to 3 units if blood sugar is 100 to 140 mg. (Patient taking differently: Inject 5-6 Units into the skin See admin instructions. Take 7 units in the morning, 2 units at lunch and 6 units in the evening. Hold insulin if blood sugar is less than 99 mg.  Decrease to 3 units if blood sugar is 100 to 140 mg.) 10 mL 11  . losartan (COZAAR) 25 MG tablet Take 1 tablet (25 mg total) by mouth daily. 30 tablet 3  . metoprolol succinate (TOPROL-XL) 25 MG 24 hr tablet Take 1 tablet (25 mg total) by mouth daily. 30 tablet 2  . omeprazole (PRILOSEC) 20 MG capsule Take 20 mg by mouth daily.    Glory Rosebush DELICA LANCETS 94R MISC Check blood sugar 3 times daily-DX-E11.9 and Z79.4. 300 each 1  . pregabalin (LYRICA) 75 MG capsule Take 1 capsule (75 mg total) by mouth at bedtime. 30 capsule 0  . Propylene Glycol (SYSTANE COMPLETE) 0.6 % SOLN Place 1 drop into both eyes 2 (two) times daily as needed (for dry eyes).    . triamcinolone (NASACORT ALLERGY 24HR) 55 MCG/ACT AERO nasal inhaler  Place 2 sprays into the nose daily as needed.    Marland Kitchen HYDROcodone-acetaminophen (NORCO/VICODIN) 5-325 MG tablet Take 1 tablet by mouth every 6 (six) hours as needed for moderate pain. (Patient not taking: Reported on 08/12/2019) 15 tablet 0   No facility-administered medications prior to visit.      Review of Systems:   Constitutional:   No  weight loss, night sweats,  Fevers, chills, fatigue, or  lassitude.  HEENT:   No headaches,  Difficulty swallowing,  Tooth/dental problems, or  Sore throat,                No sneezing, itching, ear ache, nasal congestion, post nasal drip,   CV:  No chest pain,  Orthopnea, PND, swelling in lower extremities, anasarca, dizziness, palpitations, syncope.   GI  No heartburn, indigestion, abdominal pain, nausea, vomiting, diarrhea, change in bowel habits, loss of appetite, bloody stools.   Resp: No shortness of breath with exertion or at rest.  No excess mucus, no productive cough,  No non-productive cough,  No coughing up of blood.  No change in  color of mucus.  No wheezing.  No chest wall deformity  Skin: no rash or lesions.  GU: no dysuria, change in color of urine, no urgency or frequency.  No flank pain, no hematuria   MS:  No joint pain or swelling.  No decreased range of motion.  No back pain.    Physical Exam  BP 132/74 (BP Location: Left Arm, Cuff Size: Normal)   Pulse 74   Temp 97.8 F (36.6 C) (Temporal)   Ht 5' (1.524 m)   Wt 127 lb (57.6 kg)   SpO2 98% Comment: on room air  BMI 24.80 kg/m   GEN: A/Ox3; pleasant , NAD, well nourished    HEENT:  [place]/AT,  EACs-clear, TMs-wnl, NOSE-clear, THROAT-clear, no lesions, no postnasal drip or exudate noted.   NECK:  Supple w/ fair ROM; no JVD; normal carotid impulses w/o bruits; no thyromegaly or nodules palpated; no lymphadenopathy.    RESP  Clear  P & A; w/o, wheezes/ rales/ or rhonchi. no accessory muscle use, no dullness to percussion  CARD:  RRR, no m/r/g, no peripheral edema, pulses  intact, no cyanosis or clubbing.  GI:   Soft & nt; nml bowel sounds; no organomegaly or masses detected.   Musco: Warm bil, no deformities or joint swelling noted.   Neuro: alert, no focal deficits noted.    Skin: Warm, no lesions or rashes    Lab Results:   BNP  ProBNP No results found for: PROBNP  Imaging: Dg Chest 2 View  Result Date: 08/12/2019 CLINICAL DATA:  Recent pleural effusion EXAM: CHEST - 2 VIEW COMPARISON:  May 29, 2019 FINDINGS: Lungs are clear. No pleural effusions evident. Heart size and pulmonary vascularity are normal. No adenopathy. No pneumothorax. No bone lesions. IMPRESSION: Lungs clear. No pleural effusions. Cardiac silhouette normal. No adenopathy. Electronically Signed   By: Lowella Grip III M.D.   On: 08/12/2019 14:34      No flowsheet data found.  No results found for: NITRICOXIDE      Assessment & Plan:   No problem-specific Assessment & Plan notes found for this encounter.     Rexene Edison, NP 08/12/2019

## 2019-08-13 NOTE — Assessment & Plan Note (Signed)
Mediastinal mass with pleural effusion resolved with antibiotics and thoracentesis Follow-up PET scan with no identifiable mediastinal mass and no hypermetabolic activity. Chest x-ray today shows no adenopathy and no pleural effusions remaining.  Plan  Patient Instructions  Mucinex Twice daily  As needed  Cough/congesteion  Delsym 2 tsp Twice daily  As needed  Cough  Continue on Incentive Spirometry As needed   Follow up with Dr. Elsworth Soho  Or Kipp Shank NP in 4  months and As needed.  Please contact office for sooner follow up if symptoms do not improve or worsen or seek emergency care

## 2019-08-13 NOTE — Assessment & Plan Note (Signed)
Resolved on chest x-ray

## 2019-08-13 NOTE — Assessment & Plan Note (Signed)
Recent admission and improved with antibiotics follow-up with primary care provider

## 2019-08-31 ENCOUNTER — Inpatient Hospital Stay (HOSPITAL_COMMUNITY)
Admission: EM | Admit: 2019-08-31 | Discharge: 2019-09-02 | DRG: 639 | Disposition: A | Discharge status: Home / Self Care | Payer: Medicare Other | Attending: Internal Medicine | Admitting: Internal Medicine

## 2019-08-31 ENCOUNTER — Other Ambulatory Visit: Payer: Self-pay

## 2019-08-31 ENCOUNTER — Encounter (HOSPITAL_COMMUNITY): Payer: Self-pay

## 2019-08-31 DIAGNOSIS — Z83511 Family history of glaucoma: Secondary | ICD-10-CM

## 2019-08-31 DIAGNOSIS — I480 Paroxysmal atrial fibrillation: Secondary | ICD-10-CM | POA: Diagnosis present

## 2019-08-31 DIAGNOSIS — R112 Nausea with vomiting, unspecified: Secondary | ICD-10-CM | POA: Diagnosis present

## 2019-08-31 DIAGNOSIS — Z880 Allergy status to penicillin: Secondary | ICD-10-CM

## 2019-08-31 DIAGNOSIS — Z90411 Acquired partial absence of pancreas: Secondary | ICD-10-CM

## 2019-08-31 DIAGNOSIS — Z90710 Acquired absence of both cervix and uterus: Secondary | ICD-10-CM

## 2019-08-31 DIAGNOSIS — Z803 Family history of malignant neoplasm of breast: Secondary | ICD-10-CM

## 2019-08-31 DIAGNOSIS — Z8541 Personal history of malignant neoplasm of cervix uteri: Secondary | ICD-10-CM | POA: Diagnosis not present

## 2019-08-31 DIAGNOSIS — Z833 Family history of diabetes mellitus: Secondary | ICD-10-CM

## 2019-08-31 DIAGNOSIS — I10 Essential (primary) hypertension: Secondary | ICD-10-CM | POA: Diagnosis present

## 2019-08-31 DIAGNOSIS — Z818 Family history of other mental and behavioral disorders: Secondary | ICD-10-CM | POA: Diagnosis not present

## 2019-08-31 DIAGNOSIS — Z87891 Personal history of nicotine dependence: Secondary | ICD-10-CM | POA: Diagnosis not present

## 2019-08-31 DIAGNOSIS — R197 Diarrhea, unspecified: Secondary | ICD-10-CM | POA: Diagnosis present

## 2019-08-31 DIAGNOSIS — E119 Type 2 diabetes mellitus without complications: Principal | ICD-10-CM

## 2019-08-31 DIAGNOSIS — A084 Viral intestinal infection, unspecified: Secondary | ICD-10-CM | POA: Diagnosis present

## 2019-08-31 DIAGNOSIS — E1110 Type 2 diabetes mellitus with ketoacidosis without coma: Secondary | ICD-10-CM | POA: Diagnosis present

## 2019-08-31 DIAGNOSIS — E860 Dehydration: Secondary | ICD-10-CM

## 2019-08-31 DIAGNOSIS — E559 Vitamin D deficiency, unspecified: Secondary | ICD-10-CM | POA: Diagnosis present

## 2019-08-31 DIAGNOSIS — Z961 Presence of intraocular lens: Secondary | ICD-10-CM | POA: Diagnosis not present

## 2019-08-31 DIAGNOSIS — Z7901 Long term (current) use of anticoagulants: Secondary | ICD-10-CM

## 2019-08-31 DIAGNOSIS — Z9849 Cataract extraction status, unspecified eye: Secondary | ICD-10-CM | POA: Diagnosis not present

## 2019-08-31 DIAGNOSIS — Z20828 Contact with and (suspected) exposure to other viral communicable diseases: Secondary | ICD-10-CM | POA: Diagnosis present

## 2019-08-31 DIAGNOSIS — Z8249 Family history of ischemic heart disease and other diseases of the circulatory system: Secondary | ICD-10-CM

## 2019-08-31 DIAGNOSIS — Z79899 Other long term (current) drug therapy: Secondary | ICD-10-CM

## 2019-08-31 DIAGNOSIS — Z9049 Acquired absence of other specified parts of digestive tract: Secondary | ICD-10-CM

## 2019-08-31 DIAGNOSIS — Z9081 Acquired absence of spleen: Secondary | ICD-10-CM

## 2019-08-31 DIAGNOSIS — E782 Mixed hyperlipidemia: Secondary | ICD-10-CM | POA: Diagnosis present

## 2019-08-31 DIAGNOSIS — Z825 Family history of asthma and other chronic lower respiratory diseases: Secondary | ICD-10-CM | POA: Diagnosis not present

## 2019-08-31 DIAGNOSIS — Z8619 Personal history of other infectious and parasitic diseases: Secondary | ICD-10-CM | POA: Diagnosis not present

## 2019-08-31 DIAGNOSIS — Z888 Allergy status to other drugs, medicaments and biological substances status: Secondary | ICD-10-CM

## 2019-08-31 DIAGNOSIS — K219 Gastro-esophageal reflux disease without esophagitis: Secondary | ICD-10-CM | POA: Diagnosis present

## 2019-08-31 DIAGNOSIS — Z794 Long term (current) use of insulin: Secondary | ICD-10-CM

## 2019-08-31 LAB — BETA-HYDROXYBUTYRIC ACID
Beta-Hydroxybutyric Acid: 3.22 mmol/L — ABNORMAL HIGH (ref 0.05–0.27)
Beta-Hydroxybutyric Acid: 0.09 mmol/L (ref 0.05–0.27)

## 2019-08-31 LAB — BASIC METABOLIC PANEL
Anion gap: 18 — ABNORMAL HIGH (ref 5–15)
Anion gap: 11 (ref 5–15)
Anion gap: 9 (ref 5–15)
BUN: 23 mg/dL (ref 8–23)
BUN: 20 mg/dL (ref 8–23)
BUN: 20 mg/dL (ref 8–23)
CO2: 20 mmol/L — ABNORMAL LOW (ref 22–32)
CO2: 22 mmol/L (ref 22–32)
CO2: 23 mmol/L (ref 22–32)
Calcium: 9.2 mg/dL (ref 8.9–10.3)
Calcium: 8.4 mg/dL — ABNORMAL LOW (ref 8.9–10.3)
Calcium: 8.7 mg/dL — ABNORMAL LOW (ref 8.9–10.3)
Chloride: 98 mmol/L (ref 98–111)
Chloride: 103 mmol/L (ref 98–111)
Chloride: 106 mmol/L (ref 98–111)
Creatinine, Ser: 0.88 mg/dL (ref 0.44–1.00)
Creatinine, Ser: 0.83 mg/dL (ref 0.44–1.00)
Creatinine, Ser: 0.95 mg/dL (ref 0.44–1.00)
GFR calc Af Amer: >60 mL/min (ref >60)
GFR calc Af Amer: >60 mL/min (ref >60)
GFR calc Af Amer: >60 mL/min (ref >60)
GFR calc non Af Amer: >60 mL/min (ref >60)
GFR calc non Af Amer: >60 mL/min (ref >60)
GFR calc non Af Amer: >60 mL/min (ref >60)
Glucose, Bld: 370 mg/dL — ABNORMAL HIGH (ref 70–99)
Glucose, Bld: 248 mg/dL — ABNORMAL HIGH (ref 70–99)
Glucose, Bld: 106 mg/dL — ABNORMAL HIGH (ref 70–99)
Potassium: 3.8 mmol/L (ref 3.5–5.1)
Potassium: 3.4 mmol/L — ABNORMAL LOW (ref 3.5–5.1)
Potassium: 4.2 mmol/L (ref 3.5–5.1)
Sodium: 136 mmol/L (ref 135–145)
Sodium: 136 mmol/L (ref 135–145)
Sodium: 138 mmol/L (ref 135–145)

## 2019-08-31 LAB — CBC WITH DIFFERENTIAL/PLATELET
Abs Immature Granulocytes: 0.05 10*3/uL (ref 0.00–0.07)
Basophils Absolute: 0.1 10*3/uL (ref 0.0–0.1)
Basophils Relative: 1 %
Eosinophils Absolute: 0.5 10*3/uL (ref 0.0–0.5)
Eosinophils Relative: 5 %
HCT: 37.5 % (ref 36.0–46.0)
Hemoglobin: 11.7 g/dL — ABNORMAL LOW (ref 12.0–15.0)
Immature Granulocytes: 0 %
Lymphocytes Relative: 19 %
Lymphs Abs: 2.2 10*3/uL (ref 0.7–4.0)
MCH: 25.1 pg — ABNORMAL LOW (ref 26.0–34.0)
MCHC: 31.2 g/dL (ref 30.0–36.0)
MCV: 80.5 fL (ref 80.0–100.0)
Monocytes Absolute: 0.6 10*3/uL (ref 0.1–1.0)
Monocytes Relative: 5 %
Neutro Abs: 8.1 10*3/uL — ABNORMAL HIGH (ref 1.7–7.7)
Neutrophils Relative %: 70 %
Platelets: 199 10*3/uL (ref 150–400)
RBC: 4.66 MIL/uL (ref 3.87–5.11)
RDW: 16.2 % — ABNORMAL HIGH (ref 11.5–15.5)
WBC: 11.6 10*3/uL — ABNORMAL HIGH (ref 4.0–10.5)
nRBC: 0 % (ref 0.0–0.2)

## 2019-08-31 LAB — URINALYSIS, ROUTINE W REFLEX MICROSCOPIC
Bilirubin Urine: NEGATIVE
Glucose, UA: >=500 mg/dL — AB
Ketones, ur: 80 mg/dL — AB
Leukocytes,Ua: NEGATIVE
Nitrite: NEGATIVE
Protein, ur: >=300 mg/dL — AB
Specific Gravity, Urine: 1.021 (ref 1.005–1.030)
pH: 5 (ref 5.0–8.0)

## 2019-08-31 LAB — CBG MONITORING, ED
Glucose-Capillary: 320 mg/dL — ABNORMAL HIGH (ref 70–99)
Glucose-Capillary: 299 mg/dL — ABNORMAL HIGH (ref 70–99)
Glucose-Capillary: 150 mg/dL — ABNORMAL HIGH (ref 70–99)
Glucose-Capillary: 171 mg/dL — ABNORMAL HIGH (ref 70–99)
Glucose-Capillary: 174 mg/dL — ABNORMAL HIGH (ref 70–99)
Glucose-Capillary: 157 mg/dL — ABNORMAL HIGH (ref 70–99)
Glucose-Capillary: 113 mg/dL — ABNORMAL HIGH (ref 70–99)
Glucose-Capillary: 129 mg/dL — ABNORMAL HIGH (ref 70–99)

## 2019-08-31 LAB — COMPREHENSIVE METABOLIC PANEL
ALT: 21 U/L (ref 0–44)
AST: 20 U/L (ref 15–41)
Albumin: 4 g/dL (ref 3.5–5.0)
Alkaline Phosphatase: 103 U/L (ref 38–126)
Anion gap: 18 — ABNORMAL HIGH (ref 5–15)
BUN: 27 mg/dL — ABNORMAL HIGH (ref 8–23)
CO2: 20 mmol/L — ABNORMAL LOW (ref 22–32)
Calcium: 9.1 mg/dL (ref 8.9–10.3)
Chloride: 97 mmol/L — ABNORMAL LOW (ref 98–111)
Creatinine, Ser: 0.98 mg/dL (ref 0.44–1.00)
GFR calc Af Amer: >60 mL/min (ref >60)
GFR calc non Af Amer: >60 mL/min (ref >60)
Glucose, Bld: 405 mg/dL — ABNORMAL HIGH (ref 70–99)
Potassium: 3.5 mmol/L (ref 3.5–5.1)
Sodium: 135 mmol/L (ref 135–145)
Total Bilirubin: 1.7 mg/dL — ABNORMAL HIGH (ref 0.3–1.2)
Total Protein: 7.4 g/dL (ref 6.5–8.1)

## 2019-08-31 LAB — SARS CORONAVIRUS 2 (TAT 6-24 HRS): SARS Coronavirus 2: NEGATIVE

## 2019-08-31 LAB — LIPASE, BLOOD: Lipase: 21 U/L (ref 11–51)

## 2019-08-31 LAB — HEMOGLOBIN A1C
Hgb A1c MFr Bld: 12.8 % — ABNORMAL HIGH (ref 4.8–5.6)
Mean Plasma Glucose: 320.66 mg/dL

## 2019-08-31 MED ORDER — DEXTROSE 50 % IV SOLN: 0.0000 mL | INTRAVENOUS | Status: DC | PRN

## 2019-08-31 MED ORDER — POTASSIUM CHLORIDE 10 MEQ/100ML IV SOLN
10.0000 meq | INTRAVENOUS | Status: AC
  Administered 2019-08-31 – 2019-09-01 (×2): 10 meq via INTRAVENOUS
  Filled 2019-08-31 (×2): qty 100

## 2019-08-31 MED ORDER — ONDANSETRON HCL 4 MG/2ML IJ SOLN
4.0000 mg | Freq: Four times a day (QID) | INTRAMUSCULAR | Status: DC | PRN
  Administered 2019-08-31 (×2): 4 mg via INTRAVENOUS
  Filled 2019-08-31 (×2): qty 2

## 2019-08-31 MED ORDER — POTASSIUM CHLORIDE 20 MEQ PO PACK
60.0000 meq | PACK | Freq: Once | ORAL | Status: AC
  Administered 2019-08-31: 60 meq via ORAL
  Filled 2019-08-31: qty 3

## 2019-08-31 MED ORDER — VITAMIN D3 25 MCG (1000 UNIT) PO TABS
5000.0000 [IU] | ORAL_TABLET | Freq: Every evening | ORAL | Status: DC
  Administered 2019-08-31 – 2019-09-01 (×2): 5000 [IU] via ORAL
  Filled 2019-08-31 (×5): qty 5

## 2019-08-31 MED ORDER — INSULIN NPH (HUMAN) (ISOPHANE) 100 UNIT/ML ~~LOC~~ SUSP
4.0000 [IU] | Freq: Once | SUBCUTANEOUS | Status: AC
  Administered 2019-08-31: 4 [IU] via SUBCUTANEOUS
  Filled 2019-08-31: qty 10

## 2019-08-31 MED ORDER — PANTOPRAZOLE SODIUM 40 MG PO TBEC
40.0000 mg | DELAYED_RELEASE_TABLET | Freq: Every day | ORAL | Status: DC
  Administered 2019-09-01 – 2019-09-02 (×2): 40 mg via ORAL
  Filled 2019-08-31 (×3): qty 1

## 2019-08-31 MED ORDER — DEXTROSE-NACL 5-0.45 % IV SOLN: INTRAVENOUS | Status: DC

## 2019-08-31 MED ORDER — INSULIN REGULAR(HUMAN) IN NACL 100-0.9 UT/100ML-% IV SOLN: INTRAVENOUS | Status: DC

## 2019-08-31 MED ORDER — TRIAMCINOLONE ACETONIDE 55 MCG/ACT NA AERO
2.0000 | INHALATION_SPRAY | Freq: Every day | NASAL | Status: DC | PRN
  Filled 2019-08-31: qty 21.6

## 2019-08-31 MED ORDER — POTASSIUM CHLORIDE 10 MEQ/100ML IV SOLN
10.0000 meq | INTRAVENOUS | Status: AC
  Administered 2019-08-31: 10 meq via INTRAVENOUS
  Filled 2019-08-31 (×2): qty 100

## 2019-08-31 MED ORDER — SODIUM CHLORIDE 0.9 % IV SOLN
INTRAVENOUS | Status: DC
  Administered 2019-08-31: 12:00:00 via INTRAVENOUS

## 2019-08-31 MED ORDER — INSULIN NPH (HUMAN) (ISOPHANE) 100 UNIT/ML ~~LOC~~ SUSP
7.0000 [IU] | Freq: Every day | SUBCUTANEOUS | Status: DC
  Filled 2019-08-31: qty 10

## 2019-08-31 MED ORDER — DEXTROSE-NACL 5-0.45 % IV SOLN
INTRAVENOUS | Status: DC
  Administered 2019-08-31: 15:00:00 via INTRAVENOUS

## 2019-08-31 MED ORDER — INSULIN REGULAR(HUMAN) IN NACL 100-0.9 UT/100ML-% IV SOLN
INTRAVENOUS | Status: DC
  Administered 2019-08-31: 7 [IU]/h via INTRAVENOUS
  Filled 2019-08-31: qty 100

## 2019-08-31 MED ORDER — ATORVASTATIN CALCIUM 10 MG PO TABS
10.0000 mg | ORAL_TABLET | Freq: Every day | ORAL | Status: DC
  Administered 2019-08-31 – 2019-09-01 (×2): 10 mg via ORAL
  Filled 2019-08-31 (×2): qty 1

## 2019-08-31 MED ORDER — PREGABALIN 75 MG PO CAPS
75.0000 mg | ORAL_CAPSULE | Freq: Every day | ORAL | Status: DC
  Administered 2019-08-31 – 2019-09-01 (×2): 75 mg via ORAL
  Filled 2019-08-31 (×2): qty 1

## 2019-08-31 MED ORDER — SODIUM CHLORIDE 0.9 % IV SOLN
INTRAVENOUS | Status: DC
  Administered 2019-08-31 – 2019-09-02 (×3): via INTRAVENOUS

## 2019-08-31 MED ORDER — LABETALOL HCL 5 MG/ML IV SOLN
10.0000 mg | Freq: Four times a day (QID) | INTRAVENOUS | Status: DC | PRN
  Administered 2019-08-31 – 2019-09-02 (×4): 10 mg via INTRAVENOUS
  Filled 2019-08-31 (×3): qty 4

## 2019-08-31 MED ORDER — CHLORHEXIDINE GLUCONATE CLOTH 2 % EX PADS
6.0000 | MEDICATED_PAD | Freq: Every day | CUTANEOUS | Status: DC
  Administered 2019-08-31 – 2019-09-01 (×2): 6 via TOPICAL

## 2019-08-31 MED ORDER — LACTATED RINGERS IV BOLUS
1000.0000 mL | Freq: Once | INTRAVENOUS | Status: AC
  Administered 2019-08-31: 1000 mL via INTRAVENOUS

## 2019-08-31 MED ORDER — APIXABAN 5 MG PO TABS
5.0000 mg | ORAL_TABLET | Freq: Two times a day (BID) | ORAL | Status: DC
  Administered 2019-08-31 – 2019-09-02 (×4): 5 mg via ORAL
  Filled 2019-08-31 (×4): qty 1

## 2019-08-31 MED ORDER — LOPERAMIDE HCL 2 MG PO CAPS
4.0000 mg | ORAL_CAPSULE | Freq: Once | ORAL | Status: AC
  Administered 2019-08-31: 4 mg via ORAL
  Filled 2019-08-31: qty 2

## 2019-08-31 MED ORDER — CLONAZEPAM 0.5 MG PO TABS
0.5000 mg | ORAL_TABLET | Freq: Two times a day (BID) | ORAL | Status: DC | PRN
  Administered 2019-09-01 (×2): 0.5 mg via ORAL
  Filled 2019-08-31 (×2): qty 1

## 2019-08-31 MED ORDER — PROMETHAZINE HCL 25 MG/ML IJ SOLN
12.5000 mg | Freq: Once | INTRAMUSCULAR | Status: AC
  Administered 2019-08-31: 12.5 mg via INTRAVENOUS
  Filled 2019-08-31: qty 1

## 2019-08-31 MED ORDER — INSULIN NPH (HUMAN) (ISOPHANE) 100 UNIT/ML ~~LOC~~ SUSP
6.0000 [IU] | Freq: Every day | SUBCUTANEOUS | Status: DC
  Administered 2019-09-01: 6 [IU] via SUBCUTANEOUS

## 2019-08-31 MED ORDER — SODIUM CHLORIDE 0.9 % IV SOLN
INTRAVENOUS | Status: DC
  Administered 2019-08-31: 13:00:00 via INTRAVENOUS

## 2019-08-31 NOTE — H&P (Signed)
History and Physical    [name] YSA:[id number] DOB: 11-08-1955 DOA: 08/31/2019  PCP: Leeroy Cha, MD  Patient coming from: Home  I have personally briefly reviewed patient's old medical records in [place]  Chief Complaint: Diarrhea/N/V  HPI: [name] is a 62 y.o. female with medical history significant of HTN, HLD, IDDM, Paroxysmal A. Fib on Apixabam, GERD, Remote hx of botulism per patient, hx of splenectomy, partial pancreatectomy, Gout and hx of cervical ca who presents with nausea/vomiting/diarrhea.  Patient reports she was in [place] thru Sunday night, when she had salmon for dinner.  She reports she woke up in the middle of the morning around Monday 3:30 am with nausea/vomiting/adbominal pain and diarrhea.  She states since then she has been having frequent stools and emesis and has been unable to keep down any food or meds.  She denies any fevers or other localizing symptoms - no dysuria, cough/sob, evidence of cellulitis.  She denies any tobacco, alcohol or drugs.   Review of Systems: As per HPI otherwise 10 point review of systems negative.   Past Medical History:  Diagnosis Date  . Cancer ([place]) 1977   CERVICAL  . GERD (gastroesophageal reflux disease) 11/14/2013  . Gout   . Mixed hyperlipidemia 11/14/2013    Past Surgical History:  Procedure Laterality Date  . 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 CHOLECYSTECTOMY    . EYE SURGERY     6/3 LASER FOR GLAUCOMA, 9/03 CE/IOL IMPLANTS  . INGUINAL LYMPH NODE BIOPSY Right 06/08/2018   Procedure: RIGHT INGUINAL LYMPH NODE EXCISIONAL BIOPSY;  Surgeon: Clovis Riley, MD;  Location: [facility];  Service: General;  Laterality: Right;  . pancreatectomy    . SHOULDER CLOSED REDUCTION Left 02/03/2013   Procedure: CLOSED MANIPULATION SHOULDER;  Surgeon: Vickey Huger, MD;  Location: [facility];  Service: Orthopedics;  Laterality: Left;  CLOSED MANIPULATION LEFT SHOULDER  . SPLENECTOMY    . TONSILLECTOMY   1970     reports that she quit smoking about 29 years ago. She has never used smokeless tobacco. She reports that she does not drink alcohol or use drugs.  Allergies  Allergen Reactions  . Gabapentin Other (See Comments)    Caused upset stomach  . Penicillins Hives and Other (See Comments)    Has patient had a PCN reaction causing immediate rash, facial/tongue/throat swelling, SOB or lightheadedness with hypotension: Unknown Has patient had a PCN reaction causing severe rash involving mucus membranes or skin necrosis: Unknown Has patient had a PCN reaction that required hospitalization: Unknown Has patient had a PCN reaction occurring within the last 10 years: No If all of the above answers are "NO", then may proceed with Cephalosporin use.   Marland Kitchen Pepcid [Famotidine] Other (See Comments)    HEADACHE    Family History  Problem Relation Age of Onset  . Dementia Mother   . Heart disease Mother   . Hypertension Mother   . COPD Mother   . Breast cancer Mother 27  . Heart disease Father   . Diabetes Father   . Glaucoma Father   . Cancer Father        BLADDER  . Depression Sister      Prior to Admission medications   Medication Sig Start Date End Date Taking? Authorizing Provider  amLODipine (NORVASC) 5 MG tablet Take 5 mg by mouth daily.    [provider]  apixaban (ELIQUIS) 5 MG TABS tablet Take 1 tablet (5  mg total) by mouth 2 (two) times daily. 05/17/19   Dixie Dials, MD  atorvastatin (LIPITOR) 10 MG tablet Take 10 mg by mouth daily at 6 PM.  02/14/19   [provider]  Blood Glucose Monitoring Suppl (ONETOUCH VERIO) w/Device KIT 1 kit by Does not apply route 3 (three) times daily. 12/24/16   Unk Pinto, MD  Cholecalciferol (VITAMIN D-3) 5000 units TABS Take 5,000 tablets by mouth every evening.    [provider]  clonazePAM (KLONOPIN) 1 MG tablet Take 0.5 mg by mouth daily as needed for anxiety.  05/27/18   [provider]  furosemide  (LASIX) 20 MG tablet Take 20 mg by mouth daily as needed for fluid. 06/30/19   [provider]  glucose blood (ONETOUCH VERIO) test strip CHECK BLOOD SUGAR 3 TIMES DAILY-DX-E11.9 AND Z79.4. 12/24/16   Unk Pinto, MD  insulin NPH-regular Human (70-30) 100 UNIT/ML injection Inject 5-6 Units into the skin See admin instructions. Take 6 units in the morning, and 5 units at night. Hold insulin if blood sugar is less than 99 mg.  Decrease to 3 units if blood sugar is 100 to 140 mg. Patient taking differently: Inject 5-6 Units into the skin See admin instructions. Take 7 units in the morning, 2 units at lunch and 6 units in the evening. Hold insulin if blood sugar is less than 99 mg.  Decrease to 3 units if blood sugar is 100 to 140 mg. 05/17/19   Dixie Dials, MD  losartan (COZAAR) 25 MG tablet Take 1 tablet (25 mg total) by mouth daily. 05/17/19   Dixie Dials, MD  metoprolol succinate (TOPROL-XL) 25 MG 24 hr tablet Take 1 tablet (25 mg total) by mouth daily. 05/18/19   Dixie Dials, MD  omeprazole (PRILOSEC) 20 MG capsule Take 20 mg by mouth daily.    [provider]  Jonetta Speak LANCETS 24Q MISC Check blood sugar 3 times daily-DX-E11.9 and Z79.4. 01/23/17   Unk Pinto, MD  pregabalin (LYRICA) 75 MG capsule Take 1 capsule (75 mg total) by mouth at bedtime. 08/04/19   Trula Slade, DPM  Propylene Glycol (SYSTANE COMPLETE) 0.6 % SOLN Place 1 drop into both eyes 2 (two) times daily as needed (for dry eyes).    [provider]  triamcinolone (NASACORT ALLERGY 24HR) 55 MCG/ACT AERO nasal inhaler Place 2 sprays into the nose daily as needed.    [provider]    Physical Exam: Vitals:   08/31/19 0934 08/31/19 0935 08/31/19 1130 08/31/19 1245  BP: (!) 174/89  (!) 163/77   Pulse: 92  86 87  Resp: 16   19  Temp: 98.2 F (36.8 C)     TempSrc: Oral     SpO2: 100% 100% 98% 100%     Vitals:   08/31/19 0934 08/31/19 0935 08/31/19 1130 08/31/19 1245   BP: (!) 174/89  (!) 163/77   Pulse: 92  86 87  Resp: 16   19  Temp: 98.2 F (36.8 C)     TempSrc: Oral     SpO2: 100% 100% 98% 100%    Constitutional: NAD, calm but fatigued, otherwise comfortable Eyes: PERRL, lids and conjunctivae normal ENMT: Mucous membranes are dry. Posterior pharynx clear of any exudate or lesions.Normal dentition.  Neck: normal, supple, no masses, no thyromegaly Respiratory: clear to auscultation bilaterally, no wheezing, no crackles. Normal respiratory effort. No accessory muscle use.  Cardiovascular: Regular rate and rhythm, no murmurs / rubs / gallops. No extremity edema.  2+ pedal pulses. No carotid bruits.  Abdomen: diffuse tenderness with deep palpation without rebound or guarding, no masses palpated. No hepatosplenomegaly. Bowel sounds positive.  Musculoskeletal: no clubbing / cyanosis. No joint deformity upper and lower extremities. Good ROM, no contractures. Normal muscle tone.  Skin: no rashes, lesions, ulcers. No induration Neurologic: CN 2-12 grossly intact. Sensation and strength grossly intact. Psychiatric: Normal judgment and insight. Alert and oriented x 3. Normal mood.   Labs on Admission: I have personally reviewed following labs and imaging studies  CBC: Recent Labs  Lab 08/31/19 1132  WBC 11.6*  NEUTROABS 8.1*  HGB 11.7*  HCT 37.5  MCV 80.5  PLT 564   Basic Metabolic Panel: Recent Labs  Lab 08/31/19 0949 08/31/19 1235  NA 135 136  K 3.5 3.8  CL 97* 98  CO2 20* 20*  GLUCOSE 405* 370*  BUN 27* 23  CREATININE 0.98 0.88  CALCIUM 9.1 9.2   GFR: CrCl cannot be calculated (Unknown ideal weight.). Liver Function Tests: Recent Labs  Lab 08/31/19 0949  AST 20  ALT 21  ALKPHOS 103  BILITOT 1.7*  PROT 7.4  ALBUMIN 4.0   Recent Labs  Lab 08/31/19 0949  LIPASE 21   No results for input(s): AMMONIA in the last 168 hours. Coagulation Profile: No results for input(s): INR, PROTIME in the last 168 hours. Cardiac Enzymes:  No results for input(s): CKTOTAL, CKMB, CKMBINDEX, TROPONINI in the last 168 hours. BNP (last 3 results) No results for input(s): PROBNP in the last 8760 hours. HbA1C: No results for input(s): HGBA1C in the last 72 hours. CBG: Recent Labs  Lab 08/31/19 1246 08/31/19 1342  GLUCAP 320* 299*   Lipid Profile: No results for input(s): CHOL, HDL, LDLCALC, TRIG, CHOLHDL, LDLDIRECT in the last 72 hours. Thyroid Function Tests: No results for input(s): TSH, T4TOTAL, FREET4, T3FREE, THYROIDAB in the last 72 hours. Anemia Panel: No results for input(s): VITAMINB12, FOLATE, FERRITIN, TIBC, IRON, RETICCTPCT in the last 72 hours. Urine analysis:    Component Value Date/Time   COLORURINE YELLOW 08/31/2019 [place] 08/31/2019 0949   LABSPEC 1.021 08/31/2019 0949   PHURINE 5.0 08/31/2019 0949   GLUCOSEU >=500 (A) 08/31/2019 0949   HGBUR MODERATE (A) 08/31/2019 0949   BILIRUBINUR NEGATIVE 08/31/2019 0949   KETONESUR 80 (A) 08/31/2019 0949   PROTEINUR >=300 (A) 08/31/2019 0949   UROBILINOGEN 0.2 12/07/2011 1031   NITRITE NEGATIVE 08/31/2019 0949   LEUKOCYTESUR NEGATIVE 08/31/2019 0949    Radiological Exams on Admission: No results found.  EKG: Independently reviewed.   Assessment/Plan [name] is a 63 y.o. female with medical history significant of HTN, HLD, IDDM, Paroxysmal A. Fib on Apixabam, GERD, Remote hx of botulism per patient, hx of splenectomy, partial pancreatectomy, Gout and hx of cervical ca who presents with nausea/vomiting/diarrhea.  # DKA/IDDM # Acute viral gastroenteritis - suspect patient's mild DKA precipitated by possible viral gastroenteritis vs less likely food poisoning.  AG at presentation is 18, CBG 405.  Patient was placed on insulin infusion without bolus in ER. - continue DKA protocol, supportive care for gastroenteritis; CO2 of 20 is good sign and her renal function is wnl, no AKI - RN to contact staff when AG closes so that patient can  be transitioned to home NPH insulin dose (she takes a regimen of 7 u qAM, 2 U qPM, 6 U qHS)  # HTN - held oral antihypertensives with her diarrhea and dehydration at moment  # HLD - continue  atorvastatin  # Paroxysmal Atrial Fibrillation - continued apixaban, metoprolol presently held but would resume this agent sooner if HR shows rebound tachycardia despite fluid resuscitation  # GERD - continue pantoprazole  DVT prophylaxis: Apixaban Code Status: Full Family Communication: Husband at bedside Disposition Plan: pending Admission status: med surg   Truddie Hidden MD [facility] Pager [phone]  If 7PM-7AM, please contact night-coverage [email or web address] Password [facility]  08/31/2019, 2:49 PM

## 2019-08-31 NOTE — ED Provider Notes (Signed)
[facility] DEPT Provider Note   CSN: [id number] Arrival date & time: 08/31/19  0258     History   Chief Complaint No chief complaint on file.   HPI [name] is a 63 y.o. female.     HPI   63 year old female with nausea and vomiting.  Onset Saturday.  Persistent since then.  Some loose stools.  She states that over the past several days she has not been able to keep anything down.  She stopped taking her insulin but she has not been eating anything significant.  Her symptoms continue to progress so she came in today.  She denies any abdominal pain per se but states that it feels sore which she attributes to all of vomiting.  No urinary complaints.  No fevers or chills.  No acute respiratory complaints.  No sick contacts that she is aware of.   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   Elevated LFTs    Pleural effusion    Gout 06/11/2015   Non-compliance with treatment 09/20/2014   Vitamin D deficiency 09/07/2014   GERD (gastroesophageal reflux disease) 11/14/2013   Essential hypertension 11/14/2013   Mixed hyperlipidemia 11/14/2013   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    Past Surgical History:  Procedure Laterality Date   ABDOMINAL HYSTERECTOMY  1977   CERVICAL CA   CHOLECYSTECTOMY     EYE SURGERY     6/3 LASER FOR GLAUCOMA, 9/03 CE/IOL IMPLANTS   INGUINAL LYMPH NODE BIOPSY Right 06/08/2018   Procedure: RIGHT INGUINAL LYMPH NODE EXCISIONAL BIOPSY;  Surgeon: Clovis Riley, MD;  Location: [facility] ORS;  Service: General;   Laterality: Right;   pancreatectomy     SHOULDER CLOSED REDUCTION Left 02/03/2013   Procedure: CLOSED MANIPULATION SHOULDER;  Surgeon: Vickey Huger, MD;  Location: [facility] OR[facility];  Service: Orthopedics;  Laterality: Left;  CLOSED MANIPULATION LEFT SHOULDER   SPLENECTOMY     TONSILLECTOMY  1970     OB History   No obstetric history on file.     Home Medications    Prior to Admission medications   Medication Sig Start Date End Date Taking? Authorizing Provider  amLODipine (NORVASC) 5 MG tablet Take 5 mg by mouth daily.    [provider]  apixaban (ELIQUIS) 5 MG TABS tablet Take 1 tablet (5 mg total) by mouth 2 (two) times daily. 05/17/19   Dixie Dials, MD  atorvastatin (LIPITOR) 10 MG tablet Take 10 mg by mouth daily at 6 PM.  02/14/19   [provider]  Blood Glucose Monitoring Suppl (ONETOUCH VERIO) w/Device KIT 1 kit by Does not apply route 3 (three) times daily. 12/24/16   Unk Pinto, MD  Cholecalciferol (VITAMIN D-3) 5000 units TABS Take 5,000 tablets by mouth every evening.    [provider]  clonazePAM (KLONOPIN) 1 MG tablet Take 0.5 mg by mouth daily as needed for anxiety.  05/27/18   [provider]  glucose blood (ONETOUCH VERIO) test strip CHECK BLOOD SUGAR 3 TIMES DAILY-DX-E11.9 AND Z79.4. 12/24/16   Unk Pinto, MD  insulin  NPH-regular Human (70-30) 100 UNIT/ML injection Inject 5-6 Units into the skin See admin instructions. Take 6 units in the morning, and 5 units at night. Hold insulin if blood sugar is less than 99 mg.  Decrease to 3 units if blood sugar is 100 to 140 mg. Patient taking differently: Inject 5-6 Units into the skin See admin instructions. Take 7 units in the morning, 2 units at lunch and 6 units in the evening. Hold insulin if blood sugar is less than 99 mg.  Decrease to 3 units if blood sugar is 100 to 140 mg. 05/17/19   Dixie Dials, MD  losartan (COZAAR) 25 MG tablet Take 1 tablet (25 mg total) by mouth daily. 05/17/19    Dixie Dials, MD  metoprolol succinate (TOPROL-XL) 25 MG 24 hr tablet Take 1 tablet (25 mg total) by mouth daily. 05/18/19   Dixie Dials, MD  omeprazole (PRILOSEC) 20 MG capsule Take 20 mg by mouth daily.    [provider]  Jonetta Speak LANCETS 16B MISC Check blood sugar 3 times daily-DX-E11.9 and Z79.4. 01/23/17   Unk Pinto, MD  pregabalin (LYRICA) 75 MG capsule Take 1 capsule (75 mg total) by mouth at bedtime. 08/04/19   Trula Slade, DPM  Propylene Glycol (SYSTANE COMPLETE) 0.6 % SOLN Place 1 drop into both eyes 2 (two) times daily as needed (for dry eyes).    [provider]  triamcinolone (NASACORT ALLERGY 24HR) 55 MCG/ACT AERO nasal inhaler Place 2 sprays into the nose daily as needed.    [provider]   Family History Family History  Problem Relation Age of Onset   Dementia Mother    Heart disease Mother    Hypertension Mother    COPD Mother    Breast cancer Mother 88   Heart disease Father    Diabetes Father    Glaucoma Father    Cancer Father        BLADDER   Depression Sister    Social History Social History   Tobacco Use   Smoking status: Former Smoker    Quit date: 09/23/1989    Years since quitting: 29.9   Smokeless tobacco: Never Used  Substance Use Topics   Alcohol use: No    Alcohol/week: 0.0 standard drinks   Drug use: No    Comment: quit>20years ago   Allergies   Gabapentin, Penicillins, and Pepcid [famotidine]  Review of Systems Review of Systems  All systems reviewed and negative, other than as noted in HPI.  Physical Exam Updated Vital Signs BP (!) 174/89 (BP Location: Left Arm)    Pulse 92    Temp 98.2 F (36.8 C) (Oral)    Resp 16    SpO2 100%   Physical Exam Vitals signs and nursing note reviewed.  Constitutional:      Appearance: She is well-developed.     Comments: Sitting up in bed.  Appears uncomfortable.  Actively retching.  HENT:     Head: Normocephalic and atraumatic.    Eyes:     General:        Right eye: No discharge.        Left eye: No discharge.     Conjunctiva/sclera: Conjunctivae normal.  Neck:     Musculoskeletal: Neck supple.  Cardiovascular:     Rate and Rhythm: Normal rate and regular rhythm.     Heart sounds: Normal heart sounds. No murmur. No friction rub. No gallop.   Pulmonary:     Effort: Pulmonary effort  is normal. No respiratory distress.     Breath sounds: Normal breath sounds.  Abdominal:     General: There is no distension.     Palpations: Abdomen is soft.     Tenderness: There is no abdominal tenderness.  Musculoskeletal:        General: No tenderness.  Skin:    General: Skin is warm and dry.  Neurological:     Mental Status: She is alert.  Psychiatric:        Behavior: Behavior normal.        Thought Content: Thought content normal.      ED Treatments / Results  Labs (all labs ordered are listed, but only abnormal results are displayed) Labs Reviewed  COMPREHENSIVE METABOLIC PANEL - Abnormal; Notable for the following components:      Result Value   Chloride 97 (*)    CO2 20 (*)    Glucose, Bld 405 (*)    BUN 27 (*)    Total Bilirubin 1.7 (*)    Anion gap 18 (*)    All other components within normal limits  URINALYSIS, ROUTINE W REFLEX MICROSCOPIC - Abnormal; Notable for the following components:   Glucose, UA >=500 (*)    Hgb urine dipstick MODERATE (*)    Ketones, ur 80 (*)    Protein, ur >=300 (*)    Bacteria, UA RARE (*)    All other components within normal limits  CBC WITH DIFFERENTIAL/PLATELET - Abnormal; Notable for the following components:   WBC 11.6 (*)    Hemoglobin 11.7 (*)    MCH 25.1 (*)    RDW 16.2 (*)    Neutro Abs 8.1 (*)    All other components within normal limits  BASIC METABOLIC PANEL - Abnormal; Notable for the following components:   CO2 20 (*)    Glucose, Bld 370 (*)    Anion gap 18 (*)    All other components within normal limits  BASIC METABOLIC PANEL - Abnormal;  Notable for the following components:   Potassium 3.4 (*)    Glucose, Bld 248 (*)    Calcium 8.4 (*)    All other components within normal limits  BASIC METABOLIC PANEL - Abnormal; Notable for the following components:   Glucose, Bld 106 (*)    Calcium 8.7 (*)    All other components within normal limits  BETA-HYDROXYBUTYRIC ACID - Abnormal; Notable for the following components:   Beta-Hydroxybutyric Acid 3.22 (*)    All other components within normal limits  BASIC METABOLIC PANEL - Abnormal; Notable for the following components:   CO2 21 (*)    Glucose, Bld 125 (*)    All other components within normal limits  BETA-HYDROXYBUTYRIC ACID - Abnormal; Notable for the following components:   Beta-Hydroxybutyric Acid 1.28 (*)    All other components within normal limits  BASIC METABOLIC PANEL - Abnormal; Notable for the following components:   Glucose, Bld 117 (*)    Calcium 8.5 (*)    All other components within normal limits  HEMOGLOBIN A1C - Abnormal; Notable for the following components:   Hgb A1c MFr Bld 12.8 (*)    All other components within normal limits  GLUCOSE, CAPILLARY - Abnormal; Notable for the following components:   Glucose-Capillary 103 (*)    All other components within normal limits  GLUCOSE, CAPILLARY - Abnormal; Notable for the following components:   Glucose-Capillary 115 (*)    All other components within normal limits  GLUCOSE, CAPILLARY -  Abnormal; Notable for the following components:   Glucose-Capillary 105 (*)    All other components within normal limits  CBG MONITORING, ED - Abnormal; Notable for the following components:   Glucose-Capillary 320 (*)    All other components within normal limits  CBG MONITORING, ED - Abnormal; Notable for the following components:   Glucose-Capillary 299 (*)    All other components within normal limits  CBG MONITORING, ED - Abnormal; Notable for the following components:   Glucose-Capillary 150 (*)    All other  components within normal limits  CBG MONITORING, ED - Abnormal; Notable for the following components:   Glucose-Capillary 171 (*)    All other components within normal limits  CBG MONITORING, ED - Abnormal; Notable for the following components:   Glucose-Capillary 174 (*)    All other components within normal limits  CBG MONITORING, ED - Abnormal; Notable for the following components:   Glucose-Capillary 157 (*)    All other components within normal limits  CBG MONITORING, ED - Abnormal; Notable for the following components:   Glucose-Capillary 129 (*)    All other components within normal limits  CBG MONITORING, ED - Abnormal; Notable for the following components:   Glucose-Capillary 113 (*)    All other components within normal limits  SARS CORONAVIRUS 2 (TAT 6-24 HRS)  MRSA PCR SCREENING  LIPASE, BLOOD  BETA-HYDROXYBUTYRIC ACID  GLUCOSE, CAPILLARY  CBC WITH DIFFERENTIAL/PLATELET  BASIC METABOLIC PANEL  BASIC METABOLIC PANEL  BETA-HYDROXYBUTYRIC ACID  BASIC METABOLIC PANEL  BETA-HYDROXYBUTYRIC ACID    EKG None  Radiology No results found.  Procedures Procedures (including critical care time)  CRITICAL CARE Performed by: Virgel Manifold Total critical care time: 35 minutes Critical care time was exclusive of separately billable procedures and treating other patients. Critical care was necessary to treat or prevent imminent or life-threatening deterioration. Critical care was time spent personally by me on the following activities: development of treatment plan with patient and/or surrogate as well as nursing, discussions with consultants, evaluation of patient's response to treatment, examination of patient, obtaining history from patient or surrogate, ordering and performing treatments and interventions, ordering and review of laboratory studies, ordering and review of radiographic studies, pulse oximetry and re-evaluation of patient's condition.  Medications Ordered in  ED Medications  insulin regular, human (MYXREDLIN) 100 units/ 100 mL infusion (has no administration in time range)  0.9 %  sodium chloride infusion (has no administration in time range)  dextrose 5 %-0.45 % sodium chloride infusion (has no administration in time range)  dextrose 50 % solution 0-50 mL (has no administration in time range)  potassium chloride 10 mEq in 100 mL IVPB (has no administration in time range)  lactated ringers bolus 1,000 mL (0 mLs Intravenous Stopped 08/31/19 1204)  promethazine (PHENERGAN) injection 12.5 mg (12.5 mg Intravenous Given 08/31/19 1026)  loperamide (IMODIUM) capsule 4 mg (4 mg Oral Given 08/31/19 1118)    Initial Impression / Assessment and Plan / ED Course  I have reviewed the triage vital signs and the nursing notes.  Pertinent labs & imaging results that were available during my care of the patient were reviewed by me and considered in my medical decision making (see chart for details).  63 year old female with nausea, vomiting diarrhea.  Abdominal exam is pretty benign.  Labs are consistent with mild DKA.  Hyperglycemic.  Very mild metabolic acidosis.  Anion gap is mildly elevated as well.  Significant ketones on UA.  She received 1 L normal saline  by EMS.  She was given additional liter of LR here in the emergency room.  Will start insulin drip.  Electrolyte management.  Admission.  Final Clinical Impressions(s) / ED Diagnoses   Final diagnoses:  Diabetic ketoacidosis without coma associated with type 2 diabetes mellitus [facility])    ED Discharge Orders    None       Virgel Manifold, MD 09/01/19 0710

## 2019-08-31 NOTE — ED Notes (Signed)
ED TO INPATIENT HANDOFF REPORT  Name/Age/Gender [name] 63 y.o. female  Code Status Code Status History    Date Active Date Inactive Code Status Order ID Comments User Context   06/22/2019 1607 06/24/2019 1958 Full Code [id number]  Darliss Cheney, MD ED   05/28/2019 1252 05/31/2019 1551 Full Code [id number]  Mckinley Jewel, MD ED   05/15/2019 1228 05/17/2019 1932 Full Code [id number]  Dixie Dials, MD ED   Advance Care Planning Activity      Home/SNF/Other Home  Chief Complaint hyperglycemic n/v/d  Level of Care/Admitting Diagnosis ED Disposition    ED Disposition Condition [place]: [facility] [[id number]]  Level of Care: Stepdown [14]  Admit to SDU based on following criteria: Other see comments  Comments: DKA  Covid Evaluation: Asymptomatic Screening Protocol (No Symptoms)  Diagn[facility]: [facility]acidoses[id number]  Admitting Physician: Truddie Hidden [id number]  Attending Physician: Truddie Hidden [id number]  Estimated length of stay: past midnight tomorrow  Certification:: I certify this patient will need inpatient services for at least 2 midnights  PT Class (Do Not Modify): Inpatient [101]  PT Acc Code (Do Not Modify): Private [1]       Medical History Past Medical History:  Diagnosis Date  . Cancer ([place]) 1977   CERVICAL  . GERD (gastroesophageal reflux disease) 11/14/2013  . Gout   . Mixed hyperlipidemia 11/14/2013    Allergies Allergies  Allergen Reactions  . Gabapentin Other (See Comments)    Caused upset stomach  . Penicillins Hives and Other (See Comments)    Has patient had a PCN reaction causing immediate rash, facial/tongue/throat swelling, SOB or lightheadedness with hypotension: Unknown Has patient had a PCN reaction causing severe rash involving mucus membranes or skin necrosis: Unknown Has patient had a PCN reaction that required hospitalization: Unknown Has patient had a PCN reaction  occurring within the last 10 years: No If all of the above answers are "NO", then may proceed with Cephalosporin use.   Marland Kitchen Pepcid [Famotidine] Other (See Comments)    HEADACHE    IV Location/Drains/Wounds Patient Lines/Drains/Airways Status   Active Line/Drains/Airways    Name:   Placement date:   Placement time:   Site:   Days:   Peripheral IV 08/31/19 Anterior;Right Hand   08/31/19    -    Hand   less than 1   Peripheral IV 08/31/19 Anterior;Left Hand   08/31/19    -    Hand   less than 1   Peripheral IV 08/31/19 Left Forearm   08/31/19    1240    Forearm   less than 1   Peripheral IV 08/31/19 Right Antecubital   08/31/19    1240    Antecubital   less than 1   Incision (Closed) 05/29/19 Back Right   05/29/19    1051     94          Labs/Imaging Results for orders placed or performed during the hospital encounter of 08/31/19 (from the past 48 hour(s))  Comprehensive metabolic panel     Status: Abnormal   Collection Time: 08/31/19  9:49 AM  Result Value Ref Range   Sodium 135 135 - 145 mmol/L   Potassium 3.5 3.5 - 5.1 mmol/L   Chloride 97 (L) 98 - 111 mmol/L   CO2 20 (L) 22 - 32 mmol/L   Glucose, Bld 405 (H) 70 - 99 mg/dL   BUN 27 (H) 8 -  23 mg/dL   Creatinine, Ser 0.98 0.44 - 1.00 mg/dL   Calcium 9.1 8.9 - 10.3 mg/dL   Total Protein 7.4 6.5 - 8.1 g/dL   Albumin 4.0 3.5 - 5.0 g/dL   AST 20 15 - 41 U/L   ALT 21 0 - 44 U/L   Alkaline Phosphatase 103 38 - 126 U/L   Total Bilirubin 1.7 (H) 0.3 - 1.2 mg/dL   GFR calc non Af Amer >60 >60 mL/min   GFR calc Af Amer >60 >60 mL/min   Anion gap 18 (H) 5 - 15    Comment: Performed at [facility], [place] [address], [place], [place] [address]  Lipase, blood     Status: None   Collection Time: 08/31/19  9:49 AM  Result Value Ref Range   Lipase 21 11 - 51 U/L    Comment: Performed at [facility], [place] [address], [place], [place] [address]  Urinalysis, Routine w reflex microscopic     Status:  Abnormal   Collection Time: 08/31/19  9:49 AM  Result Value Ref Range   Color, Urine YELLOW YELLOW   APPearance CLEAR CLEAR   Specific Gravity, Urine 1.021 1.005 - 1.030   pH 5.0 5.0 - 8.0   Glucose, UA >=500 (A) NEGATIVE mg/dL   Hgb urine dipstick MODERATE (A) NEGATIVE   Bilirubin Urine NEGATIVE NEGATIVE   Ketones, ur 80 (A) NEGATIVE mg/dL   Protein, ur >=300 (A) NEGATIVE mg/dL   Nitrite NEGATIVE NEGATIVE   Leukocytes,Ua NEGATIVE NEGATIVE   RBC / HPF 6-10 0 - 5 RBC/hpf   WBC, UA 0-5 0 - 5 WBC/hpf   Bacteria, UA RARE (A) NONE SEEN   Squamous Epithelial / LPF 0-5 0 - 5    Comment: Performed at [facility], [place] [address], [place], [place] [address]  CBC with Differential/Platelet     Status: Abnormal   Collection Time: 08/31/19 11:32 AM  Result Value Ref Range   WBC 11.6 (H) 4.0 - 10.5 K/uL   RBC 4.66 3.87 - 5.11 MIL/uL   Hemoglobin 11.7 (L) 12.0 - 15.0 g/dL   HCT 37.5 36.0 - 46.0 %   MCV 80.5 80.0 - 100.0 fL   MCH 25.1 (L) 26.0 - 34.0 pg   MCHC 31.2 30.0 - 36.0 g/dL   RDW 16.2 (H) 11.5 - 15.5 %   Platelets 199 150 - 400 K/uL   nRBC 0.0 0.0 - 0.2 %   Neutrophils Relative % 70 %   Neutro Abs 8.1 (H) 1.7 - 7.7 K/uL   Lymphocytes Relative 19 %   Lymphs Abs 2.2 0.7 - 4.0 K/uL   Monocytes Relative 5 %   Monocytes Absolute 0.6 0.1 - 1.0 K/uL   Eosinophils Relative 5 %   Eosinophils Absolute 0.5 0.0 - 0.5 K/uL   Basophils Relative 1 %   Basophils Absolute 0.1 0.0 - 0.1 K/uL   Immature Granulocytes 0 %   Abs Immature Granulocytes 0.05 0.00 - 0.07 K/uL    Comment: Performed at [facility], [place] [address] [place], [place] [id number]  Basic metabolic panel     Status: Abnormal   Collection Time: 08/31/19 12:35 PM  Result Value Ref Range   Sodium 136 135 - 145 mmol/L   Potassium 3.8 3.5 - 5.1 mmol/L   Chloride 98 98 - 111 mmol/L   CO2 20 (L) 22 - 32 mmol/L   Glucose, Bld 370 (H) 70 - 99 mg/dL  BUN 23 8 - 23 mg/dL   Creatinine, Ser 0.88  0.44 - 1.00 mg/dL   Calcium 9.2 8.9 - 10.3 mg/dL   GFR calc non Af Amer >60 >60 mL/min   GFR calc Af Amer >60 >60 mL/min   Anion gap 18 (H) 5 - 15    Comment: Performed at [facility], [place] [address], [place], [place] [address]  Beta-hydroxybutyric acid     Status: Abnormal   Collection Time: 08/31/19 12:35 PM  Result Value Ref Range   Beta-Hydroxybutyric Acid 3.22 (H) 0.05 - 0.27 mmol/L    Comment: Performed at [facility], [place] [address], [place], [place] [address]  SARS CORONAVIRUS 2 (TAT 6-24 HRS) Nasopharyngeal Nasopharyngeal Swab     Status: None   Collection Time: 08/31/19 12:35 PM   Specimen: Nasopharyngeal Swab  Result Value Ref Range   SARS Coronavirus 2 NEGATIVE NEGATIVE    Comment: (NOTE) SARS-CoV-2 target nucleic acids are NOT DETECTED. The SARS-CoV-2 RNA is generally detectable in upper and lower respiratory specimens during the acute phase of infection. Negative results do not preclude SARS-CoV-2 infection, do not rule out co-infections with other pathogens, and should not be used as the sole basis for treatment or other patient management decisions. Negative results must be combined with clinical observations, patient history, and epidemiological information. The expected result is Negative. Fact Sheet for Patients: SugarRoll.be Fact Sheet for Healthcare Providers: https://www.woods-mathews.com/ This test is not yet approved or cleared by the [place] FDA and  has been authorized for detection and/or diagnosis of SARS-CoV-2 by FDA under an Emergency Use Authorization (EUA). This EUA will remain  in effect (meaning this test can be used) for the duration of the COVID-19 declaration under Section 56 4(b)(1) of the Act, 21 U.S.C. section 360bbb-3(b)(1), unless the authorization is terminated or revoked sooner. Performed at [facility], [place] [address], [place],  [place] [address]   CBG monitoring, ED     Status: Abnormal   Collection Time: 08/31/19 12:46 PM  Result Value Ref Range   Glucose-Capillary 320 (H) 70 - 99 mg/dL  CBG monitoring, ED     Status: Abnormal   Collection Time: 08/31/19  1:42 PM  Result Value Ref Range   Glucose-Capillary 299 (H) 70 - 99 mg/dL   Comment 1 Notify RN   CBG monitoring, ED     Status: Abnormal   Collection Time: 08/31/19  3:09 PM  Result Value Ref Range   Glucose-Capillary 150 (H) 70 - 99 mg/dL  CBG monitoring, ED     Status: Abnormal   Collection Time: 08/31/19  4:42 PM  Result Value Ref Range   Glucose-Capillary 171 (H) 70 - 99 mg/dL  Basic metabolic panel     Status: Abnormal   Collection Time: 08/31/19  4:50 PM  Result Value Ref Range   Sodium 136 135 - 145 mmol/L   Potassium 3.4 (L) 3.5 - 5.1 mmol/L   Chloride 103 98 - 111 mmol/L   CO2 22 22 - 32 mmol/L   Glucose, Bld 248 (H) 70 - 99 mg/dL   BUN 20 8 - 23 mg/dL   Creatinine, Ser 0.83 0.44 - 1.00 mg/dL   Calcium 8.4 (L) 8.9 - 10.3 mg/dL   GFR calc non Af Amer >60 >60 mL/min   GFR calc Af Amer >60 >60 mL/min   Anion gap 11 5 - 15    Comment: Performed at [facility], [place] [address]  [place], [place] [address]  CBG monitoring, ED     Status: Abnormal   Collection Time: 08/31/19  6:02 PM  Result Value Ref Range   Glucose-Capillary 174 (H) 70 - 99 mg/dL  CBG monitoring, ED     Status: Abnormal   Collection Time: 08/31/19  7:06 PM  Result Value Ref Range   Glucose-Capillary 157 (H) 70 - 99 mg/dL  CBG monitoring, ED     Status: Abnormal   Collection Time: 08/31/19  8:34 PM  Result Value Ref Range   Glucose-Capillary 129 (H) 70 - 99 mg/dL   No results found.  Pending Labs Unresulted Labs (From admission, onward)    Start     Ordered   08/31/19 [id number]  Basic metabolic panel  (Diabetes Ketoacidosis (DKA))  STAT Now then every 4 hours ,   STAT     08/31/19 1202   08/31/19 1202  Beta-hydroxybutyric acid  (Diabetes Ketoacidosis (DKA))   Now then every 8 hours,   R (with STAT occurrences)     08/31/19 1202   08/31/19 0949  CBC with Differential  ONCE - STAT,   STAT     08/31/19 0948   Signed and Held  CBC  (enoxaparin (LOVENOX)    CrCl >/= 30 ml/min)  Once,   R    Comments: Baseline for enoxaparin therapy IF NOT ALREADY DRAWN.  Notify MD if PLT < 100 K.    Signed and Held   Signed and Held  Creatinine, serum  (enoxaparin (LOVENOX)    CrCl >/= 30 ml/min)  Once,   R    Comments: Baseline for enoxaparin therapy IF NOT ALREADY DRAWN.    Signed and Held   Signed and Held  Creatinine, serum  (enoxaparin (LOVENOX)    CrCl >/= 30 ml/min)  Weekly,   R    Comments: while on enoxaparin therapy    Signed and Held   Signed and Held  Basic metabolic panel  (Diabetes Ketoacidosis (DKA))  STAT Now then every 4 hours ,   STAT     Signed and Held   Signed and Held  Beta-hydroxybutyric acid  (Diabetes Ketoacidosis (DKA))  Now then every 8 hours,   R     Signed and Held   Signed and Held  Hemoglobin A1c  (Diabetes Ketoacidosis (DKA))  Once,   R    Comments: To assess prior glycemic control.    Signed and Held          Vitals/Pain Today's Vitals   08/31/19 1600 08/31/19 1700 08/31/19 1800 08/31/19 1900  BP: (!) 136/55 (!) 145/66 (!) 114/54 (!) 148/66  Pulse: 77 83 73 73  Resp: 17  16 14   Temp:      TempSrc:      SpO2: 97% 98% 96% 96%  PainSc:        Isolation Precautions No active isolations  Medications Medications  insulin regular, human (MYXREDLIN) 100 units/ 100 mL infusion (2.4 Units/hr Intravenous Rate/Dose Change 08/31/19 1923)  0.9 %  sodium chloride infusion ( Intravenous New Bag/Given 08/31/19 1225)  dextrose 5 %-0.45 % sodium chloride infusion ( Intravenous New Bag/Given 08/31/19 1515)  dextrose 50 % solution 0-50 mL (has no administration in time range)  potassium chloride 10 mEq in 100 mL IVPB (0 mEq Intravenous Stopped 08/31/19 1649)  0.9 %  sodium chloride infusion ( Intravenous New Bag/Given 08/31/19 1256)   ondansetron (ZOFRAN) injection 4 mg (4 mg Intravenous Given 08/31/19 1956)  insulin NPH Human (NOVOLIN  N) injection 7 Units (has no administration in time range)  insulin NPH Human (NOVOLIN N) injection 6 Units (has no administration in time range)  lactated ringers bolus 1,000 mL (0 mLs Intravenous Stopped 08/31/19 1204)  promethazine (PHENERGAN) injection 12.5 mg (12.5 mg Intravenous Given 08/31/19 1026)  loperamide (IMODIUM) capsule 4 mg (4 mg Oral Given 08/31/19 1118)  potassium chloride (KLOR-CON) packet 60 mEq (60 mEq Oral Given 08/31/19 1929)    Mobility walks

## 2019-08-31 NOTE — ED Triage Notes (Signed)
Patient presented from home with complain of nausea/vomiting and diarrhea. Patient denies fever,cough. Patient blood sugar was 566 per ems. Patient was given 1 litre of fluids and Zofran mg . Recheck blood sugar per ems was 363.

## 2019-08-31 NOTE — Progress Notes (Signed)
Inpatient Diabetes Program Recommendations  AACE/ADA: New Consensus Statement on Inpatient Glycemic Control (2015)  Target Ranges:  Prepandial:   less than 140 mg/dL      Peak postprandial:   less than 180 mg/dL (1-2 hours)      Critically ill patients:  140 - 180 mg/dL   Lab Results  Component Value Date   GLUCAP 171 (H) 08/31/2019   HGBA1C 9.0 (H) 05/28/2019    Review of Glycemic Control  Diabetes history: DM1 Outpatient Diabetes medications: 70/30 7-2-7 units tidwc. Decrease to 3 units if blood sugar 100-140 mg/dL Current orders for Inpatient glycemic control: IV insulin per [facility]  Inpatient Diabetes Program Recommendations:     Continue DKA management with [facility] until criteria met for transition to SQ.  Spoke to pt in ED regarding her glucose control at home. Pt states she hasn't taken her insulin for the past 1-2 days d/t nausea, vomiting. States her husband does not know how to give her insulin, but she will be teaching him.   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 Questions answered.  Thank you. Lorenda Peck, RD, LDN, CDE Inpatient Diabetes Coordinator [phone]

## 2019-09-01 DIAGNOSIS — E1169 Type 2 diabetes mellitus with other specified complication: Secondary | ICD-10-CM

## 2019-09-01 DIAGNOSIS — K219 Gastro-esophageal reflux disease without esophagitis: Secondary | ICD-10-CM | POA: Diagnosis not present

## 2019-09-01 DIAGNOSIS — I10 Essential (primary) hypertension: Secondary | ICD-10-CM

## 2019-09-01 DIAGNOSIS — E1110 Type 2 diabetes mellitus with ketoacidosis without coma: Principal | ICD-10-CM

## 2019-09-01 DIAGNOSIS — I480 Paroxysmal atrial fibrillation: Secondary | ICD-10-CM

## 2019-09-01 DIAGNOSIS — Z9081 Acquired absence of spleen: Secondary | ICD-10-CM

## 2019-09-01 DIAGNOSIS — Z794 Long term (current) use of insulin: Secondary | ICD-10-CM

## 2019-09-01 DIAGNOSIS — R197 Diarrhea, unspecified: Secondary | ICD-10-CM

## 2019-09-01 DIAGNOSIS — R112 Nausea with vomiting, unspecified: Secondary | ICD-10-CM | POA: Diagnosis present

## 2019-09-01 DIAGNOSIS — E559 Vitamin D deficiency, unspecified: Secondary | ICD-10-CM

## 2019-09-01 DIAGNOSIS — E782 Mixed hyperlipidemia: Secondary | ICD-10-CM

## 2019-09-01 DIAGNOSIS — E860 Dehydration: Secondary | ICD-10-CM

## 2019-09-01 LAB — BETA-HYDROXYBUTYRIC ACID
Beta-Hydroxybutyric Acid: 1.28 mmol/L — ABNORMAL HIGH (ref 0.05–0.27)
Beta-Hydroxybutyric Acid: 1.12 mmol/L — ABNORMAL HIGH (ref 0.05–0.27)

## 2019-09-01 LAB — BASIC METABOLIC PANEL
Anion gap: 9 (ref 5–15)
Anion gap: 12 (ref 5–15)
Anion gap: 11 (ref 5–15)
BUN: 21 mg/dL (ref 8–23)
BUN: 20 mg/dL (ref 8–23)
BUN: 20 mg/dL (ref 8–23)
CO2: 22 mmol/L (ref 22–32)
CO2: 21 mmol/L — ABNORMAL LOW (ref 22–32)
CO2: 22 mmol/L (ref 22–32)
Calcium: 8.5 mg/dL — ABNORMAL LOW (ref 8.9–10.3)
Calcium: 9.2 mg/dL (ref 8.9–10.3)
Calcium: 9.1 mg/dL (ref 8.9–10.3)
Chloride: 107 mmol/L (ref 98–111)
Chloride: 106 mmol/L (ref 98–111)
Chloride: 105 mmol/L (ref 98–111)
Creatinine, Ser: 0.91 mg/dL (ref 0.44–1.00)
Creatinine, Ser: 0.97 mg/dL (ref 0.44–1.00)
Creatinine, Ser: 0.93 mg/dL (ref 0.44–1.00)
GFR calc Af Amer: >60 mL/min (ref >60)
GFR calc Af Amer: >60 mL/min (ref >60)
GFR calc Af Amer: >60 mL/min (ref >60)
GFR calc non Af Amer: >60 mL/min (ref >60)
GFR calc non Af Amer: >60 mL/min (ref >60)
GFR calc non Af Amer: >60 mL/min (ref >60)
Glucose, Bld: 117 mg/dL — ABNORMAL HIGH (ref 70–99)
Glucose, Bld: 125 mg/dL — ABNORMAL HIGH (ref 70–99)
Glucose, Bld: 134 mg/dL — ABNORMAL HIGH (ref 70–99)
Potassium: 3.9 mmol/L (ref 3.5–5.1)
Potassium: 3.7 mmol/L (ref 3.5–5.1)
Potassium: 3.8 mmol/L (ref 3.5–5.1)
Sodium: 138 mmol/L (ref 135–145)
Sodium: 139 mmol/L (ref 135–145)
Sodium: 138 mmol/L (ref 135–145)

## 2019-09-01 LAB — GLUCOSE, CAPILLARY
Glucose-Capillary: 103 mg/dL — ABNORMAL HIGH (ref 70–99)
Glucose-Capillary: 105 mg/dL — ABNORMAL HIGH (ref 70–99)
Glucose-Capillary: 115 mg/dL — ABNORMAL HIGH (ref 70–99)
Glucose-Capillary: 97 mg/dL (ref 70–99)
Glucose-Capillary: 121 mg/dL — ABNORMAL HIGH (ref 70–99)
Glucose-Capillary: 226 mg/dL — ABNORMAL HIGH (ref 70–99)
Glucose-Capillary: 249 mg/dL — ABNORMAL HIGH (ref 70–99)
Glucose-Capillary: 187 mg/dL — ABNORMAL HIGH (ref 70–99)

## 2019-09-01 LAB — MRSA PCR SCREENING: MRSA by PCR: NEGATIVE

## 2019-09-01 MED ORDER — INSULIN NPH (HUMAN) (ISOPHANE) 100 UNIT/ML ~~LOC~~ SUSP
2.0000 [IU] | Freq: Every day | SUBCUTANEOUS | Status: DC
  Administered 2019-09-01: 2 [IU] via SUBCUTANEOUS

## 2019-09-01 MED ORDER — INSULIN ASPART PROT & ASPART (70-30 MIX) 100 UNIT/ML ~~LOC~~ SUSP
7.0000 [IU] | Freq: Two times a day (BID) | SUBCUTANEOUS | Status: DC
  Administered 2019-09-01 – 2019-09-02 (×2): 7 [IU] via SUBCUTANEOUS
  Filled 2019-09-01: qty 10

## 2019-09-01 MED ORDER — INSULIN ASPART PROT & ASPART (70-30 MIX) 100 UNIT/ML ~~LOC~~ SUSP
2.0000 [IU] | Freq: Every day | SUBCUTANEOUS | Status: DC
  Administered 2019-09-02: 2 [IU] via SUBCUTANEOUS

## 2019-09-01 MED ORDER — AMLODIPINE BESYLATE 5 MG PO TABS
5.0000 mg | ORAL_TABLET | Freq: Every day | ORAL | Status: DC
  Administered 2019-09-01: 5 mg via ORAL
  Filled 2019-09-01: qty 1

## 2019-09-01 MED ORDER — LOSARTAN POTASSIUM 50 MG PO TABS
25.0000 mg | ORAL_TABLET | Freq: Every day | ORAL | Status: DC
  Administered 2019-09-01 – 2019-09-02 (×2): 25 mg via ORAL
  Filled 2019-09-01 (×2): qty 1

## 2019-09-01 MED ORDER — METOPROLOL SUCCINATE ER 25 MG PO TB24
25.0000 mg | ORAL_TABLET | Freq: Every day | ORAL | Status: DC
  Administered 2019-09-01 – 2019-09-02 (×2): 25 mg via ORAL
  Filled 2019-09-01 (×2): qty 1

## 2019-09-01 NOTE — Progress Notes (Addendum)
PROGRESS NOTE    [name]  [id number] DOB: 11-01-1955 DOA: 08/31/2019 PCP: Leeroy Cha, MD    Brief Narrative:  HPI per Dr. Heron Patel [name] is a 63 y.o. female with medical history significant of HTN, HLD, IDDM, Paroxysmal A. Fib on Apixabam, GERD, Remote hx of botulism per patient, hx of splenectomy, partial pancreatectomy, Gout and hx of cervical ca who presented with nausea/vomiting/diarrhea.  Patient reported she was in [place] thru Sunday night, when she had salmon for dinner.  She reported she woke up in the middle of the morning around Monday 3:30 am with nausea/vomiting/adbominal pain and diarrhea.  She stated since then she has been having frequent stools and emesis and has been unable to keep down any food or meds.  She denied any fevers or other localizing symptoms - no dysuria, cough/sob, evidence of cellulitis.  She denies any tobacco, alcohol or drugs.  Assessment & Plan:   Principal Problem:   DKA (diabetic ketoacidoses) ([place]) Active Problems:   H/O splenectomy   GERD (gastroesophageal reflux disease)   Essential hypertension   Mixed hyperlipidemia   Vitamin D deficiency   PAF (paroxysmal atrial fibrillation) (HCC)   DM (diabetes mellitus), type 2 (HCC)   Nausea vomiting and diarrhea   Dehydration   1 DKA/type 2 diabetes mellitus poorly controlled (hemoglobin A1c 12.8--08/31/2019) Patient had presented with nausea vomiting diarrhea after eating salmon the night prior to admission.  Concern for possible gastric viral enteritis.  Patient had presented on the day of admission with a blood glucose level of 4, bicarb of 20, anion gap of 18, serum beta hydroxy uric acid elevated at 3.22 on admission, urinalysis with greater than 500 glucose, 80 ketones, greater than 300 protein.  Patient was placed on the glucose stabilizer.  Blood glucose levels improved.  Anion gap closed and currently at 11.  Bicarbonate 20.  Patient has been transitioned back to  home regimen of NPH 6 units before breakfast, 7 units at bedtime.  Continue IV fluids.  Advance diet to a carb modified diet.  Add back home regimen of 70/30 2 units at lunchtime.  Diabetes coordinator following.  Follow.  2.  Nausea/ vomiting/ diarrhea Questionable etiology.  May be secondary to acute viral gastroenteritis versus problem #1 vs food poisoning.  Improved clinically.  Patient will be attempted on a diet this morning.  Continue IV fluids.  Follow.  3.  Dehydration IV fluids.  4.  Paroxysmal atrial fibrillation Resume metoprolol for rate control.  Eliquis for anticoagulation.  5.  Hypertension Patient's antihypertensive medications held on admission due to dehydration and nausea and vomiting.  Patient improving clinically.  Will resume home regimen of Norvasc, Cozaar, metoprolol.  Follow.  6.  Gastroesophageal reflux disease PPI.  7.  Hyperlipidemia Resume Lipitor.  8.  History of splenectomy Patient has not received Pneumovax will need Pneumovax during this hospitalization prior to discharge.  Outpatient follow-up with PCP if patient has not received H. influenzae type B vaccine and meningococcal vaccine.  9.  Vitamin D deficiency Resume home regimen of oral vitamin D supplementation.  DVT prophylaxis: Eliquis Code Status: Full Family Communication: Updated patient.  No family at bedside. Disposition Plan: Likely home when clinically improved, no further nausea vomiting or diarrhea tolerating oral intake.   Consultants:   None  Procedures:  None  Antimicrobials:  None   Subjective: Patient sitting up in bed.  Denies any chest pain or shortness of breath.  States last bout of emesis was  last night around 11 PM.  No further diarrhea.  Feeling a little bit better than she did however not at baseline.  States she has had a lot going on.  Patient denies any further abdominal pain.  Objective: Vitals:   09/01/19 0531 09/01/19 0826 09/01/19 0832 09/01/19 0925    BP: (!) 129/102 (!) 178/70  (!) 165/82  Pulse: 73 74  78  Resp: 14 13  (!) 22  Temp:   98.5 F (36.9 C)   TempSrc:   Oral   SpO2: 96% 100%  98%  Weight:   56.9 kg   Height:   5' (1.524 m)     Intake/Output Summary (Last 24 hours) at 09/01/2019 1030 Last data filed at 09/01/2019 0900 Gross per 24 hour  Intake 1282.56 ml  Output 625 ml  Net 657.56 ml   Filed Weights   09/01/19 0832  Weight: 56.9 kg    Examination:  General exam: Appears calm and comfortable. Dry mucous membranes. Respiratory system: Clear to auscultation. Respiratory effort normal. Cardiovascular system: S1 & S2 heard, RRR. No JVD, murmurs, rubs, gallops or clicks. No pedal edema. Gastrointestinal system: Abdomen is nondistended, soft and nontender. No organomegaly or masses felt. Normal bowel sounds heard. Central nervous system: Alert and oriented. No focal neurological deficits. Extremities: Symmetric 5 x 5 power. Skin: No rashes, lesions or ulcers Psychiatry: Judgement and insight appear normal. Mood & affect appropriate.     Data Reviewed: I have personally reviewed following labs and imaging studies  CBC: Recent Labs  Lab 08/31/19 1132  WBC 11.6*  NEUTROABS 8.1*  HGB 11.7*  HCT 37.5  MCV 80.5  PLT 123XX123   Basic Metabolic Panel: Recent Labs  Lab 08/31/19 1650 08/31/19 2200 09/01/19 0226 09/01/19 0608 09/01/19 0820  NA 136 138 138 139 138  K 3.4* 4.2 3.9 3.7 3.8  CL 103 106 107 106 105  CO2 22 23 22  21* 22  GLUCOSE 248* 106* 117* 125* 134*  BUN 20 20 21 20 20   CREATININE 0.83 0.95 0.91 0.97 0.93  CALCIUM 8.4* 8.7* 8.5* 9.2 9.1   GFR: Estimated Creatinine Clearance: 47.7 mL/min (by C-G formula based on SCr of 0.93 mg/dL). Liver Function Tests: Recent Labs  Lab 08/31/19 0949  AST 20  ALT 21  ALKPHOS 103  BILITOT 1.7*  PROT 7.4  ALBUMIN 4.0   Recent Labs  Lab 08/31/19 0949  LIPASE 21   No results for input(s): AMMONIA in the last 168 hours. Coagulation Profile: No  results for input(s): INR, PROTIME in the last 168 hours. Cardiac Enzymes: No results for input(s): CKTOTAL, CKMB, CKMBINDEX, TROPONINI in the last 168 hours. BNP (last 3 results) No results for input(s): PROBNP in the last 8760 hours. HbA1C: Recent Labs    08/31/19 2206  HGBA1C 12.8*   CBG: Recent Labs  Lab 08/31/19 2212 08/31/19 2320 09/01/19 0021 09/01/19 0315 09/01/19 0747  GLUCAP 97 103* 115* 105* 121*   Lipid Profile: No results for input(s): CHOL, HDL, LDLCALC, TRIG, CHOLHDL, LDLDIRECT in the last 72 hours. Thyroid Function Tests: No results for input(s): TSH, T4TOTAL, FREET4, T3FREE, THYROIDAB in the last 72 hours. Anemia Panel: No results for input(s): VITAMINB12, FOLATE, FERRITIN, TIBC, IRON, RETICCTPCT in the last 72 hours. Sepsis Labs: No results for input(s): PROCALCITON, LATICACIDVEN in the last 168 hours.  Recent Results (from the past 240 hour(s))  SARS CORONAVIRUS 2 (TAT 6-24 HRS) Nasopharyngeal Nasopharyngeal Swab     Status: None   Collection Time: 08/31/19  12:35 PM   Specimen: Nasopharyngeal Swab  Result Value Ref Range Status   SARS Coronavirus 2 NEGATIVE NEGATIVE Final    Comment: (NOTE) SARS-CoV-2 target nucleic acids are NOT DETECTED. The SARS-CoV-2 RNA is generally detectable in upper and lower respiratory specimens during the acute phase of infection. Negative results do not preclude SARS-CoV-2 infection, do not rule out co-infections with other pathogens, and should not be used as the sole basis for treatment or other patient management decisions. Negative results must be combined with clinical observations, patient history, and epidemiological information. The expected result is Negative. Fact Sheet for Patients: SugarRoll.be Fact Sheet for Healthcare Providers: https://www.woods-mathews.com/ This test is not yet approved or cleared by the [place] FDA and  has been authorized for detection  and/or diagnosis of SARS-CoV-2 by FDA under an Emergency Use Authorization (EUA). This EUA will remain  in effect (meaning this test can be used) for the duration of the COVID-19 declaration under Section 56 4(b)(1) of the Act, 21 U.S.C. section 360bbb-3(b)(1), unless the authorization is terminated or revoked sooner. Performed at [facility], [place] [address] [place], [place] [address]   MRSA PCR Screening     Status: None   Collection Time: 09/01/19  1:36 AM   Specimen: Nasopharyngeal  Result Value Ref Range Status   MRSA by PCR NEGATIVE NEGATIVE Final    Comment:        The GeneXpert MRSA Assay (FDA approved for NASAL specimens only), is one component of a comprehensive MRSA colonization surveillance program. It is not intended to diagnose MRSA infection nor to guide or monitor treatment for MRSA infections. Performed at [facility], [place] [address] [place], [place] [address]          Radiology Studies: No results found.      Scheduled Meds:  amLODipine  5 mg Oral Daily   apixaban  5 mg Oral BID   atorvastatin  10 mg Oral q1800   Chlorhexidine Gluconate Cloth  6 each Topical Daily   cholecalciferol  5,000 Units Oral QPM   insulin NPH Human  6 Units Subcutaneous QAC breakfast   insulin NPH Human  7 Units Subcutaneous QHS   losartan  25 mg Oral Daily   metoprolol succinate  25 mg Oral Daily   pantoprazole  40 mg Oral Daily   pregabalin  75 mg Oral QHS   Continuous Infusions:  sodium chloride Stopped (08/31/19 1529)   sodium chloride 75 mL/hr at 09/01/19 0900   dextrose 5 % and 0.45% NaCl Stopped (09/01/19 0023)   dextrose 5 % and 0.45% NaCl Stopped (08/31/19 2220)   insulin Stopped (09/01/19 0023)     LOS: 1 day    Time spent: 40 mins    Irine Seal, MD [facility]  If 7PM-7AM, please contact night-coverage [email or web address] 09/01/2019, 10:30 AM

## 2019-09-01 NOTE — Progress Notes (Signed)
NUTRITION NOTE  Message received from RN for patient who has diet-related questions. Patient is currently on Carb Modified diet. She has hx of splenectomy and partial pancreatectomy d/t botulism in 2000.  Patient in bed with husband at bedside. They are difficult to re-direct as they often get off topic. Provided patient and husband with several handouts concerning nutrition label reading, sodium, and carbohydrates/glycemic control for DM. Reviewed these in some detail but through conversation it seems that patient does not understand nutrition-related basics (for example often getting confused concerning the difference between carb and protein).   Patient would likely benefit from outpatient nutrition counseling in order to have ongoing conversations about nutrition-related topics.     Jarome Matin, MS, RD, LDN, [facility] Inpatient Clinical Dietitian Pager # [phone] After hours/weekend pager # [phone]

## 2019-09-02 DIAGNOSIS — E119 Type 2 diabetes mellitus without complications: Secondary | ICD-10-CM

## 2019-09-02 DIAGNOSIS — E860 Dehydration: Secondary | ICD-10-CM | POA: Diagnosis not present

## 2019-09-02 DIAGNOSIS — I10 Essential (primary) hypertension: Secondary | ICD-10-CM | POA: Diagnosis not present

## 2019-09-02 DIAGNOSIS — E1110 Type 2 diabetes mellitus with ketoacidosis without coma: Secondary | ICD-10-CM | POA: Diagnosis not present

## 2019-09-02 LAB — CBC WITH DIFFERENTIAL/PLATELET
Abs Immature Granulocytes: 0.01 10*3/uL (ref 0.00–0.07)
Basophils Absolute: 0.2 10*3/uL — ABNORMAL HIGH (ref 0.0–0.1)
Basophils Relative: 2 %
Eosinophils Absolute: 0.2 10*3/uL (ref 0.0–0.5)
Eosinophils Relative: 2 %
HCT: 35.3 % — ABNORMAL LOW (ref 36.0–46.0)
Hemoglobin: 10.8 g/dL — ABNORMAL LOW (ref 12.0–15.0)
Immature Granulocytes: 0 %
Lymphocytes Relative: 60 %
Lymphs Abs: 6.9 10*3/uL — ABNORMAL HIGH (ref 0.7–4.0)
MCH: 24.9 pg — ABNORMAL LOW (ref 26.0–34.0)
MCHC: 30.6 g/dL (ref 30.0–36.0)
MCV: 81.5 fL (ref 80.0–100.0)
Monocytes Absolute: 0.8 10*3/uL (ref 0.1–1.0)
Monocytes Relative: 7 %
Neutro Abs: 3.3 10*3/uL (ref 1.7–7.7)
Neutrophils Relative %: 29 %
Platelets: 182 10*3/uL (ref 150–400)
RBC: 4.33 MIL/uL (ref 3.87–5.11)
RDW: 16.6 % — ABNORMAL HIGH (ref 11.5–15.5)
WBC: 11.4 10*3/uL — ABNORMAL HIGH (ref 4.0–10.5)
nRBC: 0 % (ref 0.0–0.2)

## 2019-09-02 LAB — GLUCOSE, CAPILLARY
Glucose-Capillary: 157 mg/dL — ABNORMAL HIGH (ref 70–99)
Glucose-Capillary: 220 mg/dL — ABNORMAL HIGH (ref 70–99)

## 2019-09-02 LAB — BASIC METABOLIC PANEL
Anion gap: 8 (ref 5–15)
BUN: 17 mg/dL (ref 8–23)
CO2: 23 mmol/L (ref 22–32)
Calcium: 8.7 mg/dL — ABNORMAL LOW (ref 8.9–10.3)
Chloride: 111 mmol/L (ref 98–111)
Creatinine, Ser: 0.88 mg/dL (ref 0.44–1.00)
GFR calc Af Amer: >60 mL/min (ref >60)
GFR calc non Af Amer: >60 mL/min (ref >60)
Glucose, Bld: 146 mg/dL — ABNORMAL HIGH (ref 70–99)
Potassium: 3.8 mmol/L (ref 3.5–5.1)
Sodium: 142 mmol/L (ref 135–145)

## 2019-09-02 MED ORDER — LOSARTAN POTASSIUM 50 MG PO TABS: 50.0000 mg | ORAL_TABLET | Freq: Every day | ORAL | 1 refills | Status: AC

## 2019-09-02 MED ORDER — LOSARTAN POTASSIUM 50 MG PO TABS: 50.0000 mg | ORAL_TABLET | Freq: Every day | ORAL | Status: DC

## 2019-09-02 MED ORDER — LOSARTAN POTASSIUM 50 MG PO TABS: 50.0000 mg | ORAL_TABLET | Freq: Every day | ORAL | 1 refills | Status: DC

## 2019-09-02 MED ORDER — AMLODIPINE BESYLATE 10 MG PO TABS
10.0000 mg | ORAL_TABLET | Freq: Every day | ORAL | Status: DC
  Administered 2019-09-02: 10 mg via ORAL
  Filled 2019-09-02: qty 1

## 2019-09-02 MED ORDER — LOSARTAN POTASSIUM 50 MG PO TABS
25.0000 mg | ORAL_TABLET | Freq: Once | ORAL | Status: AC
  Administered 2019-09-02: 25 mg via ORAL
  Filled 2019-09-02: qty 1

## 2019-09-02 MED ORDER — AMLODIPINE BESYLATE 10 MG PO TABS: 10.0000 mg | ORAL_TABLET | Freq: Every day | ORAL | 1 refills | Status: DC

## 2019-09-02 NOTE — Discharge Summary (Signed)
Physician Discharge Summary  [name] YQM:[id number] DOB: 1956-09-08 DOA: 08/31/2019  PCP: Caitlyn Cha, MD  Admit date: 08/31/2019 Discharge date: 09/02/2019  Time spent: 60 minutes  Recommendations for Outpatient Follow-up:  1. Follow-up with Dr. Buddy Duty, MD, endocrinology as previously scheduled. 2. Follow-up with Caitlyn Cha, MD in 2 weeks.  On follow-up patient's blood pressure need to be reassessed.  Patient will need a basic metabolic profile to follow-up on electrolytes and renal function.   Discharge Diagnoses:  Principal Problem:   DKA (diabetic ketoacidoses) ([place]) Active Problems:   H/O splenectomy   GERD (gastroesophageal reflux disease)   Essential hypertension   Mixed hyperlipidemia   Vitamin D deficiency   PAF (paroxysmal atrial fibrillation) (HCC)   DM (diabetes mellitus), type 2 (HCC)   Nausea vomiting and diarrhea   Dehydration   Discharge Condition: Stable and improved  Diet recommendation: Carb modified diet  Filed Weights   09/01/19 0832  Weight: 56.9 kg    History of present illness:  HPI per Dr. Caesar Patel is a 63 y.o. female with medical history significant of HTN, HLD, IDDM, Paroxysmal A. Fib on Apixabam, GERD, Remote hx of botulism per patient, hx of splenectomy, partial pancreatectomy, Gout and hx of cervical ca who presents with nausea/vomiting/diarrhea.  Patient reports she was in [place] thru Sunday night, when she had salmon for dinner.  She reports she woke up in the middle of the morning around Monday 3:30 am with nausea/vomiting/adbominal pain and diarrhea.  She states since then she has been having frequent stools and emesis and has been unable to keep down any food or meds.  She denies any fevers or other localizing symptoms - no dysuria, cough/sob, evidence of cellulitis.  She denied any tobacco, alcohol or drugs.  Hospital Course:  1 DKA/type 2 diabetes mellitus poorly controlled (hemoglobin A1c  12.8--08/31/2019) Patient had presented with nausea vomiting diarrhea after eating salmon the night prior to admission.  Concern for possible gastric viral enteritis.  Patient had presented on the day of admission with a blood glucose level of 405, bicarb of 20, anion gap of 18, serum beta hydroxybuteric acid elevated at 3.22 on admission, urinalysis with greater than 500 glucose, 80 ketones, greater than 300 protein.  Patient was placed on the glucose stabilizer, aggressive IV fluid resuscitation, bowel rest. Blood glucose levels improved.  Anion gap closed, bicarb improved.  Patient subsequently transition back to home regimen of 70/30 7 units twice daily and 2 units at lunchtime.  Patient was started on a carb modified diet which she tolerated.  Patient was followed by diabetic coordinator.  Patient improved clinically will be discharged home in stable and improved condition.  Outpatient follow-up with PCP and endocrinologist as scheduled.   2.  Nausea/ vomiting/ diarrhea Questionable etiology.  May be secondary to acute viral gastroenteritis versus problem #1 vs food poisoning.  Patient was initially made n.p.o.  Hydrate with IV fluids, placed on glucose stabilizer and DKA  corrected.  Patient improved clinically and started on a clear liquid diet and diet advanced to a carb modified diet which he tolerated.  Patient had no further episodes of nausea vomiting and diarrhea and will be discharged home in stable and improved condition.    3.  Dehydration Patient hydrated with IV fluids and was euvolemic by day of discharge.   4.  Paroxysmal atrial fibrillation Patient was placed back on home regimen of metoprolol for rate control.  Patient maintained on Eliquis for anticoagulation.  5.  Hypertension Patient's antihypertensive medications held on admission due to dehydration and nausea and vomiting.    As patient improved clinically patient was placed back on home regimen of Norvasc and dose  increased to 10 mg daily.  Patient was maintained on home regimen of metoprolol.  Patient's home dose Cozaar was increased to 50 mg daily with better blood pressure control.  Outpatient follow-up with PCP.   6.  Gastroesophageal reflux disease Patient was maintained on a PPI.  7.  Hyperlipidemia Patient was maintained on home regimen statin.  Outpatient follow-up.  Patient improved History of splenectomy Patient will need outpatient follow-up with PCP and may need Pneumovax if she has not received it as well as H influenza type B vaccine and meningococcal vaccine if patient has not received this.  Outpatient follow-up with PCP.   9.  Vitamin D deficiency Patient was placed back on home regimen of oral vitamin D supplementation which should be discharged home on.  Outpatient follow-up.   Procedures:  None  Consultations:  None  Discharge Exam: Vitals:   09/02/19 0902 09/02/19 1000  BP: (!) 157/63 (!) 153/53  Pulse:  75  Resp:  18  Temp:    SpO2:  98%    General: NAD Cardiovascular: RRR Respiratory: CTAB  Discharge Instructions   Discharge Instructions    Diet Carb Modified   Complete by: As directed    Increase activity slowly   Complete by: As directed      Allergies as of 09/02/2019      Reactions   Gabapentin Other (See Comments)   Caused upset stomach   Penicillins Hives, Other (See Comments)   Has patient had a PCN reaction causing immediate rash, facial/tongue/throat swelling, SOB or lightheadedness with hypotension: Unknown Has patient had a PCN reaction causing severe rash involving mucus membranes or skin necrosis: Unknown Has patient had a PCN reaction that required hospitalization: Unknown Has patient had a PCN reaction occurring within the last 10 years: No If all of the above answers are "NO", then may proceed with Cephalosporin use.   Pepcid [famotidine] Other (See Comments)   HEADACHE      Medication List    TAKE these medications    amLODipine 10 MG tablet Commonly known as: NORVASC Take 1 tablet (10 mg total) by mouth daily. Start taking on: September 03, 2019 What changed:   medication strength  how much to take   apixaban 5 MG Tabs tablet Commonly known as: ELIQUIS Take 1 tablet (5 mg total) by mouth 2 (two) times daily.   atorvastatin 10 MG tablet Commonly known as: LIPITOR Take 10 mg by mouth daily at 6 PM.   clonazePAM 1 MG tablet Commonly known as: KLONOPIN Take 0.5 mg by mouth daily as needed for anxiety.   furosemide 20 MG tablet Commonly known as: LASIX Take 20 mg by mouth daily as needed for fluid.   glucose blood test strip Commonly known as: Electronics engineer BLOOD SUGAR 3 TIMES DAILY-DX-E11.9 AND Z79.4.   insulin NPH-regular Human (70-30) 100 UNIT/ML injection Inject 5-6 Units into the skin See admin instructions. Take 6 units in the morning, and 5 units at night. Hold insulin if blood sugar is less than 99 mg.  Decrease to 3 units if blood sugar is 100 to 140 mg. What changed: additional instructions   losartan 50 MG tablet Commonly known as: COZAAR Take 1 tablet (50 mg total) by mouth daily. What changed:   medication strength  how  much to take   metoprolol succinate 25 MG 24 hr tablet Commonly known as: TOPROL-XL Take 1 tablet (25 mg total) by mouth daily.   Nasacort Allergy 24HR 55 MCG/ACT Aero nasal inhaler Generic drug: triamcinolone Place 2 sprays into the nose daily as needed.   omeprazole 20 MG capsule Commonly known as: PRILOSEC Take 20 mg by mouth daily.   OneTouch Delica Lancets 73X Misc Check blood sugar 3 times daily-DX-E11.9 and Z79.4.   OneTouch Verio w/Device Kit 1 kit by Does not apply route 3 (three) times daily.   pregabalin 75 MG capsule Commonly known as: LYRICA Take 1 capsule (75 mg total) by mouth at bedtime.   Systane Complete 0.6 % Soln Generic drug: Propylene Glycol Place 1 drop into both eyes 2 (two) times daily as needed (for dry  eyes).   Vitamin D-3 125 MCG (5000 UT) Tabs Take 5,000 tablets by mouth every evening.      Allergies  Allergen Reactions  . Gabapentin Other (See Comments)    Caused upset stomach  . Penicillins Hives and Other (See Comments)    Has patient had a PCN reaction causing immediate rash, facial/tongue/throat swelling, SOB or lightheadedness with hypotension: Unknown Has patient had a PCN reaction causing severe rash involving mucus membranes or skin necrosis: Unknown Has patient had a PCN reaction that required hospitalization: Unknown Has patient had a PCN reaction occurring within the last 10 years: No If all of the above answers are "NO", then may proceed with Cephalosporin use.   Marland Kitchen Pepcid [Famotidine] Other (See Comments)    HEADACHE   Follow-up Information    Caitlyn Cha, MD. Schedule an appointment as soon as possible for a visit in 2 week(s).   Specialty: Internal Medicine Contact information: [address] [place] [address] [phone]        Delrae Rend, MD. Schedule an appointment as soon as possible for a visit.   Specialty: Endocrinology Why: f/u as scheduled Contact information: [address]  [place] [address] [phone]            The results of significant diagnostics from this hospitalization (including imaging, microbiology, ancillary and laboratory) are listed below for reference.    Significant Diagnostic Studies: DG Chest 2 View  Result Date: 08/12/2019 CLINICAL DATA:  Recent pleural effusion EXAM: CHEST - 2 VIEW COMPARISON:  May 29, 2019 FINDINGS: Lungs are clear. No pleural effusions evident. Heart size and pulmonary vascularity are normal. No adenopathy. No pneumothorax. No bone lesions. IMPRESSION: Lungs clear. No pleural effusions. Cardiac silhouette normal. No adenopathy. Electronically Signed   By: Lowella Grip III M.D.   On: 08/12/2019 14:34    Microbiology: Recent Results  (from the past 240 hour(s))  SARS CORONAVIRUS 2 (TAT 6-24 HRS) Nasopharyngeal Nasopharyngeal Swab     Status: None   Collection Time: 08/31/19 12:35 PM   Specimen: Nasopharyngeal Swab  Result Value Ref Range Status   SARS Coronavirus 2 NEGATIVE NEGATIVE Final    Comment: (NOTE) SARS-CoV-2 target nucleic acids are NOT DETECTED. The SARS-CoV-2 RNA is generally detectable in upper and lower respiratory specimens during the acute phase of infection. Negative results do not preclude SARS-CoV-2 infection, do not rule out co-infections with other pathogens, and should not be used as the sole basis for treatment or other patient management decisions. Negative results must be combined with clinical observations, patient history, and epidemiological information. The expected result is Negative. Fact Sheet for Patients: SugarRoll.be Fact Sheet  for Healthcare Providers: https://www.woods-mathews.com/ This test is not yet approved or cleared by the [place] and  has been authorized for detection and/or diagnosis of SARS-CoV-2 by FDA under an Emergency Use Authorization (EUA). This EUA will remain  in effect (meaning this test can be used) for the duration of the COVID-19 declaration under Section 56 4(b)(1) of the Act, 21 U.S.C. section 360bbb-3(b)(1), unless the authorization is terminated or revoked sooner. Performed at [facility], [place] [address], [place], [place] [address]   MRSA PCR Screening     Status: None   Collection Time: 09/01/19  1:36 AM   Specimen: Nasopharyngeal  Result Value Ref Range Status   MRSA by PCR NEGATIVE NEGATIVE Final    Comment:        The GeneXpert MRSA Assay (FDA approved for NASAL specimens only), is one component of a comprehensive MRSA colonization surveillance program. It is not intended to diagnose MRSA infection nor to guide or monitor treatment for MRSA infections. Performed at [facility], [place] [address], [place], [place] [address]      Labs: Basic Metabolic Panel: Recent Labs  Lab 08/31/19 2200 09/01/19 0226 09/01/19 0608 09/01/19 0820 09/02/19 0333  NA 138 138 139 138 142  K 4.2 3.9 3.7 3.8 3.8  CL 106 107 106 105 111  CO2 23 22 21* 22 23  GLUCOSE 106* 117* 125* 134* 146*  BUN 20 21 20 20 17   CREATININE 0.95 0.91 0.97 0.93 0.88  CALCIUM 8.7* 8.5* 9.2 9.1 8.7*   Liver Function Tests: Recent Labs  Lab 08/31/19 0949  AST 20  ALT 21  ALKPHOS 103  BILITOT 1.7*  PROT 7.4  ALBUMIN 4.0   Recent Labs  Lab 08/31/19 0949  LIPASE 21   No results for input(s): AMMONIA in the last 168 hours. CBC: Recent Labs  Lab 08/31/19 1132 09/02/19 0333  WBC 11.6* 11.4*  NEUTROABS 8.1* 3.3  HGB 11.7* 10.8*  HCT 37.5 35.3*  MCV 80.5 81.5  PLT 199 182   Cardiac Enzymes: No results for input(s): CKTOTAL, CKMB, CKMBINDEX, TROPONINI in the last 168 hours. BNP: BNP (last 3 results) Recent Labs    05/28/19 1258  BNP 241.6*    ProBNP (last 3 results) No results for input(s): PROBNP in the last 8760 hours.  CBG: Recent Labs  Lab 09/01/19 0747 09/01/19 1123 09/01/19 1621 09/01/19 1935 09/02/19 0747  GLUCAP 121* 226* 249* 187* 157*       Signed:  Irine Seal MD.  [facility] 09/02/2019, 11:06 AM

## 2019-09-09 ENCOUNTER — Telehealth: Payer: Self-pay | Admitting: Podiatry

## 2019-09-09 NOTE — Telephone Encounter (Signed)
Pt wants to know if she can get her lyrica refilled has appt 09/28/2019 w/ Jacqualyn Posey her pharmacy [facility] [address] [phone]

## 2019-09-10 MED ORDER — PREGABALIN 75 MG PO CAPS: 75.0000 mg | ORAL_CAPSULE | Freq: Every day | ORAL | 0 refills | Status: DC

## 2019-09-10 NOTE — Telephone Encounter (Signed)
Orders called to [phone].

## 2019-09-10 NOTE — Addendum Note (Signed)
Addended by: Harriett Sine D on: 09/10/2019 08:17 AM   Modules accepted: Orders

## 2019-09-13 ENCOUNTER — Encounter (INDEPENDENT_AMBULATORY_CARE_PROVIDER_SITE_OTHER): Payer: Medicare Other | Admitting: Ophthalmology

## 2019-09-13 DIAGNOSIS — H43813 Vitreous degeneration, bilateral: Secondary | ICD-10-CM

## 2019-09-13 DIAGNOSIS — E113313 Type 2 diabetes mellitus with moderate nonproliferative diabetic retinopathy with macular edema, bilateral: Secondary | ICD-10-CM | POA: Diagnosis not present

## 2019-09-13 DIAGNOSIS — E11311 Type 2 diabetes mellitus with unspecified diabetic retinopathy with macular edema: Secondary | ICD-10-CM

## 2019-09-28 ENCOUNTER — Other Ambulatory Visit: Payer: Self-pay

## 2019-09-28 ENCOUNTER — Encounter: Payer: Self-pay | Admitting: Podiatry

## 2019-09-28 ENCOUNTER — Ambulatory Visit (INDEPENDENT_AMBULATORY_CARE_PROVIDER_SITE_OTHER): Payer: Medicare Other | Admitting: Podiatry

## 2019-09-28 VITALS — Temp 98.7°F

## 2019-09-28 DIAGNOSIS — E119 Type 2 diabetes mellitus without complications: Secondary | ICD-10-CM | POA: Diagnosis not present

## 2019-09-28 DIAGNOSIS — E1149 Type 2 diabetes mellitus with other diabetic neurological complication: Secondary | ICD-10-CM | POA: Diagnosis not present

## 2019-09-28 MED ORDER — PREGABALIN 150 MG PO CAPS: 150.0000 mg | ORAL_CAPSULE | Freq: Every day | ORAL | 0 refills | Status: DC

## 2019-09-28 NOTE — Patient Instructions (Signed)
Pregabalin capsules What is this medicine? PREGABALIN (pre GAB a lin) is used to treat nerve pain from diabetes, shingles, spinal cord injury, and fibromyalgia. It is also used to control seizures in epilepsy. This medicine may be used for other purposes; ask your health care provider or pharmacist if you have questions. COMMON BRAND NAME(S): Lyrica What should I tell my health care provider before I take this medicine? They need to know if you have any of these conditions:  heart disease  history of drug abuse or alcohol abuse problem  kidney disease  lung or breathing disease  suicidal thoughts, plans, or attempt; a previous suicide attempt by you or a family member  an unusual or allergic reaction to pregabalin, gabapentin, other medicines, foods, dyes, or preservatives  pregnant or trying to get pregnant  breast-feeding How should I use this medicine? Take this medicine by mouth with a glass of water. Follow the directions on the prescription label. You can take it with or without food. If it upsets your stomach, take it with food. Take your medicine at regular intervals. Do not take it more often than directed. Do not stop taking except on your doctor's advice. A special MedGuide will be given to you by the pharmacist with each prescription and refill. Be sure to read this information carefully each time. Talk to your pediatrician regarding the use of this medicine in children. While this drug may be prescribed for children as young as 1 month for selected conditions, precautions do apply. Overdosage: If you think you have taken too much of this medicine contact a poison control center or emergency room at once. NOTE: This medicine is only for you. Do not share this medicine with others. What if I miss a dose? If you miss a dose, take it as soon as you can. If it is almost time for your next dose, take only that dose. Do not take double or extra doses. What may interact with this  medicine? This medicine may interact with the following medications:  alcohol  antihistamines for allergy, cough, and cold  certain medicines for anxiety or sleep  certain medicines for depression like amitriptyline, fluoxetine, sertraline  certain medicines for diabetes  certain medicines for seizures like phenobarbital, primidone  general anesthetics like halothane, isoflurane, methoxyflurane, propofol  local anesthetics like lidocaine, pramoxine, tetracaine  medicines that relax muscles for surgery  narcotic medicines for pain  phenothiazines like chlorpromazine, mesoridazine, prochlorperazine, thioridazine This list may not describe all possible interactions. Give your health care provider a list of all the medicines, herbs, non-prescription drugs, or dietary supplements you use. Also tell them if you smoke, drink alcohol, or use illegal drugs. Some items may interact with your medicine. What should I watch for while using this medicine? Tell your doctor or healthcare professional if your symptoms do not start to get better or if they get worse. Visit your doctor or health care professional for regular checks on your progress. Do not stop taking except on your doctor's advice. You may develop a severe reaction. Your doctor will tell you how much medicine to take. Wear a medical identification bracelet or chain if you are taking this medicine for seizures, and carry a card that describes your disease and details of your medicine and dosage times. You may get drowsy or dizzy. Do not drive, use machinery, or do anything that needs mental alertness until you know how this medicine affects you. Do not stand or sit up quickly, especially if   you are an older patient. This reduces the risk of dizzy or fainting spells. Alcohol may interfere with the effect of this medicine. Avoid alcoholic drinks. If you have a heart condition, like congestive heart failure, and notice that you are retaining  water and have swelling in your hands or feet, contact your health care provider immediately. The use of this medicine may increase the chance of suicidal thoughts or actions. Pay special attention to how you are responding while on this medicine. Any worsening of mood, or thoughts of suicide or dying should be reported to your health care professional right away. This medicine has caused reduced sperm counts in some men. This may interfere with the ability to father a child. You should talk to your doctor or health care professional if you are concerned about your fertility. Women who become pregnant while using this medicine for seizures may enroll in the [place] Antiepileptic Drug Pregnancy Registry by calling 1-888-233-2334. This registry collects information about the safety of antiepileptic drug use during pregnancy. What side effects may I notice from receiving this medicine? Side effects that you should report to your doctor or health care professional as soon as possible:  allergic reactions like skin rash, itching or hives, swelling of the face, lips, or tongue  breathing problems  changes in vision  chest pain  confusion  jerking or unusual movements of any part of your body  loss of memory  muscle pain, tenderness, or weakness  suicidal thoughts or other mood changes  swelling of the ankles, feet, hands  unusual bruising or bleeding Side effects that usually do not require medical attention (report to your doctor or health care professional if they continue or are bothersome):  dizziness  drowsiness  dry mouth  headache  nausea  tremors  trouble sleeping  weight gain This list may not describe all possible side effects. Call your doctor for medical advice about side effects. You may report side effects to FDA at 1-800-FDA-1088. Where should I keep my medicine? Keep out of the reach of children. This medicine can be abused. Keep your medicine in a safe  place to protect it from theft. Do not share this medicine with anyone. Selling or giving away this medicine is dangerous and against the law. This medicine may cause accidental overdose and death if it taken by other adults, children, or pets. Mix any unused medicine with a substance like cat litter or coffee grounds. Then throw the medicine away in a sealed container like a sealed bag or a coffee can with a lid. Do not use the medicine after the expiration date. Store at room temperature between 15 and 30 degrees C (59 and 86 degrees F). NOTE: This sheet is a summary. It may not cover all possible information. If you have questions about this medicine, talk to your doctor, pharmacist, or health care provider.  2020 Elsevier/Gold Standard (2018-09-11 13:15:55)  

## 2019-09-30 NOTE — Progress Notes (Signed)
Subjective: 64 year old female presents the office today for follow-up evaluation of neuropathy and requesting a refill of Lyrica.  She states that she was taking Lyrica 75 mg twice a day but not able to tolerate it during the day.  She takes just 1 pill at nighttime and she does not wake up groggy or sleepy.  Most of her symptoms are at nighttime.  She states that when she was in the hospital I did recommend increasing the dose to 150 mg at nighttime but she has not yet done this. Denies any systemic complaints such as fevers, chills, nausea, vomiting. No acute changes since last appointment, and no other complaints at this time.   Objective: AAO x3, NAD DP/PT pulses palpable bilaterally, CRT less than 3 seconds Sensation decreased with Semmes Weinstein monofilament.  No area of tenderness identified bilaterally no edema, erythema.  No open lesions.  Mild dry skin present plantar aspect of the feet. No open lesions or pre-ulcerative lesions.  No pain with calf compression, swelling, warmth, erythema  Assessment: Neuropathy  Plan: -All treatment options discussed with the patient including all alternatives, risks, complications.  -Discussed increasing Lyrica to 150 mg at nighttime.  This was sent to the pharmacy.  She has any side effects at this time on this now we will decrease back down to 75 mg. -Discussed the importance of daily foot inspection -Moisturizer daily. -Patient encouraged to call the office with any questions, concerns, change in symptoms.   Return in about 6 months (around 03/27/2020).  Trula Slade DPM

## 2019-10-11 ENCOUNTER — Other Ambulatory Visit: Payer: Self-pay

## 2019-10-11 ENCOUNTER — Encounter: Payer: Medicare Other | Attending: Internal Medicine | Admitting: Dietician

## 2019-10-11 ENCOUNTER — Encounter: Payer: Self-pay | Admitting: Dietician

## 2019-10-11 DIAGNOSIS — E1169 Type 2 diabetes mellitus with other specified complication: Secondary | ICD-10-CM | POA: Insufficient documentation

## 2019-10-11 DIAGNOSIS — Z794 Long term (current) use of insulin: Secondary | ICD-10-CM | POA: Insufficient documentation

## 2019-10-11 NOTE — Progress Notes (Signed)
Diabetes Self-Management Education  Visit Type: First/Initial  Appt. Start Time: 1530 Appt. End Time: [id number]  10/18/2019  [name], identified by name and date of birth, is a 64 y.o. female with a diagnosis of Diabetes: Type 2.   ASSESSMENT Patient is here today alone. She states that she is on a Low Sodium diet, plans to give up  red meat but does not know what to eat.   Dr. Buddy Duty - endocrinologist  History includes:  Type 2 diabetes since 2000.  She states that she had food poisoning.  Her gallbladder and the tail of the pancrease were removed. She has had multiple hospitalizations this year. She stated that in August 2020 she wanted to get dentures and had 3 teeth removed.  Her BG then dropped to 19.   She states that she fell the day after and states that she had a fib. In September she used a [employer] but got cellulitus and 2 blood clots. In December she got food poisoning and UTI and went into DKA. Other history includes macular degeneration.  A1C 12.8% 08/31/2019 increased from 9% 05/28/2019. Medication includes NPH insulin 7 units q am and 5 units q pm if BG <200 and 7 units q pm if BG >200. She states that she was given a medication schedule at the hospital which has helped. She does not have a glucagon product and discussed Baqsimi and how this works.  Weight 133 lbs 10/11/19 and increased due to fluid retention per patient.  She was up to 150 lbs due to increased fluid retention. UBW 125 lbs.    She lives with her husband.  He does not know much about diabetes per patient although is trying to learn.  They have been together 31 years. Her parents were her support with diabetes and they have passed away. She follows a low sodium diet and has been learning to cook with herbs and spices. She states that she is afraid to eat now due to all that has happened to her this year. Walks daily and states that she sleeps better when she walks.  She was to have an  appointment today 10/18/2019 but is not feeling well and cancelled.  Called patient who states that her BG was 104 this am.  She is eating a lot of salads and adds lean meat as desired.  She states that this has kept her full.  She reports no questions today and will make an appointment when she is feeling better. She states that the weather has caused increased shoulder pain and she is unable to turn her neck.  States that she usually gets a steroid injection to resolve this problem.  Height 5' (1.524 m), weight 133 lb (60.3 kg). Body mass index is 25.97 kg/m.  Diabetes Self-Management Education - 10/11/19 1622      Visit Information   Visit Type  First/Initial      Initial Visit   Diabetes Type  Type 2    Are you currently following a meal plan?  No    Are you taking your medications as prescribed?  Yes    Date Diagnosed  2000      Health Coping   How would you rate your overall health?  Fair      Psychosocial Assessment   Patient Belief/Attitude about Diabetes  Motivated to manage diabetes    Self-care barriers  None    Self-management support  Doctor's office    Other persons present  Patient    Patient Concerns  Nutrition/Meal planning;Glycemic Control    Special Needs  None    Preferred Learning Style  No preference indicated    Learning Readiness  Ready    How often do you need to have someone help you when you read instructions, pamphlets, or other written materials from your doctor or pharmacy?  1 - Never    What is the last grade level you completed in school?  12th grade      Pre-Education Assessment   Patient understands the diabetes disease and treatment process.  Needs Instruction    Patient understands incorporating nutritional management into lifestyle.  Needs Instruction    Patient undertands incorporating physical activity into lifestyle.  Needs Instruction    Patient understands using medications safely.  Needs Instruction    Patient understands monitoring  blood glucose, interpreting and using results  Needs Instruction    Patient understands prevention, detection, and treatment of acute complications.  Needs Instruction    Patient understands prevention, detection, and treatment of chronic complications.  Needs Instruction    Patient understands how to develop strategies to address psychosocial issues.  Needs Instruction    Patient understands how to develop strategies to promote health/change behavior.  Needs Instruction      Complications   Last HgB A1C per patient/outside source  12.8 %   08/31/2019 increased from 9% 05/28/2019   How often do you check your blood sugar?  3-4 times/day    Fasting Blood glucose range (mg/dL)  >200    Postprandial Blood glucose range (mg/dL)  >200    Number of hypoglycemic episodes per month  0    Number of hyperglycemic episodes per week  9    Can you tell when your blood sugar is high?  No    Have you had a dilated eye exam in the past 12 months?  Yes    Have you had a dental exam in the past 12 months?  Yes    Are you checking your feet?  Yes    How many days per week are you checking your feet?  3      Dietary Intake   Breakfast  plain instant oatmeal with 1 Equal, coffee OR 1/2 bagel with cream cheese or 1/2 English muffin with peanut butter    Snack (morning)  none    Lunch  1/2 thin buns with cheese, [place] or chicken, mayo, 4-5 potato chips    Snack (afternoon)  none    Architectural technologist, [employer] crackers or cornbread OR Frozen stirfry with noodles    Snack (evening)  occasional grapes or apple    Beverage(s)  water, unsweetened tea, coffee with 1 equal and 2% milk      Exercise   Exercise Type  Light (walking / raking leaves)    How many days per week to you exercise?  7    How many minutes per day do you exercise?  30    Total minutes per week of exercise  210      Patient Education   Previous Diabetes Education  No    Disease state   Definition of diabetes, type 1 and 2, and the diagnosis of  diabetes    Nutrition management   Role of diet in the treatment of diabetes and the relationship between the three main macronutrients and blood glucose level;Food label reading, portion sizes and measuring food.;Reviewed blood glucose goals for pre and post meals and  how to evaluate the patients' food intake on their blood glucose level.;Meal options for control of blood glucose level and chronic complications.       Individualized Plan for Diabetes Self-Management Training:   Learning Objective:  Patient will have a greater understanding of diabetes self-management. Patient education plan is to attend individual and/or group sessions per assessed needs and concerns.   Plan:   Patient Instructions  Consider an emergency glucagon product.  Does your insurance cover Baqsimi?  Consider discussing with Dr. Buddy Duty  Consider the plate method for planning meals. Continue a low sodium diet. Consider using the regular oats rather than instant.  You can add some berries and walnuts or almonds if you wish.  Beans are a great protein source and have very good fiber. 1/2 of your plate should be non starchy vegetables.   Expected Outcomes:     Education material provided: ADA - How to Thrive: A Guide for Your Journey with Diabetes, Food label handouts, Meal plan card and Snack sheet  If problems or questions, patient to contact team via:  Phone and Email  Future DSME appointment:

## 2019-10-11 NOTE — Patient Instructions (Addendum)
Consider an emergency glucagon product.  Does your insurance cover Baqsimi?  Consider discussing with Dr. Buddy Duty  Consider the plate method for planning meals. Continue a low sodium diet. Consider using the regular oats rather than instant.  You can add some berries and walnuts or almonds if you wish.  Beans are a great protein source and have very good fiber. 1/2 of your plate should be non starchy vegetables.

## 2019-10-18 ENCOUNTER — Ambulatory Visit: Payer: Medicare Other | Admitting: Dietician

## 2019-11-10 ENCOUNTER — Other Ambulatory Visit: Payer: Self-pay | Admitting: Podiatry

## 2019-11-10 ENCOUNTER — Ambulatory Visit (INDEPENDENT_AMBULATORY_CARE_PROVIDER_SITE_OTHER): Payer: Medicare Other | Admitting: Podiatry

## 2019-11-10 ENCOUNTER — Other Ambulatory Visit: Payer: Self-pay

## 2019-11-10 ENCOUNTER — Ambulatory Visit (INDEPENDENT_AMBULATORY_CARE_PROVIDER_SITE_OTHER): Payer: Medicare Other

## 2019-11-10 VITALS — Temp 97.7°F

## 2019-11-10 DIAGNOSIS — L97512 Non-pressure chronic ulcer of other part of right foot with fat layer exposed: Secondary | ICD-10-CM

## 2019-11-10 DIAGNOSIS — E0843 Diabetes mellitus due to underlying condition with diabetic autonomic (poly)neuropathy: Secondary | ICD-10-CM

## 2019-11-10 DIAGNOSIS — M79671 Pain in right foot: Secondary | ICD-10-CM

## 2019-11-10 MED ORDER — DOXYCYCLINE HYCLATE 100 MG PO TABS: 100.0000 mg | ORAL_TABLET | Freq: Two times a day (BID) | ORAL | 0 refills | Status: DC
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
## 2019-11-15 ENCOUNTER — Encounter (INDEPENDENT_AMBULATORY_CARE_PROVIDER_SITE_OTHER): Payer: Medicare Other | Admitting: Ophthalmology

## 2019-11-18 ENCOUNTER — Ambulatory Visit (INDEPENDENT_AMBULATORY_CARE_PROVIDER_SITE_OTHER): Payer: Medicare Other | Admitting: Podiatry

## 2019-11-18 ENCOUNTER — Other Ambulatory Visit: Payer: Self-pay

## 2019-11-18 ENCOUNTER — Emergency Department (HOSPITAL_COMMUNITY): Payer: Medicare Other

## 2019-11-18 ENCOUNTER — Inpatient Hospital Stay (HOSPITAL_COMMUNITY)
Admission: EM | Admit: 2019-11-18 | Discharge: 2019-11-18 | DRG: 935 | Disposition: A | Discharge status: Other Acute Inpatient Hospital | Payer: Medicare Other | Attending: Emergency Medicine | Admitting: Emergency Medicine

## 2019-11-18 ENCOUNTER — Telehealth: Payer: Self-pay | Admitting: *Deleted

## 2019-11-18 VITALS — BP 151/75 | HR 77 | Temp 97.0°F | Resp 14

## 2019-11-18 DIAGNOSIS — T310 Burns involving less than 10% of body surface: Secondary | ICD-10-CM | POA: Insufficient documentation

## 2019-11-18 DIAGNOSIS — I480 Paroxysmal atrial fibrillation: Secondary | ICD-10-CM | POA: Diagnosis present

## 2019-11-18 DIAGNOSIS — E1165 Type 2 diabetes mellitus with hyperglycemia: Secondary | ICD-10-CM | POA: Diagnosis present

## 2019-11-18 DIAGNOSIS — Z90710 Acquired absence of both cervix and uterus: Secondary | ICD-10-CM

## 2019-11-18 DIAGNOSIS — Z833 Family history of diabetes mellitus: Secondary | ICD-10-CM | POA: Diagnosis not present

## 2019-11-18 DIAGNOSIS — E782 Mixed hyperlipidemia: Secondary | ICD-10-CM | POA: Diagnosis present

## 2019-11-18 DIAGNOSIS — Y929 Unspecified place or not applicable: Secondary | ICD-10-CM

## 2019-11-18 DIAGNOSIS — R739 Hyperglycemia, unspecified: Secondary | ICD-10-CM

## 2019-11-18 DIAGNOSIS — E1142 Type 2 diabetes mellitus with diabetic polyneuropathy: Secondary | ICD-10-CM | POA: Diagnosis present

## 2019-11-18 DIAGNOSIS — Z86718 Personal history of other venous thrombosis and embolism: Secondary | ICD-10-CM

## 2019-11-18 DIAGNOSIS — X16XXXA Contact with hot heating appliances, radiators and pipes, initial encounter: Secondary | ICD-10-CM | POA: Diagnosis present

## 2019-11-18 DIAGNOSIS — Z803 Family history of malignant neoplasm of breast: Secondary | ICD-10-CM | POA: Diagnosis not present

## 2019-11-18 DIAGNOSIS — Z8541 Personal history of malignant neoplasm of cervix uteri: Secondary | ICD-10-CM

## 2019-11-18 DIAGNOSIS — K219 Gastro-esophageal reflux disease without esophagitis: Secondary | ICD-10-CM | POA: Diagnosis present

## 2019-11-18 DIAGNOSIS — L03115 Cellulitis of right lower limb: Secondary | ICD-10-CM | POA: Diagnosis present

## 2019-11-18 DIAGNOSIS — Z83511 Family history of glaucoma: Secondary | ICD-10-CM | POA: Diagnosis not present

## 2019-11-18 DIAGNOSIS — I10 Essential (primary) hypertension: Secondary | ICD-10-CM | POA: Diagnosis present

## 2019-11-18 DIAGNOSIS — Z87891 Personal history of nicotine dependence: Secondary | ICD-10-CM

## 2019-11-18 DIAGNOSIS — Z794 Long term (current) use of insulin: Secondary | ICD-10-CM

## 2019-11-18 DIAGNOSIS — Z880 Allergy status to penicillin: Secondary | ICD-10-CM

## 2019-11-18 DIAGNOSIS — M109 Gout, unspecified: Secondary | ICD-10-CM | POA: Diagnosis present

## 2019-11-18 DIAGNOSIS — T300 Burn of unspecified body region, unspecified degree: Secondary | ICD-10-CM

## 2019-11-18 DIAGNOSIS — Z818 Family history of other mental and behavioral disorders: Secondary | ICD-10-CM | POA: Diagnosis not present

## 2019-11-18 DIAGNOSIS — Z8249 Family history of ischemic heart disease and other diseases of the circulatory system: Secondary | ICD-10-CM | POA: Diagnosis not present

## 2019-11-18 DIAGNOSIS — L97512 Non-pressure chronic ulcer of other part of right foot with fat layer exposed: Secondary | ICD-10-CM

## 2019-11-18 DIAGNOSIS — E1149 Type 2 diabetes mellitus with other diabetic neurological complication: Secondary | ICD-10-CM

## 2019-11-18 DIAGNOSIS — Z7901 Long term (current) use of anticoagulants: Secondary | ICD-10-CM

## 2019-11-18 DIAGNOSIS — T25021A Burn of unspecified degree of right foot, initial encounter: Secondary | ICD-10-CM | POA: Diagnosis present

## 2019-11-18 DIAGNOSIS — Z888 Allergy status to other drugs, medicaments and biological substances status: Secondary | ICD-10-CM

## 2019-11-18 DIAGNOSIS — Z20822 Contact with and (suspected) exposure to covid-19: Secondary | ICD-10-CM | POA: Diagnosis present

## 2019-11-18 DIAGNOSIS — Z9049 Acquired absence of other specified parts of digestive tract: Secondary | ICD-10-CM

## 2019-11-18 DIAGNOSIS — L03119 Cellulitis of unspecified part of limb: Secondary | ICD-10-CM | POA: Diagnosis present

## 2019-11-18 DIAGNOSIS — Z825 Family history of asthma and other chronic lower respiratory diseases: Secondary | ICD-10-CM | POA: Diagnosis not present

## 2019-11-18 DIAGNOSIS — T25221A Burn of second degree of right foot, initial encounter: Secondary | ICD-10-CM

## 2019-11-18 DIAGNOSIS — Z79899 Other long term (current) drug therapy: Secondary | ICD-10-CM

## 2019-11-18 LAB — URINALYSIS, ROUTINE W REFLEX MICROSCOPIC
Bacteria, UA: NONE SEEN
Bilirubin Urine: NEGATIVE
Glucose, UA: >=500 mg/dL — AB
Ketones, ur: NEGATIVE mg/dL
Leukocytes,Ua: NEGATIVE
Nitrite: NEGATIVE
Protein, ur: 100 mg/dL — AB
Specific Gravity, Urine: 1.016 (ref 1.005–1.030)
pH: 6 (ref 5.0–8.0)

## 2019-11-18 LAB — SARS CORONAVIRUS 2 (TAT 6-24 HRS): SARS Coronavirus 2: NEGATIVE

## 2019-11-18 LAB — POCT I-STAT EG7
Acid-Base Excess: 6 mmol/L — ABNORMAL HIGH (ref 0.0–2.0)
Bicarbonate: 33.3 mmol/L — ABNORMAL HIGH (ref 20.0–28.0)
Calcium, Ion: 1.2 mmol/L (ref 1.15–1.40)
HCT: 37 % (ref 36.0–46.0)
Hemoglobin: 12.6 g/dL (ref 12.0–15.0)
O2 Saturation: 87 %
Potassium: 4.6 mmol/L (ref 3.5–5.1)
Sodium: 134 mmol/L — ABNORMAL LOW (ref 135–145)
TCO2: 35 mmol/L — ABNORMAL HIGH (ref 22–32)
pCO2, Ven: 58.3 mmHg (ref 44.0–60.0)
pH, Ven: 7.365 (ref 7.250–7.430)
pO2, Ven: 56 mmHg — ABNORMAL HIGH (ref 32.0–45.0)

## 2019-11-18 LAB — BASIC METABOLIC PANEL
Anion gap: 13 (ref 5–15)
BUN: 18 mg/dL (ref 8–23)
CO2: 25 mmol/L (ref 22–32)
Calcium: 9.4 mg/dL (ref 8.9–10.3)
Chloride: 95 mmol/L — ABNORMAL LOW (ref 98–111)
Creatinine, Ser: 1.04 mg/dL — ABNORMAL HIGH (ref 0.44–1.00)
GFR calc Af Amer: >60 mL/min (ref >60)
GFR calc non Af Amer: 56 mL/min — ABNORMAL LOW (ref >60)
Glucose, Bld: 513 mg/dL (ref 70–99)
Potassium: 5.4 mmol/L — ABNORMAL HIGH (ref 3.5–5.1)
Sodium: 133 mmol/L — ABNORMAL LOW (ref 135–145)

## 2019-11-18 LAB — CBC WITH DIFFERENTIAL/PLATELET
Abs Immature Granulocytes: 0.07 10*3/uL (ref 0.00–0.07)
Basophils Absolute: 0.2 10*3/uL — ABNORMAL HIGH (ref 0.0–0.1)
Basophils Relative: 1 %
Eosinophils Absolute: 0.1 10*3/uL (ref 0.0–0.5)
Eosinophils Relative: 1 %
HCT: 36.2 % (ref 36.0–46.0)
Hemoglobin: 11.2 g/dL — ABNORMAL LOW (ref 12.0–15.0)
Immature Granulocytes: 1 %
Lymphocytes Relative: 26 %
Lymphs Abs: 3.3 10*3/uL (ref 0.7–4.0)
MCH: 24.9 pg — ABNORMAL LOW (ref 26.0–34.0)
MCHC: 30.9 g/dL (ref 30.0–36.0)
MCV: 80.4 fL (ref 80.0–100.0)
Monocytes Absolute: 1 10*3/uL (ref 0.1–1.0)
Monocytes Relative: 8 %
Neutro Abs: 7.9 10*3/uL — ABNORMAL HIGH (ref 1.7–7.7)
Neutrophils Relative %: 63 %
Platelets: 502 10*3/uL — ABNORMAL HIGH (ref 150–400)
RBC: 4.5 MIL/uL (ref 3.87–5.11)
RDW: 15.7 % — ABNORMAL HIGH (ref 11.5–15.5)
WBC: 12.4 10*3/uL — ABNORMAL HIGH (ref 4.0–10.5)
nRBC: 0 % (ref 0.0–0.2)

## 2019-11-18 LAB — CBG MONITORING, ED
Glucose-Capillary: 270 mg/dL — ABNORMAL HIGH (ref 70–99)
Glucose-Capillary: 462 mg/dL — ABNORMAL HIGH (ref 70–99)

## 2019-11-18 LAB — LACTIC ACID, PLASMA: Lactic Acid, Venous: 1 mmol/L (ref 0.5–1.9)

## 2019-11-18 LAB — BETA-HYDROXYBUTYRIC ACID: Beta-Hydroxybutyric Acid: 0.11 mmol/L (ref 0.05–0.27)

## 2019-11-18 MED ORDER — ATORVASTATIN CALCIUM 10 MG PO TABS: 10.0000 mg | ORAL_TABLET | Freq: Every day | ORAL | Status: DC

## 2019-11-18 MED ORDER — HYDROCODONE-ACETAMINOPHEN 5-325 MG PO TABS: 1.0000 | ORAL_TABLET | Freq: Four times a day (QID) | ORAL | Status: DC | PRN

## 2019-11-18 MED ORDER — APIXABAN 5 MG PO TABS
5.0000 mg | ORAL_TABLET | Freq: Two times a day (BID) | ORAL | Status: DC
  Filled 2019-11-18: qty 1

## 2019-11-18 MED ORDER — ALBUTEROL SULFATE (2.5 MG/3ML) 0.083% IN NEBU: 2.5000 mg | INHALATION_SOLUTION | Freq: Four times a day (QID) | RESPIRATORY_TRACT | Status: DC | PRN

## 2019-11-18 MED ORDER — AMLODIPINE BESYLATE 5 MG PO TABS: 10.0000 mg | ORAL_TABLET | Freq: Every day | ORAL | Status: DC

## 2019-11-18 MED ORDER — ACETAMINOPHEN 325 MG PO TABS: 650.0000 mg | ORAL_TABLET | Freq: Four times a day (QID) | ORAL | Status: DC | PRN

## 2019-11-18 MED ORDER — INSULIN ASPART 100 UNIT/ML ~~LOC~~ SOLN
10.0000 [IU] | Freq: Once | SUBCUTANEOUS | Status: AC
  Administered 2019-11-18: 10 [IU] via INTRAVENOUS

## 2019-11-18 MED ORDER — SODIUM CHLORIDE 0.9 % IV SOLN: Freq: Once | INTRAVENOUS | Status: AC

## 2019-11-18 MED ORDER — VITAMIN D 25 MCG (1000 UNIT) PO TABS: 5000.0000 [IU] | ORAL_TABLET | Freq: Every evening | ORAL | Status: DC

## 2019-11-18 MED ORDER — LOSARTAN POTASSIUM 50 MG PO TABS: 50.0000 mg | ORAL_TABLET | Freq: Every day | ORAL | Status: DC

## 2019-11-18 MED ORDER — ONDANSETRON HCL 4 MG/2ML IJ SOLN: 4.0000 mg | Freq: Four times a day (QID) | INTRAMUSCULAR | Status: DC | PRN

## 2019-11-18 MED ORDER — METOPROLOL SUCCINATE ER 25 MG PO TB24: 25.0000 mg | ORAL_TABLET | Freq: Every day | ORAL | Status: DC

## 2019-11-18 MED ORDER — VANCOMYCIN HCL 750 MG/150ML IV SOLN: 750.0000 mg | INTRAVENOUS | Status: DC

## 2019-11-18 MED ORDER — HYDROCODONE-ACETAMINOPHEN 5-325 MG PO TABS
1.0000 | ORAL_TABLET | Freq: Once | ORAL | Status: AC
  Administered 2019-11-18: 1 via ORAL
  Filled 2019-11-18: qty 1

## 2019-11-18 MED ORDER — SILVER SULFADIAZINE 1 % EX CREA
TOPICAL_CREAM | Freq: Once | CUTANEOUS | Status: AC
  Filled 2019-11-18: qty 85

## 2019-11-18 MED ORDER — ACETAMINOPHEN 650 MG RE SUPP: 650.0000 mg | Freq: Four times a day (QID) | RECTAL | Status: DC | PRN

## 2019-11-18 MED ORDER — PANTOPRAZOLE SODIUM 40 MG PO TBEC: 40.0000 mg | DELAYED_RELEASE_TABLET | Freq: Every day | ORAL | Status: DC

## 2019-11-18 MED ORDER — INSULIN ASPART PROT & ASPART (70-30 MIX) 100 UNIT/ML ~~LOC~~ SUSP: 3.0000 [IU] | SUBCUTANEOUS | Status: DC

## 2019-11-18 MED ORDER — INSULIN ASPART 100 UNIT/ML ~~LOC~~ SOLN: 0.0000 [IU] | Freq: Three times a day (TID) | SUBCUTANEOUS | Status: DC

## 2019-11-18 MED ORDER — VANCOMYCIN HCL IN DEXTROSE 1-5 GM/200ML-% IV SOLN
1000.0000 mg | Freq: Once | INTRAVENOUS | Status: AC
  Administered 2019-11-18: 1000 mg via INTRAVENOUS
  Filled 2019-11-18: qty 200

## 2019-11-18 MED ORDER — SODIUM CHLORIDE 0.9 % IV BOLUS
1000.0000 mL | Freq: Once | INTRAVENOUS | Status: AC
  Administered 2019-11-18: 1000 mL via INTRAVENOUS

## 2019-11-18 MED ORDER — ONDANSETRON HCL 4 MG PO TABS: 4.0000 mg | ORAL_TABLET | Freq: Four times a day (QID) | ORAL | Status: DC | PRN
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
## 2019-11-18 NOTE — ED Notes (Signed)
Pt transported to xray 
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
## 2019-11-18 NOTE — ED Notes (Signed)
[facility] on unit to transfer pt to [facility] per MD order. Personal property accompanies pt,
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
## 2019-11-18 NOTE — ED Notes (Signed)
Attempted to swab pt wound. Wound dry, unable to collect sample. ED provider made aware. No new orders given

## 2019-11-18 NOTE — ED Notes (Signed)
hospitalist at bedside
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
## 2019-11-18 NOTE — ED Notes (Signed)
Called [facility] for pt transport  

## 2019-11-18 NOTE — ED Notes (Signed)
[facility] PAL line(Transfer to [facility]) called @ 1532-per Alroy Bailiff, [place] called by Levada Dy

## 2019-11-18 NOTE — ED Notes (Signed)
Date and time results received: 11/18/19  (use smartphrase ".now" to insert current time)  Test: GLUCOSE Critical Value: 513 mg  Name of Provider Notified: Tegeler MD
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
## 2019-11-19 LAB — HEMOGLOBIN A1C
Hgb A1c MFr Bld: 14.4 % — ABNORMAL HIGH (ref 4.8–5.6)
Mean Plasma Glucose: 367 mg/dL

## 2019-11-22 ENCOUNTER — Ambulatory Visit: Payer: Medicare Other | Admitting: Podiatry

## 2019-11-23 LAB — CULTURE, BLOOD (ROUTINE X 2)
Culture: NO GROWTH
Culture: NO GROWTH

## 2019-12-06 DIAGNOSIS — L309 Dermatitis, unspecified: Secondary | ICD-10-CM | POA: Insufficient documentation

## 2019-12-06 DIAGNOSIS — G8911 Acute pain due to trauma: Secondary | ICD-10-CM | POA: Insufficient documentation

## 2019-12-07 ENCOUNTER — Other Ambulatory Visit: Payer: Self-pay | Admitting: Internal Medicine

## 2019-12-07 DIAGNOSIS — E041 Nontoxic single thyroid nodule: Secondary | ICD-10-CM

## 2019-12-10 ENCOUNTER — Ambulatory Visit (INDEPENDENT_AMBULATORY_CARE_PROVIDER_SITE_OTHER): Payer: Medicare Other | Admitting: Adult Health

## 2019-12-10 ENCOUNTER — Other Ambulatory Visit: Payer: Self-pay

## 2019-12-10 ENCOUNTER — Encounter: Payer: Self-pay | Admitting: Adult Health

## 2019-12-10 VITALS — BP 143/63 | HR 73 | Temp 97.3°F | Ht 60.0 in | Wt 147.0 lb

## 2019-12-10 DIAGNOSIS — R600 Localized edema: Secondary | ICD-10-CM

## 2019-12-10 DIAGNOSIS — J90 Pleural effusion, not elsewhere classified: Secondary | ICD-10-CM

## 2019-12-10 DIAGNOSIS — J9859 Other diseases of mediastinum, not elsewhere classified: Secondary | ICD-10-CM

## 2019-12-10 DIAGNOSIS — R0602 Shortness of breath: Secondary | ICD-10-CM

## 2019-12-10 DIAGNOSIS — I480 Paroxysmal atrial fibrillation: Secondary | ICD-10-CM

## 2019-12-10 MED ORDER — FUROSEMIDE 20 MG PO TABS: 20.0000 mg | ORAL_TABLET | Freq: Every day | ORAL | 3 refills | Status: DC | PRN

## 2019-12-10 NOTE — Assessment & Plan Note (Signed)
Resolved on follow-up chest x-ray 

## 2019-12-10 NOTE — Patient Instructions (Addendum)
Restart Lasix 20mg  2 tabs daily for 2 days then 1 tab daily .  Labs today .  Venous dopplers.  Legs elevated when sitting .  Follow up with Cardiology and [facility] regarding your Eliquis .  Follow up with Primary MD regarding colonoscopy .  Follow up in 2 weeks and As needed   Please contact office for sooner follow up if symptoms do not improve or worsen or seek emergency care
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
## 2019-12-10 NOTE — Addendum Note (Signed)
Addended by: Suzzanne Cloud E on: 12/10/2019 03:26 PM   Modules accepted: Orders

## 2019-12-10 NOTE — Assessment & Plan Note (Addendum)
Mediastinal mass with pleural effusions.  Resolved with antibiotic therapy.  Cytology and pleural fluid cultures were negative.  Follow-up PET scan showed no mediastinal mass or hypermetabolic activity in the chest. Follow-up chest x-ray showed clearance with no abnormalities or effusions. No further scans indicated at this time  Previous PET scan did show a slight hypermetabolic activity along the anterior colon.  She says her colonoscopy is up-to-date.  Have advised her to discuss this with her primary care provider and determine if  any further testing is indicated

## 2019-12-10 NOTE — Assessment & Plan Note (Signed)
Patient's Eliquis is currently on hold.  Of asked her to follow-up with cardiology as discussed.
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
## 2019-12-11 LAB — BASIC METABOLIC PANEL
BUN/Creatinine Ratio: 30 (calc) — ABNORMAL HIGH (ref 6–22)
BUN: 32 mg/dL — ABNORMAL HIGH (ref 7–25)
CO2: 30 mmol/L (ref 20–32)
Calcium: 9.5 mg/dL (ref 8.6–10.4)
Chloride: 102 mmol/L (ref 98–110)
Creat: 1.07 mg/dL — ABNORMAL HIGH (ref 0.50–0.99)
Glucose, Bld: 138 mg/dL — ABNORMAL HIGH (ref 65–99)
Potassium: 4.9 mmol/L (ref 3.5–5.3)
Sodium: 141 mmol/L (ref 135–146)

## 2019-12-11 LAB — BRAIN NATRIURETIC PEPTIDE: Brain Natriuretic Peptide: 140 pg/mL — ABNORMAL HIGH (ref <100)

## 2019-12-14 ENCOUNTER — Other Ambulatory Visit: Payer: Medicare Other

## 2019-12-16 ENCOUNTER — Ambulatory Visit (HOSPITAL_COMMUNITY)
Admission: RE | Admit: 2019-12-16 | Discharge: 2019-12-16 | Disposition: A | Discharge status: Home / Self Care | Payer: Medicare Other | Source: Ambulatory Visit | Attending: Adult Health | Admitting: Adult Health

## 2019-12-16 ENCOUNTER — Other Ambulatory Visit: Payer: Self-pay

## 2019-12-16 DIAGNOSIS — R600 Localized edema: Secondary | ICD-10-CM | POA: Diagnosis not present

## 2019-12-20 ENCOUNTER — Ambulatory Visit: Payer: Medicare Other | Attending: Internal Medicine

## 2019-12-20 DIAGNOSIS — Z23 Encounter for immunization: Secondary | ICD-10-CM

## 2019-12-20 NOTE — Progress Notes (Signed)
   Covid-19 Vaccination Clinic  Name:  [name]    MRN: [id number] DOB: 04-01-1956  12/20/2019  [name] was observed post Covid-19 immunization for 15 minutes without incident. She was provided with Vaccine Information Sheet and instruction to access the V-Safe system.   [name] was instructed to call 911 with any severe reactions post vaccine: Marland Kitchen Difficulty breathing  . Swelling of face and throat  . A fast heartbeat  . A bad rash all over body  . Dizziness and weakness

## 2019-12-20 NOTE — Progress Notes (Signed)
   Covid-19 Vaccination Clinic  Name:  [name]    MRN: [id number] DOB: 1956/08/02  12/20/2019  [name] was observed post Covid-19 immunization for 15 minutes without incident. She was provided with Vaccine Information Sheet and instruction to access the V-Safe system.   [name] was instructed to call 911 with any severe reactions post vaccine: Marland Kitchen Difficulty breathing  . Swelling of face and throat  . A fast heartbeat  . A bad rash all over body  . Dizziness and weakness   Immunizations Administered    Name Date Dose VIS Date Route   Pfizer COVID-19 Vaccine 12/20/2019  8:47 AM 0.3 mL 09/03/2019 Intramuscular   Manufacturer: [place]   Lot: [id number]   [place]: [id number]

## 2019-12-23 ENCOUNTER — Ambulatory Visit: Payer: Medicare Other | Admitting: Adult Health

## 2019-12-24 ENCOUNTER — Other Ambulatory Visit: Payer: Medicare Other

## 2019-12-28 ENCOUNTER — Ambulatory Visit (INDEPENDENT_AMBULATORY_CARE_PROVIDER_SITE_OTHER): Payer: Medicare Other | Admitting: Adult Health

## 2019-12-28 ENCOUNTER — Other Ambulatory Visit: Payer: Self-pay

## 2019-12-28 ENCOUNTER — Encounter: Payer: Self-pay | Admitting: Adult Health

## 2019-12-28 VITALS — BP 120/68 | HR 66 | Temp 98.5°F | Ht 60.0 in | Wt 137.0 lb

## 2019-12-28 DIAGNOSIS — R600 Localized edema: Secondary | ICD-10-CM

## 2019-12-28 DIAGNOSIS — J90 Pleural effusion, not elsewhere classified: Secondary | ICD-10-CM | POA: Diagnosis not present
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
## 2019-12-28 NOTE — Patient Instructions (Addendum)
Follow up with surgeon Dr. Windle Guard regarding enlarged lymph nodes in groin.  Discuss Dr. Doylene Canard that amlodipine may be causing ankle edema.  Continue on Lasix  20mg  daily  Chest xray today .  Low salt diet .  Legs elevated above heart when sitting .  Follow up with Primary MD regarding colonoscopy .  Follow up with Dr. Elsworth Soho  In 4 months and As needed   Please contact office for sooner follow up if symptoms do not improve or worsen or seek emergency care
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
## 2019-12-29 NOTE — Assessment & Plan Note (Signed)
Previously resolved on cxr , will recheck to make sure not returned with LE edema present.   Plan  Patient Instructions  Follow up with surgeon Dr. Windle Guard regarding enlarged lymph nodes in groin.  Discuss Dr. Doylene Canard that amlodipine may be causing ankle edema.  Continue on Lasix  20mg  daily  Chest xray today .  Low salt diet .  Legs elevated above heart when sitting .  Follow up with Primary MD regarding colonoscopy .  Follow up with Dr. Elsworth Soho  In 4 months and As needed   Please contact office for sooner follow up if symptoms do not improve or worsen or seek emergency care

## 2020-01-06 ENCOUNTER — Other Ambulatory Visit: Payer: Self-pay | Admitting: Podiatry

## 2020-01-07 ENCOUNTER — Telehealth: Payer: Self-pay | Admitting: *Deleted

## 2020-01-07 MED ORDER — PREGABALIN 150 MG PO CAPS: 150.0000 mg | ORAL_CAPSULE | Freq: Every day | ORAL | 0 refills | Status: DC

## 2020-01-07 NOTE — Telephone Encounter (Signed)
Left message with Lyrica orders to [id number].

## 2020-01-11 ENCOUNTER — Ambulatory Visit: Payer: Self-pay

## 2020-01-17 ENCOUNTER — Ambulatory Visit: Payer: Medicare Other | Attending: Internal Medicine

## 2020-01-17 DIAGNOSIS — Z23 Encounter for immunization: Secondary | ICD-10-CM

## 2020-01-17 NOTE — Progress Notes (Signed)
   Covid-19 Vaccination Clinic  Name:  [name]    MRN: [id number] DOB: 07/11/1956  01/17/2020  [name] was observed post Covid-19 immunization for 15 minutes without incident. She was provided with Vaccine Information Sheet and instruction to access the V-Safe system.   [name] was instructed to call 911 with any severe reactions post vaccine: Marland Kitchen Difficulty breathing  . Swelling of face and throat  . A fast heartbeat  . A bad rash all over body  . Dizziness and weakness   Immunizations Administered    Name Date Dose VIS Date Route   Pfizer COVID-19 Vaccine 01/17/2020 12:23 PM 0.3 mL 11/17/2018 Intramuscular   Manufacturer: [place]   Lot: [id number]   [place]: [id number]

## 2020-02-28 ENCOUNTER — Other Ambulatory Visit: Payer: Self-pay

## 2020-02-28 ENCOUNTER — Encounter (INDEPENDENT_AMBULATORY_CARE_PROVIDER_SITE_OTHER): Payer: Medicare Other | Admitting: Ophthalmology

## 2020-02-28 DIAGNOSIS — E11311 Type 2 diabetes mellitus with unspecified diabetic retinopathy with macular edema: Secondary | ICD-10-CM

## 2020-02-28 DIAGNOSIS — H43813 Vitreous degeneration, bilateral: Secondary | ICD-10-CM

## 2020-02-28 DIAGNOSIS — E113313 Type 2 diabetes mellitus with moderate nonproliferative diabetic retinopathy with macular edema, bilateral: Secondary | ICD-10-CM

## 2020-03-03 ENCOUNTER — Other Ambulatory Visit: Payer: Self-pay | Admitting: Adult Health

## 2020-03-28 ENCOUNTER — Ambulatory Visit: Payer: Medicare Other | Admitting: Podiatry

## 2020-04-03 ENCOUNTER — Encounter (INDEPENDENT_AMBULATORY_CARE_PROVIDER_SITE_OTHER): Payer: Medicare Other | Admitting: Ophthalmology

## 2020-04-07 ENCOUNTER — Other Ambulatory Visit: Payer: Self-pay | Admitting: Podiatry

## 2020-04-20 ENCOUNTER — Ambulatory Visit (INDEPENDENT_AMBULATORY_CARE_PROVIDER_SITE_OTHER): Payer: Medicare Other | Admitting: Podiatry

## 2020-04-20 ENCOUNTER — Other Ambulatory Visit: Payer: Self-pay

## 2020-04-20 ENCOUNTER — Encounter: Payer: Self-pay | Admitting: Podiatry

## 2020-04-20 DIAGNOSIS — E1149 Type 2 diabetes mellitus with other diabetic neurological complication: Secondary | ICD-10-CM | POA: Diagnosis not present

## 2020-04-20 DIAGNOSIS — L97512 Non-pressure chronic ulcer of other part of right foot with fat layer exposed: Secondary | ICD-10-CM | POA: Diagnosis not present

## 2020-04-20 MED ORDER — PREGABALIN 150 MG PO CAPS: ORAL_CAPSULE | ORAL | 2 refills | Status: DC

## 2020-04-20 NOTE — Patient Instructions (Signed)
Diabetes Mellitus and Foot Care Foot care is an important part of your health, especially when you have diabetes. Diabetes may cause you to have problems because of poor blood flow (circulation) to your feet and legs, which can cause your skin to:  Become thinner and drier.  Break more easily.  Heal more slowly.  Peel and crack. You may also have nerve damage (neuropathy) in your legs and feet, causing decreased feeling in them. This means that you may not notice minor injuries to your feet that could lead to more serious problems. Noticing and addressing any potential problems early is the best way to prevent future foot problems. How to care for your feet Foot hygiene  Wash your feet daily with warm water and mild soap. Do not use hot water. Then, pat your feet and the areas between your toes until they are completely dry. Do not soak your feet as this can dry your skin.  Trim your toenails straight across. Do not dig under them or around the cuticle. File the edges of your nails with an emery board or nail file.  Apply a moisturizing lotion or petroleum jelly to the skin on your feet and to dry, brittle toenails. Use lotion that does not contain alcohol and is unscented. Do not apply lotion between your toes. Shoes and socks  Wear clean socks or stockings every day. Make sure they are not too tight. Do not wear knee-high stockings since they may decrease blood flow to your legs.  Wear shoes that fit properly and have enough cushioning. Always look in your shoes before you put them on to be sure there are no objects inside.  To break in new shoes, wear them for just a few hours a day. This prevents injuries on your feet. Wounds, scrapes, corns, and calluses  Check your feet daily for blisters, cuts, bruises, sores, and redness. If you cannot see the bottom of your feet, use a mirror or ask someone for help.  Do not cut corns or calluses or try to remove them with medicine.  If you  find a minor scrape, cut, or break in the skin on your feet, keep it and the skin around it clean and dry. You may clean these areas with mild soap and water. Do not clean the area with peroxide, alcohol, or iodine.  If you have a wound, scrape, corn, or callus on your foot, look at it several times a day to make sure it is healing and not infected. Check for: ? Redness, swelling, or pain. ? Fluid or blood. ? Warmth. ? Pus or a bad smell. General instructions  Do not cross your legs. This may decrease blood flow to your feet.  Do not use heating pads or hot water bottles on your feet. They may burn your skin. If you have lost feeling in your feet or legs, you may not know this is happening until it is too late.  Protect your feet from hot and cold by wearing shoes, such as at the beach or on hot pavement.  Schedule a complete foot exam at least once a year (annually) or more often if you have foot problems. If you have foot problems, report any cuts, sores, or bruises to your health care provider immediately. Contact a health care provider if:  You have a medical condition that increases your risk of infection and you have any cuts, sores, or bruises on your feet.  You have an injury that is not   healing.  You have redness on your legs or feet.  You feel burning or tingling in your legs or feet.  You have pain or cramps in your legs and feet.  Your legs or feet are numb.  Your feet always feel cold.  You have pain around a toenail. Get help right away if:  You have a wound, scrape, corn, or callus on your foot and: ? You have pain, swelling, or redness that gets worse. ? You have fluid or blood coming from the wound, scrape, corn, or callus. ? Your wound, scrape, corn, or callus feels warm to the touch. ? You have pus or a bad smell coming from the wound, scrape, corn, or callus. ? You have a fever. ? You have a red line going up your leg. Summary  Check your feet every day  for cuts, sores, red spots, swelling, and blisters.  Moisturize feet and legs daily.  Wear shoes that fit properly and have enough cushioning.  If you have foot problems, report any cuts, sores, or bruises to your health care provider immediately.  Schedule a complete foot exam at least once a year (annually) or more often if you have foot problems. This information is not intended to replace advice given to you by your health care provider. Make sure you discuss any questions you have with your health care provider. Document Revised: 06/02/2019 Document Reviewed: 10/11/2016 Elsevier Patient Education  2020 [employer] Inc[employer]  
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
## 2020-05-18 ENCOUNTER — Encounter: Payer: Self-pay | Admitting: Pulmonary Disease

## 2020-05-18 ENCOUNTER — Ambulatory Visit (INDEPENDENT_AMBULATORY_CARE_PROVIDER_SITE_OTHER): Payer: Medicare Other

## 2020-05-18 ENCOUNTER — Other Ambulatory Visit: Payer: Self-pay

## 2020-05-18 ENCOUNTER — Ambulatory Visit (INDEPENDENT_AMBULATORY_CARE_PROVIDER_SITE_OTHER): Payer: Medicare Other | Admitting: Pulmonary Disease

## 2020-05-18 VITALS — BP 120/60 | HR 61 | Temp 97.3°F | Ht 63.0 in | Wt 148.4 lb

## 2020-05-18 DIAGNOSIS — J90 Pleural effusion, not elsewhere classified: Secondary | ICD-10-CM

## 2020-05-18 DIAGNOSIS — J9859 Other diseases of mediastinum, not elsewhere classified: Secondary | ICD-10-CM
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
## 2020-05-18 NOTE — Patient Instructions (Signed)
Pleural effusion has resolved on x-ray Increase Lasix to 40 mg daily for 7 days then back down to 20 mg and see if this helps with your breathing Leg swelling may also be related to amlodipine

## 2020-05-18 NOTE — Assessment & Plan Note (Signed)
Resolved. Prior biopsy of inguinal lymph node has shown benign etiology

## 2020-05-18 NOTE — Assessment & Plan Note (Signed)
Pleural effusion has resolved on x-ray Increase Lasix to 40 mg daily for 7 days then back down to 20 mg and see if this helps with your breathing Leg swelling may also be related to amlodipine

## 2020-05-22 ENCOUNTER — Other Ambulatory Visit: Payer: Self-pay

## 2020-05-22 ENCOUNTER — Encounter (INDEPENDENT_AMBULATORY_CARE_PROVIDER_SITE_OTHER): Payer: Medicare Other | Admitting: Ophthalmology

## 2020-05-22 DIAGNOSIS — E11311 Type 2 diabetes mellitus with unspecified diabetic retinopathy with macular edema: Secondary | ICD-10-CM | POA: Diagnosis not present

## 2020-05-22 DIAGNOSIS — H43813 Vitreous degeneration, bilateral: Secondary | ICD-10-CM | POA: Diagnosis not present

## 2020-05-22 DIAGNOSIS — E113313 Type 2 diabetes mellitus with moderate nonproliferative diabetic retinopathy with macular edema, bilateral: Secondary | ICD-10-CM | POA: Diagnosis not present

## 2020-07-11 ENCOUNTER — Other Ambulatory Visit: Payer: Self-pay | Admitting: Podiatry

## 2020-07-11 NOTE — Telephone Encounter (Signed)
Please advise 

## 2020-07-22 ENCOUNTER — Ambulatory Visit: Payer: Medicare Other | Attending: Internal Medicine

## 2020-07-22 DIAGNOSIS — Z23 Encounter for immunization: Secondary | ICD-10-CM

## 2020-07-22 NOTE — Progress Notes (Signed)
° °  Covid-19 Vaccination Clinic  Name:  [name]    MRN: [id number] DOB: Feb 16, 1956  07/22/2020  [name] was observed post Covid-19 immunization for 15 minutes without incident. She was provided with Vaccine Information Sheet and instruction to access the V-Safe system.   [name] was instructed to call 911 with any severe reactions post vaccine:  Difficulty breathing   Swelling of face and throat   A fast heartbeat   A bad rash all over body   Dizziness and weakness

## 2020-07-25 ENCOUNTER — Ambulatory Visit: Payer: Medicare Other | Admitting: Podiatry

## 2020-08-07 ENCOUNTER — Other Ambulatory Visit: Payer: Self-pay

## 2020-08-07 ENCOUNTER — Encounter (INDEPENDENT_AMBULATORY_CARE_PROVIDER_SITE_OTHER): Payer: Medicare Other | Admitting: Ophthalmology

## 2020-08-07 DIAGNOSIS — E113313 Type 2 diabetes mellitus with moderate nonproliferative diabetic retinopathy with macular edema, bilateral: Secondary | ICD-10-CM | POA: Diagnosis not present

## 2020-08-07 DIAGNOSIS — H43813 Vitreous degeneration, bilateral: Secondary | ICD-10-CM

## 2020-08-07 DIAGNOSIS — E11311 Type 2 diabetes mellitus with unspecified diabetic retinopathy with macular edema: Secondary | ICD-10-CM | POA: Diagnosis not present

## 2020-08-14 ENCOUNTER — Other Ambulatory Visit: Payer: Self-pay

## 2020-08-14 ENCOUNTER — Ambulatory Visit (INDEPENDENT_AMBULATORY_CARE_PROVIDER_SITE_OTHER): Payer: Medicare Other | Admitting: Podiatry

## 2020-08-14 DIAGNOSIS — E1149 Type 2 diabetes mellitus with other diabetic neurological complication: Secondary | ICD-10-CM | POA: Diagnosis not present

## 2020-08-14 DIAGNOSIS — E119 Type 2 diabetes mellitus without complications: Secondary | ICD-10-CM | POA: Diagnosis not present
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
## 2020-08-30 ENCOUNTER — Other Ambulatory Visit: Payer: Self-pay | Admitting: Adult Health

## 2020-08-31 IMAGING — CT CT HEAD WITHOUT CONTRAST
4 series · 16 of 47 positions shown, 18 images · non-contrast
Comparison: 05/31/2010

CLINICAL DATA: Altered mental status

EXAM:
CT HEAD WITHOUT CONTRAST
TECHNIQUE: Contiguous axial images were obtained from the base of the skull
through the vertex without intravenous contrast.

[Series 3: head without · axial · non-contrast · 0.43mm/px · z∈[-117,+13]mm · 7 of 36 slices shown, 9 images]
[im 5/36  brain]
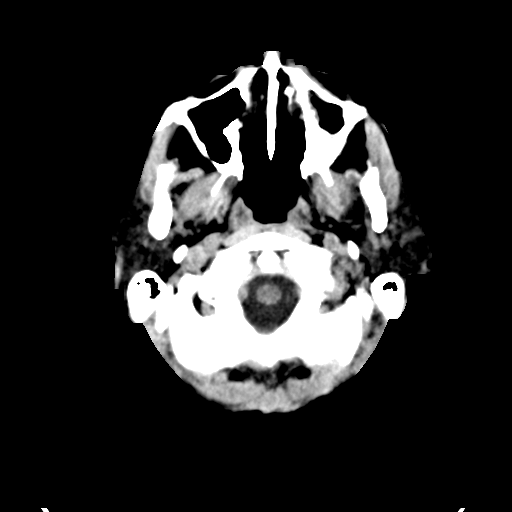
[im 5/36  bone]
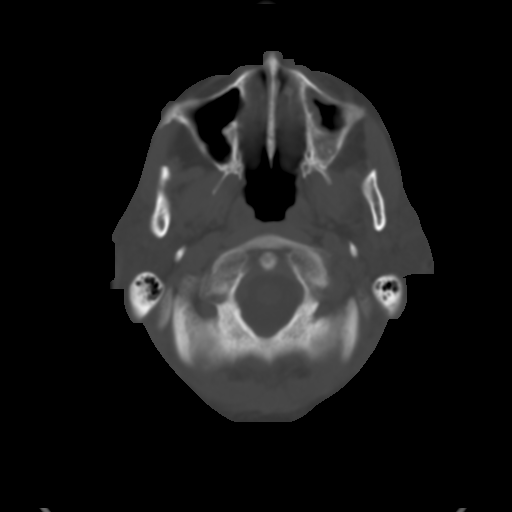
[im 9/36  brain]
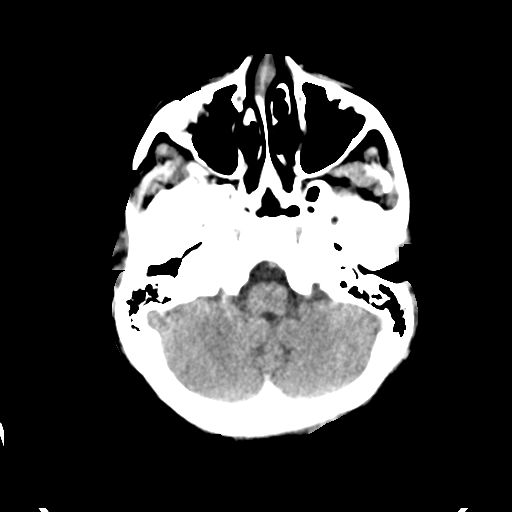
[im 14/36  brain]
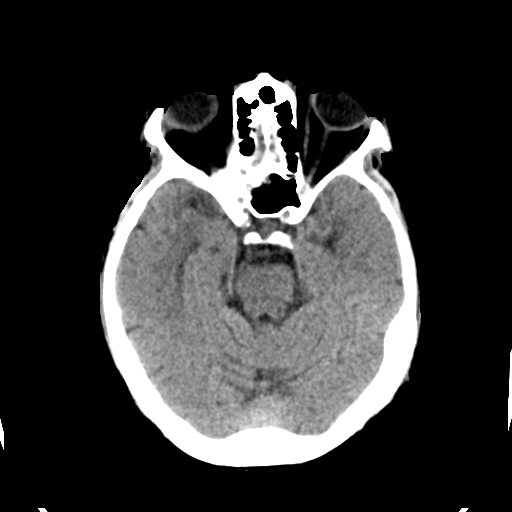
[im 18/36  brain]
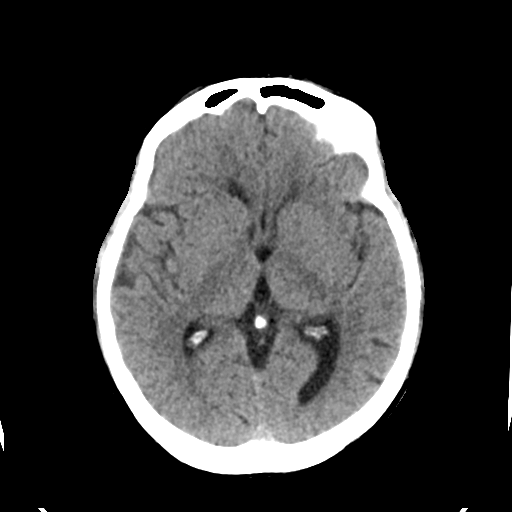
[im 22/36  brain]
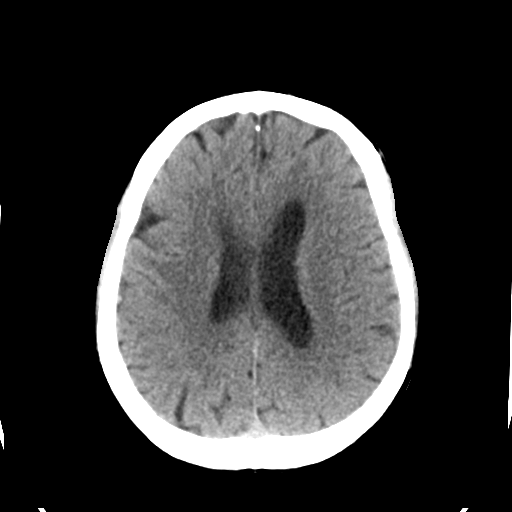
[im 22/36  bone]
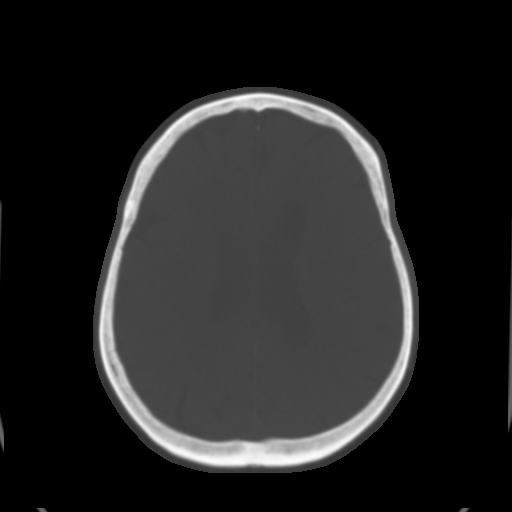
[im 27/36  brain]
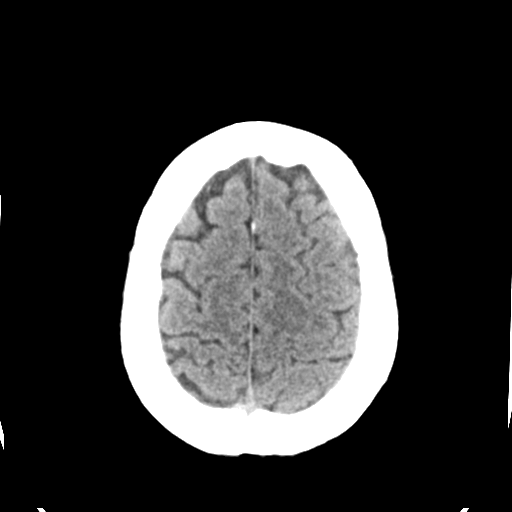
[im 31/36  brain]
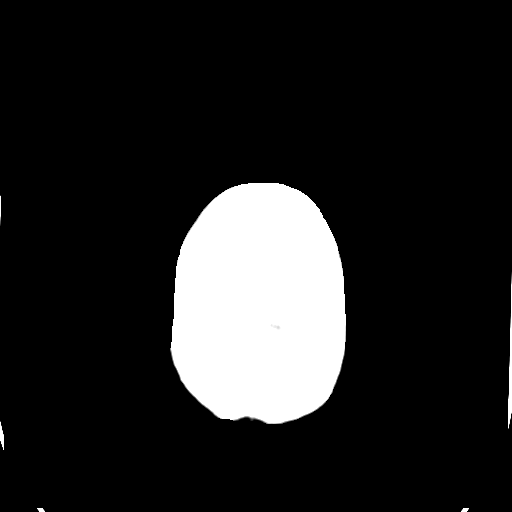

[Series 4: head bone · axial · 0.43mm/px · z∈[-121,-85]mm · 3 of 89 slices shown]
[im 9/89  bone]
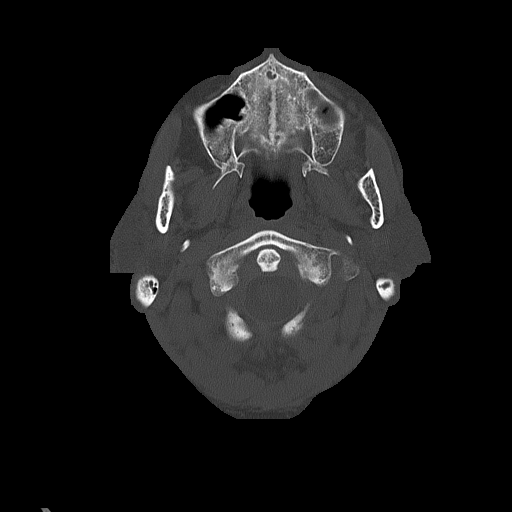
[im 18/89  bone]
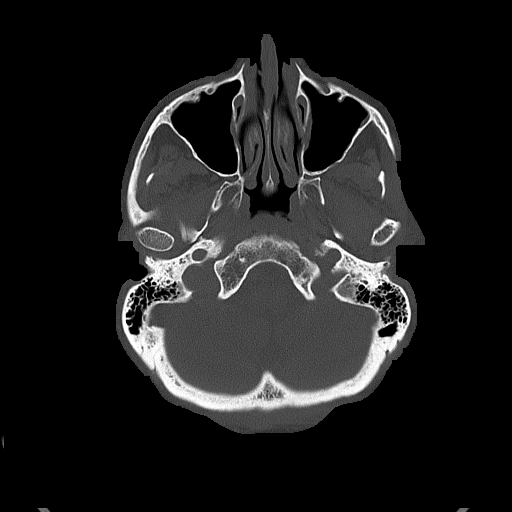
[im 27/89  bone]
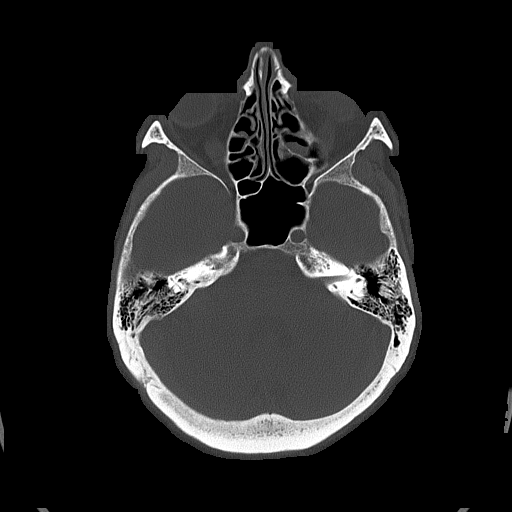

[Series 5: head without cor · coronal · non-contrast · 0.34mm/px · 3 of 68 slices shown]
[im 23/68  brain]
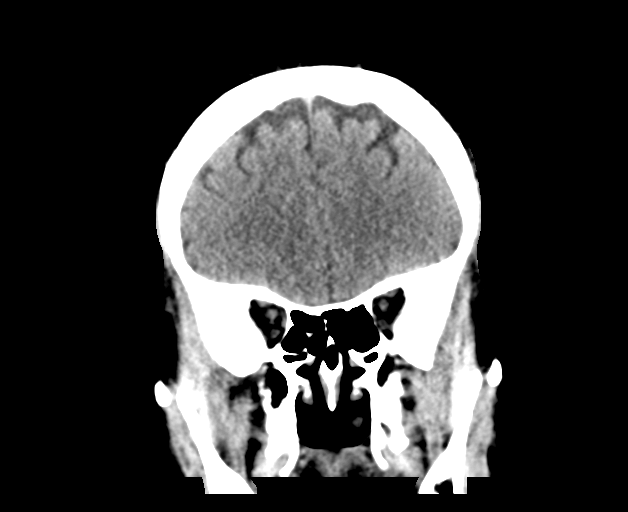
[im 30/68  brain]
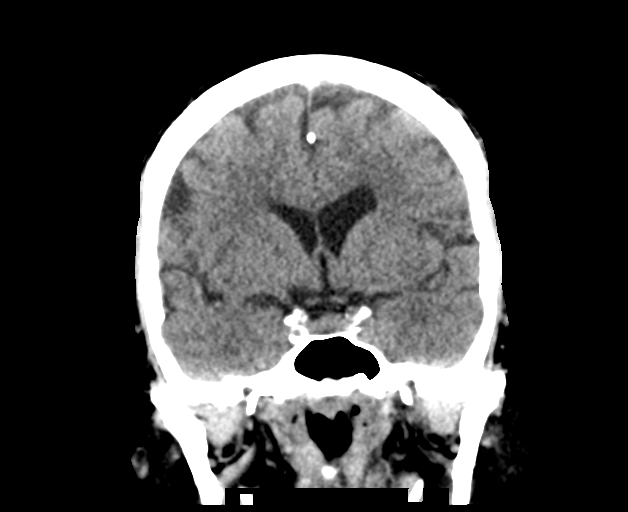
[im 38/68  brain]
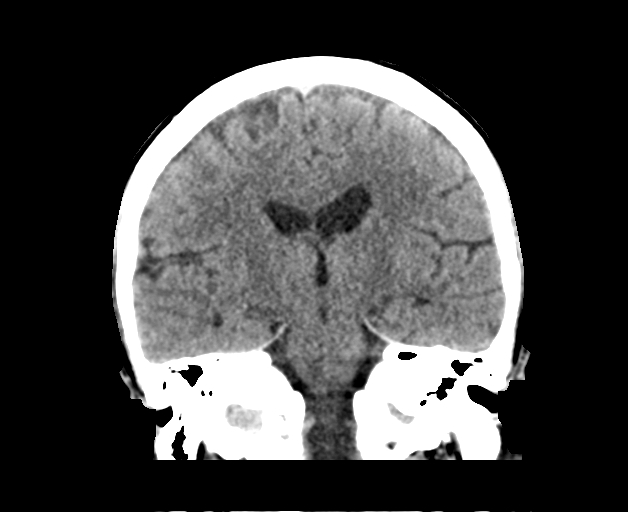

[Series 6: head without sag · sagittal · non-contrast · 0.36mm/px · 3 of 66 slices shown]
[im 22/66  brain]
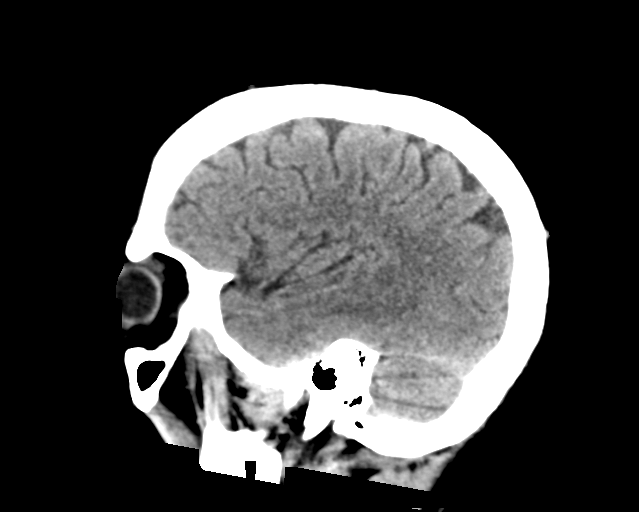
[im 33/66  brain]
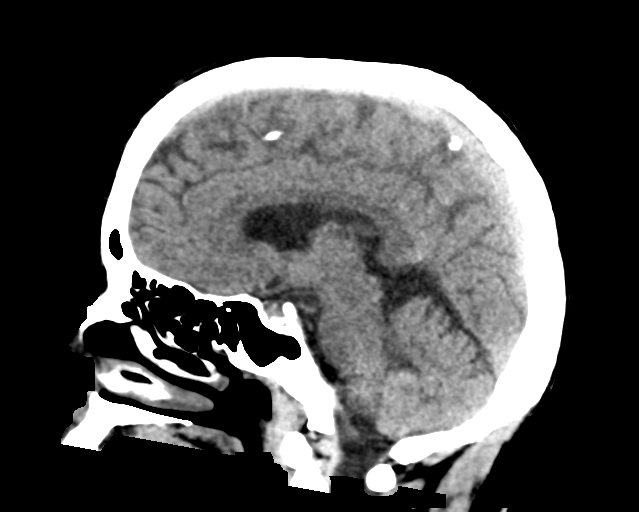
[im 44/66  brain]
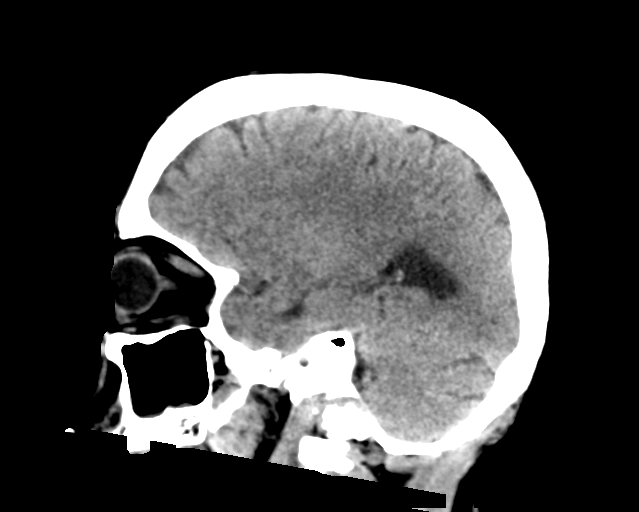

[16 of 47 positions shown; findings below may reference images not displayed]

FINDINGS: Brain: No evidence of acute infarction, hemorrhage, hydrocephalus,
extra-axial collection or mass lesion/mass effect.

Vascular: No hyperdense vessel or unexpected calcification.

Skull: Normal. Negative for fracture or focal lesion.

Sinuses/Orbits: No acute finding.

Other: None.
IMPRESSION: No acute intracranial pathology.

## 2020-10-16 ENCOUNTER — Ambulatory Visit: Payer: Medicare Other | Admitting: Podiatry

## 2020-10-30 ENCOUNTER — Encounter (INDEPENDENT_AMBULATORY_CARE_PROVIDER_SITE_OTHER): Payer: Medicare Other | Admitting: Ophthalmology

## 2020-10-30 ENCOUNTER — Other Ambulatory Visit: Payer: Self-pay

## 2020-10-30 DIAGNOSIS — H43813 Vitreous degeneration, bilateral: Secondary | ICD-10-CM

## 2020-10-30 DIAGNOSIS — E113313 Type 2 diabetes mellitus with moderate nonproliferative diabetic retinopathy with macular edema, bilateral: Secondary | ICD-10-CM | POA: Diagnosis not present

## 2020-11-09 ENCOUNTER — Ambulatory Visit (INDEPENDENT_AMBULATORY_CARE_PROVIDER_SITE_OTHER): Payer: Medicare Other | Admitting: Adult Health

## 2020-11-09 ENCOUNTER — Encounter: Payer: Self-pay | Admitting: Adult Health

## 2020-11-09 ENCOUNTER — Other Ambulatory Visit: Payer: Self-pay

## 2020-11-09 DIAGNOSIS — J90 Pleural effusion, not elsewhere classified: Secondary | ICD-10-CM | POA: Diagnosis not present

## 2020-11-09 DIAGNOSIS — R600 Localized edema: Secondary | ICD-10-CM | POA: Diagnosis not present

## 2020-11-09 NOTE — Assessment & Plan Note (Signed)
No recurrence. CXR 04/2020 clear .

## 2020-11-09 NOTE — Patient Instructions (Addendum)
Continue on Lasix  20mg  daily  Low salt diet .  Elevate legs  Activity as tolerated.  Follow up with Dr. Elsworth Soho  In 1 year and As needed   Please contact office for sooner follow up if symptoms do not improve or worsen or seek emergency care

## 2020-11-09 NOTE — Assessment & Plan Note (Addendum)
Much improved on lasix and TEDS hose.  Low salt diet   Plan  Patient Instructions  Continue on Lasix  20mg  daily  Low salt diet .  Elevate legs  Activity as tolerated.  Follow up with Dr. Elsworth Soho  In 1 year and As needed   Please contact office for sooner follow up if symptoms do not improve or worsen or seek emergency care     '
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
## 2020-11-23 ENCOUNTER — Ambulatory Visit: Payer: Medicare Other | Admitting: Adult Health

## 2020-12-18 ENCOUNTER — Other Ambulatory Visit: Payer: Self-pay | Admitting: Podiatry

## 2020-12-18 ENCOUNTER — Telehealth: Payer: Self-pay | Admitting: Podiatry

## 2020-12-18 MED ORDER — PREGABALIN 150 MG PO CAPS: ORAL_CAPSULE | ORAL | 0 refills | Status: DC

## 2020-12-18 NOTE — Telephone Encounter (Signed)
The following patient has requested refill on Lyrica and also the new pharmacy has been updated in the patients chart to reflect, Please Advise

## 2020-12-18 NOTE — Telephone Encounter (Signed)
sent 

## 2021-01-11 ENCOUNTER — Encounter: Payer: Medicare Other | Attending: Internal Medicine | Admitting: Dietician

## 2021-01-15 ENCOUNTER — Ambulatory Visit: Payer: Medicare Other | Admitting: Podiatry

## 2021-01-22 ENCOUNTER — Other Ambulatory Visit: Payer: Self-pay

## 2021-01-22 ENCOUNTER — Encounter (INDEPENDENT_AMBULATORY_CARE_PROVIDER_SITE_OTHER): Payer: Medicare Other | Admitting: Ophthalmology

## 2021-01-22 DIAGNOSIS — E113313 Type 2 diabetes mellitus with moderate nonproliferative diabetic retinopathy with macular edema, bilateral: Secondary | ICD-10-CM | POA: Diagnosis not present

## 2021-01-22 DIAGNOSIS — H43813 Vitreous degeneration, bilateral: Secondary | ICD-10-CM

## 2021-02-06 ENCOUNTER — Other Ambulatory Visit: Payer: Self-pay

## 2021-02-06 ENCOUNTER — Ambulatory Visit (INDEPENDENT_AMBULATORY_CARE_PROVIDER_SITE_OTHER): Payer: Medicare Other | Admitting: Podiatry

## 2021-02-06 ENCOUNTER — Encounter: Payer: Self-pay | Admitting: Podiatry

## 2021-02-06 DIAGNOSIS — B351 Tinea unguium: Secondary | ICD-10-CM | POA: Diagnosis not present

## 2021-02-06 DIAGNOSIS — Z789 Other specified health status: Secondary | ICD-10-CM | POA: Insufficient documentation

## 2021-02-06 DIAGNOSIS — R1900 Intra-abdominal and pelvic swelling, mass and lump, unspecified site: Secondary | ICD-10-CM | POA: Insufficient documentation

## 2021-02-06 DIAGNOSIS — D72820 Lymphocytosis (symptomatic): Secondary | ICD-10-CM | POA: Insufficient documentation

## 2021-02-06 DIAGNOSIS — Z87891 Personal history of nicotine dependence: Secondary | ICD-10-CM | POA: Insufficient documentation

## 2021-02-06 DIAGNOSIS — Z90411 Acquired partial absence of pancreas: Secondary | ICD-10-CM | POA: Insufficient documentation

## 2021-02-06 DIAGNOSIS — E1149 Type 2 diabetes mellitus with other diabetic neurological complication: Secondary | ICD-10-CM | POA: Diagnosis not present

## 2021-02-06 DIAGNOSIS — F339 Major depressive disorder, recurrent, unspecified: Secondary | ICD-10-CM | POA: Insufficient documentation

## 2021-02-06 DIAGNOSIS — G8929 Other chronic pain: Secondary | ICD-10-CM | POA: Insufficient documentation

## 2021-02-06 DIAGNOSIS — F419 Anxiety disorder, unspecified: Secondary | ICD-10-CM | POA: Insufficient documentation

## 2021-02-06 DIAGNOSIS — M79674 Pain in right toe(s): Secondary | ICD-10-CM | POA: Diagnosis not present

## 2021-02-06 DIAGNOSIS — E1165 Type 2 diabetes mellitus with hyperglycemia: Secondary | ICD-10-CM | POA: Insufficient documentation

## 2021-02-06 DIAGNOSIS — Z8541 Personal history of malignant neoplasm of cervix uteri: Secondary | ICD-10-CM | POA: Insufficient documentation

## 2021-02-06 DIAGNOSIS — Z8639 Personal history of other endocrine, nutritional and metabolic disease: Secondary | ICD-10-CM | POA: Insufficient documentation

## 2021-02-06 DIAGNOSIS — I5032 Chronic diastolic (congestive) heart failure: Secondary | ICD-10-CM | POA: Insufficient documentation

## 2021-02-06 DIAGNOSIS — E1142 Type 2 diabetes mellitus with diabetic polyneuropathy: Secondary | ICD-10-CM | POA: Insufficient documentation

## 2021-02-06 DIAGNOSIS — Z7409 Other reduced mobility: Secondary | ICD-10-CM | POA: Insufficient documentation

## 2021-02-06 DIAGNOSIS — E041 Nontoxic single thyroid nodule: Secondary | ICD-10-CM | POA: Insufficient documentation

## 2021-02-06 DIAGNOSIS — I739 Peripheral vascular disease, unspecified: Secondary | ICD-10-CM | POA: Insufficient documentation

## 2021-02-06 DIAGNOSIS — R935 Abnormal findings on diagnostic imaging of other abdominal regions, including retroperitoneum: Secondary | ICD-10-CM | POA: Insufficient documentation

## 2021-02-06 DIAGNOSIS — E1121 Type 2 diabetes mellitus with diabetic nephropathy: Secondary | ICD-10-CM | POA: Insufficient documentation

## 2021-02-06 DIAGNOSIS — C539 Malignant neoplasm of cervix uteri, unspecified: Secondary | ICD-10-CM | POA: Insufficient documentation

## 2021-02-06 DIAGNOSIS — M79675 Pain in left toe(s): Secondary | ICD-10-CM

## 2021-02-06 DIAGNOSIS — J302 Other seasonal allergic rhinitis: Secondary | ICD-10-CM | POA: Insufficient documentation

## 2021-02-06 DIAGNOSIS — N941 Unspecified dyspareunia: Secondary | ICD-10-CM | POA: Insufficient documentation

## 2021-02-06 DIAGNOSIS — R591 Generalized enlarged lymph nodes: Secondary | ICD-10-CM | POA: Insufficient documentation

## 2021-02-11 NOTE — Progress Notes (Signed)
Subjective: 65 year old female presents the office today for concerns of nails becoming thickened discolored most on her first and second toenails on the right foot.  All of her nails are thick and discolored and they cause discomfort at times.  She denies any drainage or pus any swelling or redness.  Denies any ulcerations. Denies any systemic complaints such as fevers, chills, nausea, vomiting. No acute changes since last appointment, and no other complaints at this time.   Since I last saw her she got new diabetic shoes which have been very comfortable for her.  Also her insulin has been changed.  That seems to be helping.  [place], [place], [place]   Objective: AAO x3, NAD DP/PT pulses palpable bilaterally, CRT less than 3 seconds Nails are hypertrophic, dystrophic, brittle, discolored, elongated 10.  Most notably her first and second toenails on the right foot.  No surrounding redness or drainage. Tenderness nails 1-5 bilaterally. No open lesions or pre-ulcerative lesions are identified today.  No pain with calf compression, swelling, warmth, erythema  Assessment: Symptomatic onychomycosis  Plan: -All treatment options discussed with the patient including all alternatives, risks, complications.  -Discussed with her nail removal but she wished to hold off on this today.  I sharply debrided the nails x10 without any complications or bleeding. -Patient encouraged to call the office with any questions, concerns, change in symptoms.   Trula Slade DPM

## 2021-02-22 ENCOUNTER — Other Ambulatory Visit: Payer: Self-pay

## 2021-02-22 ENCOUNTER — Encounter: Payer: Self-pay | Admitting: Surgery

## 2021-02-22 ENCOUNTER — Ambulatory Visit: Payer: Self-pay

## 2021-02-22 ENCOUNTER — Ambulatory Visit (INDEPENDENT_AMBULATORY_CARE_PROVIDER_SITE_OTHER): Payer: Medicare Other | Admitting: Surgery

## 2021-02-22 DIAGNOSIS — M25552 Pain in left hip: Secondary | ICD-10-CM

## 2021-02-22 DIAGNOSIS — M7062 Trochanteric bursitis, left hip: Secondary | ICD-10-CM

## 2021-02-22 MED ORDER — BUPIVACAINE HCL 0.25 % IJ SOLN
6.0000 mL | INTRAMUSCULAR | Status: AC | PRN
  Administered 2021-02-22: 6 mL via INTRA_ARTICULAR

## 2021-02-22 MED ORDER — LIDOCAINE HCL 1 % IJ SOLN
3.0000 mL | INTRAMUSCULAR | Status: AC | PRN
Start: 2021-02-22 — End: 2021-02-22
  Administered 2021-02-22: 3 mL
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
## 2021-03-21 ENCOUNTER — Ambulatory Visit: Payer: Medicare Other | Admitting: Orthopaedic Surgery

## 2021-04-06 ENCOUNTER — Other Ambulatory Visit: Payer: Self-pay | Admitting: Podiatry

## 2021-04-13 ENCOUNTER — Encounter (INDEPENDENT_AMBULATORY_CARE_PROVIDER_SITE_OTHER): Payer: Medicare Other | Admitting: Ophthalmology

## 2021-04-13 ENCOUNTER — Other Ambulatory Visit: Payer: Self-pay

## 2021-04-13 DIAGNOSIS — H43813 Vitreous degeneration, bilateral: Secondary | ICD-10-CM

## 2021-04-13 DIAGNOSIS — E113313 Type 2 diabetes mellitus with moderate nonproliferative diabetic retinopathy with macular edema, bilateral: Secondary | ICD-10-CM | POA: Diagnosis not present

## 2021-04-26 ENCOUNTER — Other Ambulatory Visit: Payer: Self-pay | Admitting: Physician Assistant

## 2021-04-26 DIAGNOSIS — Z1231 Encounter for screening mammogram for malignant neoplasm of breast: Secondary | ICD-10-CM

## 2021-05-10 ENCOUNTER — Ambulatory Visit (INDEPENDENT_AMBULATORY_CARE_PROVIDER_SITE_OTHER): Payer: Medicare Other | Admitting: Podiatry

## 2021-05-10 ENCOUNTER — Other Ambulatory Visit: Payer: Self-pay

## 2021-05-10 DIAGNOSIS — M79675 Pain in left toe(s): Secondary | ICD-10-CM | POA: Diagnosis not present

## 2021-05-10 DIAGNOSIS — M2041 Other hammer toe(s) (acquired), right foot: Secondary | ICD-10-CM

## 2021-05-10 DIAGNOSIS — M79674 Pain in right toe(s): Secondary | ICD-10-CM

## 2021-05-10 DIAGNOSIS — M2042 Other hammer toe(s) (acquired), left foot: Secondary | ICD-10-CM

## 2021-05-10 DIAGNOSIS — B351 Tinea unguium: Secondary | ICD-10-CM | POA: Diagnosis not present

## 2021-05-10 DIAGNOSIS — E1149 Type 2 diabetes mellitus with other diabetic neurological complication: Secondary | ICD-10-CM | POA: Diagnosis not present

## 2021-05-10 DIAGNOSIS — Z872 Personal history of diseases of the skin and subcutaneous tissue: Secondary | ICD-10-CM

## 2021-05-12 NOTE — Progress Notes (Signed)
Subjective: 65 year old female presents the office today for concerns of nails becoming thickened discolored most on her first and second toenails on the right foot.  She denies any swelling or redness or any drainage of the toenail sites.  In particular her right second digit toenail is thick and difficult to trim.  She is also interested in diabetic shoes.  No open sores that she reports.   [place], [place], [place]   Objective: AAO x3, NAD DP/PT pulses palpable bilaterally, CRT less than 3 seconds Sensation decreased with Semmes Weinstein monofilament. History of ulcerations of the right foot which are well-healed. Hammertoes. Nails are hypertrophic, dystrophic, brittle, discolored, elongated 10.  Most notably her first and second toenails on the right foot.  No surrounding redness or drainage. Tenderness nails 1-5 bilaterally. No open lesions or pre-ulcerative lesions are identified today.  No pain with calf compression, swelling, warmth, erythema  Assessment: Symptomatic onychomycosis  Plan: -All treatment options discussed with the patient including all alternatives, risks, complications.  -Discussed nail removal however at this time is not causing pain is more just because of the thickening she was interested in doing this previously.  Given her diabetes and history of slow healing wounds we will hold off on that unless absolutely necessary.  I sharply debrided the nails x10 without any complications or bleeding. -We will have her follow-up for diabetic shoe measurements. -Patient encouraged to call the office with any questions, concerns, change in symptoms.   Trula Slade DPM

## 2021-05-14 ENCOUNTER — Other Ambulatory Visit: Payer: Medicare Other

## 2021-06-15 ENCOUNTER — Ambulatory Visit
Admission: RE | Admit: 2021-06-15 | Discharge: 2021-06-15 | Disposition: A | Discharge status: Home / Self Care | Payer: Medicare Other | Source: Ambulatory Visit | Attending: Physician Assistant | Admitting: Physician Assistant

## 2021-06-15 ENCOUNTER — Other Ambulatory Visit: Payer: Self-pay

## 2021-06-15 DIAGNOSIS — Z1231 Encounter for screening mammogram for malignant neoplasm of breast: Secondary | ICD-10-CM

## 2021-07-06 ENCOUNTER — Encounter (INDEPENDENT_AMBULATORY_CARE_PROVIDER_SITE_OTHER): Payer: Medicare Other | Admitting: Ophthalmology

## 2021-07-09 ENCOUNTER — Ambulatory Visit (INDEPENDENT_AMBULATORY_CARE_PROVIDER_SITE_OTHER): Payer: Medicare Other | Admitting: Podiatry

## 2021-07-09 ENCOUNTER — Other Ambulatory Visit: Payer: Self-pay

## 2021-07-09 DIAGNOSIS — L97521 Non-pressure chronic ulcer of other part of left foot limited to breakdown of skin: Secondary | ICD-10-CM | POA: Diagnosis not present

## 2021-07-09 DIAGNOSIS — L03032 Cellulitis of left toe: Secondary | ICD-10-CM | POA: Diagnosis not present

## 2021-07-09 DIAGNOSIS — B351 Tinea unguium: Secondary | ICD-10-CM

## 2021-07-09 DIAGNOSIS — M79675 Pain in left toe(s): Secondary | ICD-10-CM | POA: Diagnosis not present

## 2021-07-09 DIAGNOSIS — L02612 Cutaneous abscess of left foot: Secondary | ICD-10-CM

## 2021-07-09 DIAGNOSIS — M79674 Pain in right toe(s): Secondary | ICD-10-CM | POA: Diagnosis not present

## 2021-07-09 DIAGNOSIS — E1149 Type 2 diabetes mellitus with other diabetic neurological complication: Secondary | ICD-10-CM

## 2021-07-09 MED ORDER — MUPIROCIN 2 % EX OINT: 1.0000 "application " | TOPICAL_OINTMENT | Freq: Two times a day (BID) | CUTANEOUS | 2 refills | Status: DC

## 2021-07-09 MED ORDER — SULFAMETHOXAZOLE-TRIMETHOPRIM 800-160 MG PO TABS: 1.0000 | ORAL_TABLET | Freq: Two times a day (BID) | ORAL | 0 refills | Status: DC

## 2021-07-10 NOTE — Progress Notes (Signed)
Subjective: 65 year old female presents the office today originally for concerns of nails becoming thickened discolored that she cannot trim her self however she noticed over the last couple days the wound is forming the left fifth toe.  She states the nail came off about a couple days ago as well.  She is tried soaking and applying Neosporin.  She noticed some purulent drainage previously.  She said last night also cleaned with peroxide.  She says it may have started after she changed shoes.  No red streaks.  Denies any fevers or chills.  No other concerns.    [place], [place], [place]  Objective: AAO x3, NAD DP/PT pulses palpable bilaterally, CRT less than 3 seconds Sensation decreased with Semmes Weinstein monofilament. On the left fifth toe the nail is no longer present.  There is a superficial granular wound present on the left fifth toe.  Localized edema erythema to the toe without any ascending cellulitis.  No fluctuance or crepitation.  No malodor. No other open lesions. Nails are hypertrophic, dystrophic, brittle, discolored, elongated 10.  Most notably her first and second toenails on the right foot.  No surrounding redness or drainage. Tenderness nails 1-5 bilaterally except the left fifth toe. No open lesions or pre-ulcerative lesions are identified today. No pain with calf compression, swelling, warmth, erythema  Assessment: Symptomatic onychomycosis; left fifth toe ulceration, cellulitis  Plan: -All treatment options discussed with the patient including all alternatives, risks, complications.  -Wound appears to be superficial.  Recommended Pearson ointment dressing changes as ordered today.  We will also start Bactrim.  Surgical shoe for offloading. -Sharply debrided the nails x9 without any complications or bleeding. -Patient encouraged to call the office with any questions, concerns, change in symptoms.   *X-ray left foot next appointment of the wound, infection still  present  Trula Slade DPM

## 2021-07-12 ENCOUNTER — Other Ambulatory Visit: Payer: Self-pay | Admitting: Podiatry

## 2021-07-12 NOTE — Telephone Encounter (Signed)
Please advise 

## 2021-07-16 ENCOUNTER — Other Ambulatory Visit: Payer: Self-pay | Admitting: Podiatry

## 2021-07-16 ENCOUNTER — Telehealth: Payer: Self-pay | Admitting: *Deleted

## 2021-07-16 MED ORDER — PREGABALIN 150 MG PO CAPS: 150.0000 mg | ORAL_CAPSULE | Freq: Every day | ORAL | 0 refills | Status: DC

## 2021-07-16 NOTE — Telephone Encounter (Signed)
Called and spoke with the patient per Dr Jacqualyn Posey. Lattie Haw

## 2021-07-16 NOTE — Telephone Encounter (Signed)
Patient is calling to check on the status of a prescription for Pregabalin-150 mg , did not go thru to pharmacy, please resend.to pharmacy on file.

## 2021-07-24 ENCOUNTER — Ambulatory Visit: Payer: Medicare Other | Admitting: Podiatry

## 2021-08-01 ENCOUNTER — Encounter (INDEPENDENT_AMBULATORY_CARE_PROVIDER_SITE_OTHER): Payer: Medicare Other | Admitting: Ophthalmology

## 2021-08-07 ENCOUNTER — Other Ambulatory Visit: Payer: Self-pay

## 2021-08-07 ENCOUNTER — Ambulatory Visit (INDEPENDENT_AMBULATORY_CARE_PROVIDER_SITE_OTHER): Payer: Medicare Other

## 2021-08-07 ENCOUNTER — Ambulatory Visit (INDEPENDENT_AMBULATORY_CARE_PROVIDER_SITE_OTHER): Payer: Medicare Other | Admitting: Podiatry

## 2021-08-07 DIAGNOSIS — L02612 Cutaneous abscess of left foot: Secondary | ICD-10-CM

## 2021-08-07 DIAGNOSIS — L97521 Non-pressure chronic ulcer of other part of left foot limited to breakdown of skin: Secondary | ICD-10-CM

## 2021-08-07 DIAGNOSIS — L03032 Cellulitis of left toe: Secondary | ICD-10-CM

## 2021-08-14 NOTE — Progress Notes (Signed)
Subjective: 65 year old female presents the office today for follow-up evaluation of a wound on the left fifth toe.  She is complete the course of Bactrim.  She did not apply mupirocin ointment on the wound.  She seems is getting better.  Less redness and swelling.  There is no drainage or pus..  The area is scabbed over that she reports.  She has no fevers or chills that she reports.  No other concerns.  Objective: AAO x3, NAD DP/PT pulses palpable bilaterally, CRT less than 3 seconds Scab is present on nail bed on the left fifth toe.  There is some mild erythema present I think this is where the skin is peeled off there is new, healthy skin present.  There is no drainage or pus.  There is no warmth.  No ascending cellulitis.  There is no fluctuation or crepitation there is no malodor.  No pain with calf compression, swelling, warmth, erythema  Assessment: Healing wound left fifth toe with resolving cellulitis  Plan: -All treatment options discussed with the patient including all alternatives, risks, complications.  -X-rays obtained reviewed.  No definite evidence of acute fracture, osteomyelitis. -I would continue with small amount of mupirocin ointment dressing changes daily and offloading.  Continue monitoring recurrence any worsening or reoccurrence of infection.  If there is any change to let me know. -Patient encouraged to call the office with any questions, concerns, change in symptoms.   RTC 3-4 weeks or sooner if needed  Trula Slade DPM

## 2021-08-20 ENCOUNTER — Other Ambulatory Visit: Payer: Self-pay

## 2021-08-20 ENCOUNTER — Encounter (INDEPENDENT_AMBULATORY_CARE_PROVIDER_SITE_OTHER): Payer: Medicare Other | Admitting: Ophthalmology

## 2021-08-20 DIAGNOSIS — E113312 Type 2 diabetes mellitus with moderate nonproliferative diabetic retinopathy with macular edema, left eye: Secondary | ICD-10-CM

## 2021-08-20 DIAGNOSIS — E113391 Type 2 diabetes mellitus with moderate nonproliferative diabetic retinopathy without macular edema, right eye: Secondary | ICD-10-CM | POA: Diagnosis not present

## 2021-08-20 DIAGNOSIS — H43813 Vitreous degeneration, bilateral: Secondary | ICD-10-CM

## 2021-08-30 ENCOUNTER — Ambulatory Visit (INDEPENDENT_AMBULATORY_CARE_PROVIDER_SITE_OTHER): Payer: Medicare Other | Admitting: Podiatry

## 2021-08-30 ENCOUNTER — Other Ambulatory Visit: Payer: Self-pay

## 2021-08-30 DIAGNOSIS — L539 Erythematous condition, unspecified: Secondary | ICD-10-CM | POA: Diagnosis not present

## 2021-08-30 DIAGNOSIS — L97521 Non-pressure chronic ulcer of other part of left foot limited to breakdown of skin: Secondary | ICD-10-CM | POA: Diagnosis not present

## 2021-09-02 NOTE — Progress Notes (Signed)
Subjective: 65 year old female presents the office today for follow-up evaluation of a wound on the left fifth toe.  She states the toe is doing better.  No open sores she reports no drainage.  No increase in swelling.  She states that she wore a pair of rain boots yesterday that rubbed her fifth toe.  She states that because of the position of her toe from her wrist fracture before it was rubbing the boots to It is a little red today.  Otherwise denies any red streaks.  No fevers or chills.  She has no other concerns.  Objective: AAO x3, NAD DP/PT pulses palpable bilaterally, CRT less than 3 seconds Sensation decreased with Semmes Weinstein monofilament. This Is resolved.  There is no open sore present left fifth toe.  There is slight erythema but is blanchable and there is no warmth, ascending cellulitis.  There is no open sore identified.  No tenderness.  No open lesions otherwise bilaterally. No pain with calf compression, swelling, warmth, erythema  Assessment: Healed wound left fifth toe with resolving cellulitis; neuropathy  Plan: -All treatment options discussed with the patient including all alternatives, risks, complications.  -We discussed wearing shoes that avoids any pressure on the toe.  Dispensed offloading pads for the toe as well.  Discussed daily foot inspection should there be any issues to let me know immediately.  Return in about 2 months (around 10/31/2021).  Trula Slade DPM

## 2021-09-20 ENCOUNTER — Encounter: Payer: Self-pay | Admitting: Surgery

## 2021-09-20 ENCOUNTER — Other Ambulatory Visit: Payer: Self-pay

## 2021-09-20 ENCOUNTER — Ambulatory Visit: Payer: Self-pay

## 2021-09-20 ENCOUNTER — Ambulatory Visit (INDEPENDENT_AMBULATORY_CARE_PROVIDER_SITE_OTHER): Payer: Medicare Other | Admitting: Surgery

## 2021-09-20 DIAGNOSIS — M25562 Pain in left knee: Secondary | ICD-10-CM

## 2021-09-20 DIAGNOSIS — S8392XA Sprain of unspecified site of left knee, initial encounter: Secondary | ICD-10-CM | POA: Diagnosis not present
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
## 2021-10-04 ENCOUNTER — Ambulatory Visit: Payer: BC Managed Care – PPO | Admitting: Surgery

## 2021-10-18 ENCOUNTER — Other Ambulatory Visit: Payer: Self-pay | Admitting: Podiatry

## 2021-10-28 ENCOUNTER — Other Ambulatory Visit: Payer: Self-pay | Admitting: Podiatry

## 2021-11-01 ENCOUNTER — Other Ambulatory Visit: Payer: Self-pay

## 2021-11-01 ENCOUNTER — Ambulatory Visit (INDEPENDENT_AMBULATORY_CARE_PROVIDER_SITE_OTHER): Payer: Medicare Other | Admitting: Podiatry

## 2021-11-01 DIAGNOSIS — M79674 Pain in right toe(s): Secondary | ICD-10-CM | POA: Diagnosis not present

## 2021-11-01 DIAGNOSIS — M79675 Pain in left toe(s): Secondary | ICD-10-CM | POA: Diagnosis not present

## 2021-11-01 DIAGNOSIS — E1149 Type 2 diabetes mellitus with other diabetic neurological complication: Secondary | ICD-10-CM | POA: Diagnosis not present

## 2021-11-01 DIAGNOSIS — B351 Tinea unguium: Secondary | ICD-10-CM | POA: Diagnosis not present

## 2021-11-05 NOTE — Progress Notes (Signed)
Subjective: 66 year old female presents the office today for diabetic foot exam.  She states that the nails have been growing out and she thinks the nail started to come back on the left fifth toe.  Denies any open sores or any swelling.  She does still have neuropathy symptoms.  She has no new concerns otherwise.  Objective: AAO x3, NAD DP/PT pulses palpable bilaterally, CRT less than 3 seconds Sensation decreased within 20 monofilament Nails are hypertrophic, dystrophic, brittle, discolored, elongated 9. No surrounding redness or drainage. Tenderness nails 1-5 bilaterally except for the left fifth toenail but is starting to grow back. No open lesions or pre-ulcerative lesions are identified today. No pain with calf compression, swelling, warmth, erythema  Assessment: Symptomatic onychomycosis, type 2 diabetes with neuropathy  Plan: -All treatment options discussed with the patient including all alternatives, risks, complications.  -Sharply debrided nails x9 without any complications or bleeding. -Discussed treatment options for neuropathy.  She is previous not renal to tolerate gabapentin.  Discussed other options with orally as well as topically. -Patient encouraged to call the office with any questions, concerns, change in symptoms.   Trula Slade DPM

## 2021-11-12 ENCOUNTER — Encounter (INDEPENDENT_AMBULATORY_CARE_PROVIDER_SITE_OTHER): Payer: Medicare Other | Admitting: Ophthalmology

## 2021-11-12 ENCOUNTER — Other Ambulatory Visit: Payer: Self-pay

## 2021-11-12 DIAGNOSIS — H43813 Vitreous degeneration, bilateral: Secondary | ICD-10-CM

## 2021-11-12 DIAGNOSIS — E113313 Type 2 diabetes mellitus with moderate nonproliferative diabetic retinopathy with macular edema, bilateral: Secondary | ICD-10-CM

## 2021-11-27 ENCOUNTER — Other Ambulatory Visit: Payer: Self-pay | Admitting: Podiatry

## 2021-11-28 ENCOUNTER — Ambulatory Visit: Payer: Self-pay | Admitting: Surgery

## 2021-11-28 NOTE — H&P (Signed)
?[name] ?[id number]  ? ?Referring Provider:  [facility] ? ? ?Subjective  ? ?Chief Complaint: new problem ?  ? ? ?History of Present Illness: ?66 year old woman with multiple medical issues who is known to me following excisional lymph node biopsy a few years ago for lymphadenopathy which was ultimately found to be benign.  She has been suffering with diarrhea, fecal incontinence, and frequent UTIs and was evaluated by a urologist for the latter, underwent a CT urogram ?1 month ago, comparison was 11/02/2018.  Findings notable for ?-Cholecystectomy ?-Distal pancreatectomy/splenectomy ?-Hysterectomy ?Stable enlarged appendix measuring 12 mm without inflammatory changes ?I reviewed her previous CT from 2020 which notes a noninflamed appendix measuring 11 mm. ?Her last colonoscopy was with Dr. Oletta Lamas at [place] in 2016 and was negative. ? ?She notes an occasional twinge of pain in the right lower quadrant, but her major concern is her diarrhea and fecal incontinence. ? ? ?Review of Systems: ?A complete review of systems was obtained from the patient.  I have reviewed this information and discussed as appropriate with the patient.  See HPI as well for other ROS. ? ? ?Medical History: ?Reviewed ? ?Allergies  ?Allergen Reactions  ? Atorvastatin Other (See Comments)  ? Ciprofloxacin Other (See Comments)  ? Doxycycline Hyclate Nausea And Vomiting and Vomiting  ? Famotidine Other (See Comments)  ?  "Sick on stomach" ?HEADACHE ?  ? Gabapentin Other (See Comments)  ?  Caused upset stomach ?Caused upset stomach ?  ? Lisinopril Other (See Comments)  ? Penicillin Other (See Comments)  ? ? ?Current Outpatient Medications on File Prior to Visit  ?Medication Sig Dispense Refill  ? clonazePAM (KLONOPIN) 1 MG tablet TAKE 1 TABLET BY MOUTH ONCE A DAY AS NEEDED FOR 30 DAYS    ? FUROsemide (LASIX) 20 MG tablet TAKE 1 TABLET BY MOUTH DAILY AS NEEDED FOR FLUID.    ? HYDROcodone-acetaminophen (NORCO) 5-325 mg tablet 1-2 tablets  as needed    ? insulin ASPART (NOVOLOG FLEXPEN U-100 INSULIN) pen injector (concentration 100 units/mL) 2 to 4 units    ? insulin DEGLUDEC (TRESIBA FLEXTOUCH U-100) pen injector (concentration 100 units/mL) 13 units    ? mupirocin (BACTROBAN) 2 % ointment Apply topically 2 (two) times daily    ? pregabalin (LYRICA) 150 MG capsule 1 capsule    ? sodium zirconium cyclosilicate (LOKELMA) 10 gram packet 1 packet dissolved in water    ? traMADoL (ULTRAM) 50 mg tablet Take 1 tablet by mouth every 6 (six) hours as needed    ? acetaminophen (TYLENOL) 500 mg capsule 1 capsule as needed    ? amLODIPine (NORVASC) 5 MG tablet Take 5 mg by mouth once daily    ? apixaban (ELIQUIS) 5 mg tablet Take 1 tablet by mouth 2 (two) times daily    ? atorvastatin (LIPITOR) 10 MG tablet Take 1 tablet by mouth at bedtime    ? BD NANO 2ND GEN PEN NEEDLE 32 gauge x 5/32" Ndle     ? blood glucose diagnostic (ONETOUCH VERIO TEST STRIPS) test strip check blood sugar    ? cetirizine (ZYRTEC) 10 MG tablet Take 10 mg by mouth    ? cholecalciferol, vitamin D3, (VITAMIN D3) 125 mcg (5,000 unit) tablet Take by mouth    ? ciprofloxacin HCl (CIPRO) 250 MG tablet Take 250 mg by mouth 2 (two) times daily    ? diclofenac (VOLTAREN) 1 % topical gel     ? estradioL (ESTRACE) 0.01 % (0.1 mg/gram) vaginal cream     ?  FLUoxetine (PROZAC) 10 MG capsule     ? hydroCHLOROthiazide (HYDRODIURIL) 25 MG tablet     ? lancets (ONETOUCH DELICA LANCETS) check blood sugar    ? losartan (COZAAR) 50 MG tablet Take 50 mg by mouth 2 (two) times daily    ? metoprolol succinate (TOPROL-XL) 25 MG XL tablet     ? multivitamin with B complex-vitamin C (FARBEE WITH C) tablet Take 1 tablet by mouth once daily    ? nitrofurantoin, macrocrystal-monohydrate, (MACROBID) 100 MG capsule     ? omeprazole (PRILOSEC) 20 MG DR capsule TAKE ONE CAPSULE BY MOUTH EVERY DAY 30 MINUTES BEFORE MORNING MEAL FOR 90 DAYS    ? peg 400-propylene glycol (SYSTANE, PROPYLENE GLYCOL,) 0.4-0.3 % drops as  directed    ? propylene glycoL (SYSTANE BALANCE) 0.6 % ophthalmic drops Apply to eye    ? Saccharomyces boulardii (FLORASTOR) 250 mg capsule 1 capsule    ? triamcinolone (NASACORT) 55 mcg nasal spray 2 sprays in each nostril as needed for allergies    ? trimethoprim-polymyxin b (POLYTRIM) ophthalmic solution     ? ?No current facility-administered medications on file prior to visit.  ? ? ?Family History  ?Problem Relation Age of Onset  ? Obesity Mother   ? High blood pressure (Hypertension) Mother   ? Hyperlipidemia (Elevated cholesterol) Mother   ? Diabetes Mother   ? Breast cancer Mother   ? Coronary Artery Disease (Blocked arteries around heart) Father   ? Obesity Father   ? High blood pressure (Hypertension) Father   ? Hyperlipidemia (Elevated cholesterol) Father   ? Diabetes Father   ?  ? ?Social History  ? ?Tobacco Use  ?Smoking Status Former  ? Types: Cigarettes  ?Smokeless Tobacco Never  ?  ? ?Social History  ? ?Socioeconomic History  ? Marital status: Married  ?Tobacco Use  ? Smoking status: Former  ?  Types: Cigarettes  ? Smokeless tobacco: Never  ?Substance and Sexual Activity  ? Alcohol use: Never  ? Drug use: Never  ? ? ?Objective:  ? ? ?Vitals:  ? 11/28/21 1429  ?Pulse: 65  ?Temp: 36.1 ?C (97 ?F)  ?SpO2: 97%  ?Weight: 73.7 kg (162 lb 6.4 oz)  ?Height: 152.4 cm (5')  ?  ?Body mass index is 31.72 kg/m?. ? ?Alert and well-appearing ?Unlabored respirations ?Abdomen soft and nontender ? ?Assessment and Plan:  ?Diagnoses and all orders for this visit: ? ?Diarrhea, unspecified type ?-     Ambulatory Referral to Gastroenterology ? ?Irritable bowel syndrome with diarrhea ?-     Ambulatory Referral to Gastroenterology ? ?  ?We discussed her CT findings and that it is reassuring that the appendix is essentially stable in the 3-year interval since her last CT scan, but that appendectomy is a reasonable endeavor to exclude low-grade mucinous appendiceal neoplasm or other etiology of the dilated appendix.  We  discussed the procedure including the technique, risks of bleeding, infection, pain, scarring, injury to intra-abdominal structures, need to convert to open surgery or more extensive resection, staple line leak or abscess, as well as cardiovascular/thromboembolic/pulmonary complications.  Discussed that with her extensive abdominal surgical history she is likely to have a fair amount of adhesive disease that increases her risks of intraoperative complication. ? ?I made it very clear to her that appendectomy is not expected to have any effect on her diarrhea or fecal incontinence, and recommended that she follow-up with gastroenterology to have a thoughtful investigation of her GI symptoms and to initiate treatment.  We will plan to proceed with laparoscopic appendectomy at her convenience. ? ?Zohair Epp Raquel James, MD  ?.  ?

## 2021-11-28 NOTE — H&P (View-Only) (Signed)
?[name] ?[id number]  ? ?Referring Provider:  [facility] ? ? ?Subjective  ? ?Chief Complaint: new problem ?  ? ? ?History of Present Illness: ?66 year old woman with multiple medical issues who is known to me following excisional lymph node biopsy a few years ago for lymphadenopathy which was ultimately found to be benign.  She has been suffering with diarrhea, fecal incontinence, and frequent UTIs and was evaluated by a urologist for the latter, underwent a CT urogram ?1 month ago, comparison was 11/02/2018.  Findings notable for ?-Cholecystectomy ?-Distal pancreatectomy/splenectomy ?-Hysterectomy ?Stable enlarged appendix measuring 12 mm without inflammatory changes ?I reviewed her previous CT from 2020 which notes a noninflamed appendix measuring 11 mm. ?Her last colonoscopy was with Dr. Oletta Lamas at [place] in 2016 and was negative. ? ?She notes an occasional twinge of pain in the right lower quadrant, but her major concern is her diarrhea and fecal incontinence. ? ? ?Review of Systems: ?A complete review of systems was obtained from the patient.  I have reviewed this information and discussed as appropriate with the patient.  See HPI as well for other ROS. ? ? ?Medical History: ?Reviewed ? ?Allergies  ?Allergen Reactions  ? Atorvastatin Other (See Comments)  ? Ciprofloxacin Other (See Comments)  ? Doxycycline Hyclate Nausea And Vomiting and Vomiting  ? Famotidine Other (See Comments)  ?  "Sick on stomach" ?HEADACHE ?  ? Gabapentin Other (See Comments)  ?  Caused upset stomach ?Caused upset stomach ?  ? Lisinopril Other (See Comments)  ? Penicillin Other (See Comments)  ? ? ?Current Outpatient Medications on File Prior to Visit  ?Medication Sig Dispense Refill  ? clonazePAM (KLONOPIN) 1 MG tablet TAKE 1 TABLET BY MOUTH ONCE A DAY AS NEEDED FOR 30 DAYS    ? FUROsemide (LASIX) 20 MG tablet TAKE 1 TABLET BY MOUTH DAILY AS NEEDED FOR FLUID.    ? HYDROcodone-acetaminophen (NORCO) 5-325 mg tablet 1-2 tablets  as needed    ? insulin ASPART (NOVOLOG FLEXPEN U-100 INSULIN) pen injector (concentration 100 units/mL) 2 to 4 units    ? insulin DEGLUDEC (TRESIBA FLEXTOUCH U-100) pen injector (concentration 100 units/mL) 13 units    ? mupirocin (BACTROBAN) 2 % ointment Apply topically 2 (two) times daily    ? pregabalin (LYRICA) 150 MG capsule 1 capsule    ? sodium zirconium cyclosilicate (LOKELMA) 10 gram packet 1 packet dissolved in water    ? traMADoL (ULTRAM) 50 mg tablet Take 1 tablet by mouth every 6 (six) hours as needed    ? acetaminophen (TYLENOL) 500 mg capsule 1 capsule as needed    ? amLODIPine (NORVASC) 5 MG tablet Take 5 mg by mouth once daily    ? apixaban (ELIQUIS) 5 mg tablet Take 1 tablet by mouth 2 (two) times daily    ? atorvastatin (LIPITOR) 10 MG tablet Take 1 tablet by mouth at bedtime    ? BD NANO 2ND GEN PEN NEEDLE 32 gauge x 5/32" Ndle     ? blood glucose diagnostic (ONETOUCH VERIO TEST STRIPS) test strip check blood sugar    ? cetirizine (ZYRTEC) 10 MG tablet Take 10 mg by mouth    ? cholecalciferol, vitamin D3, (VITAMIN D3) 125 mcg (5,000 unit) tablet Take by mouth    ? ciprofloxacin HCl (CIPRO) 250 MG tablet Take 250 mg by mouth 2 (two) times daily    ? diclofenac (VOLTAREN) 1 % topical gel     ? estradioL (ESTRACE) 0.01 % (0.1 mg/gram) vaginal cream     ?  FLUoxetine (PROZAC) 10 MG capsule     ? hydroCHLOROthiazide (HYDRODIURIL) 25 MG tablet     ? lancets (ONETOUCH DELICA LANCETS) check blood sugar    ? losartan (COZAAR) 50 MG tablet Take 50 mg by mouth 2 (two) times daily    ? metoprolol succinate (TOPROL-XL) 25 MG XL tablet     ? multivitamin with B complex-vitamin C (FARBEE WITH C) tablet Take 1 tablet by mouth once daily    ? nitrofurantoin, macrocrystal-monohydrate, (MACROBID) 100 MG capsule     ? omeprazole (PRILOSEC) 20 MG DR capsule TAKE ONE CAPSULE BY MOUTH EVERY DAY 30 MINUTES BEFORE MORNING MEAL FOR 90 DAYS    ? peg 400-propylene glycol (SYSTANE, PROPYLENE GLYCOL,) 0.4-0.3 % drops as  directed    ? propylene glycoL (SYSTANE BALANCE) 0.6 % ophthalmic drops Apply to eye    ? Saccharomyces boulardii (FLORASTOR) 250 mg capsule 1 capsule    ? triamcinolone (NASACORT) 55 mcg nasal spray 2 sprays in each nostril as needed for allergies    ? trimethoprim-polymyxin b (POLYTRIM) ophthalmic solution     ? ?No current facility-administered medications on file prior to visit.  ? ? ?Family History  ?Problem Relation Age of Onset  ? Obesity Mother   ? High blood pressure (Hypertension) Mother   ? Hyperlipidemia (Elevated cholesterol) Mother   ? Diabetes Mother   ? Breast cancer Mother   ? Coronary Artery Disease (Blocked arteries around heart) Father   ? Obesity Father   ? High blood pressure (Hypertension) Father   ? Hyperlipidemia (Elevated cholesterol) Father   ? Diabetes Father   ?  ? ?Social History  ? ?Tobacco Use  ?Smoking Status Former  ? Types: Cigarettes  ?Smokeless Tobacco Never  ?  ? ?Social History  ? ?Socioeconomic History  ? Marital status: Married  ?Tobacco Use  ? Smoking status: Former  ?  Types: Cigarettes  ? Smokeless tobacco: Never  ?Substance and Sexual Activity  ? Alcohol use: Never  ? Drug use: Never  ? ? ?Objective:  ? ? ?Vitals:  ? 11/28/21 1429  ?Pulse: 65  ?Temp: 36.1 ?C (97 ?F)  ?SpO2: 97%  ?Weight: 73.7 kg (162 lb 6.4 oz)  ?Height: 152.4 cm (5')  ?  ?Body mass index is 31.72 kg/m?. ? ?Alert and well-appearing ?Unlabored respirations ?Abdomen soft and nontender ? ?Assessment and Plan:  ?Diagnoses and all orders for this visit: ? ?Diarrhea, unspecified type ?-     Ambulatory Referral to Gastroenterology ? ?Irritable bowel syndrome with diarrhea ?-     Ambulatory Referral to Gastroenterology ? ?  ?We discussed her CT findings and that it is reassuring that the appendix is essentially stable in the 3-year interval since her last CT scan, but that appendectomy is a reasonable endeavor to exclude low-grade mucinous appendiceal neoplasm or other etiology of the dilated appendix.  We  discussed the procedure including the technique, risks of bleeding, infection, pain, scarring, injury to intra-abdominal structures, need to convert to open surgery or more extensive resection, staple line leak or abscess, as well as cardiovascular/thromboembolic/pulmonary complications.  Discussed that with her extensive abdominal surgical history she is likely to have a fair amount of adhesive disease that increases her risks of intraoperative complication. ? ?I made it very clear to her that appendectomy is not expected to have any effect on her diarrhea or fecal incontinence, and recommended that she follow-up with gastroenterology to have a thoughtful investigation of her GI symptoms and to initiate treatment.  We will plan to proceed with laparoscopic appendectomy at her convenience. ? ?Karlin Heilman Raquel James, MD  ?.  ?

## 2021-12-10 ENCOUNTER — Encounter (HOSPITAL_COMMUNITY): Payer: Self-pay

## 2021-12-10 NOTE — Progress Notes (Addendum)
PCP - Ranee Gosselin, PA ?Cardiologist - Dr. Waunita Schooner  ? ?PPM/ICD -  ?Device Orders -  ?Rep Notified -  ? ?Chest x-ray -  ?EKG -  ?Stress Test - 2020 ?ECHO - 2020 ?Cardiac Cath -  ? ?Sleep Study -  ?CPAP -  ? ?Fasting Blood Sugar - 110's ?Checks Blood Sugar __Dexcom monitor___  ? ? ?Elequis- No instructions Spoke with Abigail Butts At [facility]  she is going to get in touch with cardiology. Meanwhile pt. Given anesthesia guidelines on stopping Elequis ?Aspirin Instructions: ? ?ERAS Protcol - ?PRE-SURGERY   ? ? ?COVID vaccine -X3 pfizer ? ?Activity--Able to complete ADL's without Sob using a treadmill at home  ?Anesthesia review: DM, CHF,  Afib, HTN ? ?Patient denies shortness of breath, fever, cough and chest pain at PAT appointment ? ? ?All instructions explained to the patient, with a verbal understanding of the material. Patient agrees to go over the instructions while at home for a better understanding. Patient also instructed to self quarantine after being tested for COVID-19. The opportunity to ask questions was provided. ?  ?

## 2021-12-13 ENCOUNTER — Encounter (HOSPITAL_COMMUNITY): Payer: Self-pay

## 2021-12-13 ENCOUNTER — Encounter (HOSPITAL_COMMUNITY)
Admission: RE | Admit: 2021-12-13 | Discharge: 2021-12-13 | Disposition: A | Discharge status: Home / Self Care | Payer: Medicare Other | Source: Ambulatory Visit | Attending: Surgery | Admitting: Surgery

## 2021-12-13 ENCOUNTER — Other Ambulatory Visit: Payer: Self-pay

## 2021-12-13 VITALS — BP 145/65 | HR 68 | Temp 98.8°F | Resp 16 | Ht 60.0 in

## 2021-12-13 DIAGNOSIS — K380 Hyperplasia of appendix: Secondary | ICD-10-CM | POA: Insufficient documentation

## 2021-12-13 DIAGNOSIS — I110 Hypertensive heart disease with heart failure: Secondary | ICD-10-CM | POA: Diagnosis not present

## 2021-12-13 DIAGNOSIS — K219 Gastro-esophageal reflux disease without esophagitis: Secondary | ICD-10-CM | POA: Insufficient documentation

## 2021-12-13 DIAGNOSIS — I4891 Unspecified atrial fibrillation: Secondary | ICD-10-CM | POA: Diagnosis not present

## 2021-12-13 DIAGNOSIS — I10 Essential (primary) hypertension: Secondary | ICD-10-CM

## 2021-12-13 DIAGNOSIS — Z01818 Encounter for other preprocedural examination: Secondary | ICD-10-CM | POA: Insufficient documentation

## 2021-12-13 DIAGNOSIS — E1165 Type 2 diabetes mellitus with hyperglycemia: Secondary | ICD-10-CM | POA: Insufficient documentation

## 2021-12-13 DIAGNOSIS — Z87891 Personal history of nicotine dependence: Secondary | ICD-10-CM | POA: Diagnosis not present

## 2021-12-13 DIAGNOSIS — I509 Heart failure, unspecified: Secondary | ICD-10-CM | POA: Diagnosis not present

## 2021-12-13 HISTORY — DX: Pneumonia, unspecified organism: J18.9

## 2021-12-13 HISTORY — DX: Type 2 diabetes mellitus without complications: E11.9

## 2021-12-13 HISTORY — DX: Anxiety disorder, unspecified: F41.9

## 2021-12-13 HISTORY — DX: Unspecified osteoarthritis, unspecified site: M19.90

## 2021-12-13 HISTORY — DX: Cardiac arrhythmia, unspecified: I49.9

## 2021-12-13 HISTORY — DX: Heart failure, unspecified: I50.9

## 2021-12-13 HISTORY — DX: Myoneural disorder, unspecified: G70.9

## 2021-12-13 LAB — HEMOGLOBIN A1C
Hgb A1c MFr Bld: 10.9 % — ABNORMAL HIGH (ref 4.8–5.6)
Mean Plasma Glucose: 266.13 mg/dL

## 2021-12-13 LAB — BASIC METABOLIC PANEL
Anion gap: 7 (ref 5–15)
BUN: 57 mg/dL — ABNORMAL HIGH (ref 8–23)
CO2: 27 mmol/L (ref 22–32)
Calcium: 9.1 mg/dL (ref 8.9–10.3)
Chloride: 101 mmol/L (ref 98–111)
Creatinine, Ser: 1.43 mg/dL — ABNORMAL HIGH (ref 0.44–1.00)
GFR, Estimated: 40 mL/min — ABNORMAL LOW (ref >60)
Glucose, Bld: 169 mg/dL — ABNORMAL HIGH (ref 70–99)
Potassium: 5.4 mmol/L — ABNORMAL HIGH (ref 3.5–5.1)
Sodium: 135 mmol/L (ref 135–145)

## 2021-12-13 LAB — GLUCOSE, CAPILLARY: Glucose-Capillary: 195 mg/dL — ABNORMAL HIGH (ref 70–99)

## 2021-12-13 LAB — CBC
HCT: 41.4 % (ref 36.0–46.0)
Hemoglobin: 13.2 g/dL (ref 12.0–15.0)
MCH: 25.6 pg — ABNORMAL LOW (ref 26.0–34.0)
MCHC: 31.9 g/dL (ref 30.0–36.0)
MCV: 80.2 fL (ref 80.0–100.0)
Platelets: 260 10*3/uL (ref 150–400)
RBC: 5.16 MIL/uL — ABNORMAL HIGH (ref 3.87–5.11)
RDW: 15.4 % (ref 11.5–15.5)
WBC: 12.5 10*3/uL — ABNORMAL HIGH (ref 4.0–10.5)
nRBC: 0 % (ref 0.0–0.2)

## 2021-12-13 NOTE — Patient Instructions (Addendum)
DUE TO COVID-19 ONLY TWO VISITOR  (aged 68 and older)  IS ALLOWED TO COME WITH YOU AND STAY IN THE WAITING ROOM ONLY DURING PRE OP AND PROCEDURE.   ?**NO VISITORS ARE ALLOWED IN THE SHORT STAY AREA OR RECOVERY ROOM!!** ? ?IF YOU WILL BE ADMITTED INTO THE HOSPITAL YOU ARE ALLOWED ONLY FOUR  SUPPORT PEOPLE DURING VISITATION HOURS ONLY (7 AM -8PM)   ?The support person(s) must pass our screening, gel in and out, and wear a mask at all times, including in the patient?s room. ?Patients must also wear a mask when staff or their support person are in the room. ?Visitors GUEST BADGE MUST BE WORN VISIBLY  ?One adult visitor may remain with you overnight and MUST be in the room by 8 P.M. ?  ? ? Your procedure is scheduled on: 12-17-21  ? ? Report to [facility] Main Entrance ? ?  Report to admitting at     [place]  AM ? ? Call this number if you have problems the morning of surgery [phone] ? ? Do not eat food :After Midnight. ? ? After Midnight you may have the following liquids until ___0900___ AM/ DAY OF SURGERY ? ?Water ?Black Coffee (sugar ok, NO MILK/CREAM OR CREAMERS)  ?Tea (sugar ok, NO MILK/CREAM OR CREAMERS) regular and decaf                             ?Plain Jell-O (NO RED)                                           ?Fruit ices (not with fruit pulp, NO RED)                                     ?Popsicles (NO RED)                                                                  ?Juice: apple, WHITE grape, WHITE cranberry ?Sports drinks like Gatorade (NO RED) ?Clear broth(vegetable,chicken,beef) ? ?             ? ?FOLLOW BOWEL PREP AND ANY ADDITIONAL PRE OP INSTRUCTIONS YOU RECEIVED FROM YOUR SURGEON'S OFFICE!!! ?  ?  ?Oral Hygiene is also important to reduce your risk of infection.                                    ?Remember - BRUSH YOUR TEETH THE MORNING OF SURGERY WITH YOUR REGULAR TOOTHPASTE ? ? Do NOT smoke after Midnight ? ? Take these medicines the morning of surgery with A SIP OF WATER: eye drops,  omeprazole, metoprolol, Fluoxetine, clonazepam,amlodipine ? ?DO NOT TAKE ANY ORAL DIABETIC MEDICATIONS DAY OF YOUR SURGERY ? ?These are anesthesia recommendations for holding your anticoagulants.  Please contact your prescribing physician to confirm IF it is safe to hold your anticoagulants for this length of time. ? ? ?Eliquis Apixaban   72 hours   ?  Xarelto Rivaroxaban   72 hours  ?Plavix Clopidogrel   120 hours  ?Pletal Cilostazol   120 hours  ?  ?                  ?           You may not have any metal on your body including hair pins, jewelry, and body piercing ? ?           Do not wear make-up, lotions, powders, perfumes/cologne, or deodorant ? ?Do not wear nail polish including gel and S&S, artificial/acrylic nails, or any other type of covering on natural nails including finger and toenails. If you have artificial nails, gel coating, etc. that needs to be removed by a nail salon please have this removed prior to surgery or surgery may need to be canceled/ delayed if the surgeon/ anesthesia feels like they are unable to be safely monitored.  ? ?Do not shave  48 hours prior to surgery.  ? ?         ? ? Do not bring valuables to the hospital. [facility] NOT ?            RESPONSIBLE   FOR VALUABLES. ? ? Contacts, dentures or bridgework may not be worn into surgery. ? ? Bring small overnight bag day of surgery. ?  ? Patients discharged on the day of surgery will not be allowed to drive home.  Someone NEEDS to stay with you for the first 24 hours after anesthesia. ? ? Special Instructions: Bring a copy of your healthcare power of attorney and living will documents         the day of surgery if you haven't scanned them before. ? ?            Please read over the following fact sheets you were given: [facility] [phone] ? ?   [facility] - Preparing for Surgery ?Before surgery, you can play an important role.  Because skin is not sterile, your skin needs  to be as free of germs as possible.  You can reduce the number of germs on your skin by washing with CHG (chlorahexidine gluconate) soap before surgery.  CHG is an antiseptic cleaner which kills germs and bonds with the skin to continue killing germs even after washing. ?Please DO NOT use if you have an allergy to CHG or antibacterial soaps.  If your skin becomes reddened/irritated stop using the CHG and inform your nurse when you arrive at Short Stay. ?Do not shave (including legs and underarms) for at least 48 hours prior to the first CHG shower.  You may shave your face/neck. ?Please follow these instructions carefully: ? 1.  Shower with CHG Soap the night before surgery and the  morning of Surgery. ? 2.  If you choose to wash your hair, wash your hair first as usual with your  normal  shampoo. ? 3.  After you shampoo, rinse your hair and body thoroughly to remove the  shampoo.                           4.  Use CHG as you would any other liquid soap.  You can apply chg directly  to the skin and wash  ?                     Gently  with a scrungie or clean washcloth. ? 5.  Apply the CHG Soap to your body ONLY FROM THE NECK DOWN.   Do not use on face/ open      ?                     Wound or open sores. Avoid contact with eyes, ears mouth and genitals (private parts).  ?                     Production manager,  Genitals (private parts) with your normal soap. ?            6.  Wash thoroughly, paying special attention to the area where your surgery  will be performed. ? 7.  Thoroughly rinse your body with warm water from the neck down. ? 8.  DO NOT shower/wash with your normal soap after using and rinsing off  the CHG Soap. ?               9.  Pat yourself dry with a clean towel. ?           10.  Wear clean pajamas. ?           11.  Place clean sheets on your bed the night of your first shower and do not  sleep with pets. ?Day of Surgery : ?Do not apply any lotions/deodorants the morning of surgery.  Please wear clean clothes  to the hospital/surgery center. ? ?FAILURE TO FOLLOW THESE INSTRUCTIONS MAY RESULT IN THE CANCELLATION OF YOUR SURGERY ?PATIENT SIGNATURE_________________________________ ? ?NURSE SIGNATURE__________________________________ ? ?________________________________________________________________________ How to Manage Your Diabetes ?Before and After Surgery ? ?Why is it important to control my blood sugar before and after surgery? ?Improving blood sugar levels before and after surgery helps healing and can limit problems. ?A way of improving blood sugar control is eating a healthy diet by: ? Eating less sugar and carbohydrates ? Increasing activity/exercise ? Talking with your doctor about reaching your blood sugar goals ?High blood sugars (greater than 180 mg/dL) can raise your risk of infections and slow your recovery, so you will need to focus on controlling your diabetes during the weeks before surgery. ?Make sure that the doctor who takes care of your diabetes knows about your planned surgery including the date and location. ? ?How do I manage my blood sugar before surgery? ?Check your blood sugar at least 4 times a day, starting 2 days before surgery, to make sure that the level is not too high or low. ?Check your blood sugar the morning of your surgery when you wake up and every 2 hours until you get to the Short Stay unit. ?If your blood sugar is less than 70 mg/dL, you will need to treat for low blood sugar: ?Do not take insulin. ?Treat a low blood sugar (less than 70 mg/dL) with ? cup of clear juice (cranberry or apple), 4 glucose tablets, OR glucose gel. ?Recheck blood sugar in 15 minutes after treatment (to make sure it is greater than 70 mg/dL). If your blood sugar is not greater than 70 mg/dL on recheck, call [phone] for further instructions. ?Report your blood sugar to the short stay nurse when you get to Short Stay. ? ?If you are admitted to the hospital after surgery: ?Your blood sugar will be  checked by the staff and you will probably be given insulin after surgery (instead of oral diabetes medicines) to make sure you have good blood  sugar levels. ?The goal for blood sugar control after surgery is 8

## 2021-12-14 NOTE — Progress Notes (Signed)
Hgba1c done 12/13/21 routed to DR Kae Heller on 12/14/21.  ?

## 2021-12-14 NOTE — Progress Notes (Signed)
Anesthesia Chart Review ? ? Case: [id number] Date/Time: 12/17/21 1145  ? Procedure: LAPAROSCOPIC APPENDECTOMY  ? Anesthesia type: General  ? Pre-op diagnosis: ENLARGED APPENDIX  ? Location: [facility] ROOM 02 / [facility]  ? Surgeons: Clovis Riley, MD  ? ?  ? ? ?DISCUSSION:66 y.o. former smoker with h/o GERD, HTN, DM II, a-fib, enlarged appendix scheduled for above procedure 12/17/2021 with Dr. Romana Juniper.  ? ?A1C 10.9, forwarded to surgeon.   ? ?Spoke with triage nurse at [facility] regarding A1C and Eliquis, no instructions on when to hold Eliquis at PAT visit.  ?VS: BP (!) 145/65   Pulse 68   Temp 37.1 ?C (Oral)   Resp 16   Ht 5' (1.524 m)   SpO2 98%   BMI 30.51 kg/m?  ? ?PROVIDERS: ?[place], Delrae Rend, PA is PCP  ? ? ?LABS: Labs reviewed: Acceptable for surgery. ?(all labs ordered are listed, but only abnormal results are displayed) ? ?Labs Reviewed  ?HEMOGLOBIN A1C - Abnormal; Notable for the following components:  ?    Result Value  ? Hgb A1c MFr Bld 10.9 (*)   ? All other components within normal limits  ?BASIC METABOLIC PANEL - Abnormal; Notable for the following components:  ? Potassium 5.4 (*)   ? Glucose, Bld 169 (*)   ? BUN 57 (*)   ? Creatinine, Ser 1.43 (*)   ? GFR, Estimated 40 (*)   ? All other components within normal limits  ?CBC - Abnormal; Notable for the following components:  ? WBC 12.5 (*)   ? RBC 5.16 (*)   ? MCH 25.6 (*)   ? All other components within normal limits  ?GLUCOSE, CAPILLARY - Abnormal; Notable for the following components:  ? Glucose-Capillary 195 (*)   ? All other components within normal limits  ? ? ? ?IMAGES: ? ? ?EKG: ?12/13/21 ?Rate 65 bpm  ?NSR ? ?CV: ?Myocardial Perfusion 05/17/2019 ?IMPRESSION: ?1. No inducible ischemia identified. Faintly reduced matched ?anterior wall activity on stress and rest images could be from ?breast attenuation or mild scarring. ?  ?2. Normal left ventricular wall motion. ?  ?3. Left ventricular ejection fraction 68% ?  ?4. Non invasive risk  stratification*: Low ?  ?Echo 05/16/2019 ?1. The left ventricle has normal systolic function with an ejection  ?fraction of 60-65%. The cavity size was normal. There is mild concentric  ?left ventricular hypertrophy. Left ventricular diastolic Doppler  ?parameters are consistent with impaired  ?relaxation.  ? 2. The right ventricle has normal systolic function. The cavity was  ?normal. There is no increase in right ventricular wall thickness. Right  ?ventricular systolic pressure is normal.  ? 3. The mitral valve is degenerative. Mild thickening of the mitral valve  ?leaflet. Mild calcification of the mitral valve leaflet. There is mild  ?mitral annular calcification present.  ? 4. The aortic valve is tricuspid. Mild thickening of the aortic valve.  ?Mild calcification of the aortic valve. Aortic valve regurgitation was not  ?assessed by color flow Doppler.  ? 5. There is evidence of moderate plaque in the ascending aorta and aortic  ?root.  ?Past Medical History:  ?Diagnosis Date  ? Anxiety   ? Arthritis   ? 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 CERVICAL  ? CHF (congestive heart failure) ([place])   ? pt. denies  ? Diabetes mellitus without complication ([place])   ? type 2  ? Dysrhythmia   ? Afib  ? GERD (gastroesophageal reflux disease) 11/14/2013  ? Gout   ?  Hypertension   ? Mixed hyperlipidemia 11/14/2013  ? Neuromuscular disorder ([place])   ? neuropathy feet  ? Pneumonia   ? ? ?Past Surgical History:  ?Procedure Laterality Date  ? ABDOMINAL HYSTERECTOMY  1977  ? CERVICAL CA  ? CHOLECYSTECTOMY    ? EYE SURGERY    ? 6/3 LASER FOR GLAUCOMA, 9/03 CE/IOL IMPLANTS  ? INGUINAL LYMPH NODE BIOPSY Right 06/08/2018  ? Procedure: RIGHT INGUINAL LYMPH NODE EXCISIONAL BIOPSY;  Surgeon: Clovis Riley, MD;  Location: [facility];  Service: General;  Laterality: Right;  ? pancreatectomy    ? SHOULDER CLOSED REDUCTION Left 02/03/2013  ? Procedure: CLOSED MANIPULATION SHOULDER;  Surgeon: Vickey Huger, MD;  Location: [facility];  Service: Orthopedics;  Laterality:  Left;  CLOSED MANIPULATION LEFT SHOULDER  ? SPLENECTOMY    ? TONSILLECTOMY  1970  ? ? ?MEDICATIONS: ? amLODipine (NORVASC) 5 MG tablet  ? apixaban (ELIQUIS) 5 MG TABS tablet  ? BD PEN NEEDLE NANO 2ND GEN 32G X 4 MM MISC  ? Blood Glucose Monitoring Suppl (ONETOUCH VERIO) w/Device KIT  ? cetirizine (ZYRTEC) 10 MG tablet  ? Cholecalciferol (VITAMIN D-3) 5000 units TABS  ? clonazePAM (KLONOPIN) 1 MG tablet  ? diclofenac Sodium (VOLTAREN) 1 % GEL  ? estradiol (ESTRACE) 0.1 MG/GM vaginal cream  ? FLUoxetine (PROZAC) 20 MG capsule  ? glucose blood (ONETOUCH VERIO) test strip  ? hydrochlorothiazide (HYDRODIURIL) 25 MG tablet  ? insulin aspart (NOVOLOG FLEXPEN) 100 UNIT/ML FlexPen  ? insulin degludec (TRESIBA FLEXTOUCH) 100 UNIT/ML FlexTouch Pen  ? Loperamide-Simethicone 2-125 MG TABS  ? losartan (COZAAR) 50 MG tablet  ? metoprolol succinate (TOPROL-XL) 25 MG 24 hr tablet  ? mupirocin ointment (BACTROBAN) 2 %  ? omeprazole (PRILOSEC) 20 MG capsule  ? ONETOUCH DELICA LANCETS 16X MISC  ? Polyethyl Glycol-Propyl Glycol (SYSTANE) 0.4-0.3 % SOLN  ? pregabalin (LYRICA) 150 MG capsule  ? traMADol (ULTRAM) 50 MG tablet  ? triamcinolone (NASACORT) 55 MCG/ACT AERO nasal inhaler  ? ?No current facility-administered medications for this encounter.  ? ?Konrad Felix Ward, PA-C ?[facility] ?[phone] ? ? ? ? ? ? ? ?

## 2021-12-17 ENCOUNTER — Ambulatory Visit (HOSPITAL_COMMUNITY)
Admission: RE | Admit: 2021-12-17 | Discharge: 2021-12-17 | Disposition: A | Discharge status: Home / Self Care | Payer: Medicare Other | Attending: Surgery | Admitting: Surgery

## 2021-12-17 ENCOUNTER — Encounter (HOSPITAL_COMMUNITY)
Admission: RE | Disposition: A | Discharge status: Home / Self Care | Payer: Self-pay | Source: Home / Self Care | Attending: Surgery

## 2021-12-17 ENCOUNTER — Ambulatory Visit (HOSPITAL_COMMUNITY): Payer: Medicare Other | Admitting: Physician Assistant

## 2021-12-17 ENCOUNTER — Encounter (HOSPITAL_COMMUNITY): Payer: Self-pay | Admitting: Surgery

## 2021-12-17 ENCOUNTER — Ambulatory Visit (HOSPITAL_BASED_OUTPATIENT_CLINIC_OR_DEPARTMENT_OTHER): Payer: Medicare Other | Admitting: Certified Registered"

## 2021-12-17 ENCOUNTER — Other Ambulatory Visit: Payer: Self-pay

## 2021-12-17 DIAGNOSIS — K388 Other specified diseases of appendix: Secondary | ICD-10-CM

## 2021-12-17 DIAGNOSIS — E1142 Type 2 diabetes mellitus with diabetic polyneuropathy: Secondary | ICD-10-CM | POA: Diagnosis not present

## 2021-12-17 DIAGNOSIS — K580 Irritable bowel syndrome with diarrhea: Secondary | ICD-10-CM | POA: Insufficient documentation

## 2021-12-17 DIAGNOSIS — K660 Peritoneal adhesions (postprocedural) (postinfection): Secondary | ICD-10-CM

## 2021-12-17 DIAGNOSIS — R159 Full incontinence of feces: Secondary | ICD-10-CM | POA: Diagnosis not present

## 2021-12-17 DIAGNOSIS — K380 Hyperplasia of appendix: Secondary | ICD-10-CM | POA: Insufficient documentation

## 2021-12-17 DIAGNOSIS — Z87440 Personal history of urinary (tract) infections: Secondary | ICD-10-CM | POA: Insufficient documentation

## 2021-12-17 DIAGNOSIS — Z90411 Acquired partial absence of pancreas: Secondary | ICD-10-CM | POA: Insufficient documentation

## 2021-12-17 DIAGNOSIS — I509 Heart failure, unspecified: Secondary | ICD-10-CM | POA: Diagnosis not present

## 2021-12-17 DIAGNOSIS — F32A Depression, unspecified: Secondary | ICD-10-CM | POA: Insufficient documentation

## 2021-12-17 DIAGNOSIS — K219 Gastro-esophageal reflux disease without esophagitis: Secondary | ICD-10-CM | POA: Diagnosis not present

## 2021-12-17 DIAGNOSIS — Z9081 Acquired absence of spleen: Secondary | ICD-10-CM | POA: Insufficient documentation

## 2021-12-17 DIAGNOSIS — F419 Anxiety disorder, unspecified: Secondary | ICD-10-CM | POA: Insufficient documentation

## 2021-12-17 DIAGNOSIS — I110 Hypertensive heart disease with heart failure: Secondary | ICD-10-CM

## 2021-12-17 DIAGNOSIS — Z794 Long term (current) use of insulin: Secondary | ICD-10-CM | POA: Insufficient documentation

## 2021-12-17 DIAGNOSIS — E1165 Type 2 diabetes mellitus with hyperglycemia: Secondary | ICD-10-CM | POA: Diagnosis not present

## 2021-12-17 DIAGNOSIS — E1151 Type 2 diabetes mellitus with diabetic peripheral angiopathy without gangrene: Secondary | ICD-10-CM | POA: Diagnosis not present

## 2021-12-17 DIAGNOSIS — M1990 Unspecified osteoarthritis, unspecified site: Secondary | ICD-10-CM | POA: Diagnosis not present

## 2021-12-17 HISTORY — PX: LAPAROSCOPIC APPENDECTOMY: SHX408

## 2021-12-17 LAB — GLUCOSE, CAPILLARY
Glucose-Capillary: 173 mg/dL — ABNORMAL HIGH (ref 70–99)
Glucose-Capillary: 216 mg/dL — ABNORMAL HIGH (ref 70–99)

## 2021-12-17 SURGERY — APPENDECTOMY, LAPAROSCOPIC
Anesthesia: General | Site: Abdomen

## 2021-12-17 MED ORDER — FENTANYL CITRATE (PF) 100 MCG/2ML IJ SOLN
INTRAMUSCULAR | Status: AC
  Filled 2021-12-17: qty 2

## 2021-12-17 MED ORDER — FENTANYL CITRATE PF 50 MCG/ML IJ SOSY
PREFILLED_SYRINGE | INTRAMUSCULAR | Status: AC
  Filled 2021-12-17: qty 1

## 2021-12-17 MED ORDER — SODIUM CHLORIDE 0.9 % IV SOLN: 250.0000 mL | INTRAVENOUS | Status: DC | PRN

## 2021-12-17 MED ORDER — ONDANSETRON HCL 4 MG PO TABS: 4.0000 mg | ORAL_TABLET | Freq: Three times a day (TID) | ORAL | 0 refills | Status: DC | PRN

## 2021-12-17 MED ORDER — BUPIVACAINE-EPINEPHRINE 0.25% -1:200000 IJ SOLN
INTRAMUSCULAR | Status: DC | PRN
  Administered 2021-12-17: 30 mL

## 2021-12-17 MED ORDER — MIDAZOLAM HCL 2 MG/2ML IJ SOLN
INTRAMUSCULAR | Status: DC | PRN
  Administered 2021-12-17 (×2): 1 mg via INTRAVENOUS

## 2021-12-17 MED ORDER — PROPOFOL 10 MG/ML IV BOLUS
INTRAVENOUS | Status: AC
  Filled 2021-12-17: qty 20

## 2021-12-17 MED ORDER — CEFAZOLIN SODIUM-DEXTROSE 2-4 GM/100ML-% IV SOLN
2.0000 g | INTRAVENOUS | Status: AC
  Administered 2021-12-17: 2 g via INTRAVENOUS
  Filled 2021-12-17: qty 100

## 2021-12-17 MED ORDER — AMISULPRIDE (ANTIEMETIC) 5 MG/2ML IV SOLN: 10.0000 mg | Freq: Once | INTRAVENOUS | Status: DC | PRN

## 2021-12-17 MED ORDER — CHLORHEXIDINE GLUCONATE 0.12 % MT SOLN
15.0000 mL | Freq: Once | OROMUCOSAL | Status: AC
  Administered 2021-12-17: 15 mL via OROMUCOSAL

## 2021-12-17 MED ORDER — ORAL CARE MOUTH RINSE: 15.0000 mL | Freq: Once | OROMUCOSAL | Status: AC

## 2021-12-17 MED ORDER — DEXAMETHASONE SODIUM PHOSPHATE 10 MG/ML IJ SOLN
INTRAMUSCULAR | Status: AC
  Filled 2021-12-17: qty 1

## 2021-12-17 MED ORDER — LACTATED RINGERS IR SOLN
Status: DC | PRN
  Administered 2021-12-17: 1000 mL

## 2021-12-17 MED ORDER — DOCUSATE SODIUM 100 MG PO CAPS: 100.0000 mg | ORAL_CAPSULE | Freq: Two times a day (BID) | ORAL | 0 refills | Status: AC

## 2021-12-17 MED ORDER — SODIUM CHLORIDE 0.9% FLUSH: 3.0000 mL | Freq: Two times a day (BID) | INTRAVENOUS | Status: DC

## 2021-12-17 MED ORDER — ONDANSETRON HCL 4 MG/2ML IJ SOLN: 4.0000 mg | Freq: Once | INTRAMUSCULAR | Status: DC | PRN

## 2021-12-17 MED ORDER — ROCURONIUM BROMIDE 10 MG/ML (PF) SYRINGE
PREFILLED_SYRINGE | INTRAVENOUS | Status: DC | PRN
  Administered 2021-12-17: 80 mg via INTRAVENOUS

## 2021-12-17 MED ORDER — HYDROCODONE-ACETAMINOPHEN 5-325 MG PO TABS: 1.0000 | ORAL_TABLET | Freq: Four times a day (QID) | ORAL | 0 refills | Status: AC | PRN

## 2021-12-17 MED ORDER — BUPIVACAINE LIPOSOME 1.3 % IJ SUSP: 20.0000 mL | Freq: Once | INTRAMUSCULAR | Status: DC

## 2021-12-17 MED ORDER — BUPIVACAINE-EPINEPHRINE (PF) 0.25% -1:200000 IJ SOLN
INTRAMUSCULAR | Status: AC
  Filled 2021-12-17: qty 30

## 2021-12-17 MED ORDER — ONDANSETRON HCL 4 MG/2ML IJ SOLN
INTRAMUSCULAR | Status: DC | PRN
  Administered 2021-12-17: 4 mg via INTRAVENOUS

## 2021-12-17 MED ORDER — ACETAMINOPHEN 325 MG PO TABS: 650.0000 mg | ORAL_TABLET | ORAL | Status: DC | PRN

## 2021-12-17 MED ORDER — MIDAZOLAM HCL 2 MG/2ML IJ SOLN
INTRAMUSCULAR | Status: AC
  Filled 2021-12-17: qty 2

## 2021-12-17 MED ORDER — BUPIVACAINE-EPINEPHRINE 0.25% -1:200000 IJ SOLN
INTRAMUSCULAR | Status: AC
  Filled 2021-12-17: qty 1

## 2021-12-17 MED ORDER — INSULIN ASPART 100 UNIT/ML IJ SOLN: 1.0000 [IU] | Freq: Once | INTRAMUSCULAR | Status: AC

## 2021-12-17 MED ORDER — ACETAMINOPHEN 650 MG RE SUPP
650.0000 mg | RECTAL | Status: DC | PRN
  Filled 2021-12-17: qty 1

## 2021-12-17 MED ORDER — EPHEDRINE SULFATE-NACL 50-0.9 MG/10ML-% IV SOSY
PREFILLED_SYRINGE | INTRAVENOUS | Status: DC | PRN
Start: 2021-12-17 — End: 2021-12-17
  Administered 2021-12-17 (×2): 5 mg via INTRAVENOUS

## 2021-12-17 MED ORDER — FENTANYL CITRATE PF 50 MCG/ML IJ SOSY
25.0000 ug | PREFILLED_SYRINGE | INTRAMUSCULAR | Status: DC | PRN
  Administered 2021-12-17 (×3): 50 ug via INTRAVENOUS

## 2021-12-17 MED ORDER — SODIUM CHLORIDE 0.9% FLUSH: 3.0000 mL | INTRAVENOUS | Status: DC | PRN

## 2021-12-17 MED ORDER — FENTANYL CITRATE PF 50 MCG/ML IJ SOSY: 25.0000 ug | PREFILLED_SYRINGE | INTRAMUSCULAR | Status: DC | PRN

## 2021-12-17 MED ORDER — DEXMEDETOMIDINE (PRECEDEX) IN NS 20 MCG/5ML (4 MCG/ML) IV SYRINGE
PREFILLED_SYRINGE | INTRAVENOUS | Status: DC | PRN
  Administered 2021-12-17: 4 ug via INTRAVENOUS
  Administered 2021-12-17: 8 ug via INTRAVENOUS

## 2021-12-17 MED ORDER — ONDANSETRON HCL 4 MG/2ML IJ SOLN
INTRAMUSCULAR | Status: AC
  Filled 2021-12-17: qty 2

## 2021-12-17 MED ORDER — LACTATED RINGERS IV SOLN: INTRAVENOUS | Status: DC

## 2021-12-17 MED ORDER — INSULIN ASPART 100 UNIT/ML IJ SOLN
INTRAMUSCULAR | Status: AC
  Administered 2021-12-17: 1 [IU] via SUBCUTANEOUS
  Filled 2021-12-17: qty 1

## 2021-12-17 MED ORDER — CHLORHEXIDINE GLUCONATE 4 % EX LIQD: 60.0000 mL | Freq: Once | CUTANEOUS | Status: DC

## 2021-12-17 MED ORDER — FENTANYL CITRATE (PF) 250 MCG/5ML IJ SOLN
INTRAMUSCULAR | Status: DC | PRN
  Administered 2021-12-17: 50 ug via INTRAVENOUS
  Administered 2021-12-17: 25 ug via INTRAVENOUS
  Administered 2021-12-17: 50 ug via INTRAVENOUS
  Administered 2021-12-17: 25 ug via INTRAVENOUS
  Administered 2021-12-17: 50 ug via INTRAVENOUS

## 2021-12-17 MED ORDER — DEXAMETHASONE SODIUM PHOSPHATE 10 MG/ML IJ SOLN
INTRAMUSCULAR | Status: DC | PRN
  Administered 2021-12-17: 4 mg via INTRAVENOUS

## 2021-12-17 MED ORDER — 0.9 % SODIUM CHLORIDE (POUR BTL) OPTIME
TOPICAL | Status: DC | PRN
  Administered 2021-12-17: 1000 mL

## 2021-12-17 MED ORDER — PROPOFOL 10 MG/ML IV BOLUS
INTRAVENOUS | Status: DC | PRN
  Administered 2021-12-17: 30 mg via INTRAVENOUS
  Administered 2021-12-17: 120 mg via INTRAVENOUS

## 2021-12-17 MED ORDER — DEXMEDETOMIDINE (PRECEDEX) IN NS 20 MCG/5ML (4 MCG/ML) IV SYRINGE
PREFILLED_SYRINGE | INTRAVENOUS | Status: AC
  Filled 2021-12-17: qty 5

## 2021-12-17 MED ORDER — OXYCODONE HCL 5 MG PO TABS: 5.0000 mg | ORAL_TABLET | ORAL | Status: DC | PRN

## 2021-12-17 MED ORDER — FENTANYL CITRATE PF 50 MCG/ML IJ SOSY
PREFILLED_SYRINGE | INTRAMUSCULAR | Status: AC
  Filled 2021-12-17: qty 2

## 2021-12-17 MED ORDER — SUGAMMADEX SODIUM 200 MG/2ML IV SOLN
INTRAVENOUS | Status: DC | PRN
  Administered 2021-12-17: 150 mg via INTRAVENOUS

## 2021-12-17 MED ORDER — ROCURONIUM BROMIDE 10 MG/ML (PF) SYRINGE
PREFILLED_SYRINGE | INTRAVENOUS | Status: AC
  Filled 2021-12-17: qty 10

## 2021-12-17 MED ORDER — ACETAMINOPHEN 500 MG PO TABS
1000.0000 mg | ORAL_TABLET | ORAL | Status: AC
  Administered 2021-12-17: 1000 mg via ORAL
  Filled 2021-12-17: qty 2

## 2021-12-17 SURGICAL SUPPLY — 58 items
ADH SKN CLS APL DERMABOND .7 (GAUZE/BANDAGES/DRESSINGS) ×1
APL PRP STRL LF DISP 70% ISPRP (MISCELLANEOUS) ×1
APPLIER CLIP 5 13 M/L LIGAMAX5 (MISCELLANEOUS)
APPLIER CLIP ROT 10 11.4 M/L (STAPLE)
APR CLP MED LRG 11.4X10 (STAPLE)
APR CLP MED LRG 5 ANG JAW (MISCELLANEOUS)
BAG COUNTER SPONGE SURGICOUNT (BAG) IMPLANT
BAG RETRIEVAL 10 (BASKET) ×1
BAG SPNG CNTER NS LX DISP (BAG)
CABLE HIGH FREQUENCY MONO STRZ (ELECTRODE) IMPLANT
CHLORAPREP W/TINT 26 (MISCELLANEOUS) ×3 IMPLANT
CLIP APPLIE 5 13 M/L LIGAMAX5 (MISCELLANEOUS) IMPLANT
CLIP APPLIE ROT 10 11.4 M/L (STAPLE) IMPLANT
COVER SURGICAL LIGHT HANDLE (MISCELLANEOUS) ×3 IMPLANT
CUTTER FLEX LINEAR 45M (STAPLE) ×1 IMPLANT
DERMABOND ADVANCED (GAUZE/BANDAGES/DRESSINGS) ×1
DERMABOND ADVANCED .7 DNX12 (GAUZE/BANDAGES/DRESSINGS) ×2 IMPLANT
DRAIN CHANNEL 19F RND (DRAIN) IMPLANT
ELECT REM PT RETURN 15FT ADLT (MISCELLANEOUS) ×3 IMPLANT
ENDOLOOP SUT PDS II  0 18 (SUTURE)
ENDOLOOP SUT PDS II 0 18 (SUTURE) IMPLANT
EVACUATOR SILICONE 100CC (DRAIN) IMPLANT
GLOVE SURG ENC MOIS LTX SZ6 (GLOVE) ×3 IMPLANT
GLOVE SURG MICRO LTX SZ6 (GLOVE) ×3 IMPLANT
GLOVE SURG UNDER LTX SZ6.5 (GLOVE) ×3 IMPLANT
GOWN STRL REUS W/ TWL LRG LVL3 (GOWN DISPOSABLE) ×2 IMPLANT
GOWN STRL REUS W/ TWL XL LVL3 (GOWN DISPOSABLE) IMPLANT
GOWN STRL REUS W/TWL LRG LVL3 (GOWN DISPOSABLE) ×4
GOWN STRL REUS W/TWL XL LVL3 (GOWN DISPOSABLE)
GRASPER SUT TROCAR 14GX15 (MISCELLANEOUS) IMPLANT
IRRIG SUCT STRYKERFLOW 2 WTIP (MISCELLANEOUS) ×2
IRRIGATION SUCT STRKRFLW 2 WTP (MISCELLANEOUS) ×2 IMPLANT
IV LACTATED RINGERS 1000ML (IV SOLUTION) ×1 IMPLANT
KIT BASIN OR (CUSTOM PROCEDURE TRAY) ×3 IMPLANT
KIT TURNOVER KIT A (KITS) IMPLANT
NDL INSUFFLATION 14GA 120MM (NEEDLE) ×2 IMPLANT
NEEDLE INSUFFLATION 14GA 120MM (NEEDLE) ×2 IMPLANT
NS IRRIG 1000ML POUR BTL (IV SOLUTION) ×1 IMPLANT
RELOAD 45 VASCULAR/THIN (ENDOMECHANICALS) IMPLANT
RELOAD STAPLE 45 2.5 WHT GRN (ENDOMECHANICALS) IMPLANT
RELOAD STAPLE 45 3.5 BLU ETS (ENDOMECHANICALS) IMPLANT
RELOAD STAPLE TA45 3.5 REG BLU (ENDOMECHANICALS) ×2 IMPLANT
SCISSORS LAP 5X35 DISP (ENDOMECHANICALS) IMPLANT
SET TUBE SMOKE EVAC HIGH FLOW (TUBING) ×3 IMPLANT
SHEARS HARMONIC ACE PLUS 36CM (ENDOMECHANICALS) ×3 IMPLANT
SLEEVE XCEL OPT CAN 5 100 (ENDOMECHANICALS) ×4 IMPLANT
SPIKE FLUID TRANSFER (MISCELLANEOUS) ×3 IMPLANT
SUT ETHILON 2 0 PS N (SUTURE) IMPLANT
SUT MNCRL AB 4-0 PS2 18 (SUTURE) ×3 IMPLANT
SYS BAG RETRIEVAL 10MM (BASKET) ×1
SYSTEM BAG RETRIEVAL 10MM (BASKET) ×2 IMPLANT
TOWEL OR 17X26 10 PK STRL BLUE (TOWEL DISPOSABLE) ×3 IMPLANT
TOWEL OR NON WOVEN STRL DISP B (DISPOSABLE) ×3 IMPLANT
TRAY FOLEY MTR SLVR 14FR STAT (SET/KITS/TRAYS/PACK) IMPLANT
TRAY FOLEY MTR SLVR 16FR STAT (SET/KITS/TRAYS/PACK) IMPLANT
TRAY LAPAROSCOPIC (CUSTOM PROCEDURE TRAY) ×3 IMPLANT
TROCAR BLADELESS OPT 5 100 (ENDOMECHANICALS) ×3 IMPLANT
TROCAR XCEL 12X100 BLDLESS (ENDOMECHANICALS) ×3 IMPLANT

## 2021-12-17 NOTE — Interval H&P Note (Signed)
History and Physical Interval Note: ? ?12/17/2021 ?10:17 AM ? ?[name]  has presented today for surgery, with the diagnosis of ENLARGED APPENDIX.  The various methods of treatment have been discussed with the patient and family. After consideration of risks, benefits and other options for treatment, the patient has consented to  Procedure(s): ?LAPAROSCOPIC APPENDECTOMY (N/A) as a surgical intervention.  The patient's history has been reviewed, patient examined, no change in status, stable for surgery.  I have reviewed the patient's chart and labs.  Questions were answered to the patient's satisfaction.   ? ? ?[name] Rich Brave ? ? ?

## 2021-12-17 NOTE — Transfer of Care (Signed)
Immediate Anesthesia Transfer of Care Note ? ?Patient: [name] ? ?Procedure(s) Performed: LAPAROSCOPIC APPENDECTOMY (Abdomen) ? ?Patient Location: PACU ? ?Anesthesia Type:General ? ?Level of Consciousness: awake, alert  and patient cooperative ? ?Airway & Oxygen Therapy: Patient Spontanous Breathing and Patient connected to face mask oxygen ? ?Post-op Assessment: Report given to RN and Post -op Vital signs reviewed and stable ? ?Post vital signs: Reviewed and stable ? ?Last Vitals:  ?Vitals Value Taken Time  ?BP 164/70 12/17/21 1251  ?Temp    ?Pulse 72 12/17/21 1254  ?Resp 12 12/17/21 1254  ?SpO2 100 % 12/17/21 1254  ?Vitals shown include unvalidated device data. ? ?Last Pain:  ?Vitals:  ? 12/17/21 0926  ?TempSrc:   ?PainSc: 7   ?   ? ?Patients Stated Pain Goal: 6 (12/17/21 2919) ? ?Complications: No notable events documented. ?

## 2021-12-17 NOTE — Discharge Instructions (Signed)
LAPAROSCOPIC SURGERY: POST OP INSTRUCTIONS ? ? ?EAT ?Gradually transition to a high fiber diet with a fiber supplement over the next few weeks after discharge.  Start with a pureed / full liquid diet (see below) ? ?WALK ?Walk an hour a day.  Control your pain to do that.   ? ?CONTROL PAIN ?Control pain so that you can walk, sleep, tolerate sneezing/coughing, go up/down stairs. ? ?HAVE A BOWEL MOVEMENT DAILY ?Keep your bowels regular to avoid problems.  OK to try a laxative to override constipation.  OK to use an antidairrheal to slow down diarrhea.  Call if not better after 2 tries ? ?CALL IF YOU HAVE PROBLEMS/CONCERNS ?Call if you are still struggling despite following these instructions. ?Call if you have concerns not answered by these instructions ? ? ? ?DIET: Follow a light bland diet & liquids the first 24 hours after arrival home, such as soup, liquids, starches, etc.  Be sure to drink plenty of fluids.  Quickly advance to a usual solid diet within a few days.  Avoid fast food or heavy meals as your are more likely to get nauseated or have irregular bowels.  A low-sugar, high-fiber diet for the rest of your life is ideal. ? ?Take your usually prescribed home medications unless otherwise directed. ? ?PAIN CONTROL: ?Pain is best controlled by a usual combination of three different methods TOGETHER: ?Ice/Heat ?Over the counter pain medication ?Prescription pain medication ?Most patients will experience some swelling and bruising around the incisions.  Ice packs or heating pads (30-60 minutes up to 6 times a day) will help. Use ice for the first few days to help decrease swelling and bruising, then switch to heat to help relax tight/sore spots and speed recovery.  Some people prefer to use ice alone, heat alone, alternating between ice & heat.  Experiment to what works for you.  Swelling and bruising can take several weeks to resolve.   ?It is helpful to take an over-the-counter pain medication regularly for the  first few days: ?Naproxen (Aleve, etc)  Two '220mg'$  tabs twice a day OR Ibuprofen (Advil, etc) Three '200mg'$  tabs four times a day (every meal & bedtime) AND ?Acetaminophen (Tylenol, etc) 500-'650mg'$  four times a day (every meal & bedtime) ?A  prescription for pain medication (such as oxycodone, hydrocodone, tramadol, gabapentin, methocarbamol, etc) should be given to you upon discharge.  Take your pain medication as prescribed, IF NEEDED.  ?If you are having problems/concerns with the prescription medicine (does not control pain, nausea, vomiting, rash, itching, etc), please call us [phone] to see if we need to switch you to a different pain medicine that will work better for you and/or control your side effect better. ?If you need a refill on your pain medication, please give [place] 48 hour notice.  contact your pharmacy.  They will contact our office to request authorization. Prescriptions will not be filled after 5 pm or on week-ends ? ?Avoid getting constipated.   ?Between the surgery and the pain medications, it is common to experience some constipation.   ?Increasing fluid intake and taking a fiber supplement (such as Metamucil, Citrucel, FiberCon, MiraLax, etc) 1-2 times a day regularly will usually help prevent this problem from occurring.   ?A mild laxative (prune juice, Milk of Magnesia, MiraLax, etc) should be taken according to package directions if there are no bowel movements after 48 hours.   ?Watch out for diarrhea.   ?If you have many loose bowel movements, simplify your diet  to bland foods & liquids for a few days.   ?Stop any stool softeners and decrease your fiber supplement.   ?Switching to mild anti-diarrheal medications (Kayopectate, Pepto Bismol) can help.   ?If this worsens or does not improve, please call us. ? ?Wash / shower every day.  You may shower over the skin glue which is waterproof ? ?Glue will flake off after about 2 weeks.  You may leave the incision open to air.  You may replace a  dressing/Band-Aid to cover the incision for comfort if you wish.  ? ?ACTIVITIES as tolerated:   ?You may resume regular (light) daily activities beginning the next day--such as daily self-care, walking, climbing stairs--gradually increasing activities as tolerated.  If you can walk 30 minutes without difficulty, it is safe to try more intense activity such as jogging, treadmill, bicycling, low-impact aerobics, swimming, etc. ?Save the most intensive and strenuous activity for last such as sit-ups, heavy lifting, contact sports, etc  Refrain from any heavy lifting or straining until you are off narcotics for pain control.   ?DO NOT PUSH THROUGH PAIN.  Let pain be your guide: If it hurts to do something, don't do it.  Pain is your body warning you to avoid that activity for another week until the pain goes down. ?You may drive when you are no longer taking prescription pain medication, you can comfortably wear a seatbelt, and you can safely maneuver your car and apply brakes. ?You may have sexual intercourse when it is comfortable. ? ?FOLLOW UP in our office ?Please call [facility] at [phone] to set up an appointment to see your surgeon in the office for a follow-up appointment approximately 2-3 weeks after your surgery. ?Make sure that you call for this appointment the day you arrive home to insure a convenient appointment time. ? ?10. IF YOU HAVE DISABILITY OR FAMILY LEAVE FORMS, BRING THEM TO THE OFFICE FOR PROCESSING.  DO NOT GIVE THEM TO YOUR DOCTOR. ? ? ?WHEN TO CALL us [phone]: ?Poor pain control ?Reactions / problems with new medications (rash/itching, nausea, etc)  ?Fever over 101.5 F (38.5 C) ?Inability to urinate ?Nausea and/or vomiting ?Worsening swelling or bruising ?Continued bleeding from incision. ?Increased pain, redness, or drainage from the incision ? ? The clinic staff is available to answer your questions during regular business hours (8:30am-5pm).  Please don?t hesitate to call and ask to  speak to one of our nurses for clinical concerns.  ? If you have a medical emergency, go to the nearest emergency room or call 911. ? A surgeon from [facility] is always on call at the hospitals ? ? ?[facility], [place] ?[address], [place], [place], [place]  [address] ? ?MAIN: [phone] ? TOLL FREE: [phone] ?  ?FAX [phone] ?[email or web address] ? ?

## 2021-12-17 NOTE — Op Note (Signed)
Operative Report ? ?Darryl Lent 66 y.o. female  ?[id number]  ?[id number]  ?12/17/2021 ? ?Surgeon: Romana Juniper MD FACS ?  ?Procedure performed: Laparoscopic Appendectomy, laparoscopic lysis of adhesions x15 minutes ?  ?Preop diagnosis: dilated appendix ?  ?Post-op diagnosis/intraop findings: same, extensive intraabdominal adhesions ?  ?Specimens: appendix ?  ?EBL: minimal ?  ?Complications: none ?  ?Description of procedure: After obtaining informed consent the patient was brought to the operating room. Antibiotics were administered. SCD's were applied. General endotracheal anesthesia was initiated and a formal time-out was performed.  Foley catheter was inserted which is removed and the case. The abdomen was prepped and draped in the usual sterile fashion and the abdomen was entered using an optical entry in the left subcostal margin and insufflated to 15 mmHg. The abdomen was inspected and there is no evidence of injury from our entry.  There are extensive adhesions of small bowel to the anterior abdominal wall along the midline incision extending down to just below the umbilicus, as well as adhesions of the omentum and transverse colon in the lower midline.  There are no visible lesions in the liver or along the peritoneal surface.  A left lower quadrant 12 mm trocar was introduced under direct visualization following infiltration with local.  Cold sharp dissection was employed to free the lower abdominal wall of the more filmy adhesions, allowing the transverse colon to fall away from the lower midline.  There are also small bowel adhesions to the transverse colon as well as many interloop adhesions which are left in situ.  Once enough space had been freed, a suprapubic 5 mm trocar was placed and additional inspection noted that the right hemiabdomen is fairly free of adhesions and so a 5 mm trocar was placed in the periumbilical region to the right of the umbilicus, well away from the area where the small  bowel was fused to the anterior abdominal wall.  ?The patient was then placed in Trendelenburg and rotated to the left and the small bowel was reflected cephalad. The appendix was visualized: It appears dilated but uninflamed with firm tissue at the tip of the appendix.  The appendix was retrocecal. A combination of blunt dissection and harmonic scalpel were used to free it of its retroperitoneal attachments. Great care was taken to ensure no injury to surrounding retroperitoneal structures, cecum or terminal ileum. A window was created at the base of the appendix and a blue load linear cutting stapler was used to transect the appendix from the cecum, taking a small cuff of healthy cecum with the specimen.  The harmonic scalpel was then used to transect the appendiceal mesentery. Hemostasis was ensured. The appendix was placed in an Endo Catch bag and removed through our 12 mm trocar site.  The bowel and abdominal contents were again inspected and confirmed to be free of injury.  The 63m trocar site in the left lower quadrant was closed with a 0 vicryl in the fascia under direct visualization using a PMI device. The abdomen was desufflated and all trocars removed. The skin incisions were closed with subcuticular 4-0 monocryl and Dermabond. The patient was awakened, extubated and transported to the recovery room in stable condition.  ?  ?All counts were correct at the completion of the case.  ?

## 2021-12-17 NOTE — Anesthesia Procedure Notes (Signed)
Procedure Name: Intubation ?Date/Time: 12/17/2021 11:32 AM ?Performed by: Eben Burow, CRNA ?Pre-anesthesia Checklist: Patient identified, Emergency Drugs available, Suction available, Patient being monitored and Timeout performed ?Patient Re-evaluated:Patient Re-evaluated prior to induction ?Oxygen Delivery Method: Circle system utilized ?Preoxygenation: Pre-oxygenation with 100% oxygen ?Induction Type: IV induction ?Ventilation: Mask ventilation without difficulty ?Laryngoscope Size: Mac and 4 ?Grade View: Grade I ?Tube type: Oral ?Tube size: 7.0 mm ?Number of attempts: 1 ?Airway Equipment and Method: Stylet ?Placement Confirmation: ETT inserted through vocal cords under direct vision, positive ETCO2 and breath sounds checked- equal and bilateral ?Secured at: 22 cm ?Tube secured with: Tape ?Dental Injury: Teeth and Oropharynx as per pre-operative assessment  ? ? ? ? ?

## 2021-12-17 NOTE — Anesthesia Preprocedure Evaluation (Addendum)
Anesthesia Evaluation  ?Patient identified by MRN, date of birth, ID band ?Patient awake ? ? ? ?Reviewed: ?Allergy & Precautions, NPO status , Patient's Chart, lab work & pertinent test results, reviewed documented beta blocker date and time  ? ?Airway ?Mallampati: II ? ?TM Distance: >3 FB ?Neck ROM: Full ? ? ? Dental ? ?(+) Caps, Dental Advisory Given, Poor Dentition, Missing,  ?  ?Pulmonary ?pneumonia, resolved, former smoker,  ?  ?Pulmonary exam normal ?breath sounds clear to auscultation ? ? ? ? ? ? Cardiovascular ?Exercise Tolerance: Poor ?hypertension, Pt. on medications ?+ Peripheral Vascular Disease and +CHF  ?Normal cardiovascular exam+ dysrhythmias Atrial Fibrillation  ?Rhythm:Regular Rate:Normal ? ?EKG 12/13/21 ?NSR, low voltage ? ?Hx/o atrial fibrillation- currently in NSR ?  ?Neuro/Psych ?PSYCHIATRIC DISORDERS Anxiety Depression Diabetic peripheral neuropathy ? Neuromuscular disease   ? GI/Hepatic ?Neg liver ROS, GERD  Medicated and Controlled,Enlarged appendix ?  ?Endo/Other  ?diabetes, Poorly Controlled, Type 2, Insulin DependentHyperlipidemia ?Hx/o gout ?Obesity ? Renal/GU ?Renal InsufficiencyRenal disease  ?negative genitourinary ?  ?Musculoskeletal ? ?(+) Arthritis , Osteoarthritis,   ? Abdominal ?(+) + obese,   ?Peds ? Hematology ?Eliquis therapy- last dose 12/14/21   ?Anesthesia Other Findings ? ? Reproductive/Obstetrics ? ?  ? ? ? ? ? ? ? ? ? ? ? ? ? ?  ?  ? ? ? ? ? ? ? ?Anesthesia Physical ?Anesthesia Plan ? ?ASA: 3 ? ?Anesthesia Plan: General  ? ?Post-op Pain Management: Tylenol PO (pre-op)* and Precedex  ? ?Induction: Intravenous and Cricoid pressure planned ? ?PONV Risk Score and Plan: 4 or greater and Treatment may vary due to age or medical condition and Ondansetron ? ?Airway Management Planned: Oral ETT ? ?Additional Equipment: None ? ?Intra-op Plan:  ? ?Post-operative Plan: Extubation in OR ? ?Informed Consent: I have reviewed the patients History and  Physical, chart, labs and discussed the procedure including the risks, benefits and alternatives for the proposed anesthesia with the patient or authorized representative who has indicated his/her understanding and acceptance.  ? ? ? ?Dental advisory given ? ?Plan Discussed with: CRNA and Anesthesiologist ? ?Anesthesia Plan Comments:   ? ? ? ? ? ? ?Anesthesia Quick Evaluation ? ?

## 2021-12-17 NOTE — Anesthesia Postprocedure Evaluation (Signed)
Anesthesia Post Note ? ?Patient: [name] ? ?Procedure(s) Performed: LAPAROSCOPIC APPENDECTOMY (Abdomen) ? ?  ? ?Patient location during evaluation: PACU ?Anesthesia Type: General ?Level of consciousness: awake and alert and oriented ?Pain management: pain level controlled ?Vital Signs Assessment: post-procedure vital signs reviewed and stable ?Respiratory status: spontaneous breathing, nonlabored ventilation and respiratory function stable ?Cardiovascular status: blood pressure returned to baseline and stable ?Postop Assessment: no apparent nausea or vomiting ?Anesthetic complications: no ? ? ?No notable events documented. ? ?Last Vitals:  ?Vitals:  ? 12/17/21 1345 12/17/21 1400  ?BP: 140/70 139/75  ?Pulse: 65 65  ?Resp: 14 17  ?Temp:  36.5 ?C  ?SpO2: 100% 100%  ?  ?Last Pain:  ?Vitals:  ? 12/17/21 1400  ?TempSrc:   ?PainSc: 6   ? ? ?  ?  ?  ?  ?  ?  ? ?Yuchen Fedor A. ? ? ? ? ?

## 2021-12-18 ENCOUNTER — Encounter (HOSPITAL_COMMUNITY): Payer: Self-pay | Admitting: Surgery

## 2021-12-18 LAB — SURGICAL PATHOLOGY

## 2022-01-03 ENCOUNTER — Ambulatory Visit (INDEPENDENT_AMBULATORY_CARE_PROVIDER_SITE_OTHER): Payer: Medicare Other | Admitting: Podiatry

## 2022-01-03 DIAGNOSIS — M79675 Pain in left toe(s): Secondary | ICD-10-CM | POA: Diagnosis not present

## 2022-01-03 DIAGNOSIS — B351 Tinea unguium: Secondary | ICD-10-CM

## 2022-01-03 DIAGNOSIS — M79674 Pain in right toe(s): Secondary | ICD-10-CM | POA: Diagnosis not present

## 2022-01-03 DIAGNOSIS — E1149 Type 2 diabetes mellitus with other diabetic neurological complication: Secondary | ICD-10-CM | POA: Diagnosis not present

## 2022-01-06 NOTE — Progress Notes (Signed)
Subjective: ?66 year old female presents the office today for diabetic foot exam and to have the nails trimmed as are getting elongated and she is having trouble trimming them herself.  No swelling or redness or any drainage.  She denies any open sores.  ? ? ?Objective: ?AAO x3, NAD ?DP/PT pulses palpable bilaterally, CRT less than 3 seconds ?Sensation decreased with Semmes Weinstein monofilament ?Nails are hypertrophic, dystrophic, brittle, discolored, elongated ?9. No surrounding redness or drainage. Tenderness nails 1-5 bilaterally except for the left fifth toenail which has very minimal nail growth.  There is no open lesions noted today. ?No pain with calf compression, swelling, warmth, erythema ? ?Assessment: ?Symptomatic onychomycosis, type 2 diabetes with neuropathy ? ?Plan: ?-All treatment options discussed with the patient including all alternatives, risks, complications.  ?-Sharply debrided nails x9 without any complications or bleeding. ?-Continue to monitor neuropathy symptoms. ?-Daily foot inspection, glucose control.  Sugar has been much better she reports that she has been exercising, losing weight.  Overall she feels much better. ?-Patient encouraged to call the office with any questions, concerns, change in symptoms.  ? ?Trula Slade DPM ? ? ?

## 2022-01-14 ENCOUNTER — Ambulatory Visit: Payer: BC Managed Care – PPO | Admitting: Nutrition

## 2022-01-23 ENCOUNTER — Encounter (INDEPENDENT_AMBULATORY_CARE_PROVIDER_SITE_OTHER): Payer: Medicare Other | Admitting: Ophthalmology

## 2022-01-26 ENCOUNTER — Other Ambulatory Visit: Payer: Self-pay | Admitting: Podiatry

## 2022-01-28 ENCOUNTER — Encounter: Payer: Medicare Other | Attending: Internal Medicine | Admitting: Nutrition

## 2022-01-28 DIAGNOSIS — E1165 Type 2 diabetes mellitus with hyperglycemia: Secondary | ICD-10-CM | POA: Diagnosis present

## 2022-01-28 DIAGNOSIS — Z713 Dietary counseling and surveillance: Secondary | ICD-10-CM | POA: Insufficient documentation

## 2022-01-28 DIAGNOSIS — E1065 Type 1 diabetes mellitus with hyperglycemia: Secondary | ICD-10-CM

## 2022-01-30 NOTE — Patient Instructions (Addendum)
1.Increase Tesiba dose by 2u every 3-5 days until morning blood sugars are less than 130 ?2. Carry glucose tablet with you when walking in the AM. ?3. Once morning blood sugars are down.  Test blood sugars before bfast and then before lunch..  If lunch reading is higher than 130, increase novolog dose before breakfast by 1-2 units.  ?4.  Once blood sugar readings are below 130 before lunch, do the same as above,a nd adjust eh Novolog dose before lunch. ?5. Once blood sugar readings are down before supper, do the same as above before and after supper, and if readings are high at bedtime, increase Novolog dose by 1-2u unitl bedtime readings are less than 140.   ?6.  Once bedtime readings are lower, my need to reduce the Tresiba dose.   ?7.  Stop cereal in AM.  Switch to 1 ounce of low fat protein and 30 grams of carb.   ?

## 2022-01-30 NOTE — Progress Notes (Signed)
Patient is here today because she wants to get her blood sugars under control and wants to loose weight ?Insulin dose:  Tresiba: 12u, 8:30 AM.  Says forgets 1X.wk     ?                       Novolog: 4u acB and acL, 5u acS. ?SBGM:  Dexcom G6:  Download:  last 14 days; Average blood  sugar:     241:   ?Exericise:  walks for 1-2 hours q AM  4-5 days/wk. ?She is wanting to loose weight- goal: 20 pounds  ?Typical Day: ?7:30 Up  takes [place] at 8:30 AM ?9:30:-10AM:  1/2 cup of cereal with milk 2%.  Sometimes fruit  ?                     Coffee with sugar free creamer ?                     Or: 1 egg with small bagel, or english muffin ?                     Or egg sandwich with light mayo. ?2:30-3PM: lunch:  Tuna, chicken, or egg salad sandwich on ?                      Whole wheat bread, coffee or water ?5-7: supper:  3-4 ounces protein, 2 veg., 1 istarchy-30grams ?                    Water to drink ?8PM: certs 1-2 if wants something sweet ?10PM: bed ?Denies eating between meals, ?Discussion: ? Effects of insulin/blood sugar on weight gain and effectl of high blood sugars on insulin doses she is taking, with the need to get blood sugar readings down, before weight loss. ? How each insulin works, and what insulin effects what blood sugar reading. ?Need to get FBSs down below 130.  Need to increase Tresiba by 2u q 3-4 days until FBS is below 130.  She reported good understanding of this.  Written instructions given to her for this. ?Once FBS is below 130, need to test acL to see effect of Novolog dose on breakfast meal.  Stop eating cold cereal and milk.  Other suggestions given to her for breakfast choices. ?Do same for lunch-test ac and acS to see if Novolog dose covers lunch meal.  Then test acS and HS to see if Novolog covers supper meal.  If blood sugars are over 140 before next meal, will need to increase dose by 1-2u.   ?Effects of exercise on blood sugar.  She was encouraged to see effects of walking in the AM  without insulin on her blood sugar readings.  Is she going up/down, or staying the same.  Suggested she carry glucose tablets, once FBS is down before exercise.  She reported good understanding of this.   ?Need for glucose tablets while exercising when FBSs come down. ?Need to reduce fat intake by limiting oil in cooking, mayo on sandwiches to 1tsp.,not 1 T, meat portions to 3 ounces. ?Stop cold cereal and milk.--suggestions given for protein and 15-25 grams of carb for breakast. ? ?

## 2022-02-25 ENCOUNTER — Ambulatory Visit: Payer: BC Managed Care – PPO | Admitting: Nutrition

## 2022-02-28 ENCOUNTER — Telehealth: Payer: Self-pay | Admitting: Nutrition

## 2022-02-28 NOTE — Telephone Encounter (Signed)
LVM that I am needing to cancel/reschedule appoiintment on 03/18/22.  Number given to call back

## 2022-03-04 ENCOUNTER — Encounter (HOSPITAL_COMMUNITY): Payer: Self-pay | Admitting: Emergency Medicine

## 2022-03-04 ENCOUNTER — Other Ambulatory Visit: Payer: Self-pay

## 2022-03-04 ENCOUNTER — Inpatient Hospital Stay (HOSPITAL_COMMUNITY)
Admission: EM | Admit: 2022-03-04 | Discharge: 2022-03-07 | DRG: 580 | Disposition: A | Discharge status: Home / Self Care | Payer: Medicare Other | Attending: Internal Medicine | Admitting: Internal Medicine

## 2022-03-04 ENCOUNTER — Emergency Department (HOSPITAL_COMMUNITY): Payer: Medicare Other

## 2022-03-04 DIAGNOSIS — L03114 Cellulitis of left upper limb: Secondary | ICD-10-CM | POA: Diagnosis not present

## 2022-03-04 DIAGNOSIS — Z6832 Body mass index (BMI) 32.0-32.9, adult: Secondary | ICD-10-CM

## 2022-03-04 DIAGNOSIS — L0390 Cellulitis, unspecified: Secondary | ICD-10-CM | POA: Diagnosis not present

## 2022-03-04 DIAGNOSIS — X58XXXA Exposure to other specified factors, initial encounter: Secondary | ICD-10-CM | POA: Diagnosis present

## 2022-03-04 DIAGNOSIS — M1990 Unspecified osteoarthritis, unspecified site: Secondary | ICD-10-CM | POA: Diagnosis present

## 2022-03-04 DIAGNOSIS — I130 Hypertensive heart and chronic kidney disease with heart failure and stage 1 through stage 4 chronic kidney disease, or unspecified chronic kidney disease: Secondary | ICD-10-CM | POA: Diagnosis present

## 2022-03-04 DIAGNOSIS — H409 Unspecified glaucoma: Secondary | ICD-10-CM | POA: Diagnosis present

## 2022-03-04 DIAGNOSIS — L0291 Cutaneous abscess, unspecified: Principal | ICD-10-CM

## 2022-03-04 DIAGNOSIS — I5032 Chronic diastolic (congestive) heart failure: Secondary | ICD-10-CM | POA: Diagnosis not present

## 2022-03-04 DIAGNOSIS — Z9049 Acquired absence of other specified parts of digestive tract: Secondary | ICD-10-CM

## 2022-03-04 DIAGNOSIS — M7989 Other specified soft tissue disorders: Secondary | ICD-10-CM | POA: Diagnosis not present

## 2022-03-04 DIAGNOSIS — Z90410 Acquired total absence of pancreas: Secondary | ICD-10-CM

## 2022-03-04 DIAGNOSIS — N1831 Chronic kidney disease, stage 3a: Secondary | ICD-10-CM | POA: Diagnosis present

## 2022-03-04 DIAGNOSIS — Z888 Allergy status to other drugs, medicaments and biological substances status: Secondary | ICD-10-CM

## 2022-03-04 DIAGNOSIS — I480 Paroxysmal atrial fibrillation: Secondary | ICD-10-CM | POA: Diagnosis present

## 2022-03-04 DIAGNOSIS — E1169 Type 2 diabetes mellitus with other specified complication: Secondary | ICD-10-CM

## 2022-03-04 DIAGNOSIS — Z881 Allergy status to other antibiotic agents status: Secondary | ICD-10-CM

## 2022-03-04 DIAGNOSIS — Z9081 Acquired absence of spleen: Secondary | ICD-10-CM

## 2022-03-04 DIAGNOSIS — Z79890 Hormone replacement therapy (postmenopausal): Secondary | ICD-10-CM

## 2022-03-04 DIAGNOSIS — S61432A Puncture wound without foreign body of left hand, initial encounter: Secondary | ICD-10-CM | POA: Diagnosis present

## 2022-03-04 DIAGNOSIS — F419 Anxiety disorder, unspecified: Secondary | ICD-10-CM | POA: Diagnosis present

## 2022-03-04 DIAGNOSIS — Z90710 Acquired absence of both cervix and uterus: Secondary | ICD-10-CM

## 2022-03-04 DIAGNOSIS — I10 Essential (primary) hypertension: Secondary | ICD-10-CM | POA: Diagnosis present

## 2022-03-04 DIAGNOSIS — Z8541 Personal history of malignant neoplasm of cervix uteri: Secondary | ICD-10-CM

## 2022-03-04 DIAGNOSIS — M659 Synovitis and tenosynovitis, unspecified: Secondary | ICD-10-CM | POA: Diagnosis present

## 2022-03-04 DIAGNOSIS — L02512 Cutaneous abscess of left hand: Secondary | ICD-10-CM | POA: Diagnosis present

## 2022-03-04 DIAGNOSIS — E1140 Type 2 diabetes mellitus with diabetic neuropathy, unspecified: Secondary | ICD-10-CM | POA: Diagnosis present

## 2022-03-04 DIAGNOSIS — M109 Gout, unspecified: Secondary | ICD-10-CM | POA: Diagnosis present

## 2022-03-04 DIAGNOSIS — E1122 Type 2 diabetes mellitus with diabetic chronic kidney disease: Secondary | ICD-10-CM | POA: Diagnosis present

## 2022-03-04 DIAGNOSIS — E119 Type 2 diabetes mellitus without complications: Secondary | ICD-10-CM

## 2022-03-04 DIAGNOSIS — Z885 Allergy status to narcotic agent status: Secondary | ICD-10-CM

## 2022-03-04 DIAGNOSIS — E782 Mixed hyperlipidemia: Secondary | ICD-10-CM | POA: Diagnosis present

## 2022-03-04 DIAGNOSIS — E669 Obesity, unspecified: Secondary | ICD-10-CM | POA: Diagnosis present

## 2022-03-04 DIAGNOSIS — Z880 Allergy status to penicillin: Secondary | ICD-10-CM

## 2022-03-04 DIAGNOSIS — E875 Hyperkalemia: Secondary | ICD-10-CM | POA: Diagnosis present

## 2022-03-04 DIAGNOSIS — Z833 Family history of diabetes mellitus: Secondary | ICD-10-CM

## 2022-03-04 DIAGNOSIS — Z8701 Personal history of pneumonia (recurrent): Secondary | ICD-10-CM

## 2022-03-04 DIAGNOSIS — Z87891 Personal history of nicotine dependence: Secondary | ICD-10-CM

## 2022-03-04 DIAGNOSIS — Z8249 Family history of ischemic heart disease and other diseases of the circulatory system: Secondary | ICD-10-CM

## 2022-03-04 DIAGNOSIS — K219 Gastro-esophageal reflux disease without esophagitis: Secondary | ICD-10-CM | POA: Diagnosis present

## 2022-03-04 DIAGNOSIS — Z79899 Other long term (current) drug therapy: Secondary | ICD-10-CM

## 2022-03-04 DIAGNOSIS — I2510 Atherosclerotic heart disease of native coronary artery without angina pectoris: Secondary | ICD-10-CM | POA: Diagnosis present

## 2022-03-04 DIAGNOSIS — Z794 Long term (current) use of insulin: Secondary | ICD-10-CM

## 2022-03-04 DIAGNOSIS — E1165 Type 2 diabetes mellitus with hyperglycemia: Secondary | ICD-10-CM | POA: Diagnosis present

## 2022-03-04 DIAGNOSIS — Z7901 Long term (current) use of anticoagulants: Secondary | ICD-10-CM

## 2022-03-04 LAB — CBC WITH DIFFERENTIAL/PLATELET
Abs Immature Granulocytes: 0.04 10*3/uL (ref 0.00–0.07)
Basophils Absolute: 0.2 10*3/uL — ABNORMAL HIGH (ref 0.0–0.1)
Basophils Relative: 1 %
Eosinophils Absolute: 0.3 10*3/uL (ref 0.0–0.5)
Eosinophils Relative: 2 %
HCT: 41 % (ref 36.0–46.0)
Hemoglobin: 12.7 g/dL (ref 12.0–15.0)
Immature Granulocytes: 0 %
Lymphocytes Relative: 27 %
Lymphs Abs: 3.7 10*3/uL (ref 0.7–4.0)
MCH: 25.5 pg — ABNORMAL LOW (ref 26.0–34.0)
MCHC: 31 g/dL (ref 30.0–36.0)
MCV: 82.2 fL (ref 80.0–100.0)
Monocytes Absolute: 1.1 10*3/uL — ABNORMAL HIGH (ref 0.1–1.0)
Monocytes Relative: 8 %
Neutro Abs: 8.6 10*3/uL — ABNORMAL HIGH (ref 1.7–7.7)
Neutrophils Relative %: 62 %
Platelets: 402 10*3/uL — ABNORMAL HIGH (ref 150–400)
RBC: 4.99 MIL/uL (ref 3.87–5.11)
RDW: 16.6 % — ABNORMAL HIGH (ref 11.5–15.5)
WBC: 13.8 10*3/uL — ABNORMAL HIGH (ref 4.0–10.5)
nRBC: 0 % (ref 0.0–0.2)

## 2022-03-04 LAB — BASIC METABOLIC PANEL
Anion gap: 7 (ref 5–15)
BUN: 25 mg/dL — ABNORMAL HIGH (ref 8–23)
CO2: 29 mmol/L (ref 22–32)
Calcium: 9.3 mg/dL (ref 8.9–10.3)
Chloride: 100 mmol/L (ref 98–111)
Creatinine, Ser: 1.22 mg/dL — ABNORMAL HIGH (ref 0.44–1.00)
GFR, Estimated: 48 mL/min — ABNORMAL LOW (ref >60)
Glucose, Bld: 364 mg/dL — ABNORMAL HIGH (ref 70–99)
Potassium: 5.7 mmol/L — ABNORMAL HIGH (ref 3.5–5.1)
Sodium: 136 mmol/L (ref 135–145)

## 2022-03-04 MED ORDER — SODIUM CHLORIDE 0.9 % IV SOLN
2.0000 g | Freq: Once | INTRAVENOUS | Status: AC
  Administered 2022-03-05: 2 g via INTRAVENOUS
  Filled 2022-03-04: qty 12.5

## 2022-03-04 MED ORDER — SODIUM CHLORIDE 0.9 % IV BOLUS
1000.0000 mL | Freq: Once | INTRAVENOUS | Status: AC
  Administered 2022-03-05: 1000 mL via INTRAVENOUS

## 2022-03-04 MED ORDER — VANCOMYCIN HCL 1750 MG/350ML IV SOLN
1750.0000 mg | Freq: Once | INTRAVENOUS | Status: AC
Start: 2022-03-04 — End: 2022-03-05
  Administered 2022-03-05: 1750 mg via INTRAVENOUS
  Filled 2022-03-04: qty 350

## 2022-03-04 NOTE — Progress Notes (Signed)
A consult was received from an ED physician for Vancomycin per pharmacy dosing.  The patient's profile has been reviewed for ht/wt/allergies/indication/available labs.   A one time order has been placed for Vancomycin '1750mg'$  IV.  Further antibiotics/pharmacy consults should be ordered by admitting physician if indicated.                       Thank you, Netta Cedars PharmD 03/04/2022  11:12 PM

## 2022-03-04 NOTE — ED Provider Notes (Signed)
[facility] Emergency Department Provider Note MRN:  [id number]  Arrival date & time: 03/05/22     Chief Complaint   Cellulitis   History of Present Illness   [name] is a 66 y.o. year-old female with a history of diabetes, CHF presenting to the ED with chief complaint of cellulitis.  Wednesday of last week patient accidentally scratched her finger on a rose bush thorn.  During the initial scratch she yanked her hand away and this caused an additional puncture wound to the medial aspect of the finger.  Saw her doctor the next day and was started on Keflex.  Despite 5 full days of Keflex 4 times a day the pain swelling and redness has worsened.  A lot of difficulty moving the affected finger.  Feeling some chills at home as well.  Review of Systems  A thorough review of systems was obtained and all systems are negative except as noted in the HPI and PMH.   Patient's Health History    Past Medical History:  Diagnosis Date   Anxiety    Arthritis    Cancer ([place]) 1977   CERVICAL   CHF (congestive heart failure) ([place])    pt. denies   Diabetes mellitus without complication ([place])    type 2   Dysrhythmia    Afib   GERD (gastroesophageal reflux disease) 11/14/2013   Gout    Hypertension    Mixed hyperlipidemia 11/14/2013   Neuromuscular disorder ([facility])    neuropathy feet   Pneumonia     Past Surgical History:  Procedure Laterality Date   ABDOMINAL HYSTERECTOMY  1977   CERVICAL CA   CHOLECYSTECTOMY     EYE SURGERY     6/3 LASER FOR GLAUCOMA, 9/03 CE/IOL IMPLANTS   INGUINAL LYMPH NODE BIOPSY Right 06/08/2018   Procedure: RIGHT INGUINAL LYMPH NODE EXCISIONAL BIOPSY;  Surgeon: Clovis Riley, MD;  Location: [facility];  Service: General;  Laterality: Right;   LAPAROSCOPIC APPENDECTOMY N/A 12/17/2021   Procedure: LAPAROSCOPIC APPENDECTOMY;  Surgeon: Clovis Riley, MD;  Location: [facility];  Service: General;  Laterality: N/A;   pancreatectomy      SHOULDER CLOSED REDUCTION Left 02/03/2013   Procedure: CLOSED MANIPULATION SHOULDER;  Surgeon: Vickey Huger, MD;  Location: [facility];  Service: Orthopedics;  Laterality: Left;  CLOSED MANIPULATION LEFT SHOULDER   SPLENECTOMY     TONSILLECTOMY  1970    Family History  Problem Relation Age of Onset   Dementia Mother    Heart disease Mother    Hypertension Mother    COPD Mother    Breast cancer Mother 63   Heart disease Father    Diabetes Father    Glaucoma Father    Cancer Father        BLADDER   Depression Sister     Social History   Socioeconomic History   Marital status: Divorced    Spouse name: Not on file   Number of children: Not on file   Years of education: Not on file   Highest education level: Not on file  Occupational History   Not on file  Tobacco Use   Smoking status: Former    Years: 20.00    Types: Cigarettes    Quit date: 09/23/1989    Years since quitting: 32.4   Smokeless tobacco: Never  Vaping Use   Vaping Use: Never used  Substance and Sexual Activity   Alcohol use: No    Alcohol/week: 0.0 standard drinks  of alcohol   Drug use: No    Comment: quit>20years ago   Sexual activity: Not Currently  Other Topics Concern   Not on file  Social History Narrative   Not on file   Social Determinants of Health   Financial Resource Strain: Not on file  Food Insecurity: Not on file  Transportation Needs: Not on file  Physical Activity: Not on file  Stress: Not on file  Social Connections: Not on file  Intimate Partner Violence: Not on file     Physical Exam   Vitals:   03/04/22 2246 03/04/22 2345  BP: (!) 160/73 (!) 115/101  Pulse: 65 66  Resp: 18 18  Temp: 98.7 F (37.1 C)   SpO2: 96% 93%    CONSTITUTIONAL: Well-appearing, NAD NEURO/PSYCH:  Alert and oriented x 3, no focal deficits EYES:  eyes equal and reactive ENT/NECK:  no LAD, no JVD CARDIO: Regular rate, well-perfused, normal S1 and S2 PULM:  CTAB no wheezing or rhonchi GI/GU:   non-distended, non-tender MSK/SPINE:  No gross deformities, no edema SKIN:  no rash, atraumatic   *Additional and/or pertinent findings included in MDM below  Diagnostic and Interventional Summary    EKG Interpretation  Date/Time:    Ventricular Rate:    PR Interval:    QRS Duration:   QT Interval:    QTC Calculation:   R Axis:     Text Interpretation:         Labs Reviewed  CBC WITH DIFFERENTIAL/PLATELET - Abnormal; Notable for the following components:      Result Value   WBC 13.8 (*)    MCH 25.5 (*)    RDW 16.6 (*)    Platelets 402 (*)    Neutro Abs 8.6 (*)    Monocytes Absolute 1.1 (*)    Basophils Absolute 0.2 (*)    All other components within normal limits  BASIC METABOLIC PANEL - Abnormal; Notable for the following components:   Potassium 5.7 (*)    Glucose, Bld 364 (*)    BUN 25 (*)    Creatinine, Ser 1.22 (*)    GFR, Estimated 48 (*)    All other components within normal limits  HEMOGLOBIN A1C  CK  HEPATIC FUNCTION PANEL  MAGNESIUM  PHOSPHORUS  PREALBUMIN  BASIC METABOLIC PANEL  HIV ANTIBODY (ROUTINE TESTING W REFLEX)  COMPREHENSIVE METABOLIC PANEL  CBC  PROCALCITONIN    DG Hand Complete Left  Final Result      Medications  vancomycin (VANCOREADY) IVPB 1750 mg/350 mL (1,750 mg Intravenous New Bag/Given 03/05/22 0038)  insulin aspart (novoLOG) injection 0-9 Units (has no administration in time range)  0.9 %  sodium chloride infusion (has no administration in time range)  acetaminophen (TYLENOL) tablet 650 mg (has no administration in time range)    Or  acetaminophen (TYLENOL) suppository 650 mg (has no administration in time range)  HYDROcodone-acetaminophen (NORCO/VICODIN) 5-325 MG per tablet 1-2 tablet (has no administration in time range)  fentaNYL (SUBLIMAZE) injection 12.5-50 mcg (has no administration in time range)  lidocaine (PF) (XYLOCAINE) 1 % injection 5 mL (has no administration in time range)  ceFEPIme (MAXIPIME) 2 g in sodium  chloride 0.9 % 100 mL IVPB (has no administration in time range)  vancomycin (VANCOREADY) IVPB 1250 mg/250 mL (has no administration in time range)  sodium chloride 0.9 % bolus 1,000 mL (1,000 mLs Intravenous New Bag/Given 03/05/22 0006)  ceFEPIme (MAXIPIME) 2 g in sodium chloride 0.9 % 100 mL IVPB (2 g Intravenous  New Bag/Given 03/05/22 0003)  LORazepam (ATIVAN) injection 1 mg (1 mg Intravenous Given 03/05/22 0046)     Procedures  /  Critical Care .Marland KitchenIncision and Drainage  Date/Time: 03/05/2022 1:13 AM  Performed by: Maudie Flakes, MD Authorized by: Maudie Flakes, MD   Consent:    Consent obtained:  Verbal   Consent given by:  Patient   Risks, benefits, and alternatives were discussed: yes     Risks discussed:  Bleeding, damage to other organs, infection, incomplete drainage and pain   Alternatives discussed:  No treatment Universal protocol:    Procedure explained and questions answered to patient or proxy's satisfaction: yes     Immediately prior to procedure, a time out was called: yes     Patient identity confirmed:  Verbally with patient Location:    Type:  Abscess   Size:  1cm   Location: base of left index finger, palmar. Pre-procedure details:    Skin preparation:  Chlorhexidine Sedation:    Sedation type:  Anxiolysis ('1mg'$  ativan) Anesthesia:    Anesthesia method:  Local infiltration   Local anesthetic:  Lidocaine 1% w/o epi Procedure type:    Complexity:  Complex Procedure details:    Incision type: stellate.   Incision depth:  Subcutaneous   Wound management:  Probed and deloculated, irrigated with saline, extensive cleaning and debrided   Drainage:  Bloody and purulent   Drainage amount:  Scant   Wound treatment:  Wound left open   Packing materials:  None Post-procedure details:    Procedure completion:  Tolerated well, no immediate complications Comments:     Wound was thoroughly rinsed and debrided.  Some devitalized central skin was removed with  scissors.  Suspect some retained vegetative material was also removed from the wound, along with purulence.   ED Course and Medical Decision Making  Initial Impression and Ddx Cellulitis failing outpatient therapy, given the exposure sporotrichosis was considered as well as a pseudomonal infection or some type of resistant organism.  Having some chills, possibly subjective fevers at home.  Leukocytosis noted on laboratory evaluation.  Given the worsening nature of the redness, anticipating admission on IV antibiotics, will reach out to hand surgery for further recommendations as well.  Past medical/surgical history that increases complexity of ED encounter: Diabetes  Interpretation of Diagnostics I personally reviewed the hand x-ray and my interpretation is as follows: No obvious foreign body or bony abnormality  Labs overall reassuring, mild leukocytosis.  Patient Reassessment and Ultimate Disposition/Management     Case discussed with Dr. Greta Doom of hand surgery, given the suspicion for superficial abscess rather than deep tissue abscess, I performed the incision and drainage described above.  Dr. Greta Doom will see the patient in the morning.  Admitted to medicine for further care.  Patient management required discussion with the following services or consulting groups:  Hospitalist Service and Hand Surgery  Complexity of Problems Addressed Acute illness or injury that poses threat of life of bodily function  Additional Data Reviewed and Analyzed Further history obtained from: None  Additional Factors Impacting ED Encounter Risk Minor Procedures and Consideration of hospitalization  Barth Kirks. [place] Small, [place] [email or web address]  Final Clinical Impressions(s) / ED Diagnoses     ICD-10-CM   1. Abscess  L02.91     2. Cellulitis, unspecified cellulitis site  L03.90       ED Discharge Orders     None  Discharge  Instructions Discussed with and Provided to Patient:   Discharge Instructions   None      Maudie Flakes, MD 03/05/22 [phone]

## 2022-03-04 NOTE — H&P (Signed)
[name]:[id number] DOB: Jun 11, 1956 DOA: 03/04/2022     PCP: Johna Roles, PA   Outpatient Specialists: * NONE CARDS:   Dr. Doylene Canard  Podiatry [facility]    Pulmonary  Dr. Elsworth Soho  Endocrinology Dr. Buddy Duty     Patient arrived to ER on 03/04/22 at 1556 Referred by Attending Maudie Flakes, MD   Patient coming from:    home Lives  With family    Chief Complaint:   Chief Complaint  Patient presents with   Cellulitis    HPI: [name] is a 66 y.o. female with medical history significant of diabetes atrial fibrillation history of CHF GERD gout hypertension HLD neuropathy, sp splenectomy    Presented with   hand swelling and pain Patient reports she punctured her hand on a rose bush last week was seen by primary care provider started on oral antibiotics but continued to have swelling and redness of half hand She has been taking Keflex 4 times a day but continues to still swell.  Cannot even straighten out her finger.  Started to extend erythema into her dorsal of the hand.  Chills but no fever     Regarding pertinent Chronic problems:     Hyperlipidemia -  on statins does not remember what she takes Lipid Panel     Component Value Date/Time   CHOL 117 05/16/2019 0359   TRIG 114 05/16/2019 0359   HDL 49 05/16/2019 0359   CHOLHDL 2.4 05/16/2019 0359   VLDL 23 05/16/2019 0359   LDLCALC 45 05/16/2019 0359     HTN on Norvasc hydrochlorothiazide, Cozaar, Toprol   chronic CHF diastolic/ - last echo [id number] is mild concentric  left ventricular hypertrophy. Left ventricular diastolic Doppler  parameters are consistent with impaired  relaxation.       DM 2 -  Lab Results  Component Value Date   HGBA1C 10.9 (H) 12/13/2021   on insulin,     obesity-   BMI Readings from Last 1 Encounters:  03/04/22 32.61 kg/m     A. Fib -  - CHA2DS2 vas score    5       current  on anticoagulation with  Eliquis,           -  Rate control:  Currently controlled  with  Toprolol,     CKD stage IIIa- baseline Cr 1.1 Estimated Creatinine Clearance: 40.1 mL/min (A) (by C-G formula based on SCr of 1.22 mg/dL (H)).  Lab Results  Component Value Date   CREATININE 1.22 (H) 03/04/2022   CREATININE 1.43 (H) 12/13/2021   CREATININE 1.07 (H) 12/10/2019     While in ER:    Felt to be concerning for hand cellulitis started broadly For on cefepime and vancomycin Given exposure to gardening differential included  sporotrichosis  a pseudomonal infection  Plain imaging of the hand no foreign bodies noted  Following Medications were ordered in ER: Medications  sodium chloride 0.9 % bolus 1,000 mL (has no administration in time range)  vancomycin (VANCOREADY) IVPB 1750 mg/350 mL (has no administration in time range)  ceFEPIme (MAXIPIME) 2 g in sodium chloride 0.9 % 100 mL IVPB (has no administration in time range)    _______________________________________________________ ER Provider Called:   hand surgery  Dr.Spears  They Recommend admit to medicine   Will see in AM     ED Triage Vitals  Enc Vitals Group     BP 03/04/22 1623 (!) 141/72  Pulse Rate 03/04/22 1623 68     Resp 03/04/22 1623 18     Temp 03/04/22 1623 97.6 F (36.4 C)     Temp Source 03/04/22 1623 Oral     SpO2 03/04/22 1623 91 %     Weight 03/04/22 1628 167 lb (75.8 kg)     Height 03/04/22 1628 5' (1.524 m)     Head Circumference --      Peak Flow --      Pain Score 03/04/22 1627 5     Pain Loc --      Pain Edu? --      Excl. in [place]? --   TMAX(24)@     _________________________________________ Significant initial  Findings: Abnormal Labs Reviewed  CBC WITH DIFFERENTIAL/PLATELET - Abnormal; Notable for the following components:      Result Value   WBC 13.8 (*)    MCH 25.5 (*)    RDW 16.6 (*)    Platelets 402 (*)    Neutro Abs 8.6 (*)    Monocytes Absolute 1.1 (*)    Basophils Absolute 0.2 (*)    All other components within normal limits  BASIC METABOLIC PANEL -  Abnormal; Notable for the following components:   Potassium 5.7 (*)    Glucose, Bld 364 (*)    BUN 25 (*)    Creatinine, Ser 1.22 (*)    GFR, Estimated 48 (*)    All other components within normal limits      ed ECG: Ordered Personally reviewed by me showing: HR : 67 Rhythm: Sinus rhythm Low voltage, precordial leads QTC 425   WBC     Component Value Date/Time   WBC 13.8 (H) 03/04/2022 1655   LYMPHSABS 3.7 03/04/2022 1655   MONOABS 1.1 (H) 03/04/2022 1655   EOSABS 0.3 03/04/2022 1655   BASOSABS 0.2 (H) 03/04/2022 1655        Procalcitonin   Ordered      Results for orders placed or performed during the hospital encounter of 11/18/19  Culture, blood (routine x 2)     Status: None   Collection Time: 11/18/19 11:40 AM   Specimen: BLOOD LEFT ARM  Result Value Ref Range Status   Specimen Description BLOOD LEFT ARM  Final   Special Requests   Final    BOTTLES DRAWN AEROBIC ONLY Blood Culture results may not be optimal due to an inadequate volume of blood received in culture bottles   Culture   Final    NO GROWTH 5 DAYS Performed at [facility], [place] [address] [place], [place] [address]    Report Status 11/23/2019 FINAL  Final  Culture, blood (routine x 2)     Status: None   Collection Time: 11/18/19 11:45 AM   Specimen: BLOOD LEFT HAND  Result Value Ref Range Status   Specimen Description BLOOD LEFT HAND  Final   Special Requests   Final    BOTTLES DRAWN AEROBIC ONLY Blood Culture results may not be optimal due to an inadequate volume of blood received in culture bottles   Culture   Final    NO GROWTH 5 DAYS Performed at [facility], [place] [address], [place], [place] [address]    Report Status 11/23/2019 FINAL  Final  SARS CORONAVIRUS 2 (TAT 6-24 HRS) Nasopharyngeal Nasopharyngeal Swab     Status: None   Collection Time: 11/18/19  3:00 PM   Specimen: Nasopharyngeal Swab  Result Value Ref Range Status   SARS Coronavirus  2 NEGATIVE NEGATIVE Final     Comment: (NOTE) SARS-CoV-2 target nucleic acids are NOT DETECTED. The SARS-CoV-2 RNA is generally detectable in upper and lower respiratory specimens during the acute phase of infection. Negative results do not preclude SARS-CoV-2 infection, do not rule out co-infections with other pathogens, and should not be used as the sole basis for treatment or other patient management decisions. Negative results must be combined with clinical observations, patient history, and epidemiological information. The expected result is Negative. Fact Sheet for Patients: SugarRoll.be Fact Sheet for Healthcare Providers: https://www.woods-mathews.com/ This test is not yet approved or cleared by the [place] FDA and  has been authorized for detection and/or diagnosis of SARS-CoV-2 by FDA under an Emergency Use Authorization (EUA). This EUA will remain  in effect (meaning this test can be used) for the duration of the COVID-19 declaration under Section 56 4(b)(1) of the Act, 21 U.S.C. section 360bbb-3(b)(1), unless the authorization is terminated or revoked sooner. Performed at [facility], [place] [address] [place], [place] [address]      _______________________________________________ Hospitalist was called for admission for hand cellulitis   The following Work up has been ordered so far:  Orders Placed This Encounter  Procedures   DG Hand Complete Left   CBC with Differential   Basic metabolic panel   Consult to hospitalist   Consult to hand surgery     OTHER Significant initial  Findings:  labs showing:    Recent Labs  Lab 03/04/22 1655  NA 136  K 5.7*  CO2 29  GLUCOSE 364*  BUN 25*  CREATININE 1.22*  CALCIUM 9.3    Cr   stable,    Lab Results  Component Value Date   CREATININE 1.22 (H) 03/04/2022   CREATININE 1.43 (H) 12/13/2021   CREATININE 1.07 (H) 12/10/2019    No results for input(s): "AST", "ALT", "ALKPHOS",  "BILITOT", "PROT", "ALBUMIN" in the last 168 hours. Lab Results  Component Value Date   CALCIUM 9.3 03/04/2022   PHOS 2.6 05/28/2019          Plt: Lab Results  Component Value Date   PLT 402 (H) 03/04/2022       Recent Labs  Lab 03/04/22 1655  WBC 13.8*  NEUTROABS 8.6*  HGB 12.7  HCT 41.0  MCV 82.2  PLT 402*    HG/HCT  stable,      Component Value Date/Time   HGB 12.7 03/04/2022 1655   HCT 41.0 03/04/2022 1655   MCV 82.2 03/04/2022 1655    DM  labs:  HbA1C: Recent Labs    12/13/21 1440  HGBA1C 10.9*       CBG (last 3)  No results for input(s): "GLUCAP" in the last 72 hours.        Cultures:    Component Value Date/Time   SDES BLOOD LEFT HAND 11/18/2019 1145   SPECREQUEST  11/18/2019 1145    BOTTLES DRAWN AEROBIC ONLY Blood Culture results may not be optimal due to an inadequate volume of blood received in culture bottles   CULT  11/18/2019 1145    NO GROWTH 5 DAYS Performed at [facility], [place] [address] [place], [place] [address]    REPTSTATUS 11/23/2019 FINAL 11/18/2019 1145     Radiological Exams on Admission: DG Hand Complete Left  Result Date: 03/04/2022 CLINICAL DATA:  Trauma, pain and swelling EXAM: LEFT HAND - COMPLETE 3+ VIEW COMPARISON:  None Available. FINDINGS: No fracture or dislocation is seen. There  are no radiopaque foreign bodies. Small cystic foci seen in the lunate may be due to degenerative arthritis. IMPRESSION: No fracture or dislocation is seen in the left hand. There are no radiopaque foreign bodies. Electronically Signed   By: Elmer Picker M.D.   On: 03/04/2022 16:47   _______________________________________________________________________________________________________ Latest  Blood pressure (!) 115/101, pulse 66, temperature 98.7 F (37.1 C), resp. rate 18, height 5' (1.524 m), weight 75.8 kg, SpO2 93 %.   Vitals  labs and radiology finding personally reviewed  Review of Systems:    Pertinent positives  include:  chills, fatigue, hand swelling  Constitutional:  No weight loss, night sweats, Fevers,  weight loss  HEENT:  No headaches, Difficulty swallowing,Tooth/dental problems,Sore throat,  No sneezing, itching, ear ache, nasal congestion, post nasal drip,  Cardio-vascular:  No chest pain, Orthopnea, PND, anasarca, dizziness, palpitations.no Bilateral lower extremity swelling  GI:  No heartburn, indigestion, abdominal pain, nausea, vomiting, diarrhea, change in bowel habits, loss of appetite, melena, blood in stool, hematemesis Resp:  no shortness of breath at rest. No dyspnea on exertion, No excess mucus, no productive cough, No non-productive cough, No coughing up of blood.No change in color of mucus.No wheezing. Skin:  no rash or lesions. No jaundice GU:  no dysuria, change in color of urine, no urgency or frequency. No straining to urinate.  No flank pain.  Musculoskeletal:  No joint pain or no joint swelling. No decreased range of motion. No back pain.  Psych:  No change in mood or affect. No depression or anxiety. No memory loss.  Neuro: no localizing neurological complaints, no tingling, no weakness, no double vision, no gait abnormality, no slurred speech, no confusion  All systems reviewed and apart from [place] all are negative _______________________________________________________________________________________________ Past Medical History:   Past Medical History:  Diagnosis Date   Anxiety    Arthritis    Cancer ([place]) 1977   CERVICAL   CHF (congestive heart failure) ([place])    pt. denies   Diabetes mellitus without complication ([place])    type 2   Dysrhythmia    Afib   GERD (gastroesophageal reflux disease) 11/14/2013   Gout    Hypertension    Mixed hyperlipidemia 11/14/2013   Neuromuscular disorder (HCC)    neuropathy feet   Pneumonia       Past Surgical History:  Procedure Laterality Date   ABDOMINAL HYSTERECTOMY  1977   CERVICAL CA   CHOLECYSTECTOMY      EYE SURGERY     6/3 LASER FOR GLAUCOMA, 9/03 CE/IOL IMPLANTS   INGUINAL LYMPH NODE BIOPSY Right 06/08/2018   Procedure: RIGHT INGUINAL LYMPH NODE EXCISIONAL BIOPSY;  Surgeon: Clovis Riley, MD;  Location: [facility];  Service: General;  Laterality: Right;   LAPAROSCOPIC APPENDECTOMY N/A 12/17/2021   Procedure: LAPAROSCOPIC APPENDECTOMY;  Surgeon: Clovis Riley, MD;  Location: [facility];  Service: General;  Laterality: N/A;   pancreatectomy     SHOULDER CLOSED REDUCTION Left 02/03/2013   Procedure: CLOSED MANIPULATION SHOULDER;  Surgeon: Vickey Huger, MD;  Location: [facility];  Service: Orthopedics;  Laterality: Left;  CLOSED MANIPULATION LEFT SHOULDER   SPLENECTOMY     TONSILLECTOMY  1970    Social History:  Ambulatory   independently      reports that she quit smoking about 32 years ago. Her smoking use included cigarettes. She has never used smokeless tobacco. She reports that she does not drink alcohol and does not use drugs.   Family History:   Family  History  Problem Relation Age of Onset   Dementia Mother    Heart disease Mother    Hypertension Mother    COPD Mother    Breast cancer Mother 42   Heart disease Father    Diabetes Father    Glaucoma Father    Cancer Father        BLADDER   Depression Sister    ______________________________________________________________________________________________ Allergies: Allergies  Allergen Reactions   Atorvastatin     Hands numb and tingly    Ciprofloxacin Diarrhea   Doxycycline Hyclate Nausea And Vomiting   Gabapentin Other (See Comments)    Caused upset stomach   Lisinopril     Unknown reaction    Oxycodone Nausea And Vomiting    Tolerates hydrocodone    Penicillins Hives and Other (See Comments)    Has patient had a PCN reaction causing immediate rash, facial/tongue/throat swelling, SOB or lightheadedness with hypotension: Unknown Has patient had a PCN reaction causing severe rash involving mucus membranes or skin  necrosis: Unknown Has patient had a PCN reaction that required hospitalization: Unknown Has patient had a PCN reaction occurring within the last 10 years: No If all of the above answers are "NO", then may proceed with Cephalosporin use.    Pepcid [Famotidine] Other (See Comments)    HEADACHE     Prior to Admission medications   Medication Sig Start Date End Date Taking? Authorizing Provider  amLODipine (NORVASC) 5 MG tablet Take 5 mg by mouth daily. 12/15/20   [provider]  apixaban (ELIQUIS) 5 MG TABS tablet Take 1 tablet (5 mg total) by mouth 2 (two) times daily. 05/17/19   Dixie Dials, MD  BD PEN NEEDLE NANO 2ND GEN 32G X 4 MM MISC  12/08/20   [provider]  Blood Glucose Monitoring Suppl (ONETOUCH VERIO) w/Device KIT 1 kit by Does not apply route 3 (three) times daily. 12/24/16   Unk Pinto, MD  cetirizine (ZYRTEC) 10 MG tablet Take 10 mg by mouth daily as needed for allergies.    [provider]  Cholecalciferol (VITAMIN D-3) 5000 units TABS Take 5,000 tablets by mouth every evening.    [provider]  clonazePAM (KLONOPIN) 1 MG tablet Take 0.5 mg by mouth daily as needed for anxiety.  05/27/18   [provider]  diclofenac Sodium (VOLTAREN) 1 % GEL Apply 1 application. topically 4 (four) times daily as needed (pain).    [provider]  estradiol (ESTRACE) 0.1 MG/GM vaginal cream Place 1 Applicatorful vaginally 2 (two) times a week.    [provider]  FLUoxetine (PROZAC) 20 MG capsule Take 20 mg by mouth daily.    [provider]  glucose blood (ONETOUCH VERIO) test strip CHECK BLOOD SUGAR 3 TIMES DAILY-DX-E11.9 AND Z79.4. 12/24/16   Unk Pinto, MD  hydrochlorothiazide (HYDRODIURIL) 25 MG tablet Take 25 mg by mouth daily. 09/08/20   [provider]  insulin aspart (NOVOLOG FLEXPEN) 100 UNIT/ML FlexPen Inject 3-5 Units into the skin See admin instructions. 3 units at breakfast, 3 units at lunch,  and 5 units at evening meal 12/08/20   [provider]  insulin degludec (TRESIBA FLEXTOUCH) 100 UNIT/ML FlexTouch Pen Inject 10-12 Units into the skin daily. 12/08/20   [provider]  Loperamide-Simethicone 2-125 MG TABS Take 1 tablet by mouth daily as needed (diarrhea).    [provider]  losartan (COZAAR) 50 MG tablet Take 1 tablet (50 mg total) by mouth daily. Patient taking differently: Take  50 mg by mouth 2 (two) times daily. 09/02/19   Eugenie Filler, MD  metoprolol succinate (TOPROL-XL) 25 MG 24 hr tablet Take 1 tablet (25 mg total) by mouth daily. Patient taking differently: Take 25 mg by mouth 2 (two) times daily. 05/18/19   Dixie Dials, MD  mupirocin ointment (BACTROBAN) 2 % APPLY TOPICALLY TWICE A DAY Patient not taking: Reported on 12/11/2021 11/27/21   Trula Slade, DPM  omeprazole (PRILOSEC) 20 MG capsule Take 20 mg by mouth daily.    [provider]  ondansetron (ZOFRAN) 4 MG tablet Take 1 tablet (4 mg total) by mouth every 8 (eight) hours as needed for nausea or vomiting. 12/17/21   Clovis Riley, MD  [facility] DELICA LANCETS 16X MISC Check blood sugar 3 times daily-DX-E11.9 and Z79.4. 01/23/17   Unk Pinto, MD  Polyethyl Glycol-Propyl Glycol (SYSTANE) 0.4-0.3 % SOLN Place 1 drop into both eyes 2 (two) times daily.    [provider]  pregabalin (LYRICA) 150 MG capsule TAKE ONE CAPSULE BY MOUTH AT BEDTIME 01/28/22   Trula Slade, DPM  traMADol (ULTRAM) 50 MG tablet Take 50 mg by mouth every 6 (six) hours as needed for moderate pain. 05/15/20   [provider]  triamcinolone (NASACORT) 55 MCG/ACT AERO nasal inhaler Place 1 spray into the nose daily as needed (allergies).    [provider]    ___________________________________________________________________________________________________ Physical Exam:    03/04/2022   11:45 PM 03/04/2022   10:46 PM 03/04/2022    4:28 PM  Vitals with BMI  Height    _0   Weight   167 lbs  BMI   09.60  Systolic 454 098   Diastolic 119 73   Pulse 66 65      1. General:  in No  Acute distress    Chronically ill  -appearing 2. Psychological: Alert and  Oriented 3. Head/ENT:    Dry Mucous Membranes                          Head Non traumatic, neck supple                          Normal   Dentition 4. SKIN:  decreased Skin turgor,  Skin clean Dry  Left hand swelling red     5. Heart: Regular rate and rhythm no  Murmur, no Rub or gallop 6. Lungs:   no wheezes or crackles   7. Abdomen: Soft,  non-tender, Non distended   obese  bowel sounds present 8. Lower extremities: no clubbing, cyanosis, no  edema 9. Neurologically Grossly intact, moving all 4 extremities equally   10. MSK: Normal range of motion    Chart has been reviewed  ______________________________________________________________________________________________  Assessment/Plan 66 y.o. female with medical history significant of diabetes atrial fibrillation history of CHF GERD gout hypertension HLD neuropathy, sp splenectomy   Admitted for left hand cellulitis   Present on Admission:  Cellulitis of hand, left  Essential hypertension  GERD (gastroesophageal reflux disease)  Hyperkalemia  Chronic diastolic heart failure (HCC)  Paroxysmal atrial fibrillation (HCC)     Cellulitis of hand, left Continue broad-spectrum antibiotics including cefepime and vancomycin per pharmacy consult Hand surgery is aware ER will attempt I&D at bedside For tonight keep n.p.o. in case she needs any surgical intervention down the road  DM (diabetes mellitus), type 2 ([place])  - Order Sensitive  SSI   -  continue home insulin regimen    Lantus 10 units,  -  check TSH and HgA1C  - Hold by mouth medications     Essential hypertension Given elevated potassium hold Cozaar for tonight Hold hydrochlorothiazide.  Resume Norvasc 5 mg daily when blood pressure allows.  Resume Toprol to 25 mg twice  a day  GERD (gastroesophageal reflux disease) Order Protonix 40 p.o. daily  Hyperkalemia Obtain EKG monitor on telemetry hold Cozaar repeat be met after IV fluids to see if improved if not for any treatment  Chronic diastolic heart failure (HCC) Stable appears to be euvolemic actually low but on dry side we will gently rehydrate    Other plan as per orders.  DVT prophylaxis:  SCD       Code Status:    Code Status: Prior FULL CODE  as per patient   I had personally discussed CODE STATUS with patient     Family Communication:   Family not at  Bedside    Disposition Plan:       To home once workup is complete and patient is stable   Following barriers for discharge:                            Electrolytes corrected                                                             Pain controlled with PO medications                                white count improving able to transition to PO antibiotics                             Will need to be able to tolerate PO                                                        Will need consultants to evaluate patient prior to discharge                        Consults called:    hand surgery is aware will see in a.m.  Admission status:  ED Disposition     ED Disposition  Admit   Condition  --   Comment  Hospital Area: [facility] [[id number]]  Level of Care: Telemetry [5]  Admit to tele based on following criteria: Other see comments  Comments: hyperkalemia  May admit patient to [facility] or [facility] if equivalent level of care is available:: No  Covid Evaluation: Asymptomatic - no recent exposure (last 10 days) testing not required  Diagnosis: Cellulitis of hand, left [[id number]]  Admitting Physician: Toy Baker [[id number]]  Attending Physician: Toy Baker [[id number]]  Estimated length of stay: past midnight tomorrow  Certification:: I certify this patient will need inpatient services for at least 2  midnights  inpatient     I Expect 2 midnight stay secondary to severity of patient's current illness need for inpatient interventions justified by the following:    Severe lab/radiological/exam abnormalities including:    Left hand cellulitis and extensive comorbidities including:  DM2   CHF * CAD  Obesity  CKD History of splenectomy Chronic anticoagulation  That are currently affecting medical management.   I expect  patient to be hospitalized for 2 midnights requiring inpatient medical care.  Patient is at high risk for adverse outcome (such as loss of life or disability) if not treated.  Indication for inpatient stay as follows:    severe pain requiring acute inpatient management,    Need for operative/procedural  intervention      Need for IV antibiotics, IV fluids,     Level of care     tele  For  24H      [name] 03/05/2022, 1:30 AM    [facility]     after 2 AM please page floor coverage PA If 7AM-7PM, please contact the day team taking care of the patient using [email or web address]   Patient was evaluated in the context of the global COVID-19 pandemic, which necessitated consideration that the patient might be at risk for infection with the SARS-CoV-2 virus that causes COVID-19. Institutional protocols and algorithms that pertain to the evaluation of patients at risk for COVID-19 are in a state of rapid change based on information released by regulatory bodies including the CDC and federal and state organizations. These policies and algorithms were followed during the patient's care.

## 2022-03-04 NOTE — ED Provider Triage Note (Signed)
Emergency Medicine Provider Triage Evaluation Note  [name] , a 66 y.o. female  was evaluated in triage.  Pt complains of left hand swelling.  Was gardening and bruises about a week ago.  Had puncture wound over first metacarpal palmar aspect.  Was seen by PCP started on antibiotics, Keflex 4 times daily.  She has had progressive swelling to her second digit.  Unable to fully straighten.  Held in a flexed position.  Moderate tenderness over flexor tendon.  She has diffuse erythema at metacarpal extending dorsally onto hand.  She does not follow with hand surgery.  She has had chills without documented fever.  Review of Systems  Positive: Swelling, pain Negative:   Physical Exam  BP (!) 141/72 (BP Location: Left Arm)   Pulse 68   Temp 97.6 F (36.4 C) (Oral)   Resp 18   Ht 5' (1.524 m)   Wt 75.8 kg   SpO2 91%   BMI 32.61 kg/m  Gen:   Awake, no distress   Resp:  Normal effort  MSK:   Moderate tenderness over left second digit flexor tendon.  Held in a flexed position.  Unable to extend.  She has diffuse erythema, swelling surrounding the metacarpal.  Erythema extending up dorsum of hand Other:    Medical Decision Making  Medically screening exam initiated at 4:29 PM.  Appropriate orders placed.  ARTHELIA CALLICOTT was informed that the remainder of the evaluation will be completed by another provider, this initial triage assessment does not replace that evaluation, and the importance of remaining in the ED until their evaluation is complete.  Hand infection, concern for flexor tenosynovitis, will get labs, imaging   Monti Villers A, PA-C 03/04/22 1630

## 2022-03-04 NOTE — ED Triage Notes (Signed)
Patient reports puncturing her left hand with a rose bush last week. Was seen at her PCP and started on oral antibiotics. Swelling and redness has continued.

## 2022-03-04 NOTE — Subjective & Objective (Signed)
Patient reports she punctured her hand on a rose bush last week was seen by primary care provider started on oral antibiotics but continued to have swelling and redness of half hand She has been taking Keflex 4 times a day but continues to still swell.  Cannot even straighten out her finger.  Started to extend erythema into her dorsal of the hand.  Chills but no fever

## 2022-03-05 ENCOUNTER — Inpatient Hospital Stay (HOSPITAL_COMMUNITY): Payer: Medicare Other | Admitting: Certified Registered Nurse Anesthetist

## 2022-03-05 ENCOUNTER — Encounter (HOSPITAL_COMMUNITY)
Admission: EM | Disposition: A | Discharge status: Home / Self Care | Payer: Self-pay | Source: Home / Self Care | Attending: Internal Medicine

## 2022-03-05 ENCOUNTER — Encounter (HOSPITAL_COMMUNITY): Payer: Self-pay | Admitting: Internal Medicine

## 2022-03-05 ENCOUNTER — Other Ambulatory Visit: Payer: Self-pay

## 2022-03-05 DIAGNOSIS — E1165 Type 2 diabetes mellitus with hyperglycemia: Secondary | ICD-10-CM | POA: Diagnosis present

## 2022-03-05 DIAGNOSIS — Z8541 Personal history of malignant neoplasm of cervix uteri: Secondary | ICD-10-CM | POA: Diagnosis not present

## 2022-03-05 DIAGNOSIS — Z8249 Family history of ischemic heart disease and other diseases of the circulatory system: Secondary | ICD-10-CM | POA: Diagnosis not present

## 2022-03-05 DIAGNOSIS — Z87891 Personal history of nicotine dependence: Secondary | ICD-10-CM | POA: Diagnosis not present

## 2022-03-05 DIAGNOSIS — Z833 Family history of diabetes mellitus: Secondary | ICD-10-CM | POA: Diagnosis not present

## 2022-03-05 DIAGNOSIS — F418 Other specified anxiety disorders: Secondary | ICD-10-CM

## 2022-03-05 DIAGNOSIS — L03114 Cellulitis of left upper limb: Secondary | ICD-10-CM | POA: Diagnosis present

## 2022-03-05 DIAGNOSIS — I480 Paroxysmal atrial fibrillation: Secondary | ICD-10-CM | POA: Diagnosis present

## 2022-03-05 DIAGNOSIS — I5032 Chronic diastolic (congestive) heart failure: Secondary | ICD-10-CM

## 2022-03-05 DIAGNOSIS — X58XXXA Exposure to other specified factors, initial encounter: Secondary | ICD-10-CM | POA: Diagnosis present

## 2022-03-05 DIAGNOSIS — E875 Hyperkalemia: Secondary | ICD-10-CM | POA: Diagnosis present

## 2022-03-05 DIAGNOSIS — Z6832 Body mass index (BMI) 32.0-32.9, adult: Secondary | ICD-10-CM | POA: Diagnosis not present

## 2022-03-05 DIAGNOSIS — E1122 Type 2 diabetes mellitus with diabetic chronic kidney disease: Secondary | ICD-10-CM | POA: Diagnosis present

## 2022-03-05 DIAGNOSIS — M65042 Abscess of tendon sheath, left hand: Secondary | ICD-10-CM

## 2022-03-05 DIAGNOSIS — M659 Synovitis and tenosynovitis, unspecified: Secondary | ICD-10-CM | POA: Diagnosis present

## 2022-03-05 DIAGNOSIS — I110 Hypertensive heart disease with heart failure: Secondary | ICD-10-CM | POA: Diagnosis not present

## 2022-03-05 DIAGNOSIS — I130 Hypertensive heart and chronic kidney disease with heart failure and stage 1 through stage 4 chronic kidney disease, or unspecified chronic kidney disease: Secondary | ICD-10-CM | POA: Diagnosis present

## 2022-03-05 DIAGNOSIS — M7989 Other specified soft tissue disorders: Secondary | ICD-10-CM | POA: Diagnosis present

## 2022-03-05 DIAGNOSIS — F419 Anxiety disorder, unspecified: Secondary | ICD-10-CM | POA: Diagnosis present

## 2022-03-05 DIAGNOSIS — S61432A Puncture wound without foreign body of left hand, initial encounter: Secondary | ICD-10-CM | POA: Diagnosis present

## 2022-03-05 DIAGNOSIS — I2510 Atherosclerotic heart disease of native coronary artery without angina pectoris: Secondary | ICD-10-CM | POA: Diagnosis present

## 2022-03-05 DIAGNOSIS — E669 Obesity, unspecified: Secondary | ICD-10-CM | POA: Diagnosis present

## 2022-03-05 DIAGNOSIS — L02512 Cutaneous abscess of left hand: Secondary | ICD-10-CM | POA: Diagnosis present

## 2022-03-05 DIAGNOSIS — E782 Mixed hyperlipidemia: Secondary | ICD-10-CM | POA: Diagnosis present

## 2022-03-05 DIAGNOSIS — Z9081 Acquired absence of spleen: Secondary | ICD-10-CM | POA: Diagnosis not present

## 2022-03-05 DIAGNOSIS — E1140 Type 2 diabetes mellitus with diabetic neuropathy, unspecified: Secondary | ICD-10-CM | POA: Diagnosis present

## 2022-03-05 DIAGNOSIS — K219 Gastro-esophageal reflux disease without esophagitis: Secondary | ICD-10-CM | POA: Diagnosis present

## 2022-03-05 DIAGNOSIS — Z90710 Acquired absence of both cervix and uterus: Secondary | ICD-10-CM | POA: Diagnosis not present

## 2022-03-05 DIAGNOSIS — N1831 Chronic kidney disease, stage 3a: Secondary | ICD-10-CM | POA: Diagnosis present

## 2022-03-05 HISTORY — PX: I & D EXTREMITY: SHX5045

## 2022-03-05 LAB — COMPREHENSIVE METABOLIC PANEL
ALT: 15 U/L (ref 0–44)
AST: 21 U/L (ref 15–41)
Albumin: 3 g/dL — ABNORMAL LOW (ref 3.5–5.0)
Alkaline Phosphatase: 119 U/L (ref 38–126)
Anion gap: 7 (ref 5–15)
BUN: 19 mg/dL (ref 8–23)
CO2: 25 mmol/L (ref 22–32)
Calcium: 8.5 mg/dL — ABNORMAL LOW (ref 8.9–10.3)
Chloride: 107 mmol/L (ref 98–111)
Creatinine, Ser: 0.99 mg/dL (ref 0.44–1.00)
GFR, Estimated: >60 mL/min (ref >60)
Glucose, Bld: 204 mg/dL — ABNORMAL HIGH (ref 70–99)
Potassium: 4.4 mmol/L (ref 3.5–5.1)
Sodium: 139 mmol/L (ref 135–145)
Total Bilirubin: 0.5 mg/dL (ref 0.3–1.2)
Total Protein: 6.6 g/dL (ref 6.5–8.1)

## 2022-03-05 LAB — GLUCOSE, CAPILLARY
Glucose-Capillary: 175 mg/dL — ABNORMAL HIGH (ref 70–99)
Glucose-Capillary: 144 mg/dL — ABNORMAL HIGH (ref 70–99)
Glucose-Capillary: 162 mg/dL — ABNORMAL HIGH (ref 70–99)

## 2022-03-05 LAB — HEPATIC FUNCTION PANEL
ALT: 14 U/L (ref 0–44)
AST: 21 U/L (ref 15–41)
Albumin: 2.9 g/dL — ABNORMAL LOW (ref 3.5–5.0)
Alkaline Phosphatase: 119 U/L (ref 38–126)
Bilirubin, Direct: 0.1 mg/dL (ref 0.0–0.2)
Indirect Bilirubin: 0.5 mg/dL (ref 0.3–0.9)
Total Bilirubin: 0.6 mg/dL (ref 0.3–1.2)
Total Protein: 6.5 g/dL (ref 6.5–8.1)

## 2022-03-05 LAB — CBG MONITORING, ED
Glucose-Capillary: 275 mg/dL — ABNORMAL HIGH (ref 70–99)
Glucose-Capillary: 191 mg/dL — ABNORMAL HIGH (ref 70–99)
Glucose-Capillary: 143 mg/dL — ABNORMAL HIGH (ref 70–99)
Glucose-Capillary: 160 mg/dL — ABNORMAL HIGH (ref 70–99)

## 2022-03-05 LAB — CBC
HCT: 36.6 % (ref 36.0–46.0)
Hemoglobin: 11.2 g/dL — ABNORMAL LOW (ref 12.0–15.0)
MCH: 25 pg — ABNORMAL LOW (ref 26.0–34.0)
MCHC: 30.6 g/dL (ref 30.0–36.0)
MCV: 81.7 fL (ref 80.0–100.0)
Platelets: 344 10*3/uL (ref 150–400)
RBC: 4.48 MIL/uL (ref 3.87–5.11)
RDW: 16.5 % — ABNORMAL HIGH (ref 11.5–15.5)
WBC: 16.9 10*3/uL — ABNORMAL HIGH (ref 4.0–10.5)
nRBC: 0 % (ref 0.0–0.2)

## 2022-03-05 LAB — HIV ANTIBODY (ROUTINE TESTING W REFLEX): HIV Screen 4th Generation wRfx: NONREACTIVE

## 2022-03-05 LAB — CK: Total CK: 88 U/L (ref 38–234)

## 2022-03-05 LAB — MAGNESIUM: Magnesium: 1.9 mg/dL (ref 1.7–2.4)

## 2022-03-05 LAB — PHOSPHORUS: Phosphorus: 2.7 mg/dL (ref 2.5–4.6)

## 2022-03-05 LAB — PROCALCITONIN: Procalcitonin: <0.1 ng/mL

## 2022-03-05 LAB — HEMOGLOBIN A1C
Hgb A1c MFr Bld: 9.8 % — ABNORMAL HIGH (ref 4.8–5.6)
Mean Plasma Glucose: 234.56 mg/dL

## 2022-03-05 LAB — PREALBUMIN: Prealbumin: 12.7 mg/dL — ABNORMAL LOW (ref 18–38)

## 2022-03-05 SURGERY — IRRIGATION AND DEBRIDEMENT EXTREMITY
Anesthesia: General | Laterality: Left

## 2022-03-05 MED ORDER — ACETAMINOPHEN 325 MG PO TABS: 650.0000 mg | ORAL_TABLET | Freq: Four times a day (QID) | ORAL | Status: DC | PRN

## 2022-03-05 MED ORDER — BACITRACIN ZINC 500 UNIT/GM EX OINT
TOPICAL_OINTMENT | CUTANEOUS | Status: AC
  Filled 2022-03-05: qty 28.35

## 2022-03-05 MED ORDER — LIDOCAINE HCL (PF) 1 % IJ SOLN
INTRAMUSCULAR | Status: DC | PRN
  Administered 2022-03-05: 5 mL

## 2022-03-05 MED ORDER — PROPOFOL 1000 MG/100ML IV EMUL
INTRAVENOUS | Status: AC
  Filled 2022-03-05: qty 100

## 2022-03-05 MED ORDER — HYDROCODONE-ACETAMINOPHEN 5-325 MG PO TABS
1.0000 | ORAL_TABLET | ORAL | Status: DC | PRN
  Administered 2022-03-05 – 2022-03-07 (×7): 2 via ORAL
  Filled 2022-03-05 (×7): qty 2

## 2022-03-05 MED ORDER — PROPOFOL 10 MG/ML IV BOLUS
INTRAVENOUS | Status: AC
Start: 2022-03-05 — End: ?
  Filled 2022-03-05: qty 20

## 2022-03-05 MED ORDER — ONDANSETRON HCL 4 MG/2ML IJ SOLN: 4.0000 mg | Freq: Once | INTRAMUSCULAR | Status: DC | PRN

## 2022-03-05 MED ORDER — CEFEPIME HCL 2 G IV SOLR
2.0000 g | Freq: Two times a day (BID) | INTRAVENOUS | Status: DC
  Administered 2022-03-05 – 2022-03-06 (×4): 2 g via INTRAVENOUS
  Filled 2022-03-05 (×5): qty 12.5

## 2022-03-05 MED ORDER — MIDAZOLAM HCL 5 MG/5ML IJ SOLN
INTRAMUSCULAR | Status: DC | PRN
  Administered 2022-03-05: 2 mg via INTRAVENOUS

## 2022-03-05 MED ORDER — INSULIN GLARGINE-YFGN 100 UNIT/ML ~~LOC~~ SOLN: 10.0000 [IU] | Freq: Every day | SUBCUTANEOUS | Status: DC

## 2022-03-05 MED ORDER — SODIUM CHLORIDE 0.9 % IV SOLN: INTRAVENOUS | Status: AC

## 2022-03-05 MED ORDER — 0.9 % SODIUM CHLORIDE (POUR BTL) OPTIME
TOPICAL | Status: DC | PRN
  Administered 2022-03-05: 1000 mL

## 2022-03-05 MED ORDER — BUPIVACAINE HCL (PF) 0.5 % IJ SOLN
INTRAMUSCULAR | Status: DC | PRN
  Administered 2022-03-05: 10 mL

## 2022-03-05 MED ORDER — VANCOMYCIN HCL 1250 MG/250ML IV SOLN: 1250.0000 mg | INTRAVENOUS | Status: DC

## 2022-03-05 MED ORDER — VANCOMYCIN HCL 750 MG/150ML IV SOLN
750.0000 mg | INTRAVENOUS | Status: DC
Start: 2022-03-05 — End: 2022-03-06
  Administered 2022-03-05: 750 mg via INTRAVENOUS
  Filled 2022-03-05: qty 150

## 2022-03-05 MED ORDER — CLONAZEPAM 0.5 MG PO TABS
0.5000 mg | ORAL_TABLET | Freq: Every day | ORAL | Status: DC | PRN
  Administered 2022-03-05: 0.5 mg via ORAL
  Filled 2022-03-05: qty 1

## 2022-03-05 MED ORDER — TRAMADOL HCL 50 MG PO TABS: 50.0000 mg | ORAL_TABLET | Freq: Every day | ORAL | Status: DC | PRN

## 2022-03-05 MED ORDER — ONDANSETRON HCL 4 MG/2ML IJ SOLN
INTRAMUSCULAR | Status: DC | PRN
  Administered 2022-03-05: 4 mg via INTRAVENOUS

## 2022-03-05 MED ORDER — ACETAMINOPHEN 650 MG RE SUPP: 650.0000 mg | Freq: Four times a day (QID) | RECTAL | Status: DC | PRN

## 2022-03-05 MED ORDER — LACTATED RINGERS IV SOLN: INTRAVENOUS | Status: DC

## 2022-03-05 MED ORDER — PROPOFOL 10 MG/ML IV BOLUS
INTRAVENOUS | Status: DC | PRN
  Administered 2022-03-05: 10 mg via INTRAVENOUS

## 2022-03-05 MED ORDER — ALBUTEROL SULFATE (2.5 MG/3ML) 0.083% IN NEBU: 2.5000 mg | INHALATION_SOLUTION | RESPIRATORY_TRACT | Status: DC | PRN

## 2022-03-05 MED ORDER — BUPIVACAINE HCL (PF) 0.5 % IJ SOLN
INTRAMUSCULAR | Status: AC
  Filled 2022-03-05: qty 30

## 2022-03-05 MED ORDER — FENTANYL CITRATE (PF) 100 MCG/2ML IJ SOLN
INTRAMUSCULAR | Status: DC | PRN
Start: 2022-03-05 — End: 2022-03-05
  Administered 2022-03-05: 25 ug via INTRAVENOUS
  Administered 2022-03-05: 50 ug via INTRAVENOUS
  Administered 2022-03-05: 25 ug via INTRAVENOUS

## 2022-03-05 MED ORDER — PANTOPRAZOLE SODIUM 40 MG PO TBEC
40.0000 mg | DELAYED_RELEASE_TABLET | Freq: Every day | ORAL | Status: DC
  Administered 2022-03-05 – 2022-03-07 (×3): 40 mg via ORAL
  Filled 2022-03-05 (×3): qty 1

## 2022-03-05 MED ORDER — PREGABALIN 75 MG PO CAPS
150.0000 mg | ORAL_CAPSULE | Freq: Every day | ORAL | Status: DC
  Administered 2022-03-05 – 2022-03-07 (×3): 150 mg via ORAL
  Filled 2022-03-05: qty 2
  Filled 2022-03-05: qty 3
  Filled 2022-03-05: qty 2

## 2022-03-05 MED ORDER — FENTANYL CITRATE PF 50 MCG/ML IJ SOSY: 25.0000 ug | PREFILLED_SYRINGE | INTRAMUSCULAR | Status: DC | PRN

## 2022-03-05 MED ORDER — ONDANSETRON HCL 4 MG/2ML IJ SOLN
INTRAMUSCULAR | Status: AC
  Filled 2022-03-05: qty 2

## 2022-03-05 MED ORDER — FENTANYL CITRATE PF 50 MCG/ML IJ SOSY
12.5000 ug | PREFILLED_SYRINGE | INTRAMUSCULAR | Status: DC | PRN
  Administered 2022-03-05: 50 ug via INTRAVENOUS
  Filled 2022-03-05: qty 1

## 2022-03-05 MED ORDER — FLUOXETINE HCL 20 MG PO CAPS
20.0000 mg | ORAL_CAPSULE | Freq: Every day | ORAL | Status: DC
  Administered 2022-03-05 – 2022-03-07 (×3): 20 mg via ORAL
  Filled 2022-03-05 (×3): qty 1

## 2022-03-05 MED ORDER — LIDOCAINE HCL (PF) 1 % IJ SOLN
5.0000 mL | Freq: Once | INTRAMUSCULAR | Status: AC
  Administered 2022-03-05: 5 mL
  Filled 2022-03-05: qty 30

## 2022-03-05 MED ORDER — CHLORHEXIDINE GLUCONATE 0.12 % MT SOLN
15.0000 mL | Freq: Once | OROMUCOSAL | Status: AC
Start: 2022-03-05 — End: 2022-03-05
  Administered 2022-03-05: 15 mL via OROMUCOSAL

## 2022-03-05 MED ORDER — INSULIN ASPART 100 UNIT/ML IJ SOLN
0.0000 [IU] | INTRAMUSCULAR | Status: DC
  Administered 2022-03-05: 2 [IU] via SUBCUTANEOUS
  Administered 2022-03-05: 5 [IU] via SUBCUTANEOUS
  Administered 2022-03-05 (×2): 2 [IU] via SUBCUTANEOUS
  Administered 2022-03-05 (×2): 1 [IU] via SUBCUTANEOUS
  Administered 2022-03-06: 3 [IU] via SUBCUTANEOUS
  Administered 2022-03-06: 9 [IU] via SUBCUTANEOUS
  Administered 2022-03-06: 2 [IU] via SUBCUTANEOUS
  Administered 2022-03-06: 3 [IU] via SUBCUTANEOUS
  Administered 2022-03-07 (×3): 1 [IU] via SUBCUTANEOUS
  Administered 2022-03-07: 5 [IU] via SUBCUTANEOUS
  Filled 2022-03-05: qty 0.09

## 2022-03-05 MED ORDER — LIDOCAINE HCL (PF) 1 % IJ SOLN
INTRAMUSCULAR | Status: AC
  Filled 2022-03-05: qty 30

## 2022-03-05 MED ORDER — INSULIN GLARGINE-YFGN 100 UNIT/ML ~~LOC~~ SOLN
10.0000 [IU] | Freq: Every day | SUBCUTANEOUS | Status: DC
  Administered 2022-03-05 (×2): 10 [IU] via SUBCUTANEOUS
  Filled 2022-03-05 (×4): qty 0.1

## 2022-03-05 MED ORDER — METOPROLOL SUCCINATE ER 25 MG PO TB24
25.0000 mg | ORAL_TABLET | Freq: Two times a day (BID) | ORAL | Status: DC
  Administered 2022-03-05 – 2022-03-07 (×5): 25 mg via ORAL
  Filled 2022-03-05 (×5): qty 1

## 2022-03-05 MED ORDER — LORAZEPAM 2 MG/ML IJ SOLN
1.0000 mg | Freq: Once | INTRAMUSCULAR | Status: AC
  Administered 2022-03-05: 1 mg via INTRAVENOUS
  Filled 2022-03-05: qty 1

## 2022-03-05 MED ORDER — AMLODIPINE BESYLATE 5 MG PO TABS
5.0000 mg | ORAL_TABLET | Freq: Every day | ORAL | Status: DC
  Administered 2022-03-05 – 2022-03-07 (×3): 5 mg via ORAL
  Filled 2022-03-05 (×3): qty 1

## 2022-03-05 MED ORDER — MIDAZOLAM HCL 2 MG/2ML IJ SOLN
INTRAMUSCULAR | Status: AC
  Filled 2022-03-05: qty 2

## 2022-03-05 MED ORDER — LIDOCAINE 2% (20 MG/ML) 5 ML SYRINGE
INTRAMUSCULAR | Status: DC | PRN
  Administered 2022-03-05: 60 mg via INTRAVENOUS

## 2022-03-05 MED ORDER — FENTANYL CITRATE (PF) 100 MCG/2ML IJ SOLN
INTRAMUSCULAR | Status: AC
  Filled 2022-03-05: qty 2

## 2022-03-05 SURGICAL SUPPLY — 41 items
BNDG CMPR 9X4 STRL LF SNTH (GAUZE/BANDAGES/DRESSINGS) ×1
BNDG CONFORM 3 STRL LF (GAUZE/BANDAGES/DRESSINGS) ×1 IMPLANT
BNDG ELASTIC 4X5.8 VLCR STR LF (GAUZE/BANDAGES/DRESSINGS) ×3 IMPLANT
BNDG ESMARK 4X9 LF (GAUZE/BANDAGES/DRESSINGS) ×1 IMPLANT
CORD BIPOLAR FORCEPS 12FT (ELECTRODE) IMPLANT
COVER BACK TABLE 60X90IN (DRAPES) ×2 IMPLANT
CUFF TOURN SGL QUICK 18X4 (TOURNIQUET CUFF) ×1 IMPLANT
DRAPE EXTREMITY T 121X128X90 (DISPOSABLE) IMPLANT
DRAPE ORTHO SPLIT 77X108 STRL (DRAPES)
DRAPE SHEET LG 3/4 BI-LAMINATE (DRAPES) ×2 IMPLANT
DRAPE SURG 17X23 STRL (DRAPES) ×3 IMPLANT
DRAPE SURG ORHT 6 SPLT 77X108 (DRAPES) ×2 IMPLANT
DRSG EMULSION OIL 3X3 NADH (GAUZE/BANDAGES/DRESSINGS) ×1 IMPLANT
ELECT REM PT RETURN 15FT ADLT (MISCELLANEOUS) IMPLANT
GAUZE 4X4 16PLY ~~LOC~~+RFID DBL (SPONGE) ×3 IMPLANT
GAUZE SPONGE 4X4 12PLY STRL (GAUZE/BANDAGES/DRESSINGS) ×3 IMPLANT
GAUZE XEROFORM 1X8 LF (GAUZE/BANDAGES/DRESSINGS) IMPLANT
GLOVE BIO SURGEON STRL SZ7.5 (GLOVE) ×6 IMPLANT
GLOVE BIOGEL PI IND STRL 7.5 (GLOVE) ×2 IMPLANT
GLOVE BIOGEL PI INDICATOR 7.5 (GLOVE) ×1
GLOVE SURG ORTHO 8.0 STRL STRW (GLOVE) ×3 IMPLANT
GOWN STRL REUS W/ TWL LRG LVL3 (GOWN DISPOSABLE) ×2 IMPLANT
GOWN STRL REUS W/TWL LRG LVL3 (GOWN DISPOSABLE) ×4
HANDPIECE INTERPULSE COAX TIP (DISPOSABLE)
HIBICLENS CHG 4% 4OZ BTL (MISCELLANEOUS) ×3 IMPLANT
KIT BASIN OR (CUSTOM PROCEDURE TRAY) ×3 IMPLANT
KIT TURNOVER KIT A (KITS) ×3 IMPLANT
MANIFOLD NEPTUNE II (INSTRUMENTS) ×3 IMPLANT
NDL HYPO 25X1 1.5 SAFETY (NEEDLE) IMPLANT
NEEDLE HYPO 25X1 1.5 SAFETY (NEEDLE) IMPLANT
NS IRRIG 1000ML POUR BTL (IV SOLUTION) ×3 IMPLANT
PACK ORTHO EXTREMITY (CUSTOM PROCEDURE TRAY) ×1 IMPLANT
SET HNDPC FAN SPRY TIP SCT (DISPOSABLE) IMPLANT
SPONGE T-LAP 4X18 ~~LOC~~+RFID (SPONGE) IMPLANT
SUT ETHILON 4 0 PS 2 18 (SUTURE) IMPLANT
SYR 10ML LL (SYRINGE) ×3 IMPLANT
SYR BULB EAR ULCER 3OZ GRN STR (SYRINGE) ×4 IMPLANT
TOWEL OR 17X26 10 PK STRL BLUE (TOWEL DISPOSABLE) ×3 IMPLANT
TUBING CONNECTING 10 (TUBING) ×3 IMPLANT
UNDERPAD 30X36 HEAVY ABSORB (UNDERPADS AND DIAPERS) ×2 IMPLANT
YANKAUER SUCT BULB TIP NO VENT (SUCTIONS) ×2 IMPLANT

## 2022-03-05 NOTE — Assessment & Plan Note (Signed)
Order Protonix 40 p.o. daily

## 2022-03-05 NOTE — Assessment & Plan Note (Signed)
Continue broad-spectrum antibiotics including cefepime and vancomycin per pharmacy consult Hand surgery is aware ER will attempt I&D at bedside For tonight keep n.p.o. in case she needs any surgical intervention down the road

## 2022-03-05 NOTE — Anesthesia Preprocedure Evaluation (Addendum)
Anesthesia Evaluation  Patient identified by MRN, date of birth, ID band Patient awake    Reviewed: Allergy & Precautions, NPO status , Patient's Chart, lab work & pertinent test results, reviewed documented beta blocker date and time   Airway Mallampati: II  TM Distance: <3 FB Neck ROM: Full    Dental  (+) Dental Advisory Given, Chipped, Missing, Poor Dentition,    Pulmonary asthma , former smoker,    Pulmonary exam normal breath sounds clear to auscultation       Cardiovascular hypertension, Pt. on medications and Pt. on home beta blockers (-) angina+ Peripheral Vascular Disease and +CHF  (-) Past MI + dysrhythmias Atrial Fibrillation  Rhythm:Regular Rate:Normal + Systolic murmurs    Neuro/Psych PSYCHIATRIC DISORDERS Anxiety Depression  Neuromuscular disease    GI/Hepatic Neg liver ROS, GERD  Medicated and Controlled,  Endo/Other  diabetes, Type 2, Insulin DependentObesity   Renal/GU Renal InsufficiencyRenal disease     Musculoskeletal  (+) Arthritis , FLEXOR TENDON SHEATH INFECTION   Abdominal   Peds  Hematology  (+) Blood dyscrasia (Eliquis), anemia ,   Anesthesia Other Findings Day of surgery medications reviewed with the patient.  Reproductive/Obstetrics                            Anesthesia Physical Anesthesia Plan  ASA: 3  Anesthesia Plan: General   Post-op Pain Management: Tylenol PO (pre-op)*   Induction: Intravenous  PONV Risk Score and Plan: 3 and Dexamethasone and Ondansetron  Airway Management Planned: LMA  Additional Equipment:   Intra-op Plan:   Post-operative Plan: Extubation in OR  Informed Consent: I have reviewed the patients History and Physical, chart, labs and discussed the procedure including the risks, benefits and alternatives for the proposed anesthesia with the patient or authorized representative who has indicated his/her understanding and  acceptance.     Dental advisory given  Plan Discussed with: CRNA  Anesthesia Plan Comments:         Anesthesia Quick Evaluation

## 2022-03-05 NOTE — Progress Notes (Signed)
Pharmacy Antibiotic Note  [name] is a 66 y.o. female admitted on 03/04/2022 with cellulitis.  Pharmacy has been consulted for Cefepime & Vancomycin dosing.  Today, Scr has improved from 1.22 to 0.99. Therefore will adjust vancomycin dosing.   Plan: Cefepime 2gm IV q12h Vancomycin '1750mg'$  IV x 1 given in ED followed by vancomycin 750 mg IV q24h  Check vancomycin levels at steady state Monitor renal function and cx data   Height: 5' (152.4 cm) Weight: 75.8 kg (167 lb) IBW/kg (Calculated) : 45.5  Temp (24hrs), Avg:98.2 F (36.8 C), Min:97.6 F (36.4 C), Max:98.7 F (37.1 C)  Recent Labs  Lab 03/04/22 1655 03/05/22 0430  WBC 13.8* 16.9*  CREATININE 1.22* 0.99     Estimated Creatinine Clearance: 49.5 mL/min (by C-G formula based on SCr of 0.99 mg/dL).    Allergies  Allergen Reactions   Atorvastatin     Hands numb and tingly    Ciprofloxacin Diarrhea   Doxycycline Hyclate Nausea And Vomiting   Gabapentin Other (See Comments)    Caused upset stomach   Keflex [Cephalexin] Other (See Comments)    diarrhea   Lisinopril Other (See Comments)    "Made hands cramp"   Oxycodone Nausea And Vomiting    Tolerates hydrocodone    Penicillins Hives and Other (See Comments)    Has patient had a PCN reaction causing immediate rash, facial/tongue/throat swelling, SOB or lightheadedness with hypotension: Unknown Has patient had a PCN reaction causing severe rash involving mucus membranes or skin necrosis: Unknown Has patient had a PCN reaction that required hospitalization: Unknown Has patient had a PCN reaction occurring within the last 10 years: No If all of the above answers are "NO", then may proceed with Cephalosporin use.    Pepcid [Famotidine] Other (See Comments)    HEADACHE    Antimicrobials this admission: 6/13 Cefepime >>  6/13 Vancomycin >>   Dose adjustments this admission:  Microbiology results:   Royetta Asal, PharmD, BCPS 03/05/2022 9:50 AM

## 2022-03-05 NOTE — Assessment & Plan Note (Signed)
-   Order Sensitive  SSI   - continue home insulin regimen    Lantus 10 units,  -  check TSH and HgA1C  - Hold by mouth medications

## 2022-03-05 NOTE — Progress Notes (Signed)
Pharmacy Antibiotic Note  [name] is a 66 y.o. female admitted on 03/04/2022 with cellulitis.  Pharmacy has been consulted for Cefepime & Vancomycin dosing.  Plan: Cefepime 2gm IV q12h Vancomycin '1250mg'$  IV q48h to target AUC 400-550 Check vancomycin levels at steady state Monitor renal function and cx data   Height: 5' (152.4 cm) Weight: 75.8 kg (167 lb) IBW/kg (Calculated) : 45.5  Temp (24hrs), Avg:98.2 F (36.8 C), Min:97.6 F (36.4 C), Max:98.7 F (37.1 C)  Recent Labs  Lab 03/04/22 1655  WBC 13.8*  CREATININE 1.22*    Estimated Creatinine Clearance: 40.1 mL/min (A) (by C-G formula based on SCr of 1.22 mg/dL (H)).    Allergies  Allergen Reactions   Atorvastatin     Hands numb and tingly    Ciprofloxacin Diarrhea   Doxycycline Hyclate Nausea And Vomiting   Gabapentin Other (See Comments)    Caused upset stomach   Keflex [Cephalexin] Other (See Comments)    diarrhea   Lisinopril Other (See Comments)    "Made hands cramp"   Oxycodone Nausea And Vomiting    Tolerates hydrocodone    Penicillins Hives and Other (See Comments)    Has patient had a PCN reaction causing immediate rash, facial/tongue/throat swelling, SOB or lightheadedness with hypotension: Unknown Has patient had a PCN reaction causing severe rash involving mucus membranes or skin necrosis: Unknown Has patient had a PCN reaction that required hospitalization: Unknown Has patient had a PCN reaction occurring within the last 10 years: No If all of the above answers are "NO", then may proceed with Cephalosporin use.    Pepcid [Famotidine] Other (See Comments)    HEADACHE    Antimicrobials this admission: 6/13 Cefepime >>  6/13 Vancomycin >>   Dose adjustments this admission:  Microbiology results:  Thank you for allowing pharmacy to be a part of this patient's care.  Netta Cedars PharmD 03/05/2022 12:46 AM

## 2022-03-05 NOTE — ED Notes (Signed)
Pt given chlorhexidine wipes to self wipe per Short Stay

## 2022-03-05 NOTE — Assessment & Plan Note (Signed)
Stable appears to be euvolemic actually low but on dry side we will gently rehydrate

## 2022-03-05 NOTE — Assessment & Plan Note (Signed)
Obtain EKG monitor on telemetry hold Cozaar repeat be met after IV fluids to see if improved if not for any treatment

## 2022-03-05 NOTE — Assessment & Plan Note (Signed)
Resume Toprol when able to tolerate  Hold Eliquis for tonight in case needs any surgical procedures resume as soon as able

## 2022-03-05 NOTE — Transfer of Care (Signed)
Immediate Anesthesia Transfer of Care Note  Patient: [name]  Procedure(s) Performed: IRRIGATION AND DEBRIDEMENT OF LEFT INDEX FINGER FLEXOR TENDON SHEATH (Left)  Patient Location: PACU  Anesthesia Type:General  Level of Consciousness: awake, alert  and oriented  Airway & Oxygen Therapy: Patient Spontanous Breathing and Patient connected to face mask oxygen  Post-op Assessment: Report given to RN and Post -op Vital signs reviewed and stable  Post vital signs: Reviewed and stable  Last Vitals:  Vitals Value Taken Time  BP 116/58 03/05/22 1554  Temp 36.6 C 03/05/22 1554  Pulse 70 03/05/22 1557  Resp 16 03/05/22 1557  SpO2 94 % 03/05/22 1557  Vitals shown include unvalidated device data.  Last Pain:  Vitals:   03/05/22 1323  TempSrc: Oral  PainSc: 8          Complications: No notable events documented.

## 2022-03-05 NOTE — Consult Note (Signed)
Orthopedic Hand Surgery Consultation:  Reason for Consult: left index finger infection Referring Physician: Dr. Sedonia Small   HPI: [name] is a(an) 66 y.o. female who was trimming her rosebushes and had a thorn penetrate the volar and dorsal aspect of the left index finger about the proximal phalanx.  She has now developed a flexor tenosynovitis with some purulent drainage as well as an abscess about the dorsal aspect of the index finger at the proximal phalanx.  She has no systemic symptoms.  She has pain that is worse with movement of the digit and improved with rest.  She denies numbness or tingling to the digit.  She underwent irrigation and debridement in the emergency room last night however has not had any improvements and this was quite a small debridement considering the zone of injury.  Physical Exam: Left upper Extremity Left index finger tenderness over the flexor tendon open wound present over the MCP joint volarly.  Dorsal abscess present at site of thorn puncture over the dorsal ulnar aspect of the proximal phalanx with fluctuance and erythema.  She does have some pain with passive stretch she is unable to achieve full flexion.  Sensation tact light touch and brisk capillary refill.  X-ray 3 views of the left hand shows no radiopaque foreign bodies at the site of puncture wounds.  Assessment/Plan: Left index finger dorsal abscess as well as flexor tenosynovitis.  This is not improved with a small bedside I&D performed by the ED and IV antibiotics.  This is primarily localized over the MCP joint crease of the left index finger and a small amount dorsally and has not appeared to spread up and down the flexor tendon sheath yet however I do feel as though this is impending.  Plan for left index finger flexor tendon sheath incision and drainage as well as drainage of the dorsal index finger abscess at a separate location where a second puncture wound occurred from the [place]  thorn.  The risks and benefits of surgery were carefully explained including, but not limited to risks of infection, injury to nerves, blood vessels, neighboring structures, recurrence or continued symptoms, loss of motion or strength and the need for rehabilitation or further surgery. After thorough discussion and all questions were answered informed consent was obtained.    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 denies   Diabetes mellitus without complication ([place])    type 2   Dysrhythmia    Afib   GERD (gastroesophageal reflux disease) 11/14/2013   Gout    Hypertension    Mixed hyperlipidemia 11/14/2013   Neuromuscular disorder ([place])    neuropathy feet   Pneumonia     Past Surgical History:  Procedure Laterality Date   ABDOMINAL HYSTERECTOMY  1977   CERVICAL CA   CHOLECYSTECTOMY     EYE SURGERY     6/3 LASER FOR GLAUCOMA, 9/03 CE/IOL IMPLANTS   INGUINAL LYMPH NODE BIOPSY Right 06/08/2018   Procedure: RIGHT INGUINAL LYMPH NODE EXCISIONAL BIOPSY;  Surgeon: Clovis Riley, MD;  Location: [facility];  Service: General;  Laterality: Right;   LAPAROSCOPIC APPENDECTOMY N/A 12/17/2021   Procedure: LAPAROSCOPIC APPENDECTOMY;  Surgeon: Clovis Riley, MD;  Location: [facility];  Service:  General;  Laterality: N/A;   pancreatectomy     SHOULDER CLOSED REDUCTION Left 02/03/2013   Procedure: CLOSED MANIPULATION SHOULDER;  Surgeon: Vickey Huger, MD;  Location: [facility];  Service: Orthopedics;  Laterality: Left;  CLOSED MANIPULATION LEFT SHOULDER   SPLENECTOMY     TONSILLECTOMY  1970    Family History  Problem Relation Age of Onset   Dementia Mother    Heart disease Mother    Hypertension Mother    COPD Mother    Breast cancer Mother 66    Heart disease Father    Diabetes Father    Glaucoma Father    Cancer Father        BLADDER   Depression Sister     Social History:  reports that she quit smoking about 32 years ago. Her smoking use included cigarettes. She has never used smokeless tobacco. She reports that she does not drink alcohol and does not use drugs.  Allergies:  Allergies  Allergen Reactions   Atorvastatin     Hands numb and tingly    Ciprofloxacin Diarrhea   Doxycycline Hyclate Nausea And Vomiting   Gabapentin Other (See Comments)    Caused upset stomach   Keflex [Cephalexin] Other (See Comments)    diarrhea   Lisinopril Other (See Comments)    "Made hands cramp"   Oxycodone Nausea And Vomiting    Tolerates hydrocodone    Penicillins Hives and Other (See Comments)    Has patient had a PCN reaction causing immediate rash, facial/tongue/throat swelling, SOB or lightheadedness with hypotension: Unknown Has patient had a PCN reaction causing severe rash involving mucus membranes or skin necrosis: Unknown Has patient had a PCN reaction that required hospitalization: Unknown Has patient had a PCN reaction occurring within the last 10 years: No If all of the above answers are "NO", then may proceed with Cephalosporin use.    Pepcid [Famotidine] Other (See Comments)    HEADACHE    Medications: reviewed, no changes to patient's home medications  Results for orders placed or performed during the hospital encounter of 03/04/22 (from the past 48 hour(s))  CBC with Differential     Status: Abnormal   Collection Time: 03/04/22  4:55 PM  Result Value Ref Range   WBC 13.8 (H) 4.0 - 10.5 K/uL   RBC 4.99 3.87 - 5.11 MIL/uL   Hemoglobin 12.7 12.0 - 15.0 g/dL   HCT 41.0 36.0 - 46.0 %   MCV 82.2 80.0 - 100.0 fL   MCH 25.5 (L) 26.0 - 34.0 pg   MCHC 31.0 30.0 - 36.0 g/dL   RDW 16.6 (H) 11.5 - 15.5 %   Platelets 402 (H) 150 - 400 K/uL   nRBC 0.0 0.0 - 0.2 %   Neutrophils Relative % 62 %   Neutro Abs 8.6 (H) 1.7 -  7.7 K/uL   Lymphocytes Relative 27 %   Lymphs Abs 3.7 0.7 - 4.0 K/uL   Monocytes Relative 8 %   Monocytes Absolute 1.1 (H) 0.1 - 1.0 K/uL   Eosinophils Relative 2 %   Eosinophils Absolute 0.3 0.0 - 0.5 K/uL   Basophils Relative 1 %   Basophils Absolute 0.2 (H) 0.0 - 0.1 K/uL   Immature Granulocytes 0 %   Abs Immature Granulocytes 0.04 0.00 - 0.07 K/uL    Comment: Performed at [facility], [place] [address], [place], [place] [address]  Basic metabolic panel     Status: Abnormal   Collection Time: 03/04/22  4:55 PM  Result Value Ref Range   Sodium 136 135 - 145 mmol/L   Potassium 5.7 (H) 3.5 - 5.1 mmol/L   Chloride 100 98 - 111 mmol/L   CO2 29 22 - 32 mmol/L   Glucose, Bld 364 (H) 70 - 99 mg/dL    Comment: Glucose reference range applies only to samples taken after fasting for at least 8 hours.   BUN 25 (H) 8 - 23 mg/dL   Creatinine, Ser 1.22 (H) 0.44 - 1.00 mg/dL   Calcium 9.3 8.9 - 10.3 mg/dL   GFR, Estimated 48 (L) >60 mL/min    Comment: (NOTE) Calculated using the CKD-EPI Creatinine Equation (2021)    Anion gap 7 5 - 15    Comment: Performed at [facility], [place] [address] [place], [place] [address]  CBG monitoring, ED     Status: Abnormal   Collection Time: 03/05/22  1:25 AM  Result Value Ref Range   Glucose-Capillary 275 (H) 70 - 99 mg/dL    Comment: Glucose reference range applies only to samples taken after fasting for at least 8 hours.  CBG monitoring, ED     Status: Abnormal   Collection Time: 03/05/22  4:22 AM  Result Value Ref Range   Glucose-Capillary 191 (H) 70 - 99 mg/dL    Comment: Glucose reference range applies only to samples taken after fasting for at least 8 hours.  Hemoglobin A1c     Status: Abnormal   Collection Time: 03/05/22  4:30 AM  Result Value Ref Range   Hgb A1c MFr Bld 9.8 (H) 4.8 - 5.6 %    Comment: (NOTE) Pre diabetes:          5.7%-6.4%  Diabetes:              >6.4%  Glycemic control for    <7.0% adults with diabetes    Mean Plasma Glucose 234.56 mg/dL    Comment: Performed at [facility] [address] [place], [place] [address]  CK     Status: None   Collection Time: 03/05/22  4:30 AM  Result Value Ref Range   Total CK 88 38 - 234 U/L    Comment: Performed at [facility], [place] [address] [place], [place] [address]  Hepatic function panel     Status: Abnormal   Collection Time: 03/05/22  4:30 AM  Result Value Ref Range   Total Protein 6.5 6.5 - 8.1 g/dL   Albumin 2.9 (L) 3.5 - 5.0 g/dL   AST 21 15 - 41 U/L   ALT 14 0 - 44 U/L   Alkaline Phosphatase 119 38 - 126 U/L   Total Bilirubin 0.6 0.3 - 1.2 mg/dL   Bilirubin, Direct 0.1 0.0 - 0.2 mg/dL   Indirect Bilirubin 0.5 0.3 - 0.9 mg/dL    Comment: Performed at [facility], [place] [address] [place], [place] [address]  Magnesium     Status: None   Collection Time: 03/05/22  4:30 AM  Result Value Ref Range   Magnesium 1.9 1.7 - 2.4 mg/dL    Comment: Performed at [facility], [place] [address] [place], [place] [address]  Phosphorus     Status: None   Collection Time: 03/05/22  4:30 AM  Result Value Ref Range   Phosphorus 2.7 2.5 - 4.6 mg/dL    Comment: Performed at [facility], [place] [address], [place], [place] [address]  Prealbumin     Status:  Abnormal   Collection Time: 03/05/22  4:30 AM  Result Value Ref Range   Prealbumin 12.7 (L) 18 - 38 mg/dL    Comment: Performed at [place] [address], [place], [place] [address]  HIV Antibody (routine testing w rflx)     Status: None   Collection Time: 03/05/22  4:30 AM  Result Value Ref Range   HIV Screen 4th Generation wRfx Non Reactive Non Reactive    Comment: Performed at [facility], [place] [address] [place], [place] [address]  Comprehensive metabolic panel     Status: Abnormal   Collection Time: 03/05/22  4:30 AM  Result Value Ref Range   Sodium 139 135 - 145  mmol/L   Potassium 4.4 3.5 - 5.1 mmol/L    Comment: DELTA CHECK NOTED   Chloride 107 98 - 111 mmol/L   CO2 25 22 - 32 mmol/L   Glucose, Bld 204 (H) 70 - 99 mg/dL    Comment: Glucose reference range applies only to samples taken after fasting for at least 8 hours.   BUN 19 8 - 23 mg/dL   Creatinine, Ser 0.99 0.44 - 1.00 mg/dL   Calcium 8.5 (L) 8.9 - 10.3 mg/dL   Total Protein 6.6 6.5 - 8.1 g/dL   Albumin 3.0 (L) 3.5 - 5.0 g/dL   AST 21 15 - 41 U/L   ALT 15 0 - 44 U/L   Alkaline Phosphatase 119 38 - 126 U/L   Total Bilirubin 0.5 0.3 - 1.2 mg/dL   GFR, Estimated >60 >60 mL/min    Comment: (NOTE) Calculated using the CKD-EPI Creatinine Equation (2021)    Anion gap 7 5 - 15    Comment: Performed at [facility], [place] [address] [place], [place] [address]  CBC     Status: Abnormal   Collection Time: 03/05/22  4:30 AM  Result Value Ref Range   WBC 16.9 (H) 4.0 - 10.5 K/uL    Comment: WHITE COUNT CONFIRMED ON SMEAR   RBC 4.48 3.87 - 5.11 MIL/uL   Hemoglobin 11.2 (L) 12.0 - 15.0 g/dL   HCT 36.6 36.0 - 46.0 %   MCV 81.7 80.0 - 100.0 fL   MCH 25.0 (L) 26.0 - 34.0 pg   MCHC 30.6 30.0 - 36.0 g/dL   RDW 16.5 (H) 11.5 - 15.5 %   Platelets 344 150 - 400 K/uL   nRBC 0.0 0.0 - 0.2 %    Comment: Performed at [facility], [place] [address] [place], [place] [address]  Procalcitonin - Baseline     Status: None   Collection Time: 03/05/22  4:30 AM  Result Value Ref Range   Procalcitonin <0.10 ng/mL    Comment:        Interpretation: PCT (Procalcitonin) <= 0.5 ng/mL: Systemic infection (sepsis) is not likely. Local bacterial infection is possible. (NOTE)       Sepsis PCT Algorithm           Lower Respiratory Tract                                      Infection PCT Algorithm    ----------------------------     ----------------------------         PCT < 0.25 ng/mL                PCT < 0.10 ng/mL  Strongly encourage             Strongly  discourage   discontinuation of antibiotics    initiation of antibiotics    ----------------------------     -----------------------------       PCT 0.25 - 0.50 ng/mL            PCT 0.10 - 0.25 ng/mL               OR       >80% decrease in PCT            Discourage initiation of                                            antibiotics      Encourage discontinuation           of antibiotics    ----------------------------     -----------------------------         PCT >= 0.50 ng/mL              PCT 0.26 - 0.50 ng/mL               AND        <80% decrease in PCT             Encourage initiation of                                             antibiotics       Encourage continuation           of antibiotics    ----------------------------     -----------------------------        PCT >= 0.50 ng/mL                  PCT > 0.50 ng/mL               AND         increase in PCT                  Strongly encourage                                      initiation of antibiotics    Strongly encourage escalation           of antibiotics                                     -----------------------------                                           PCT <= 0.25 ng/mL                                                 OR                                        >  80% decrease in PCT                                      Discontinue / Do not initiate                                             antibiotics  Performed at [place] [address], [place], [place] [address]   CBG monitoring, ED     Status: Abnormal   Collection Time: 03/05/22  8:20 AM  Result Value Ref Range   Glucose-Capillary 143 (H) 70 - 99 mg/dL    Comment: Glucose reference range applies only to samples taken after fasting for at least 8 hours.    DG Hand Complete Left  Result Date: 03/04/2022 CLINICAL DATA:  Trauma, pain and swelling EXAM: LEFT HAND - COMPLETE 3+ VIEW COMPARISON:  None Available. FINDINGS: No  fracture or dislocation is seen. There are no radiopaque foreign bodies. Small cystic foci seen in the lunate may be due to degenerative arthritis. IMPRESSION: No fracture or dislocation is seen in the left hand. There are no radiopaque foreign bodies. Electronically Signed   By: Elmer Picker M.D.   On: 03/04/2022 16:47    ROS: 14 point review of systems negative except per HPI

## 2022-03-05 NOTE — Anesthesia Postprocedure Evaluation (Signed)
Anesthesia Post Note  Patient: [name]  Procedure(s) Performed: IRRIGATION AND DEBRIDEMENT OF LEFT INDEX FINGER FLEXOR TENDON SHEATH (Left)     Patient location during evaluation: PACU Anesthesia Type: General Level of consciousness: awake and alert Pain management: pain level controlled Vital Signs Assessment: post-procedure vital signs reviewed and stable Respiratory status: spontaneous breathing, nonlabored ventilation, respiratory function stable and patient connected to nasal cannula oxygen Cardiovascular status: blood pressure returned to baseline and stable Postop Assessment: no apparent nausea or vomiting Anesthetic complications: no   No notable events documented.  Last Vitals:  Vitals:   03/05/22 1624 03/05/22 1648  BP: (!) 139/92 132/61  Pulse: 67 67  Resp: 11 16  Temp: 36.6 C 36.6 C  SpO2: 92% 98%    Last Pain:  Vitals:   03/05/22 1648  TempSrc: Oral  PainSc:                  [place] Lighter

## 2022-03-05 NOTE — Op Note (Signed)
OPERATIVE NOTE  DATE OF PROCEDURE: 03/05/2022  SURGEONS:  Primary: Orene Desanctis, MD  PREOPERATIVE DIAGNOSIS: Left index finger flexor tendon sheath incision and drainage, left index finger dorsal abscess incision and drainage  POSTOPERATIVE DIAGNOSIS: Same  NAME OF PROCEDURE:   Left index finger flexor tendon sheath incision and drainage Left index finger incision and drainage of dorsal abscess  ANESTHESIA: LMA + Local  SKIN PREPARATION: Hibiclens  ESTIMATED BLOOD LOSS: Minimal  IMPLANTS: none  INDICATIONS:  [name] is a 66 y.o. female who has the above preoperative diagnosis. The patient has decided to proceed with surgical intervention.  Risks, benefits and alternatives of operative management were discussed including, but not limited to, risks of anesthesia complications, infection, pain, persistent symptoms, stiffness, need for future surgery.  The patient understands, agrees and elects to proceed with surgery.    DESCRIPTION OF PROCEDURE: The patient was met in the pre-operative area and their identity was verified.  The operative location and laterality was also verified and marked.  The patient was brought to the OR and was placed supine on the table.  After repeat patient identification with the operative team anesthesia was provided and the patient was prepped and draped in the usual sterile fashion.  A final timeout was performed verifying the correction patient, procedure, location and laterality.  The left upper extremity was elevated and tourniquet inflated to 250 mmHg.  A Bruner incision was made over the left index finger at the level of the foreign injury at the proximal phalanx.  Skin and subcutaneous tissues were divided down to the level of the tendon sheath.  There was a puncture wound within the A2 pulley of the tendon sheath.  There was purulent drainage.  Cultures were obtained.  An irrigation of the flexor tendon sheath was then performed.  An incision over the A1  pulley and the A5 pulley were made in transverse fashion.  These were released and then an 18-gauge Angiocath was utilized to irrigate the tendon sheath all the way from the proximal aspect of the distal aspect.  There was good flow through the tendon sheath and no further purulence remaining.  All devitalized tissue was removed and excisional debridement was performed at the level of the thorn injury there was no foreign body present.  Attention was then turned dorsally over the left index finger proximal phalanx there was an area of fluctuance at the second site of thorn puncture.  A small transverse incision was made in line with the traumatic puncture wound.  Skin and subcutaneous tissues were divided and the extensor tendon was intact there was purulent drainage dorsal to the extensor tendon loculations were broken up with a tenotomy scissor and the wound was thoroughly irrigated.  A total of 3 L of normal saline was utilized via low flow cystoscopy tubing and the Angiocath in order to perform to the irrigation to both of the dorsal side and volar aspect of the left index finger.  At this time the wounds were left open there were dressed with bacitracin Adaptic and a sterile soft bandage.  Tourniquet was deflated and the left index finger and all other digits were pink and warm and well-perfused with brisk capillary refill.  All counts were correct x2.  Patient was awoke from anesthesia and brought to PACU for recovery in stable condition.   Matt Holmes, MD

## 2022-03-05 NOTE — Assessment & Plan Note (Signed)
Given elevated potassium hold Cozaar for tonight Hold hydrochlorothiazide.  Resume Norvasc 5 mg daily when blood pressure allows.  Resume Toprol to 25 mg twice a day

## 2022-03-05 NOTE — Progress Notes (Signed)
PROGRESS NOTE  [name]  DOB: 09/30/1955  PCP: Johna Roles, [place] MRN:[id number]  DOA: 03/04/2022  LOS: 0 days  Hospital Day: 2  Brief narrative: [name] is a 66 y.o. female with PMH significant for DM2, HTN, HLD, CHF, A-fib, gout, neuropathy, arthritis, history of splenectomy. 6/12, patient presented to the ED with complaint of progressive worsening of left hand.  She apparently punctured her hand on a rose bush last week and was seen by her PCP, started on oral Keflex despite which she continued to have progressive swelling and redness and hence presented to the ED.  In the ED, patient was afebrile, blood pressure elevated to 141/72 Labs showed potassium elevated to 5.6, glucose level elevated 364, creatinine elevated 1.22, WBC count elevated to 13.8 X-ray unremarkable for bony abnormality or foreign body. Started on IV cefepime and vancomycin Admitted to hospitalist service  Subjective: Patient was seen and examined this morning.  Pleasant elderly Caucasian female.  Sitting up in bed.  Not in distress Chart reviewed Remains hemodynamically stable Labs this morning with improving creatinine, WBC count elevated to 16.9  Principal Problem:   Cellulitis of hand, left Active Problems:   Hyperkalemia   DM (diabetes mellitus), type 2 (HCC)   Essential hypertension   GERD (gastroesophageal reflux disease)   Chronic diastolic heart failure (HCC)   Paroxysmal atrial fibrillation (HCC)    Assessment and plan: Cellulitis of hand, left -Started after a prick by Rose thorn -Currently on broad-spectrum antibiotics. -Discussed with hand surgeon Dr. Greta Doom this morning.  Plan for OR today  Hyperkalemia -Potassium level was elevated to 5.7 on admission.  Improved on a.m. labs today. Recent Labs  Lab 03/04/22 1655 03/05/22 0430  K 5.7* 4.4  MG  --  1.9  PHOS  --  2.7   Uncontrolled type 2 diabetes mellitus -A1c 10.9 on 12/13/2021 -Home meds include Tresiba 10 to  12 units daily, Premeal NovoLog 3 to 5 units 3 times daily -Currently on Lantus 10 units daily, sliding scale insulin with Accu-Cheks -Blood sugar level remains elevated.  Continue to monitor -Continue Lyrica for neuropathy Recent Labs  Lab 03/05/22 0125 03/05/22 0422 03/05/22 0820 03/05/22 1143 03/05/22 1328  GLUCAP 275* 191* 143* 160* 175*   Hyperlipidemia -Statin  Chronic diastolic heart failure Essential hypertension -Blood pressure in normal range.   -PTA, patient was not metoprolol, Cozaar, Norvasc, HCTZ  -Continue metoprolol and Norvasc.  Continue to hold HCTZ and Cozaar.   -Last echo from 2020 with mild concentric LVH  A-fib -Continue metoprolol. -Eliquis on hold for possible need of surgical intervention  CKD 3a -Creatinine this morning seems better than baseline Recent Labs    12/13/21 1440 03/04/22 1655 03/05/22 0430  BUN 57* 25* 19  CREATININE 1.43* 1.22* 0.99   GERD (gastroesophageal reflux disease) -Continue Protonix 40 p.o. daily    Goals of care   Code Status: Full Code    Mobility: Encourage ambulation  Skin assessment:     Nutritional status:  Body mass index is 31.25 kg/m.          Diet:  Diet Order             Diet NPO time specified  Diet effective now                   DVT prophylaxis:  SCDs Start: 03/05/22 0406 SCDs Start: 03/05/22 0019   Antimicrobials: Cefepime Fluid: NS at 27 mill per hour Consultants: Hand surgery Family  Communication: None at bedside  Status is: Inpatient  Continue in-hospital care because: Pending I&D in OR Level of care: Telemetry   Dispo: The patient is from: Home              Anticipated d/c is to: Pending clinical course              Patient currently is not medically stable to d/c.   Difficult to place patient No     Infusions:   sodium chloride Stopped (03/05/22 0437)   [MAR Hold] ceFEPime (MAXIPIME) IV     lactated ringers 10 mL/hr at 03/05/22 1342   [MAR Hold]  vancomycin      Scheduled Meds:  [MAR Hold] amLODipine  5 mg Oral Daily   [MAR Hold] FLUoxetine  20 mg Oral Daily   [MAR Hold] insulin aspart  0-9 Units Subcutaneous Q4H   [MAR Hold] insulin glargine-yfgn  10 Units Subcutaneous QHS   [MAR Hold] metoprolol succinate  25 mg Oral BID   [MAR Hold] pantoprazole  40 mg Oral Daily   [MAR Hold] pregabalin  150 mg Oral Daily    PRN meds: [MAR Hold] acetaminophen **OR** [MAR Hold] acetaminophen, [MAR Hold] albuterol, [MAR Hold] clonazePAM, [MAR Hold] fentaNYL (SUBLIMAZE) injection, [MAR Hold] HYDROcodone-acetaminophen, [MAR Hold] traMADol   Antimicrobials: Anti-infectives (From admission, onward)    Start     Dose/Rate Route Frequency Ordered Stop   03/06/22 2200  vancomycin (VANCOREADY) IVPB 1250 mg/250 mL  Status:  Discontinued        1,250 mg 166.7 mL/hr over 90 Minutes Intravenous Every 48 hours 03/05/22 0056 03/05/22 0951   03/05/22 2200  [MAR Hold]  vancomycin (VANCOREADY) IVPB 750 mg/150 mL        (MAR Hold since Tue 03/05/2022 at 1257.Hold Reason: Transfer to a Procedural area)   750 mg 150 mL/hr over 60 Minutes Intravenous Every 24 hours 03/05/22 0951     03/05/22 1200  [MAR Hold]  ceFEPIme (MAXIPIME) 2 g in sodium chloride 0.9 % 100 mL IVPB        (MAR Hold since Tue 03/05/2022 at 1257.Hold Reason: Transfer to a Procedural area)   2 g 200 mL/hr over 30 Minutes Intravenous Every 12 hours 03/05/22 0056     03/04/22 2345  ceFEPIme (MAXIPIME) 2 g in sodium chloride 0.9 % 100 mL IVPB        2 g 200 mL/hr over 30 Minutes Intravenous  Once 03/04/22 2333 03/05/22 0437   03/04/22 2315  vancomycin (VANCOREADY) IVPB 1750 mg/350 mL        1,750 mg 175 mL/hr over 120 Minutes Intravenous  Once 03/04/22 2312 03/05/22 0238       Objective: Vitals:   03/05/22 1230 03/05/22 1323  BP: 138/68 138/64  Pulse: 72 69  Resp: 16 18  Temp:  98.9 F (37.2 C)  SpO2: 100% 94%    Intake/Output Summary (Last 24 hours) at 03/05/2022 1351 Last data  filed at 03/05/2022 0437 Gross per 24 hour  Intake 1425.29 ml  Output --  Net 1425.29 ml   Filed Weights   03/04/22 1628 03/05/22 1323  Weight: 75.8 kg 72.6 kg   Weight change:  Body mass index is 31.25 kg/m.   Physical Exam: General exam: Pleasant, elderly Caucasian female.  Not in distress Skin: No rashes, lesions or ulcers. HEENT: Atraumatic, normocephalic, no obvious bleeding Lungs: Clear to auscultation bilaterally CVS: Regular rate and rhythm, no murmur GI/Abd soft, nontender, nondistended, bowel sound present CNS: Alert,  awake, oriented x3 Psychiatry: Mood appropriate Extremities: No pedal edema, no calf tenderness.  Left hand cellulitis area bandaged.  Data Review: I have personally reviewed the laboratory data and studies available.  F/u labs ordered Unresulted Labs (From admission, onward)    None       Signed, Caitlyn Croak, MD [facility] 03/05/2022

## 2022-03-05 NOTE — Anesthesia Procedure Notes (Signed)
Procedure Name: LMA Insertion Date/Time: 03/05/2022 2:58 PM  Performed by: Antonio Woodhams D, CRNAPre-anesthesia Checklist: Patient identified, Emergency Drugs available, Suction available and Patient being monitored Patient Re-evaluated:Patient Re-evaluated prior to induction Oxygen Delivery Method: Circle system utilized Preoxygenation: Pre-oxygenation with 100% oxygen Induction Type: IV induction Ventilation: Mask ventilation without difficulty LMA: LMA inserted LMA Size: 4.0 Tube type: Oral Number of attempts: 1 Placement Confirmation: positive ETCO2 and breath sounds checked- equal and bilateral Tube secured with: Tape Dental Injury: Teeth and Oropharynx as per pre-operative assessment

## 2022-03-06 ENCOUNTER — Other Ambulatory Visit (HOSPITAL_COMMUNITY): Payer: Self-pay

## 2022-03-06 ENCOUNTER — Encounter (HOSPITAL_COMMUNITY): Payer: Self-pay | Admitting: Orthopedic Surgery

## 2022-03-06 DIAGNOSIS — L03114 Cellulitis of left upper limb: Secondary | ICD-10-CM | POA: Diagnosis not present

## 2022-03-06 LAB — BASIC METABOLIC PANEL
Anion gap: 6 (ref 5–15)
BUN: 17 mg/dL (ref 8–23)
CO2: 29 mmol/L (ref 22–32)
Calcium: 9.1 mg/dL (ref 8.9–10.3)
Chloride: 105 mmol/L (ref 98–111)
Creatinine, Ser: 1.13 mg/dL — ABNORMAL HIGH (ref 0.44–1.00)
GFR, Estimated: 53 mL/min — ABNORMAL LOW (ref >60)
Glucose, Bld: 164 mg/dL — ABNORMAL HIGH (ref 70–99)
Potassium: 4.9 mmol/L (ref 3.5–5.1)
Sodium: 140 mmol/L (ref 135–145)

## 2022-03-06 LAB — CBC
HCT: 39.3 % (ref 36.0–46.0)
Hemoglobin: 12.1 g/dL (ref 12.0–15.0)
MCH: 25.3 pg — ABNORMAL LOW (ref 26.0–34.0)
MCHC: 30.8 g/dL (ref 30.0–36.0)
MCV: 82 fL (ref 80.0–100.0)
Platelets: 372 10*3/uL (ref 150–400)
RBC: 4.79 MIL/uL (ref 3.87–5.11)
RDW: 16.6 % — ABNORMAL HIGH (ref 11.5–15.5)
WBC: 10.9 10*3/uL — ABNORMAL HIGH (ref 4.0–10.5)
nRBC: 0 % (ref 0.0–0.2)

## 2022-03-06 LAB — GLUCOSE, CAPILLARY
Glucose-Capillary: 144 mg/dL — ABNORMAL HIGH (ref 70–99)
Glucose-Capillary: 147 mg/dL — ABNORMAL HIGH (ref 70–99)
Glucose-Capillary: 150 mg/dL — ABNORMAL HIGH (ref 70–99)
Glucose-Capillary: 183 mg/dL — ABNORMAL HIGH (ref 70–99)
Glucose-Capillary: 187 mg/dL — ABNORMAL HIGH (ref 70–99)
Glucose-Capillary: 228 mg/dL — ABNORMAL HIGH (ref 70–99)
Glucose-Capillary: 387 mg/dL — ABNORMAL HIGH (ref 70–99)

## 2022-03-06 MED ORDER — HYDROXYZINE HCL 10 MG PO TABS
10.0000 mg | ORAL_TABLET | Freq: Once | ORAL | Status: AC
  Administered 2022-03-06: 10 mg via ORAL
  Filled 2022-03-06 (×3): qty 1

## 2022-03-06 MED ORDER — APIXABAN 5 MG PO TABS
5.0000 mg | ORAL_TABLET | Freq: Two times a day (BID) | ORAL | Status: DC
  Administered 2022-03-06 – 2022-03-07 (×2): 5 mg via ORAL
  Filled 2022-03-06 (×2): qty 1

## 2022-03-06 MED ORDER — INSULIN GLARGINE-YFGN 100 UNIT/ML ~~LOC~~ SOLN
12.0000 [IU] | Freq: Every day | SUBCUTANEOUS | Status: DC
  Administered 2022-03-06: 12 [IU] via SUBCUTANEOUS
  Filled 2022-03-06 (×2): qty 0.12

## 2022-03-06 MED ORDER — INSULIN ASPART 100 UNIT/ML IJ SOLN
3.0000 [IU] | Freq: Three times a day (TID) | INTRAMUSCULAR | Status: DC
  Administered 2022-03-06 – 2022-03-07 (×3): 3 [IU] via SUBCUTANEOUS

## 2022-03-06 MED ORDER — VANCOMYCIN HCL 1250 MG/250ML IV SOLN
1250.0000 mg | INTRAVENOUS | Status: DC
  Filled 2022-03-06: qty 250

## 2022-03-06 NOTE — Progress Notes (Addendum)
PROGRESS NOTE [name]  VHQ:[id number] DOB: May 04, 1956 DOA: 03/04/2022 PCP: Johna Roles, [place]   Brief Narrative/Hospital Course: 66 y.o. female with PMH significant for DM2, HTN, HLD, CHF, A-fib, gout, neuropathy, arthritis, history of splenectomy. 6/12, patient presented to the ED with complaint of progressive worsening of left hand.  She apparently punctured her hand on a rose bush last week and was seen by her PCP, started on oral Keflex despite which she continued to have progressive swelling and redness and hence presented to the ED. In the ED, patient was afebrile, blood pressure elevated to 141/72 Labs showed potassium elevated to 5.6, glucose level elevated 364, creatinine elevated 1.22, WBC count elevated to 13.8 X-ray unremarkable for bony abnormality or foreign body. Started on IV cefepime and vancomycin. Admitted to hospitalist service. Patient was seen by Dr. Barnie Alderman left index finger flexor tendon sheath incision and drainage, left index finger incision and drainage of dorsal abscess. Patient was monitored overnight.  Wound culture rare WBC no organisms since less than 24 hours.  Patient has been afebrile.  Leukocytosis has resolved.    Subjective: Seen this am, feels well, co ongoing pain at surgery site Sugar also high   Assessment and Plan: Principal Problem:   Cellulitis of hand, left Active Problems:   Hyperkalemia   DM (diabetes mellitus), type 2 (HCC)   Essential hypertension   GERD (gastroesophageal reflux disease)   Chronic diastolic heart failure (HCC)   Paroxysmal atrial fibrillation (HCC)   Cellulitis of hand, left: After thorn injury seen by Dr. Greta Doom underwent I&D of dorsal abscess Left index finger flexor tendon sheath.  Wound culture no growth so far, at this time leukocytosis resolved - cont vancomycin and cefepime. Will monitor one more day.cont pain control w/ Norco.   Hyperkalemia: Resolved DM type 2 with uncontrolled hyperglycemia,  blood sugar in 200s 300s at times.  At home on 3 to 5 units Premeal, and 10 u tresiba daily- cont to adjust insulin. Essential hypertension: BP well controlled.  On amlodipine, HCTZ, losartan, metoprolol at home continue home meds as BP tolerates GERD: Stable Chronic diastolic heart failure: Stable volume status Paroxysmal atrial fibrillation: Rate controlled resume Eliquis if okay with surgery. Cont metoprolol.  Class I Obesity:Patient's Body mass index is 31.25 kg/m. : Will benefit with PCP follow-up, weight loss  healthy lifestyle and outpatient sleep evaluation.  DVT prophylaxis: SCDs Start: 03/05/22 0406 SCDs Start: 03/05/22 0019 Code Status:   Code Status: Full Code Family Communication: plan of care discussed with patient at bedside. Patient status is: inpatient because of for finger infection and hyperglycemia Level of care: Telemetry   Dispo: The patient is from: home            Anticipated disposition: home tomorrow if sugar stable and had pain better,.  Mobility Assessment (last 72 hours)     Mobility Assessment     Row Name 03/05/22 1700           Does patient have an order for bedrest or is patient medically unstable No - Continue assessment       What is the highest level of mobility based on the progressive mobility assessment? Level 6 (Walks independently in room and hall) - Balance while walking in room without assist - Complete                 Objective: Vitals last 24 hrs: Vitals:   03/06/22 0001 03/06/22 0408 03/06/22 0733 03/06/22 1326  BP: 138/61 (!) 142/60 Marland Kitchen)  119/95 129/62  Pulse: 70 70 73 70  Resp: '15 13 18 18  '$ Temp: 98.6 F (37 C) 99.3 F (37.4 C) 98.2 F (36.8 C) 97.8 F (36.6 C)  TempSrc:  Oral Oral Oral  SpO2: 93% 98% 95% 98%  Weight:      Height:       Weight change: -3.175 kg  Physical Examination: General exam: alert awake,older than stated age, weak appearing. HEENT:Oral mucosa moist, Ear/Nose WNL grossly, dentition  normal. Respiratory system: bilaterally diminished BS, no use of accessory muscle Cardiovascular system: S1 & S2 +, No JVD. Gastrointestinal system: Abdomen soft,NT,ND, BS+ Nervous System:Alert, awake, moving extremities and grossly nonfocal Extremities: LE edema neg. left Hand in dressing intact- distal finger tips perfused, sensation intact. Skin: No rashes,no icterus. MSK: Normal muscle bulk,tone, power  Medications reviewed:  Scheduled Meds:  amLODipine  5 mg Oral Daily   FLUoxetine  20 mg Oral Daily   insulin aspart  0-9 Units Subcutaneous Q4H   insulin aspart  3 Units Subcutaneous TID WC   insulin glargine-yfgn  12 Units Subcutaneous QHS   metoprolol succinate  25 mg Oral BID   pantoprazole  40 mg Oral Daily   pregabalin  150 mg Oral Daily   Continuous Infusions:  ceFEPime (MAXIPIME) IV 200 mL/hr at 03/06/22 1334   [START ON 03/07/2022] vancomycin        Diet Order             Diet Carb Modified Fluid consistency: Thin; Room service appropriate? Yes  Diet effective now                            Intake/Output Summary (Last 24 hours) at 03/06/2022 1537 Last data filed at 03/06/2022 1334 Gross per 24 hour  Intake 1757.89 ml  Output --  Net 1757.89 ml   Net IO Since Admission: 3,183.18 mL [03/06/22 1537]  Wt Readings from Last 3 Encounters:  03/05/22 72.6 kg  01/30/22 75.9 kg  12/17/21 73 kg     Unresulted Labs (From admission, onward)    None     Data Reviewed: I have personally reviewed following labs and imaging studies CBC: Recent Labs  Lab 03/04/22 1655 03/05/22 0430 03/06/22 0738  WBC 13.8* 16.9* 10.9*  NEUTROABS 8.6*  --   --   HGB 12.7 11.2* 12.1  HCT 41.0 36.6 39.3  MCV 82.2 81.7 82.0  PLT 402* 344 169   Basic Metabolic Panel: Recent Labs  Lab 03/04/22 1655 03/05/22 0430 03/06/22 0738  NA 136 139 140  K 5.7* 4.4 4.9  CL 100 107 105  CO2 '29 25 29  '$ GLUCOSE 364* 204* 164*  BUN 25* 19 17  CREATININE 1.22* 0.99 1.13*   CALCIUM 9.3 8.5* 9.1  MG  --  1.9  --   PHOS  --  2.7  --    GFR: Estimated Creatinine Clearance: 42.3 mL/min (A) (by C-G formula based on SCr of 1.13 mg/dL (H)). Liver Function Tests: Recent Labs  Lab 03/05/22 0430  AST 21  21  ALT 14  15  ALKPHOS 119  119  BILITOT 0.6  0.5  PROT 6.5  6.6  ALBUMIN 2.9*  3.0*   No results for input(s): "LIPASE", "AMYLASE" in the last 168 hours. No results for input(s): "AMMONIA" in the last 168 hours. Coagulation Profile: No results for input(s): "INR", "PROTIME" in the last 168 hours. BNP (last 3 results) No results for input(s): "  PROBNP" in the last 8760 hours. HbA1C: Recent Labs    03/05/22 0430  HGBA1C 9.8*   CBG: Recent Labs  Lab 03/05/22 2013 03/05/22 2358 03/06/22 0408 03/06/22 0732 03/06/22 1206  GLUCAP 162* 228* 183* 150* 387*   Lipid Profile: No results for input(s): "CHOL", "HDL", "LDLCALC", "TRIG", "CHOLHDL", "LDLDIRECT" in the last 72 hours. Thyroid Function Tests: No results for input(s): "TSH", "T4TOTAL", "FREET4", "T3FREE", "THYROIDAB" in the last 72 hours. Sepsis Labs: Recent Labs  Lab 03/05/22 0430  PROCALCITON <0.10    Recent Results (from the past 240 hour(s))  Aerobic/Anaerobic Culture w Gram Stain (surgical/deep wound)     Status: None (Preliminary result)   Collection Time: 03/05/22  3:21 PM   Specimen: PATH Other; Tissue  Result Value Ref Range Status   Specimen Description   Final    TISSUE Performed at [facility], [place] [address], [place], [place] [address]    Special Requests LEFT INDEX FINGER  Final   Gram Stain   Final    RARE WBC PRESENT, PREDOMINANTLY MONONUCLEAR NO ORGANISMS SEEN    Culture   Final    NO GROWTH < 24 HOURS Performed at [facility], [place] [address] [place], [place] [address]    Report Status PENDING  Incomplete    Antimicrobials: Anti-infectives (From admission, onward)    Start     Dose/Rate Route Frequency Ordered Stop    03/07/22 1000  vancomycin (VANCOREADY) IVPB 1250 mg/250 mL        1,250 mg 166.7 mL/hr over 90 Minutes Intravenous Every 48 hours 03/06/22 1024     03/06/22 2200  vancomycin (VANCOREADY) IVPB 1250 mg/250 mL  Status:  Discontinued        1,250 mg 166.7 mL/hr over 90 Minutes Intravenous Every 48 hours 03/05/22 0056 03/05/22 0951   03/05/22 2200  vancomycin (VANCOREADY) IVPB 750 mg/150 mL  Status:  Discontinued        750 mg 150 mL/hr over 60 Minutes Intravenous Every 24 hours 03/05/22 0951 03/06/22 1024   03/05/22 1200  ceFEPIme (MAXIPIME) 2 g in sodium chloride 0.9 % 100 mL IVPB        2 g 200 mL/hr over 30 Minutes Intravenous Every 12 hours 03/05/22 0056     03/04/22 2345  ceFEPIme (MAXIPIME) 2 g in sodium chloride 0.9 % 100 mL IVPB        2 g 200 mL/hr over 30 Minutes Intravenous  Once 03/04/22 2333 03/05/22 0437   03/04/22 2315  vancomycin (VANCOREADY) IVPB 1750 mg/350 mL        1,750 mg 175 mL/hr over 120 Minutes Intravenous  Once 03/04/22 2312 03/05/22 0238      Culture/Microbiology    Component Value Date/Time   SDES  03/05/2022 1521    TISSUE Performed at [facility], [place] [address], [place], [place] [address]    SPECREQUEST LEFT INDEX FINGER 03/05/2022 1521   CULT  03/05/2022 1521    NO GROWTH < 24 HOURS Performed at [facility], [place] [address], [place], [place] [address]    REPTSTATUS PENDING 03/05/2022 1521  Radiology Studies: DG Hand Complete Left  Result Date: 03/04/2022 CLINICAL DATA:  Trauma, pain and swelling EXAM: LEFT HAND - COMPLETE 3+ VIEW COMPARISON:  None Available. FINDINGS: No fracture or dislocation is seen. There are no radiopaque foreign bodies. Small cystic foci seen in the lunate may be due to degenerative arthritis. IMPRESSION: No fracture or dislocation is seen  in the left hand. There are no radiopaque foreign bodies. Electronically Signed   By: Elmer Picker M.D.   On: 03/04/2022 16:47     LOS: 1 day   Antonieta Pert,  MD [facility]  03/06/2022, 3:37 PM

## 2022-03-06 NOTE — Discharge Summary (Signed)
Physician Discharge Summary  ODESSIE POLZIN CBS:[id number] DOB: [birth year] DOA: 03/04/2022  PCP: Johna Roles, PA  Admit date: 03/04/2022 Discharge date: 03/07/2022 Recommendations for Outpatient Follow-up:  Follow up with PCP in 1 weeks-call for appointment Please obtain BMP/CBC in one week Dr Greta Doom 1 wk post op  Discharge Dispo: home Discharge Condition: Stable Code Status:   Code Status: Full Code Diet recommendation:  Diet Order             Diet Carb Modified Fluid consistency: Thin; Room service appropriate? Yes  Diet effective now                    Brief/Interim Summary: 66 y.o. female with PMH significant for DM2, HTN, HLD, CHF, A-fib, gout, neuropathy, arthritis, history of splenectomy patient presented to the ED with complaint of progressive worsening of left hand.  She apparently punctured her hand on a rose bush last week and was seen by her PCP, started on oral Keflex despite which she continued to have progressive swelling and redness and hence presented to the ED. In the ED, patient was afebrile, blood pressure elevated to 141/72 Labs showed potassium elevated to 5.6, glucose level elevated 364, creatinine elevated 1.22, WBC count elevated to 13.8 X-ray unremarkable for bony abnormality or foreign body. Started on IV cefepime and vancomycin. Admitted to hospitalist service. Patient was seen by Dr. Barnie Alderman left index finger flexor tendon sheath incision and drainage, left index finger incision and drainage of dorsal abscess. Patient was monitored overnight.  Wound culture rare WBC no organisms.  She has been afebrile leukocytosis resolved, discussed with Dr. Greta Doom, who has cleared the patient for discharge and he will follow-up as outpatient for postop care discussed with pharmacy will discharge on Zyvox/cefpodoxime for ongoing wound care management.  Sediments medically stable for discharge blood sugar has stabilized.   Discharge Diagnoses:  Principal  Problem:   Cellulitis of hand, left Active Problems:   Hyperkalemia   DM (diabetes mellitus), type 2 (HCC)   Essential hypertension   GERD (gastroesophageal reflux disease)   Chronic diastolic heart failure (HCC)   Paroxysmal atrial fibrillation (HCC)  Cellulitis of hand, left: After thorn injury seen by Dr. Greta Doom underwent I&D of dorsal abscess Left index finger flexor tendon sheath.  Wound culture no growth so far, at this time leukocytosis resolved treated with vancomycin and cefepime.  Discussed with pharmacy for antibiotics, will transition to Zyvox/ omnicef for home until follow-up with surgery for postop wound care, he has placed instruction in AVS.  Hyperkalemia: Resolved DM type 2 with uncontrolled hyperglycemia, blood sugar has nice to lysed continue to resume home meds upon discharge continue diabetic diet  meds Essential hypertension: BP well controlled.  On amlodipine, HCTZ, losartan, metoprolol at home continue home meds as BP tolerates GERD: Stable Chronic diastolic heart failure: Stable volume status Paroxysmal atrial fibrillation: Rate controlled resume Eliquis/metoprolol.  Consults: Dr Greta Doom Subjective: Alert awake oriented, no fever. No new complaints.  Discharge Exam: Vitals:   03/07/22 0305 03/07/22 1132  BP: 114/63 (!) 153/70  Pulse: 62 70  Resp: 16   Temp: 97.8 F (36.6 C)   SpO2: (!) 86%    General: Pt is alert, awake, not in acute distress Cardiovascular: RRR, S1/S2 +, no rubs, no gallops Respiratory: CTA bilaterally, no wheezing, no rhonchi Abdominal: Soft, NT, ND, bowel sounds + Extremities: no edema, no cyanosis  Discharge Instructions  Discharge Instructions     Discharge instructions   Complete by: As  directed    Continue wound care as instructed by Dr. Greta Doom follow-up with the office 1 week postop  Please call call MD or return to ER for similar or worsening recurring problem that brought you to hospital or if any  fever,nausea/vomiting,abdominal pain, uncontrolled pain, chest pain,  shortness of breath or any other alarming symptoms.  Please follow-up your doctor as instructed in a week time and call the office for appointment.  Please avoid alcohol, smoking, or any other illicit substance and maintain healthy habits including taking your regular medications as prescribed.  You were cared for by a hospitalist during your hospital stay. If you have any questions about your discharge medications or the care you received while you were in the hospital after you are discharged, you can call the unit and ask to speak with the hospitalist on call if the hospitalist that took care of you is not available.  Once you are discharged, your primary care physician will handle any further medical issues. Please note that NO REFILLS for any discharge medications will be authorized once you are discharged, as it is imperative that you return to your primary care physician (or establish a relationship with a primary care physician if you do not have one) for your aftercare needs so that they can reassess your need for medications and monitor your lab values   Discharge wound care:   Complete by: As directed    Starting post op day 2L  Remove bandage and start 15 min soaks in warm soapy water 3 times per day followed by application of new clean and dry dressing. Bandage should be a nonstick layer followed by dry gauze and a light wrap Work on finger ROM and elevation as much as possible Take antibiotics as prescribed   F/u 1 week for wound check in Dr Greta Doom office- his team will call to schedule this   Increase activity slowly   Complete by: As directed       Allergies as of 03/07/2022       Reactions   Atorvastatin    Hands numb and tingly    Ciprofloxacin Diarrhea   Doxycycline Hyclate Nausea And Vomiting   Gabapentin Other (See Comments)   Caused upset stomach   Keflex [cephalexin] Other (See Comments)    diarrhea   Lisinopril Other (See Comments)   "Made hands cramp"   Oxycodone Nausea And Vomiting   Tolerates hydrocodone    Penicillins Hives, Other (See Comments)   Has patient had a PCN reaction causing immediate rash, facial/tongue/throat swelling, SOB or lightheadedness with hypotension: Unknown Has patient had a PCN reaction causing severe rash involving mucus membranes or skin necrosis: Unknown Has patient had a PCN reaction that required hospitalization: Unknown Has patient had a PCN reaction occurring within the last 10 years: No If all of the above answers are "NO", then may proceed with Cephalosporin use.   Pepcid [famotidine] Other (See Comments)   HEADACHE        Medication List     STOP taking these medications    traMADol 50 MG tablet Commonly known as: ULTRAM       TAKE these medications    amLODipine 5 MG tablet Commonly known as: NORVASC Take 5 mg by mouth daily.   apixaban 5 MG Tabs tablet Commonly known as: ELIQUIS Take 1 tablet (5 mg total) by mouth 2 (two) times daily.   BD Pen Needle Nano 2nd Gen 32G X 4 MM Misc Generic drug: Insulin  Pen Needle   cefdinir 300 MG capsule Commonly known as: OMNICEF Take 1 capsule (300 mg total) by mouth 2 (two) times daily for 5 days.   cetirizine 10 MG tablet Commonly known as: ZYRTEC Take 10 mg by mouth daily.   clonazePAM 1 MG tablet Commonly known as: KLONOPIN Take 0.5 mg by mouth daily as needed for anxiety.   diclofenac Sodium 1 % Gel Commonly known as: VOLTAREN Apply 1 application. topically 4 (four) times daily as needed (pain).   estradiol 0.1 MG/GM vaginal cream Commonly known as: ESTRACE Place 1 Applicatorful vaginally 2 (two) times a week.   FLUoxetine 20 MG capsule Commonly known as: PROZAC Take 20 mg by mouth daily.   glucose blood test strip Commonly known as: Electronics engineer BLOOD SUGAR 3 TIMES DAILY-DX-E11.9 AND Z79.4.   hydrochlorothiazide 25 MG tablet Commonly known as:  HYDRODIURIL Take 25 mg by mouth daily.   HYDROcodone-acetaminophen 5-325 MG tablet Commonly known as: NORCO/VICODIN Take 1-2 tablets by mouth every 8 (eight) hours as needed for up to 12 doses for moderate pain.   linezolid 600 MG tablet Commonly known as: ZYVOX Take 1 tablet (600 mg total) by mouth every 12 (twelve) hours for 5 days.   losartan 50 MG tablet Commonly known as: COZAAR Take 1 tablet (50 mg total) by mouth daily. What changed: when to take this   metoprolol succinate 25 MG 24 hr tablet Commonly known as: TOPROL-XL Take 1 tablet (25 mg total) by mouth daily. What changed: when to take this   NovoLOG FlexPen 100 UNIT/ML FlexPen Generic drug: insulin aspart Inject 3-5 Units into the skin See admin instructions. 3 units at breakfast, 3 units at lunch, and 5 units at evening meal   omeprazole 20 MG capsule Commonly known as: PRILOSEC Take 20 mg by mouth daily.   ondansetron 4 MG tablet Commonly known as: Zofran Take 1 tablet (4 mg total) by mouth every 8 (eight) hours as needed for nausea or vomiting.   OneTouch Delica Lancets 20U Misc Check blood sugar 3 times daily-DX-E11.9 and Z79.4.   OneTouch Verio w/Device Kit 1 kit by Does not apply route 3 (three) times daily.   pregabalin 150 MG capsule Commonly known as: LYRICA TAKE ONE CAPSULE BY MOUTH AT BEDTIME What changed: when to take this   Systane 0.4-0.3 % Soln Generic drug: Polyethyl Glycol-Propyl Glycol Place 1 drop into both eyes 2 (two) times daily.   Tyler Aas FlexTouch 100 UNIT/ML FlexTouch Pen Generic drug: insulin degludec Inject 10-12 Units into the skin daily.   triamcinolone 55 MCG/ACT Aero nasal inhaler Commonly known as: NASACORT Place 1 spray into the nose daily as needed (allergies).   Vitamin D-3 125 MCG (5000 UT) Tabs Take 5,000 tablets by mouth every evening.               Discharge Care Instructions  (From admission, onward)           Start     Ordered   03/07/22  0000  Discharge wound care:       Comments: Starting post op day 2L  Remove bandage and start 15 min soaks in warm soapy water 3 times per day followed by application of new clean and dry dressing. Bandage should be a nonstick layer followed by dry gauze and a light wrap Work on finger ROM and elevation as much as possible Take antibiotics as prescribed   F/u 1 week for wound check in Dr Greta Doom office- his team  will call to schedule this   03/07/22 1058            Allergies  Allergen Reactions   Atorvastatin     Hands numb and tingly    Ciprofloxacin Diarrhea   Doxycycline Hyclate Nausea And Vomiting   Gabapentin Other (See Comments)    Caused upset stomach   Keflex [Cephalexin] Other (See Comments)    diarrhea   Lisinopril Other (See Comments)    "Made hands cramp"   Oxycodone Nausea And Vomiting    Tolerates hydrocodone    Penicillins Hives and Other (See Comments)    Has patient had a PCN reaction causing immediate rash, facial/tongue/throat swelling, SOB or lightheadedness with hypotension: Unknown Has patient had a PCN reaction causing severe rash involving mucus membranes or skin necrosis: Unknown Has patient had a PCN reaction that required hospitalization: Unknown Has patient had a PCN reaction occurring within the last 10 years: No If all of the above answers are "NO", then may proceed with Cephalosporin use.    Pepcid [Famotidine] Other (See Comments)    HEADACHE    The results of significant diagnostics from this hospitalization (including imaging, microbiology, ancillary and laboratory) are listed below for reference.    Microbiology: Recent Results (from the past 240 hour(s))  Aerobic/Anaerobic Culture w Gram Stain (surgical/deep wound)     Status: None (Preliminary result)   Collection Time: 03/05/22  3:21 PM   Specimen: PATH Other; Tissue  Result Value Ref Range Status   Specimen Description   Final    TISSUE Performed at [facility], [place] [address] [place], [place] [address]    Special Requests LEFT INDEX FINGER  Final   Gram Stain   Final    RARE WBC PRESENT, PREDOMINANTLY MONONUCLEAR NO ORGANISMS SEEN    Culture   Final    NO GROWTH < 24 HOURS Performed at [facility], [place] [address] [place], [place] [address]    Report Status PENDING  Incomplete    Procedures/Studies: DG Hand Complete Left  Result Date: 03/04/2022 CLINICAL DATA:  Trauma, pain and swelling EXAM: LEFT HAND - COMPLETE 3+ VIEW COMPARISON:  None Available. FINDINGS: No fracture or dislocation is seen. There are no radiopaque foreign bodies. Small cystic foci seen in the lunate may be due to degenerative arthritis. IMPRESSION: No fracture or dislocation is seen in the left hand. There are no radiopaque foreign bodies. Electronically Signed   By: Elmer Picker M.D.   On: 03/04/2022 16:47    Labs: BNP (last 3 results) No results for input(s): "BNP" in the last 8760 hours. Basic Metabolic Panel: Recent Labs  Lab 03/04/22 1655 03/05/22 0430 03/06/22 0738  NA 136 139 140  K 5.7* 4.4 4.9  CL 100 107 105  CO2 _0 GLUCOSE 364* 204* 164*  BUN 25* 19 17  CREATININE 1.22* 0.99 1.13*  CALCIUM 9.3 8.5* 9.1  MG  --  1.9  --   PHOS  --  2.7  --    Liver Function Tests: Recent Labs  Lab 03/05/22 0430  AST 21  21  ALT 14  15  ALKPHOS 119  119  BILITOT 0.6  0.5  PROT 6.5  6.6  ALBUMIN 2.9*  3.0*   No results for input(s): "LIPASE", "AMYLASE" in the last 168 hours. No results for input(s): "AMMONIA" in the last 168 hours. CBC: Recent Labs  Lab 03/04/22 1655 03/05/22 0430 03/06/22 0738  WBC 13.8* 16.9*  10.9*  NEUTROABS 8.6*  --   --   HGB 12.7 11.2* 12.1  HCT 41.0 36.6 39.3  MCV 82.2 81.7 82.0  PLT 402* 344 372   Cardiac Enzymes: Recent Labs  Lab 03/05/22 0430  CKTOTAL 88   BNP: Invalid input(s): "POCBNP" CBG: Recent Labs  Lab 03/06/22 1945 03/06/22 2347 03/07/22 0306 03/07/22 0734  03/07/22 1132  GLUCAP 144* 147* 146* 140* 258*   D-Dimer No results for input(s): "DDIMER" in the last 72 hours. Hgb A1c Recent Labs    03/05/22 0430  HGBA1C 9.8*   Lipid Profile No results for input(s): "CHOL", "HDL", "LDLCALC", "TRIG", "CHOLHDL", "LDLDIRECT" in the last 72 hours. Thyroid function studies No results for input(s): "TSH", "T4TOTAL", "T3FREE", "THYROIDAB" in the last 72 hours.  Invalid input(s): "FREET3" Anemia work up No results for input(s): "VITAMINB12", "FOLATE", "FERRITIN", "TIBC", "IRON", "RETICCTPCT" in the last 72 hours. Urinalysis    Component Value Date/Time   COLORURINE STRAW (A) 11/18/2019 1316   APPEARANCEUR CLEAR 11/18/2019 1316   LABSPEC 1.016 11/18/2019 1316   PHURINE 6.0 11/18/2019 1316   GLUCOSEU >=500 (A) 11/18/2019 1316   HGBUR MODERATE (A) 11/18/2019 1316   BILIRUBINUR NEGATIVE 11/18/2019 1316   KETONESUR NEGATIVE 11/18/2019 1316   PROTEINUR 100 (A) 11/18/2019 1316   UROBILINOGEN 0.2 12/07/2011 1031   NITRITE NEGATIVE 11/18/2019 1316   LEUKOCYTESUR NEGATIVE 11/18/2019 1316   Sepsis Labs Recent Labs  Lab 03/04/22 1655 03/05/22 0430 03/06/22 0738  WBC 13.8* 16.9* 10.9*   Microbiology Recent Results (from the past 240 hour(s))  Aerobic/Anaerobic Culture w Gram Stain (surgical/deep wound)     Status: None (Preliminary result)   Collection Time: 03/05/22  3:21 PM   Specimen: PATH Other; Tissue  Result Value Ref Range Status   Specimen Description   Final    TISSUE Performed at [facility], [place] [address], [place], [place] [address]    Special Requests LEFT INDEX FINGER  Final   Gram Stain   Final    RARE WBC PRESENT, PREDOMINANTLY MONONUCLEAR NO ORGANISMS SEEN    Culture   Final    NO GROWTH < 24 HOURS Performed at [facility], [place] [address], [place], [place] [address]    Report Status PENDING  Incomplete     Time coordinating discharge: 25 minutes  SIGNED: Antonieta Pert, MD  [facility] 03/07/2022, 12:02 PM  If 7PM-7AM, please contact night-coverage [email or web address]

## 2022-03-06 NOTE — Discharge Instructions (Signed)
Starting Postop Day #2:  Remove bandage and start 15 min soaks in warm soapy water 3 times per day followed by application of new clean and dry dressing. Bandage should be a nonstick layer followed by dry gauze and a light wrap Work on finger ROM and elevation as much as possible Take antibiotics as prescribed  F/u 1 week for wound check in my office- my team will call to schedule this  J. Sable Feil, MD Orthopaedic Hand Surgeon [facility] Office number: [phone] [address] [place], [place] [address]

## 2022-03-06 NOTE — Progress Notes (Signed)
Pharmacy Antibiotic Note  [name] is a 66 y.o. female who presented to the ED on 03/04/22,  w/ complaints of hand swelling and pain s/p puncture wound on hand from last week. Patient was taking cephalexin from her PCP for the injury w/out improvement. Left hand X-ray on 03/04/22 shows no dislocation or fracture. Patient reported chills but no fever. On 03/05/22 patient underwent I&D of left index finger. She is currently being treated w/ vancomycin and cefepime.  Today, 03/06/22: - Day 2 of abx - WBC elevated but trending down - afebrile  - Left index finger culture remains negative thus far - Scr up 1.13 (CrCl: 42)  Plan: - Continue cefepime at 2 g Q12H - Vancomycin will be adjusted to 1250 mg IV Q48H for estimated AUC of 529 - Monitor renal function and Cx results  Height: 5' (152.4 cm) Weight: 72.6 kg (160 lb) IBW/kg (Calculated) : 45.5  Temp (24hrs), Avg:98.4 F (36.9 C), Min:97.9 F (36.6 C), Max:99.3 F (37.4 C)  Recent Labs  Lab 03/04/22 1655 03/05/22 0430  WBC 13.8* 16.9*  CREATININE 1.22* 0.99    Estimated Creatinine Clearance: 48.3 mL/min (by C-G formula based on SCr of 0.99 mg/dL).    Allergies  Allergen Reactions   Atorvastatin     Hands numb and tingly    Ciprofloxacin Diarrhea   Doxycycline Hyclate Nausea And Vomiting   Gabapentin Other (See Comments)    Caused upset stomach   Keflex [Cephalexin] Other (See Comments)    diarrhea   Lisinopril Other (See Comments)    "Made hands cramp"   Oxycodone Nausea And Vomiting    Tolerates hydrocodone    Penicillins Hives and Other (See Comments)    Has patient had a PCN reaction causing immediate rash, facial/tongue/throat swelling, SOB or lightheadedness with hypotension: Unknown Has patient had a PCN reaction causing severe rash involving mucus membranes or skin necrosis: Unknown Has patient had a PCN reaction that required hospitalization: Unknown Has patient had a PCN reaction occurring within the last 10  years: No If all of the above answers are "NO", then may proceed with Cephalosporin use.    Pepcid [Famotidine] Other (See Comments)    HEADACHE    Antimicrobials this admission: 6/13 Cefepime>> 6/13 Vancomycin >>   Microbiology results: - 03/05/22 left index finger tissue culture: NGTD  Thank you for allowing pharmacy to be a part of this patient's care.  Barnet Pall 03/06/2022 7:41 AM

## 2022-03-06 NOTE — TOC Benefit Eligibility Note (Signed)
Patient Research scientist (life sciences) completed.     The patient is currently admitted and upon discharge could be taking linezolid 600 mg.   The current 30 day co-pay is, $16.   The patient is insured through advance script.

## 2022-03-06 NOTE — Plan of Care (Signed)
Uneventful day, c/o LUE pain, managed with multiple doses of PRN opioid. Ambulatory saturation obtained.  Sats at rest on RA = 98% Sats with exertion on RA = 92%, briefly dropped to 88% but recovered without intervention.  Problem: Education: Goal: Ability to describe self-care measures that may prevent or decrease complications (Diabetes Survival Skills Education) will improve Outcome: Progressing Goal: Individualized Educational Video(s) Outcome: Progressing

## 2022-03-06 NOTE — Hospital Course (Addendum)
66 y.o. female with PMH significant for DM2, HTN, HLD, CHF, A-fib, gout, neuropathy, arthritis, history of splenectomy patient presented to the ED with complaint of progressive worsening of left hand.  She apparently punctured her hand on a rose bush last week and was seen by her PCP, started on oral Keflex despite which she continued to have progressive swelling and redness and hence presented to the ED. In the ED, patient was afebrile, blood pressure elevated to 141/72 Labs showed potassium elevated to 5.6, glucose level elevated 364, creatinine elevated 1.22, WBC count elevated to 13.8 X-ray unremarkable for bony abnormality or foreign body. Started on IV cefepime and vancomycin. Admitted to hospitalist service. Patient was seen by Dr. Barnie Alderman left index finger flexor tendon sheath incision and drainage, left index finger incision and drainage of dorsal abscess. Patient was monitored overnight.  Wound culture rare WBC no organisms.  She has been afebrile leukocytosis resolved, discussed with Dr. Greta Doom, who has cleared the patient for discharge and he will follow-up as outpatient for postop care discussed with pharmacy will discharge on Zyvox/cefpodoxime for ongoing wound care management.  Sediments medically stable for discharge blood sugar has stabilized.

## 2022-03-06 NOTE — Progress Notes (Signed)
  Transition of Care [facility]) Screening Note   Patient Details  Name: [name] Date of Birth: July 23, 1956   Transition of Care [facility]) CM/SW Contact:    Vassie Moselle, LCSW Phone Number: 03/06/2022, 10:03 AM    Transition of Care Department [facility]) has reviewed patient and no TOC needs have been identified at this time. We will continue to monitor patient advancement through interdisciplinary progression rounds. If new patient transition needs arise, please place a TOC consult.

## 2022-03-06 NOTE — Progress Notes (Incomplete)
Introduction: 54 YOWF who presented on 03/04/22 w/ hand pain and swelling   History of present illness: Patient presented w/ hand swelling and pain post puncture wound by rose thorn last week. Patient's PCP gave her Keflex QID to no improvement. Since admit patient has been treated with vancomycin and cefepime for presumed cellulitis and has undergone I&D at base of left index finger on 03/05/22.   As of 03/05/22 patient was documented to be resting w/out acute distress.   Past medical history/allergies/family history pertinent to current illness:  Allergies  Allergen Reactions   Atorvastatin     Hands numb and tingly    Ciprofloxacin Diarrhea   Doxycycline Hyclate Nausea And Vomiting   Gabapentin Other (See Comments)    Caused upset stomach   Keflex [Cephalexin] Other (See Comments)    diarrhea   Lisinopril Other (See Comments)    "Made hands cramp"   Oxycodone Nausea And Vomiting    Tolerates hydrocodone    Penicillins Hives and Other (See Comments)    Has patient had a PCN reaction causing immediate rash, facial/tongue/throat swelling, SOB or lightheadedness with hypotension: Unknown Has patient had a PCN reaction causing severe rash involving mucus membranes or skin necrosis: Unknown Has patient had a PCN reaction that required hospitalization: Unknown Has patient had a PCN reaction occurring within the last 10 years: No If all of the above answers are "NO", then may proceed with Cephalosporin use.    Pepcid [Famotidine] Other (See Comments)    HEADACHE    - Hx of T2DM, Afib, HTN, CKD IIIa, CHF, gout, neuropathy, PVD, lymphadenopathy, chronic pain, malignant cervix tumor - Hx of splenectomy, appendectomy, lymph node excision  PTA meds: - Apixaban 5 mg tabs BID - Losartan 50 mg tab BID - Metoprolol succinate 25 mg XL tab BID  - Pregabalin 150 mg cap QPM - amlodipine 5 mg tab daily - Cetirizine 10 mg tab daily - Vitamin D3 5000 U tab QPM - Clonazepam 0.5 mg daily PRN for  anxiety - Estradiol vaginal cream 0.1 mg/g applied Tuesdays and Thursdays - Fluoxetine 20 mg cap once daily - Insulin aspart 100U/mL 3U w/ breakfast, 3U at lunch, 5U at dinner - Insulin degludec 100 U/mL pen 10-12 U SQ daily - Omeprazole 20 mg cap daily - Systane 0.4-0.3% soln 1 drop in both eyes BID - Tramadol 50 mg daily PRN for pain Triamcinolone 55 mcg/act nasal inhaler 1 spray daily PRN for allergies Social hx: - former smoker Physical exam/relevant labs, diagnostic testing/imaging: -   Problem based assessment and plan:  - Problem 1: Redness and Swelling of Left Hand/Cellulitis  A. Assessment: Patient is currently being treated for bacterial cellulitis w/ vancomycin 1250 mg Q48H and cefepime 2 g Q12H. She  also undergone I&D, During I&D left index finger tissue culture was taken, there is NGTF. Patient is currently afebrile w/ downtrending elevated WBC (10.9), indicating the absence of major systemic response to infxn. Hand X-ray on 03/04/22 shows no fractures or dislocations.Patient's renal function trending up at 1.13 today compared to 0.99 yesterday. Hx of splenectomy and diabetes may have resulted in diminished immune function.   B. Plan: - Continue vancomycin 1250 mg Q48H - Continue cefepime 2 g Q12H - Monitor renal function - Monitor Cx results - Monitor erythema and swelling at site - Monitor CBC daily - Obtain vancomycin levels at steady state  - Problem 2: Afib  A. Assessment: Patient HR steady in 70's and in NSR. BP 120's/90's. Patient's Afib stroke  risk treated PTA w/ Eliquis 5 mg BID Currently treated w/ metoprolol succinate 25 mg XL tab BID.  B. Plan: - D/c metoprolol succinate XL tab 25 mg BID - Initiate metoprolol succinate 50 mg XL tab daily  - Problem 3:  A. Assessment:  B. Plan:

## 2022-03-07 ENCOUNTER — Other Ambulatory Visit (HOSPITAL_COMMUNITY): Payer: Self-pay

## 2022-03-07 ENCOUNTER — Ambulatory Visit: Payer: Medicare Other | Admitting: Podiatry

## 2022-03-07 DIAGNOSIS — L03114 Cellulitis of left upper limb: Secondary | ICD-10-CM | POA: Diagnosis not present

## 2022-03-07 LAB — GLUCOSE, CAPILLARY
Glucose-Capillary: 146 mg/dL — ABNORMAL HIGH (ref 70–99)
Glucose-Capillary: 140 mg/dL — ABNORMAL HIGH (ref 70–99)
Glucose-Capillary: 258 mg/dL — ABNORMAL HIGH (ref 70–99)

## 2022-03-07 MED ORDER — LINEZOLID 600 MG PO TABS
600.0000 mg | ORAL_TABLET | Freq: Two times a day (BID) | ORAL | 0 refills | Status: AC
  Filled 2022-03-07: qty 10, 5d supply, fill #0

## 2022-03-07 MED ORDER — HYDROCODONE-ACETAMINOPHEN 5-325 MG PO TABS
1.0000 | ORAL_TABLET | Freq: Three times a day (TID) | ORAL | 0 refills | Status: DC | PRN
  Filled 2022-03-07: qty 12, 3d supply, fill #0

## 2022-03-07 MED ORDER — CEFPODOXIME PROXETIL 200 MG PO TABS
400.0000 mg | ORAL_TABLET | Freq: Two times a day (BID) | ORAL | Status: DC
  Administered 2022-03-07: 400 mg via ORAL
  Filled 2022-03-07: qty 2

## 2022-03-07 MED ORDER — CEFPODOXIME PROXETIL 200 MG PO TABS
400.0000 mg | ORAL_TABLET | Freq: Two times a day (BID) | ORAL | 0 refills | Status: DC
  Filled 2022-03-07: qty 20, 5d supply, fill #0

## 2022-03-07 MED ORDER — LINEZOLID 600 MG PO TABS
600.0000 mg | ORAL_TABLET | Freq: Two times a day (BID) | ORAL | Status: DC
  Administered 2022-03-07: 600 mg via ORAL
  Filled 2022-03-07: qty 1

## 2022-03-07 MED ORDER — CEFDINIR 300 MG PO CAPS
300.0000 mg | ORAL_CAPSULE | Freq: Two times a day (BID) | ORAL | 0 refills | Status: AC
  Filled 2022-03-07: qty 10, 5d supply, fill #0

## 2022-03-07 NOTE — Progress Notes (Signed)
Went over discharge instructions w/ pt. Pt verbalized understanding.  

## 2022-03-10 LAB — AEROBIC/ANAEROBIC CULTURE W GRAM STAIN (SURGICAL/DEEP WOUND): Culture: NO GROWTH

## 2022-03-18 ENCOUNTER — Encounter: Payer: Medicare Other | Admitting: Nutrition

## 2022-03-18 ENCOUNTER — Encounter (HOSPITAL_COMMUNITY): Payer: Self-pay | Admitting: Orthopedic Surgery

## 2022-03-19 ENCOUNTER — Ambulatory Visit: Payer: Medicare Other | Admitting: Nutrition

## 2022-03-20 ENCOUNTER — Encounter (INDEPENDENT_AMBULATORY_CARE_PROVIDER_SITE_OTHER): Payer: Medicare Other | Admitting: Ophthalmology

## 2022-03-20 DIAGNOSIS — E113313 Type 2 diabetes mellitus with moderate nonproliferative diabetic retinopathy with macular edema, bilateral: Secondary | ICD-10-CM

## 2022-03-20 DIAGNOSIS — H43813 Vitreous degeneration, bilateral: Secondary | ICD-10-CM

## 2022-04-26 ENCOUNTER — Other Ambulatory Visit: Payer: Self-pay | Admitting: Podiatry

## 2022-04-26 NOTE — Telephone Encounter (Signed)
Please advise 

## 2022-05-02 ENCOUNTER — Ambulatory Visit (INDEPENDENT_AMBULATORY_CARE_PROVIDER_SITE_OTHER): Payer: Medicare Other | Admitting: Podiatry

## 2022-05-02 ENCOUNTER — Ambulatory Visit (INDEPENDENT_AMBULATORY_CARE_PROVIDER_SITE_OTHER): Payer: Medicare Other

## 2022-05-02 DIAGNOSIS — L03032 Cellulitis of left toe: Secondary | ICD-10-CM | POA: Diagnosis not present

## 2022-05-02 DIAGNOSIS — L02612 Cutaneous abscess of left foot: Secondary | ICD-10-CM

## 2022-05-02 MED ORDER — SILVER SULFADIAZINE 1 % EX CREA: 1.0000 | TOPICAL_CREAM | Freq: Every day | CUTANEOUS | 0 refills | Status: DC

## 2022-05-02 MED ORDER — SULFAMETHOXAZOLE-TRIMETHOPRIM 800-160 MG PO TABS: 1.0000 | ORAL_TABLET | Freq: Two times a day (BID) | ORAL | 0 refills | Status: DC

## 2022-05-04 NOTE — Progress Notes (Signed)
Subjective: Chief Complaint  Patient presents with   Blister    Left great to blister x 2 days. Pt requested for debridement of     66 year old female presents the office with concerns of a blister to the left big toe which started about 2 days ago.  She has not seen no swelling or redness to the area but no drainage or pus.  She is not sure how this started.  She has no fevers or chills that she reports and she has no other concerns today.  No injuries that she reports.  Objective: AAO x3, NAD DP/PT pulses palpable bilaterally, CRT less than 3 seconds Blister present to the distal aspect left hallux with some macerated tissue.  As able to debride some of the loose tissue to this granular wound base without any probing, amount or tunneling.  There is localized edema erythema to the hallux but there is some erythema on the dorsal aspect of the foot.  There is no fluctuation or crepitation.  There is no malodor. No pain with calf compression, swelling, warmth, erythema       Assessment: Left hallux blister, infection  Plan: -All treatment options discussed with the patient including all alternatives, risks, complications.  -X-rays were obtained and reviewed of the left foot.  3 views were obtained.  There is no evidence of acute fracture.  No definitive evidence of acute osteomyelitis.  No soft tissue emphysema. -I did debride some of the loose skin today without any complications or bleeding.  Betadine was applied followed by dressing.  Continue daily dressing changes.  Prescribed Silvadene to apply. -Surgical shoe for offloading which she has -Bactrim -ABI ordered -Monitor for any clinical signs or symptoms of infection and directed to call the office immediately should any occur or go to the ER. -Patient encouraged to call the office with any questions, concerns, change in symptoms.   RTC 1 week or sooner if needed.   Trula Slade DPM

## 2022-05-07 ENCOUNTER — Telehealth: Payer: Self-pay | Admitting: *Deleted

## 2022-05-07 NOTE — Telephone Encounter (Signed)
No prior authorization for study [id number], have notified V&V that patient is ok for appointment.

## 2022-05-07 NOTE — Telephone Encounter (Signed)
No prior authorization for cpt code :[id number],DIX # is [id number],QKSKSHN person: [name] Authorization date is 05/07/22.

## 2022-05-08 ENCOUNTER — Ambulatory Visit (HOSPITAL_COMMUNITY)
Admission: RE | Admit: 2022-05-08 | Discharge: 2022-05-08 | Disposition: A | Discharge status: Home / Self Care | Payer: Medicare Other | Source: Ambulatory Visit | Attending: Podiatry | Admitting: Podiatry

## 2022-05-08 DIAGNOSIS — L03032 Cellulitis of left toe: Secondary | ICD-10-CM | POA: Diagnosis present

## 2022-05-08 DIAGNOSIS — L02612 Cutaneous abscess of left foot: Secondary | ICD-10-CM

## 2022-05-13 ENCOUNTER — Ambulatory Visit (INDEPENDENT_AMBULATORY_CARE_PROVIDER_SITE_OTHER): Payer: Medicare Other | Admitting: Podiatry

## 2022-05-13 DIAGNOSIS — L03032 Cellulitis of left toe: Secondary | ICD-10-CM

## 2022-05-13 DIAGNOSIS — L02612 Cutaneous abscess of left foot: Secondary | ICD-10-CM

## 2022-05-13 DIAGNOSIS — L97521 Non-pressure chronic ulcer of other part of left foot limited to breakdown of skin: Secondary | ICD-10-CM

## 2022-05-16 NOTE — Progress Notes (Signed)
Subjective: Chief Complaint  Patient presents with   Diabetic Ulcer    Pt came in for a wound check on the left foot, wound does have some drainage, patient rates the pain a 4 out of 10, seen by [facility], A1c- 10 BG-78   66 year old female presents the above complaints.  She states that she is doing better and the swelling and redness has improved.  Still the antibiotic's.  She is continuing daily dressing changes.  Denies any fevers or chills.   Objective: AAO x3, NAD DP/PT pulses palpable bilaterally, CRT less than 3 seconds Granulation tissue present which is superficial the distal aspect left hallux where the blister skin had been removed.  There is decreased edema and erythema.  There is no drainage or pus noted today and there is no fluctuation or crepitation.  There is no malodor.  Pictures below. No pain with calf compression, swelling, warmth, erythema          Assessment: Left hallux blister, infection-improvement  Plan: -All treatment options discussed with the patient including all alternatives, risks, complications.  -I did debride some of the loose skin today without any complications or bleeding.  I want her to continue with daily dressing changes with a small mount of Silvadene.  She is not able to wear the surgical shoe as it was rubbing a blister in her ankle which is no longer present today.  Elevation. -Reviewed arterial studies with her. -Monitor for any clinical signs or symptoms of infection and directed to call the office immediately should any occur or go to the ER.  Trula Slade DPM

## 2022-05-28 ENCOUNTER — Ambulatory Visit (INDEPENDENT_AMBULATORY_CARE_PROVIDER_SITE_OTHER): Payer: Medicare Other | Admitting: Podiatry

## 2022-05-28 DIAGNOSIS — L02612 Cutaneous abscess of left foot: Secondary | ICD-10-CM | POA: Diagnosis not present

## 2022-05-28 DIAGNOSIS — L03032 Cellulitis of left toe: Secondary | ICD-10-CM | POA: Diagnosis not present

## 2022-05-28 DIAGNOSIS — L97521 Non-pressure chronic ulcer of other part of left foot limited to breakdown of skin: Secondary | ICD-10-CM | POA: Diagnosis not present

## 2022-05-29 ENCOUNTER — Encounter (INDEPENDENT_AMBULATORY_CARE_PROVIDER_SITE_OTHER): Payer: Medicare Other | Admitting: Ophthalmology

## 2022-05-29 DIAGNOSIS — E113313 Type 2 diabetes mellitus with moderate nonproliferative diabetic retinopathy with macular edema, bilateral: Secondary | ICD-10-CM

## 2022-05-29 DIAGNOSIS — H43813 Vitreous degeneration, bilateral: Secondary | ICD-10-CM

## 2022-06-04 NOTE — Progress Notes (Signed)
Subjective: Chief Complaint  Patient presents with   Cellulitis    Rm 2 week follow up right cellulitis and abscess of left foot. There is some improvement noted. Pt complains of minimum drainage.    66 year old female presents the office today with above concerns.  States that she is doing better.  Swelling the redness is improved.  She still getting some bloody drainage at times.  This is worse when she hits the toe.  No fevers or chills.  No other concerns.   Objective: AAO x3, NAD DP/PT pulses palpable bilaterally, CRT less than 3 seconds Granulation tissue present which is superficial the distal aspect left hallux where the blister skin had been removed.  Small fibrotic tissue present at the toe.  There is no probing, or tunneling.  Decreased edema and erythema.  See pictures below. No pain with calf compression, swelling, warmth, erythema            Assessment: Left hallux blister, infection-improvement  Plan: -All treatment options discussed with the patient including all alternatives, risks, complications.  -Debrided some of the loose tissue without any complications or bleeding today. -Overall infection seems to be improving.  Continue to monitor for any reoccurrence. -Continue daily dressing changes.  She has been using Silvadene.  Discussed Maxorb to help absorb any drainage. -Monitor for any clinical signs or symptoms of infection and directed to call the office immediately should any occur or go to the ER.  Trula Slade DPM

## 2022-06-13 ENCOUNTER — Ambulatory Visit (INDEPENDENT_AMBULATORY_CARE_PROVIDER_SITE_OTHER): Payer: Medicare Other | Admitting: Podiatry

## 2022-06-13 DIAGNOSIS — E1149 Type 2 diabetes mellitus with other diabetic neurological complication: Secondary | ICD-10-CM

## 2022-06-13 DIAGNOSIS — L97521 Non-pressure chronic ulcer of other part of left foot limited to breakdown of skin: Secondary | ICD-10-CM | POA: Diagnosis not present

## 2022-06-13 NOTE — Progress Notes (Signed)
Subjective:  Patient ID: [name], female    DOB: [birth year],  MRN: [id number]  Chief Complaint  Patient presents with   Foot Problem    Cellulitis     66 y.o. female presents for follow-up of left hallux ulceration.  She has been daily doing daily wound care per Dr. Leigh Aurora instructions.  Has been using an absorptive dressing. feels that the wound is improving.  Has not seen any increase in redness or swelling of the left great toe.  Denies any drainage from the wound site.  Denies any nausea vomiting fever chills.  Past Medical History:  Diagnosis Date   Anxiety    Arthritis    Cancer ([place]) 1977   CERVICAL   CHF (congestive heart failure) ([place])    pt. denies   Diabetes mellitus without complication ([place])    type 2   Dysrhythmia    Afib   GERD (gastroesophageal reflux disease) 11/14/2013   Gout    Hypertension    Mixed hyperlipidemia 11/14/2013   Neuromuscular disorder ([facility])    neuropathy feet   Pneumonia     Allergies  Allergen Reactions   Atorvastatin     Hands numb and tingly    Ciprofloxacin Diarrhea   Doxycycline Hyclate Nausea And Vomiting   Gabapentin Other (See Comments)    Caused upset stomach   Keflex [Cephalexin] Other (See Comments)    diarrhea   Lisinopril Other (See Comments)    "Made hands cramp"   Oxycodone Nausea And Vomiting    Tolerates hydrocodone    Penicillins Hives and Other (See Comments)    Has patient had a PCN reaction causing immediate rash, facial/tongue/throat swelling, SOB or lightheadedness with hypotension: Unknown Has patient had a PCN reaction causing severe rash involving mucus membranes or skin necrosis: Unknown Has patient had a PCN reaction that required hospitalization: Unknown Has patient had a PCN reaction occurring within the last 10 years: No If all of the above answers are "NO", then may proceed with Cephalosporin use.    Pepcid [Famotidine] Other (See Comments)    HEADACHE    ROS: Negative except as per  HPI above  Objective:  General: AAO x3, NAD  Dermatological: Granulation tissue present which is superficial the distal aspect left hallux.  Increased new healthy appearing dermis formation present surrounding the wound with decreasing in size.  small fibrotic and hyperkeratotic tissue present surrounding the ulceration.  There is no probing, or tunneling.  Decreased/stable edema and erythema.  See pre and post debridement pictures below.  Vascular:  Dorsalis Pedis artery and Posterior Tibial artery pedal pulses are 2/4 bilateral.  Capillary fill time brisk < 3 sec.   Neruologic: Grossly  diminished Protective threshold diminished to all sites bilateral.   Musculoskeletal: No gross boney pedal deformities bilateral. No pain, crepitus, or limitation noted with foot and ankle range of motion bilateral. Muscular strength 5/5 in all groups tested bilateral.  Gait: Unassisted, Nonantalgic.       Radiographs:  Deferred at this visit wound is very superficial with no sign of worsening infection Assessment:   1. Toe ulcer, left, limited to breakdown of skin ([place])   2. Type II diabetes mellitus with neurological manifestations [facility])      Plan:  Patient was evaluated and treated and all questions answered.  #Ulceration of distal tuft of left hallux, improving limited to breakdown of skin secondary to type 2 diabetes with neuropathy -Continue with local wound care as she has been doing for  the past 2 weeks. -Provided new absorptive dressing to the patient to use as the base layer for her dressing. -Do not recommend need for antibiotic therapy at this time as no evidence of worsening infection and wound is very clean and healthy -Debrided the wound as below -Return for follow-up in 2 weeks for ongoing wound care. -Call with any questions or concerns or if redness swelling or drainage worsen from the wound site.  Continue to offload the wound as able  Procedure: Excisional Debridement of  Wound Rationale: Removal of non-viable soft tissue from the wound to promote healing.  Anesthesia: None required due to neuropathy  Post-Debridement Wound Measurements: 0.7 cm x 0.5 cm x 0.1 cm  Type of Debridement: Excisional with 10 blade scalpel Tissue Removed: Non-viable soft tissue Depth of Debridement: Superficial limited to skin only Instrumentation: 10 blade scalpel  Technique: Sharp excisional debridement to bleeding, viable wound base.  Dressing: Dry, sterile, compression dressing. Disposition: Patient tolerated procedure well. Patient to return in 1-2 week for follow-up.   Return in about 2 weeks (around 06/27/2022) for Follow up left hallux ulcer.          Everitt Amber, DPM [facility]

## 2022-06-25 ENCOUNTER — Ambulatory Visit (INDEPENDENT_AMBULATORY_CARE_PROVIDER_SITE_OTHER): Payer: Medicare Other | Admitting: Podiatry

## 2022-06-25 DIAGNOSIS — E1149 Type 2 diabetes mellitus with other diabetic neurological complication: Secondary | ICD-10-CM

## 2022-06-25 DIAGNOSIS — L97521 Non-pressure chronic ulcer of other part of left foot limited to breakdown of skin: Secondary | ICD-10-CM

## 2022-07-02 NOTE — Progress Notes (Signed)
Subjective: Chief Complaint  Patient presents with   Diabetic Ulcer    Left hallux Diabetic ulcer, No Drainage, A1c- 9.0 BG- 73    66 year old female presents above concerns.  She said that she is doing much better.  No swelling redness or any drainage.  She denies any fevers or chills.  She has no other concerns today.   Objective: AAO x3, NAD DP/PT pulses palpable bilaterally, CRT less than 3 seconds There is a callus formation present distal aspect of the left hallux.  Upon debridement appears that the wound is completely healed.  There is no edema, erythema, drainage or pus.  There is no fluctuation or crepitation but there is no malodor. No pain with calf compression, swelling, warmth, erythema     Assessment: Left hallux blister, infection-healed  Plan: -All treatment options discussed with the patient including all alternatives, risks, complications.  -Trimming the callus leading complications.  The wound appears to be healed.  I would continue offloading still and daily foot inspection.  There is new, healthy, pink skin present to protect this. -Daily foot inspection, glucose control  Return in about 9 weeks (around 08/27/2022).  Trula Slade DPM

## 2022-07-31 ENCOUNTER — Encounter (INDEPENDENT_AMBULATORY_CARE_PROVIDER_SITE_OTHER): Payer: Medicare Other | Admitting: Ophthalmology

## 2022-07-31 DIAGNOSIS — H43813 Vitreous degeneration, bilateral: Secondary | ICD-10-CM | POA: Diagnosis not present

## 2022-07-31 DIAGNOSIS — E113391 Type 2 diabetes mellitus with moderate nonproliferative diabetic retinopathy without macular edema, right eye: Secondary | ICD-10-CM | POA: Diagnosis not present

## 2022-07-31 DIAGNOSIS — E113312 Type 2 diabetes mellitus with moderate nonproliferative diabetic retinopathy with macular edema, left eye: Secondary | ICD-10-CM | POA: Diagnosis not present

## 2022-08-27 ENCOUNTER — Ambulatory Visit: Payer: Medicare Other | Admitting: Podiatry

## 2022-10-02 ENCOUNTER — Encounter (INDEPENDENT_AMBULATORY_CARE_PROVIDER_SITE_OTHER): Payer: Medicare Other | Admitting: Ophthalmology

## 2022-10-08 ENCOUNTER — Encounter (INDEPENDENT_AMBULATORY_CARE_PROVIDER_SITE_OTHER): Payer: Medicare Other | Admitting: Ophthalmology

## 2022-10-08 DIAGNOSIS — E113312 Type 2 diabetes mellitus with moderate nonproliferative diabetic retinopathy with macular edema, left eye: Secondary | ICD-10-CM

## 2022-10-08 DIAGNOSIS — E113391 Type 2 diabetes mellitus with moderate nonproliferative diabetic retinopathy without macular edema, right eye: Secondary | ICD-10-CM | POA: Diagnosis not present

## 2022-10-08 DIAGNOSIS — H43813 Vitreous degeneration, bilateral: Secondary | ICD-10-CM

## 2022-10-14 ENCOUNTER — Other Ambulatory Visit: Payer: Self-pay | Admitting: Podiatry

## 2022-10-15 ENCOUNTER — Ambulatory Visit (INDEPENDENT_AMBULATORY_CARE_PROVIDER_SITE_OTHER): Payer: Medicare Other | Admitting: Podiatry

## 2022-10-15 DIAGNOSIS — E1149 Type 2 diabetes mellitus with other diabetic neurological complication: Secondary | ICD-10-CM | POA: Diagnosis not present

## 2022-10-15 DIAGNOSIS — E119 Type 2 diabetes mellitus without complications: Secondary | ICD-10-CM

## 2022-10-15 MED ORDER — PREGABALIN 150 MG PO CAPS: 150.0000 mg | ORAL_CAPSULE | Freq: Two times a day (BID) | ORAL | 0 refills | Status: DC

## 2022-10-15 NOTE — Patient Instructions (Signed)
Pregabalin Capsules What is this medication? PREGABALIN (pre GAB a lin) treats nerve pain. It may also be used to prevent and control seizures in people with epilepsy. It works by calming overactive nerves in your body. This medicine may be used for other purposes; ask your health care provider or pharmacist if you have questions. COMMON BRAND NAME(S): Lyrica What should I tell my care team before I take this medication? They need to know if you have any of these conditions: Heart failure Kidney disease Lung disease Substance use disorder Suicidal thoughts, plans or attempt by you or a family member An unusual or allergic reaction to pregabalin, other medications, foods, dyes, or preservatives Pregnant or trying to get pregnant Breast-feeding How should I use this medication? Take this medication by mouth with water. Take it as directed on the prescription label at the same time every day. You can take it with or without food. If it upsets your stomach, take it with food. Keep taking it unless your care team tells you to stop. A special MedGuide will be given to you by the pharmacist with each prescription and refill. Be sure to read this information carefully each time. Talk to your care team about the use of this medication in children. While it may be prescribed for children as young as 1 month for selected conditions, precautions do apply. Overdosage: If you think you have taken too much of this medicine contact a poison control center or emergency room at once. NOTE: This medicine is only for you. Do not share this medicine with others. What if I miss a dose? If you miss a dose, take it as soon as you can. If it is almost time for your next dose, take only that dose. Do not take double or extra doses. What may interact with this medication? This medication may interact with the following: Alcohol Antihistamines for allergy, cough, and cold Certain medications for anxiety or  sleep Certain medications for blood pressure, heart disease Certain medications for depression like amitriptyline, fluoxetine, sertraline Certain medications for diabetes, like pioglitazone, rosiglitazone Certain medications for seizures like phenobarbital, primidone General anesthetics like halothane, isoflurane, methoxyflurane, propofol Medications that relax muscles for surgery Opioid medications for pain Phenothiazines like chlorpromazine, mesoridazine, prochlorperazine, thioridazine This list may not describe all possible interactions. Give your health care provider a list of all the medicines, herbs, non-prescription drugs, or dietary supplements you use. Also tell them if you smoke, drink alcohol, or use illegal drugs. Some items may interact with your medicine. What should I watch for while using this medication? Visit your care team for regular checks on your progress. Tell your care team if your symptoms do not start to get better or if they get worse. Do not suddenly stop taking this medication. You may develop a severe reaction. Your care team will tell you how much medication to take. If your care team wants you to stop the medication, the dose may be slowly lowered over time to avoid any side effects. This medication may affect your coordination, reaction time, or judgment. Do not drive or operate machinery until you know how this medication affects you. Sit up or stand slowly to reduce the risk of dizzy or fainting spells. Drinking alcohol with this medication can increase the risk of these side effects. If you or your family notice any changes in your behavior, such as new or worsening depression, thoughts of harming yourself, anxiety, other unusual or disturbing thoughts, or memory loss, call your  care team right away. Wear a medical ID bracelet or chain if you are taking this medication for seizures. Carry a card that describes your condition. List the medications and doses you take  on the card. This medication may make it more difficult to father a child. Talk to your care team if you are concerned about your fertility. What side effects may I notice from receiving this medication? Side effects that you should report to your care team as soon as possible: Allergic reactions or angioedema--skin rash, itching, hives, swelling of the face, eyes, lips, tongue, arms, or legs, trouble swallowing or breathing Blurry vision Thoughts of suicide or self-harm, worsening mood, feelings of depression Trouble breathing Side effects that usually do not require medical attention (report to your care team if they continue or are bothersome): Dizziness Drowsiness Dry mouth Nausea Swelling of the ankles, feet, hands Vomiting Weight gain This list may not describe all possible side effects. Call your doctor for medical advice about side effects. You may report side effects to FDA at 1-800-FDA-1088. Where should I keep my medication? Keep out of the reach of children and pets. This medication can be abused. Keep it in a safe place to protect it from theft. Do not share it with anyone. It is only for you. Selling or giving away this medication is dangerous and against the law. Store at [employer] [address] (77 degrees F). Get rid of any unused medication after the expiration date. This medication may cause harm and death if it is taken by other adults, children, or pets. It is important to get rid of the medication as soon as you no longer need it, or it is expired. You can do this in two ways: Take the medication to a medication take-back program. Check with your pharmacy or law enforcement to find a location. If you cannot return the medication, check the label or package insert to see if the medication should be thrown out in the garbage or flushed down the toilet. If you are not sure, ask your care team. If it is safe to put it in the trash, take the medication out of the container. Mix the  medication with cat litter, dirt, coffee grounds, or other unwanted substance. Seal the mixture in a bag or container. Put it in the trash. NOTE: This sheet is a summary. It may not cover all possible information. If you have questions about this medicine, talk to your doctor, pharmacist, or health care provider.  2023 Elsevier/Gold Standard (2020-09-05 00:00:00)

## 2022-10-20 NOTE — Progress Notes (Signed)
Subjective: Chief Complaint  Patient presents with   routine foot care     67 year old female presents above concerns and for diabetic foot exam.  She said that she is doing well.  She does see pedicures on a regular basis.  She has not seen any open lesions or wounds.  She has no concerns therapy today.    Objective: AAO x3, NAD DP/PT pulses palpable bilaterally, CRT less than 3 seconds There is no significant callus formation present left hallux today or other areas of the foot bilaterally.  Mildly elongated but she recently had a pedicure.  There is no edema, erythema or signs of infection.  There is mild incurvation of the hallux toenails.  No open lesions.  MMT 5/5.  No other areas of discomfort noted today. No pain with calf compression, swelling, warmth, erythema   Assessment: Diabetic foot exam  Plan: -All treatment options discussed with the patient including all alternatives, risks, complications.  -Overall doing well.  She recently had a pedicure.  Did debride a few of her nails are mildly elongated without any complications or bleeding as a courtesy. -Daily foot inspection, glucose control  Return in about 6 months (around 04/15/2023) for diabetic foot exam.  Trula Slade DPM

## 2022-12-04 ENCOUNTER — Other Ambulatory Visit: Payer: Self-pay | Admitting: Cardiovascular Disease

## 2022-12-04 ENCOUNTER — Ambulatory Visit
Admission: RE | Admit: 2022-12-04 | Discharge: 2022-12-04 | Disposition: A | Discharge status: Home / Self Care | Payer: Medicare Other | Source: Ambulatory Visit | Attending: Cardiovascular Disease | Admitting: Cardiovascular Disease

## 2022-12-04 DIAGNOSIS — R059 Cough, unspecified: Secondary | ICD-10-CM

## 2022-12-11 ENCOUNTER — Encounter (INDEPENDENT_AMBULATORY_CARE_PROVIDER_SITE_OTHER): Payer: Medicare Other | Admitting: Ophthalmology

## 2022-12-11 DIAGNOSIS — E113391 Type 2 diabetes mellitus with moderate nonproliferative diabetic retinopathy without macular edema, right eye: Secondary | ICD-10-CM | POA: Diagnosis not present

## 2022-12-11 DIAGNOSIS — E113312 Type 2 diabetes mellitus with moderate nonproliferative diabetic retinopathy with macular edema, left eye: Secondary | ICD-10-CM

## 2022-12-11 DIAGNOSIS — H43813 Vitreous degeneration, bilateral: Secondary | ICD-10-CM | POA: Diagnosis not present

## 2023-01-02 ENCOUNTER — Other Ambulatory Visit: Payer: Self-pay

## 2023-01-02 MED ORDER — HYDROCOD POLI-CHLORPHE POLI ER 10-8 MG/5ML PO SUER
5.0000 mL | Freq: Two times a day (BID) | ORAL | 0 refills | Status: DC
  Filled 2023-01-02: qty 70, 7d supply, fill #0

## 2023-01-16 ENCOUNTER — Ambulatory Visit
Admission: RE | Admit: 2023-01-16 | Discharge: 2023-01-16 | Disposition: A | Discharge status: Home / Self Care | Payer: Medicare Other | Source: Ambulatory Visit | Attending: Physician Assistant | Admitting: Physician Assistant

## 2023-01-16 ENCOUNTER — Other Ambulatory Visit: Payer: Self-pay | Admitting: Physician Assistant

## 2023-01-16 DIAGNOSIS — R042 Hemoptysis: Secondary | ICD-10-CM

## 2023-01-31 ENCOUNTER — Other Ambulatory Visit (HOSPITAL_BASED_OUTPATIENT_CLINIC_OR_DEPARTMENT_OTHER): Payer: Self-pay

## 2023-01-31 ENCOUNTER — Encounter (HOSPITAL_BASED_OUTPATIENT_CLINIC_OR_DEPARTMENT_OTHER): Payer: Self-pay | Admitting: Pulmonary Disease

## 2023-01-31 ENCOUNTER — Ambulatory Visit (HOSPITAL_BASED_OUTPATIENT_CLINIC_OR_DEPARTMENT_OTHER): Payer: Medicare Other | Admitting: Pulmonary Disease

## 2023-01-31 VITALS — BP 130/72 | HR 62 | Ht 60.0 in | Wt 165.0 lb

## 2023-01-31 DIAGNOSIS — R042 Hemoptysis: Secondary | ICD-10-CM

## 2023-01-31 DIAGNOSIS — J4541 Moderate persistent asthma with (acute) exacerbation: Secondary | ICD-10-CM

## 2023-01-31 DIAGNOSIS — J9859 Other diseases of mediastinum, not elsewhere classified: Secondary | ICD-10-CM | POA: Diagnosis not present

## 2023-01-31 MED ORDER — AZITHROMYCIN 250 MG PO TABS: ORAL_TABLET | ORAL | 0 refills | Status: DC

## 2023-01-31 MED ORDER — PREDNISONE 10 MG PO TABS: ORAL_TABLET | ORAL | 0 refills | Status: AC

## 2023-01-31 MED ORDER — HYDROCODONE BIT-HOMATROP MBR 5-1.5 MG/5ML PO SOLN
5.0000 mL | Freq: Two times a day (BID) | ORAL | 0 refills | Status: DC | PRN
  Filled 2023-01-31: qty 240, 24d supply, fill #0

## 2023-01-31 MED ORDER — FLUTICASONE FUROATE-VILANTEROL 100-25 MCG/ACT IN AEPB: 1.0000 | INHALATION_SPRAY | Freq: Every day | RESPIRATORY_TRACT | 5 refills | Status: DC
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
## 2023-01-31 NOTE — Patient Instructions (Addendum)
Zpak Prednisone 10 mg tabs  Take 2 tabs daily with food x 5ds, then 1 tab daily with food x 5ds then STOP Rx for Breo 100 -once daily , rinse mouth after use  COugh syrup Rx

## 2023-02-12 ENCOUNTER — Encounter (INDEPENDENT_AMBULATORY_CARE_PROVIDER_SITE_OTHER): Payer: Medicare Other | Admitting: Ophthalmology

## 2023-02-12 DIAGNOSIS — H43813 Vitreous degeneration, bilateral: Secondary | ICD-10-CM | POA: Diagnosis not present

## 2023-02-12 DIAGNOSIS — E113391 Type 2 diabetes mellitus with moderate nonproliferative diabetic retinopathy without macular edema, right eye: Secondary | ICD-10-CM

## 2023-02-12 DIAGNOSIS — Z794 Long term (current) use of insulin: Secondary | ICD-10-CM

## 2023-02-12 DIAGNOSIS — E113312 Type 2 diabetes mellitus with moderate nonproliferative diabetic retinopathy with macular edema, left eye: Secondary | ICD-10-CM

## 2023-02-27 ENCOUNTER — Ambulatory Visit (HOSPITAL_BASED_OUTPATIENT_CLINIC_OR_DEPARTMENT_OTHER): Admission: RE | Admit: 2023-02-27 | Payer: Medicare Other | Source: Ambulatory Visit

## 2023-03-06 ENCOUNTER — Encounter: Payer: Self-pay | Admitting: Adult Health

## 2023-03-06 ENCOUNTER — Ambulatory Visit: Payer: Medicare Other | Admitting: Adult Health

## 2023-03-06 VITALS — BP 112/68 | HR 62 | Temp 97.4°F | Ht 60.0 in | Wt 165.0 lb

## 2023-03-06 DIAGNOSIS — J4531 Mild persistent asthma with (acute) exacerbation: Secondary | ICD-10-CM

## 2023-03-06 DIAGNOSIS — J301 Allergic rhinitis due to pollen: Secondary | ICD-10-CM | POA: Diagnosis not present

## 2023-03-06 DIAGNOSIS — J45901 Unspecified asthma with (acute) exacerbation: Secondary | ICD-10-CM | POA: Insufficient documentation

## 2023-03-06 MED ORDER — HYDROCODONE BIT-HOMATROP MBR 5-1.5 MG/5ML PO SOLN: 5.0000 mL | Freq: Every evening | ORAL | 0 refills | Status: AC | PRN

## 2023-03-06 MED ORDER — BENZONATATE 200 MG PO CAPS: 200.0000 mg | ORAL_CAPSULE | Freq: Three times a day (TID) | ORAL | 1 refills | Status: AC | PRN

## 2023-03-06 MED ORDER — BUDESONIDE-FORMOTEROL FUMARATE 80-4.5 MCG/ACT IN AERO: 2.0000 | INHALATION_SPRAY | Freq: Two times a day (BID) | RESPIRATORY_TRACT | 5 refills | Status: DC

## 2023-03-06 NOTE — Patient Instructions (Addendum)
Stop BREO , begin Symbicort 2 puffs Twice daily , rinse after use.  Continue on Zyrtec and Nasacort  Delsym 2 tsp Twice daily for cough As needed   Tessalon Three times a day  As needed   Hydromet As needed  for severe cough  Continue on Prilosec daily.  CT chest as planned  Follow up with Dr. Vassie Loll  in 6 weeks with PFT and As needed   Please contact office for sooner follow up if symptoms do not improve or worsen or seek emergency care

## 2023-03-06 NOTE — Assessment & Plan Note (Signed)
Slow to resolve asthmatic bronchitic exacerbation.-Symptoms are not proving after antibiotics and steroids.  Will change from dry powder inhaler over to MDI.  Check PFTs on return.  CT chest is pending.  Control for triggers and along with cough suppression regimen.  Patient education given on cough syrup and sedating medications  Plan  Patient Instructions  Stop BREO , begin Symbicort 2 puffs Twice daily , rinse after use.  Continue on Zyrtec and Nasacort  Delsym 2 tsp Twice daily for cough As needed   Tessalon Three times a day  As needed   Hydromet As needed  for severe cough  Continue on Prilosec daily.  CT chest as planned  Follow up with Dr. Vassie Loll  in 6 weeks with PFT and As needed   Please contact office for sooner follow up if symptoms do not improve or worsen or seek emergency care       '
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
## 2023-03-06 NOTE — Assessment & Plan Note (Signed)
Continue on current regimen .   

## 2023-04-03 ENCOUNTER — Ambulatory Visit (HOSPITAL_BASED_OUTPATIENT_CLINIC_OR_DEPARTMENT_OTHER)
Admission: RE | Admit: 2023-04-03 | Discharge: 2023-04-03 | Disposition: A | Discharge status: Home / Self Care | Payer: Medicare Other | Source: Ambulatory Visit | Attending: Pulmonary Disease | Admitting: Pulmonary Disease

## 2023-04-03 DIAGNOSIS — R042 Hemoptysis: Secondary | ICD-10-CM | POA: Diagnosis not present

## 2023-04-03 LAB — POCT I-STAT CREATININE: Creatinine, Ser: 1.6 mg/dL — ABNORMAL HIGH (ref 0.44–1.00)

## 2023-04-03 MED ORDER — IOHEXOL 300 MG/ML  SOLN
100.0000 mL | Freq: Once | INTRAMUSCULAR | Status: AC | PRN
  Administered 2023-04-03: 60 mL via INTRAVENOUS

## 2023-04-07 NOTE — Progress Notes (Signed)
ATC patient x1 regarding her lab work.  LVM to return call.

## 2023-04-11 NOTE — Progress Notes (Signed)
Spoke with pt and notified of results per Dr. Alva. Pt verbalized understanding and denied any questions. 

## 2023-04-15 ENCOUNTER — Ambulatory Visit: Payer: Medicare Other | Admitting: Podiatry

## 2023-04-15 DIAGNOSIS — M79675 Pain in left toe(s): Secondary | ICD-10-CM | POA: Diagnosis not present

## 2023-04-15 DIAGNOSIS — B351 Tinea unguium: Secondary | ICD-10-CM | POA: Diagnosis not present

## 2023-04-15 DIAGNOSIS — M79674 Pain in right toe(s): Secondary | ICD-10-CM

## 2023-04-15 DIAGNOSIS — E1149 Type 2 diabetes mellitus with other diabetic neurological complication: Secondary | ICD-10-CM | POA: Diagnosis not present

## 2023-04-15 MED ORDER — PREGABALIN 150 MG PO CAPS: 150.0000 mg | ORAL_CAPSULE | Freq: Two times a day (BID) | ORAL | 0 refills | Status: DC

## 2023-04-16 ENCOUNTER — Encounter (INDEPENDENT_AMBULATORY_CARE_PROVIDER_SITE_OTHER): Payer: Medicare Other | Admitting: Ophthalmology

## 2023-04-16 DIAGNOSIS — Z794 Long term (current) use of insulin: Secondary | ICD-10-CM | POA: Diagnosis not present

## 2023-04-16 DIAGNOSIS — E113312 Type 2 diabetes mellitus with moderate nonproliferative diabetic retinopathy with macular edema, left eye: Secondary | ICD-10-CM | POA: Diagnosis not present

## 2023-04-16 DIAGNOSIS — H43813 Vitreous degeneration, bilateral: Secondary | ICD-10-CM

## 2023-04-16 DIAGNOSIS — E113391 Type 2 diabetes mellitus with moderate nonproliferative diabetic retinopathy without macular edema, right eye: Secondary | ICD-10-CM

## 2023-04-17 NOTE — Progress Notes (Signed)
Called and spoke with patient, advised of results/recommendations.  She verbalized understanding.  She states that her PCP and diabetic doctor check on her kidney function regularly and she has a follow up scheduled.  Nothing further needed.

## 2023-04-20 NOTE — Progress Notes (Signed)
Subjective: No chief complaint on file.   67 year old female presents above concerns and for diabetic foot exam.  States has been doing well.  She does need the nails trimmed today as she is not able to the pedicure they do cause discomfort.  No swelling redness or drainage.  She has a refill of the Lyrica as well.  No new concerns.   Objective: AAO x3, NAD DP/PT pulses palpable bilaterally, CRT less than 3 seconds Nails are hypertrophic, dystrophic, brittle, discolored, elongated 10. No surrounding redness or drainage. Tenderness nails 1-5 bilaterally. No open lesions or pre-ulcerative lesions are identified today. No open lesions.  No other areas of tenderness. No pain with calf compression, swelling, warmth, erythema   Assessment: Diabetic foot exam; symptomatic onychomycosis  Plan: -All treatment options discussed with the patient including all alternatives, risks, complications.  -Sharply debrided nails x 10 without any complications or bleeding. -Refilled Lyrica. -Daily foot inspection, glucose control  Return in about 6 months (around 10/16/2023).  Vivi Barrack DPM

## 2023-05-13 ENCOUNTER — Encounter (HOSPITAL_BASED_OUTPATIENT_CLINIC_OR_DEPARTMENT_OTHER): Payer: BC Managed Care – PPO

## 2023-05-13 ENCOUNTER — Encounter (HOSPITAL_BASED_OUTPATIENT_CLINIC_OR_DEPARTMENT_OTHER): Payer: Self-pay | Admitting: Pulmonary Disease

## 2023-05-13 ENCOUNTER — Ambulatory Visit (HOSPITAL_BASED_OUTPATIENT_CLINIC_OR_DEPARTMENT_OTHER): Payer: Medicare Other | Admitting: Pulmonary Disease

## 2023-05-13 VITALS — BP 116/70 | HR 68 | Resp 14 | Ht 60.0 in | Wt 162.0 lb

## 2023-05-13 DIAGNOSIS — J9859 Other diseases of mediastinum, not elsewhere classified: Secondary | ICD-10-CM

## 2023-05-13 DIAGNOSIS — J4531 Mild persistent asthma with (acute) exacerbation: Secondary | ICD-10-CM

## 2023-05-13 NOTE — Patient Instructions (Signed)
°  Continue on symbicort
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
## 2023-05-13 NOTE — Assessment & Plan Note (Signed)
We will continue Symbicort since she has had good results with this through fall and then attempt stepdown therapy. She will use albuterol for rescue

## 2023-05-13 NOTE — Assessment & Plan Note (Signed)
Does not appear to be present on repeat CT chest

## 2023-06-06 ENCOUNTER — Other Ambulatory Visit: Payer: Self-pay | Admitting: Physician Assistant

## 2023-06-06 DIAGNOSIS — Z780 Asymptomatic menopausal state: Secondary | ICD-10-CM

## 2023-06-11 ENCOUNTER — Encounter (INDEPENDENT_AMBULATORY_CARE_PROVIDER_SITE_OTHER): Payer: Medicare Other | Admitting: Ophthalmology

## 2023-06-11 DIAGNOSIS — E113391 Type 2 diabetes mellitus with moderate nonproliferative diabetic retinopathy without macular edema, right eye: Secondary | ICD-10-CM

## 2023-06-11 DIAGNOSIS — Z794 Long term (current) use of insulin: Secondary | ICD-10-CM

## 2023-06-11 DIAGNOSIS — H43813 Vitreous degeneration, bilateral: Secondary | ICD-10-CM

## 2023-06-11 DIAGNOSIS — E113312 Type 2 diabetes mellitus with moderate nonproliferative diabetic retinopathy with macular edema, left eye: Secondary | ICD-10-CM

## 2023-06-17 ENCOUNTER — Other Ambulatory Visit: Payer: Self-pay | Admitting: Nephrology

## 2023-06-17 DIAGNOSIS — E875 Hyperkalemia: Secondary | ICD-10-CM

## 2023-06-17 DIAGNOSIS — N1832 Chronic kidney disease, stage 3b: Secondary | ICD-10-CM

## 2023-08-06 ENCOUNTER — Encounter (INDEPENDENT_AMBULATORY_CARE_PROVIDER_SITE_OTHER): Payer: Medicare Other | Admitting: Ophthalmology

## 2023-08-06 DIAGNOSIS — E113312 Type 2 diabetes mellitus with moderate nonproliferative diabetic retinopathy with macular edema, left eye: Secondary | ICD-10-CM

## 2023-08-06 DIAGNOSIS — H43813 Vitreous degeneration, bilateral: Secondary | ICD-10-CM | POA: Diagnosis not present

## 2023-08-06 DIAGNOSIS — E113391 Type 2 diabetes mellitus with moderate nonproliferative diabetic retinopathy without macular edema, right eye: Secondary | ICD-10-CM

## 2023-08-06 DIAGNOSIS — Z794 Long term (current) use of insulin: Secondary | ICD-10-CM

## 2023-09-15 ENCOUNTER — Other Ambulatory Visit: Payer: Self-pay | Admitting: Podiatry

## 2023-10-01 ENCOUNTER — Encounter (INDEPENDENT_AMBULATORY_CARE_PROVIDER_SITE_OTHER): Payer: Medicare PPO | Admitting: Ophthalmology

## 2023-10-01 DIAGNOSIS — H43813 Vitreous degeneration, bilateral: Secondary | ICD-10-CM

## 2023-10-01 DIAGNOSIS — E113313 Type 2 diabetes mellitus with moderate nonproliferative diabetic retinopathy with macular edema, bilateral: Secondary | ICD-10-CM

## 2023-10-01 DIAGNOSIS — Z794 Long term (current) use of insulin: Secondary | ICD-10-CM

## 2023-10-17 ENCOUNTER — Ambulatory Visit (INDEPENDENT_AMBULATORY_CARE_PROVIDER_SITE_OTHER): Payer: Medicare PPO | Admitting: Podiatry

## 2023-10-17 DIAGNOSIS — E1149 Type 2 diabetes mellitus with other diabetic neurological complication: Secondary | ICD-10-CM

## 2023-10-17 DIAGNOSIS — B351 Tinea unguium: Secondary | ICD-10-CM

## 2023-10-17 DIAGNOSIS — M79675 Pain in left toe(s): Secondary | ICD-10-CM

## 2023-10-17 DIAGNOSIS — M79674 Pain in right toe(s): Secondary | ICD-10-CM

## 2023-10-22 NOTE — Progress Notes (Signed)
Subjective: No chief complaint on file.   68 year old female presents above concerns and for diabetic foot exam.  She is overall she is doing well and this is the best her feet have looked in a while.  Does not report any open lesions.  No ulcerations.  No fevers or chills.  States her blood sugars been doing better she has been losing weight.  Lyrica has been helpful.  Objective: AAO x3, NAD DP/PT pulses palpable bilaterally, CRT less than 3 seconds Nails are hypertrophic, dystrophic, brittle, discolored, elongated 10. No surrounding redness or drainage. Tenderness nails 1-5 bilaterally at the become elongated. No open lesions or pre-ulcerative lesions are identified today. No open lesions.  No other areas of tenderness. No pain with calf compression, swelling, warmth, erythema   Assessment: Diabetic foot exam; symptomatic onychomycosis  Plan: -All treatment options discussed with the patient including all alternatives, risks, complications.  -Sharply debrided nails x 10 without any complications or bleeding. -Continue Lyrica. -Daily foot inspection, glucose control  Return in about 3 months (around 01/15/2024).  Vivi Barrack DPM

## 2023-11-11 ENCOUNTER — Telehealth: Payer: Self-pay

## 2023-11-11 NOTE — Telephone Encounter (Signed)
 Left VM to reschedule due to weather

## 2023-11-12 ENCOUNTER — Other Ambulatory Visit: Payer: PRIVATE HEALTH INSURANCE

## 2023-11-26 ENCOUNTER — Encounter (INDEPENDENT_AMBULATORY_CARE_PROVIDER_SITE_OTHER): Payer: No Typology Code available for payment source | Admitting: Ophthalmology

## 2023-11-26 DIAGNOSIS — Z794 Long term (current) use of insulin: Secondary | ICD-10-CM

## 2023-11-26 DIAGNOSIS — E113391 Type 2 diabetes mellitus with moderate nonproliferative diabetic retinopathy without macular edema, right eye: Secondary | ICD-10-CM | POA: Diagnosis not present

## 2023-11-26 DIAGNOSIS — E113312 Type 2 diabetes mellitus with moderate nonproliferative diabetic retinopathy with macular edema, left eye: Secondary | ICD-10-CM

## 2023-11-26 DIAGNOSIS — H43813 Vitreous degeneration, bilateral: Secondary | ICD-10-CM

## 2023-12-05 ENCOUNTER — Other Ambulatory Visit: Payer: Self-pay | Admitting: Nephrology

## 2023-12-05 DIAGNOSIS — N1831 Chronic kidney disease, stage 3a: Secondary | ICD-10-CM

## 2023-12-17 ENCOUNTER — Other Ambulatory Visit: Payer: PRIVATE HEALTH INSURANCE

## 2023-12-27 ENCOUNTER — Other Ambulatory Visit: Payer: Self-pay | Admitting: Podiatry

## 2024-01-16 ENCOUNTER — Ambulatory Visit: Payer: PRIVATE HEALTH INSURANCE | Admitting: Podiatry

## 2024-01-19 ENCOUNTER — Ambulatory Visit (INDEPENDENT_AMBULATORY_CARE_PROVIDER_SITE_OTHER): Admitting: Podiatry

## 2024-01-19 DIAGNOSIS — E1149 Type 2 diabetes mellitus with other diabetic neurological complication: Secondary | ICD-10-CM

## 2024-01-19 DIAGNOSIS — M79675 Pain in left toe(s): Secondary | ICD-10-CM

## 2024-01-19 DIAGNOSIS — B351 Tinea unguium: Secondary | ICD-10-CM

## 2024-01-19 DIAGNOSIS — M79674 Pain in right toe(s): Secondary | ICD-10-CM

## 2024-01-21 ENCOUNTER — Encounter (INDEPENDENT_AMBULATORY_CARE_PROVIDER_SITE_OTHER): Admitting: Ophthalmology

## 2024-01-21 DIAGNOSIS — E113313 Type 2 diabetes mellitus with moderate nonproliferative diabetic retinopathy with macular edema, bilateral: Secondary | ICD-10-CM | POA: Diagnosis not present

## 2024-01-21 DIAGNOSIS — Z794 Long term (current) use of insulin: Secondary | ICD-10-CM

## 2024-01-21 DIAGNOSIS — H43813 Vitreous degeneration, bilateral: Secondary | ICD-10-CM

## 2024-01-21 NOTE — Progress Notes (Signed)
 Subjective: Chief Complaint  Patient presents with   [facility]    RM#[id number] DFC    68 year old female presents above concerns and for diabetic foot exam, as well as for nail trim as they are elongated she has difficulty doing them and she has a history of wounds.  She is not reporting her blood sugars been much better controlled since she has been losing weight and exercising more.   Objective: AAO x3, NAD DP/PT pulses palpable bilaterally, CRT less than 3 seconds Nails are hypertrophic, dystrophic, brittle, discolored, elongated 10. No surrounding redness or drainage. Tenderness nails 1-5 bilaterally at the become elongated. No open lesions or pre-ulcerative lesions are identified today. No open lesions.  No other areas of tenderness. No pain with calf compression, swelling, warmth, erythema  Assessment: Diabetic foot exam; symptomatic onychomycosis  Plan: -All treatment options discussed with the patient including all alternatives, risks, complications.  -Sharply debrided nails x 10 without any complications or bleeding. -Continue Lyrica . -Daily foot inspection, glucose control  Return in about 3 months (around 04/19/2024).  Charity Conch DPM

## 2024-01-30 ENCOUNTER — Ambulatory Visit: Payer: PRIVATE HEALTH INSURANCE

## 2024-01-30 NOTE — Progress Notes (Signed)
 Orthotics   Patient was present and evaluated for Custom molded foot orthotics. Patient will benefit from CFO's to provide total contact to BIL MLA's helping to balance and distribute body weight more evenly across BIL feet helping to reduce plantar pressure and pain. Orthotic will also encourage FF / RF alignment  Patient was scanned today and will return for fitting upon receipt

## 2024-02-11 ENCOUNTER — Ambulatory Visit (HOSPITAL_BASED_OUTPATIENT_CLINIC_OR_DEPARTMENT_OTHER): Admitting: Pulmonary Disease

## 2024-03-17 ENCOUNTER — Encounter (INDEPENDENT_AMBULATORY_CARE_PROVIDER_SITE_OTHER): Admitting: Ophthalmology

## 2024-03-17 DIAGNOSIS — H43813 Vitreous degeneration, bilateral: Secondary | ICD-10-CM

## 2024-03-17 DIAGNOSIS — E113313 Type 2 diabetes mellitus with moderate nonproliferative diabetic retinopathy with macular edema, bilateral: Secondary | ICD-10-CM

## 2024-03-17 DIAGNOSIS — Z794 Long term (current) use of insulin: Secondary | ICD-10-CM | POA: Diagnosis not present

## 2024-03-22 ENCOUNTER — Ambulatory Visit (INDEPENDENT_AMBULATORY_CARE_PROVIDER_SITE_OTHER): Admitting: Pulmonary Disease

## 2024-03-22 ENCOUNTER — Encounter (HOSPITAL_BASED_OUTPATIENT_CLINIC_OR_DEPARTMENT_OTHER): Payer: Self-pay | Admitting: Pulmonary Disease

## 2024-03-22 VITALS — BP 132/89 | HR 79 | Ht 60.0 in | Wt 131.0 lb

## 2024-03-22 DIAGNOSIS — Z87891 Personal history of nicotine dependence: Secondary | ICD-10-CM

## 2024-03-22 DIAGNOSIS — J4520 Mild intermittent asthma, uncomplicated: Secondary | ICD-10-CM | POA: Diagnosis not present

## 2024-03-22 MED ORDER — LEVALBUTEROL TARTRATE 45 MCG/ACT IN AERO: 1.0000 | INHALATION_SPRAY | Freq: Four times a day (QID) | RESPIRATORY_TRACT | 11 refills | Status: AC | PRN

## 2024-03-22 MED ORDER — BUDESONIDE-FORMOTEROL FUMARATE 80-4.5 MCG/ACT IN AERO: 2.0000 | INHALATION_SPRAY | Freq: Two times a day (BID) | RESPIRATORY_TRACT | 11 refills | Status: AC
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
## 2024-03-22 NOTE — Patient Instructions (Signed)
 Refills on symbicort  & xopenex

## 2024-03-29 ENCOUNTER — Ambulatory Visit (INDEPENDENT_AMBULATORY_CARE_PROVIDER_SITE_OTHER)

## 2024-03-29 DIAGNOSIS — M2141 Flat foot [pes planus] (acquired), right foot: Secondary | ICD-10-CM

## 2024-03-29 DIAGNOSIS — M2142 Flat foot [pes planus] (acquired), left foot: Secondary | ICD-10-CM

## 2024-03-29 DIAGNOSIS — M79675 Pain in left toe(s): Secondary | ICD-10-CM | POA: Diagnosis not present

## 2024-03-29 DIAGNOSIS — M79674 Pain in right toe(s): Secondary | ICD-10-CM | POA: Diagnosis not present

## 2024-03-29 NOTE — Progress Notes (Signed)
 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   Orthotics were dispensed and fit was satisfactory. Reviewed instructions for break-in and wear. Written instructions given to patient.  Patient will follow up as needed.   Lolita Schultze Cped, CFo, CFm

## 2024-04-12 ENCOUNTER — Other Ambulatory Visit: Payer: Self-pay | Admitting: Podiatry

## 2024-04-12 MED ORDER — PREGABALIN 150 MG PO CAPS: 150.0000 mg | ORAL_CAPSULE | Freq: Two times a day (BID) | ORAL | 0 refills | Status: AC

## 2024-04-12 NOTE — Addendum Note (Signed)
 Addended by: HEROLD MOUND E on: 04/12/2024 04:19 PM   Modules accepted: Orders

## 2024-04-12 NOTE — Addendum Note (Signed)
 Addended by: Halimah Bewick on: 04/12/2024 04:22 PM   Modules accepted: Orders

## 2024-04-19 ENCOUNTER — Ambulatory Visit (INDEPENDENT_AMBULATORY_CARE_PROVIDER_SITE_OTHER): Admitting: Podiatry

## 2024-04-19 DIAGNOSIS — B351 Tinea unguium: Secondary | ICD-10-CM | POA: Diagnosis not present

## 2024-04-19 DIAGNOSIS — M79674 Pain in right toe(s): Secondary | ICD-10-CM | POA: Diagnosis not present

## 2024-04-19 DIAGNOSIS — E1149 Type 2 diabetes mellitus with other diabetic neurological complication: Secondary | ICD-10-CM | POA: Diagnosis not present

## 2024-04-19 DIAGNOSIS — M79675 Pain in left toe(s): Secondary | ICD-10-CM | POA: Diagnosis not present

## 2024-04-21 NOTE — Progress Notes (Signed)
 Subjective: Chief Complaint  Patient presents with   Nail Problem    Pt is here to get her nails trimmed      68 year old female presents above concerns and for diabetic foot exam, as well as for nail trim as they are elongated she has difficulty doing them.  She denies any ulcerations.  She has no new concerns today.  Objective: AAO x3, NAD DP/PT pulses palpable bilaterally, CRT less than 3 seconds Nails are hypertrophic, dystrophic, brittle, discolored, elongated 10. No surrounding redness or drainage. Tenderness nails 1-5 bilaterally at the become elongated. No open lesions or pre-ulcerative lesions are identified today. No open lesions.  No other areas of tenderness. No pain with calf compression, swelling, warmth, erythema  Assessment: Symptomatic onychomycosis  Plan: -All treatment options discussed with the patient including all alternatives, risks, complications.  -Sharply debrided nails x 10 without any complications or bleeding. -Continue Lyrica  for neuropathy -Daily foot inspection, glucose control  Return in about 3 months (around 07/20/2024).  [name] DPM

## 2024-05-12 ENCOUNTER — Encounter (INDEPENDENT_AMBULATORY_CARE_PROVIDER_SITE_OTHER): Admitting: Ophthalmology

## 2024-05-12 DIAGNOSIS — Z794 Long term (current) use of insulin: Secondary | ICD-10-CM | POA: Diagnosis not present

## 2024-05-12 DIAGNOSIS — H43813 Vitreous degeneration, bilateral: Secondary | ICD-10-CM

## 2024-05-12 DIAGNOSIS — E113313 Type 2 diabetes mellitus with moderate nonproliferative diabetic retinopathy with macular edema, bilateral: Secondary | ICD-10-CM | POA: Diagnosis not present

## 2024-06-30 ENCOUNTER — Encounter (INDEPENDENT_AMBULATORY_CARE_PROVIDER_SITE_OTHER): Admitting: Ophthalmology

## 2024-07-22 ENCOUNTER — Ambulatory Visit: Admitting: Podiatry

## 2024-07-22 ENCOUNTER — Encounter: Payer: Self-pay | Admitting: Podiatry

## 2024-07-22 DIAGNOSIS — E1149 Type 2 diabetes mellitus with other diabetic neurological complication: Secondary | ICD-10-CM

## 2024-07-22 DIAGNOSIS — M79674 Pain in right toe(s): Secondary | ICD-10-CM | POA: Diagnosis not present

## 2024-07-22 DIAGNOSIS — B351 Tinea unguium: Secondary | ICD-10-CM

## 2024-07-22 DIAGNOSIS — M79675 Pain in left toe(s): Secondary | ICD-10-CM | POA: Diagnosis not present

## 2024-07-22 NOTE — Progress Notes (Signed)
 Subjective: Chief Complaint  Patient presents with   Nail Problem    Patient is here for routine [name]    68 year old female presents above concerns and for diabetic foot exam, as well as for nail trim as they are elongated she has difficulty doing them.  She has not noticed any ulcerations or injuries and she has no other concerns.  Objective: AAO x3, NAD DP/PT pulses palpable bilaterally, CRT less than 3 seconds Nails are hypertrophic, dystrophic, brittle, discolored, elongated 10. No surrounding redness or drainage. Tenderness nails 1-5 bilaterally at the become elongated. No open lesions or pre-ulcerative lesions are identified today. No open lesions.  No other areas of tenderness. No pain with calf compression, swelling, warmth, erythema  Assessment: Symptomatic onychomycosis  Plan: -All treatment options discussed with the patient including all alternatives, risks, complications.  -Sharply debrided nails x 10 without any complications or bleeding. -Continue Lyrica  for neuropathy -Daily foot inspection, glucose control  Return in about 3 months (around 10/22/2024).  [name] DPM

## 2024-08-04 ENCOUNTER — Encounter (INDEPENDENT_AMBULATORY_CARE_PROVIDER_SITE_OTHER): Admitting: Ophthalmology

## 2024-08-04 DIAGNOSIS — E113313 Type 2 diabetes mellitus with moderate nonproliferative diabetic retinopathy with macular edema, bilateral: Secondary | ICD-10-CM | POA: Diagnosis not present

## 2024-08-04 DIAGNOSIS — Z794 Long term (current) use of insulin: Secondary | ICD-10-CM

## 2024-08-04 DIAGNOSIS — H43813 Vitreous degeneration, bilateral: Secondary | ICD-10-CM | POA: Diagnosis not present

## 2024-09-08 ENCOUNTER — Encounter (INDEPENDENT_AMBULATORY_CARE_PROVIDER_SITE_OTHER): Admitting: Ophthalmology

## 2024-09-13 ENCOUNTER — Encounter (INDEPENDENT_AMBULATORY_CARE_PROVIDER_SITE_OTHER): Admitting: Ophthalmology

## 2024-09-27 ENCOUNTER — Encounter (INDEPENDENT_AMBULATORY_CARE_PROVIDER_SITE_OTHER): Admitting: Ophthalmology

## 2024-10-22 ENCOUNTER — Ambulatory Visit: Admitting: Podiatry

## 2024-10-22 DIAGNOSIS — E1149 Type 2 diabetes mellitus with other diabetic neurological complication: Secondary | ICD-10-CM | POA: Diagnosis not present

## 2024-10-22 DIAGNOSIS — M79675 Pain in left toe(s): Secondary | ICD-10-CM | POA: Diagnosis not present

## 2024-10-22 DIAGNOSIS — M79674 Pain in right toe(s): Secondary | ICD-10-CM | POA: Diagnosis not present

## 2024-10-22 DIAGNOSIS — B351 Tinea unguium: Secondary | ICD-10-CM

## 2024-10-22 NOTE — Patient Instructions (Signed)
 Diabetes and Foot Care: What to Know Diabetes, also called diabetes mellitus, may cause problems with your feet and legs because of poor blood flow (circulation). Poor circulation may make your skin: Become thinner and drier. Break more easily. Heal more slowly. Peel and crack. You may also have nerve damage (neuropathy). This can cause decreased feeling in your legs and feet. This means that you may not notice minor injuries to your feet that could lead to more serious problems. Finding and treating problems early is the best way to prevent future foot problems. How to care for your feet Foot hygiene  Wash your feet daily with warm water  and mild soap. Do not use hot water . Then, pat your feet and the areas between your toes until they are fully dry. Do not soak your feet. This can dry your skin. Trim your toenails straight across. Do not dig under them or around the cuticle. File the edges of your nails with an emery board or nail file. Apply a moisturizing lotion or petroleum jelly to the skin on your feet and to dry, brittle toenails. Use lotion that does not contain alcohol and is unscented. Do not apply lotion between your toes. Shoes and socks Wear clean socks or stockings every day. Make sure they are not too tight. Do not wear knee-high stockings. These may decrease blood flow to your legs. Wear shoes that fit well and have enough cushioning. Always look in your shoes before you put them on to be sure there are no objects inside. To break in new shoes, wear them for just a few hours a day. This prevents injuries on your feet. Wounds, scrapes, corns, and calluses  Check your feet daily for blisters, cuts, bruises, sores, and redness. If you cannot see the bottom of your feet, use a mirror or ask someone for help. Do not cut off corns or calluses or try to remove them with medicine. If you find a minor scrape, cut, or break in the skin on your feet, keep it and the skin around it clean  and dry. You may clean these areas with mild soap and water . Do not clean the area with peroxide, alcohol, or iodine . If you have a wound, scrape, corn, or callus on your foot, look at it several times a day to make sure it is healing and not infected. Check for: Redness, swelling, or pain. Fluid or blood. Warmth. Pus or a bad smell. General tips Do not cross your legs. This may decrease blood flow to your feet. Do not use heating pads or hot water  bottles on your feet. They may burn your skin. If you have lost feeling in your feet or legs, you may not know this is happening until it is too late. Protect your feet from hot and cold by wearing shoes, such as at the beach or on hot pavement. Schedule a complete foot exam at least once a year or more often if you have foot problems. Report any cuts, sores, or bruises to your health care provider right away. Where to find more information American Diabetes Association: diabetes.org Association of Diabetes Care & Education Specialists: diabeteseducator.org Contact a health care provider if: You have a condition that increases your risk of infection, and you have any cuts, sores, or bruises on your feet. You have an injury that is not healing. You have redness on your legs or feet. You feel burning or tingling in your legs or feet. You have pain or cramps in  your legs and feet. Your legs or feet are numb. Your feet always feel cold. You have pain around any toenails. Get help right away if: You have a wound, scrape, corn, or callus on your foot and: You have signs of infection. You have a fever. You have a red line going up your leg. This information is not intended to replace advice given to you by your health care provider. Make sure you discuss any questions you have with your health care provider. Document Revised: 07/16/2024 Document Reviewed: 03/13/2022 Elsevier Patient Education  2025 [employer].

## 2024-10-22 NOTE — Progress Notes (Signed)
 Subjective: Chief Complaint  Patient presents with   [name]    Last A1c: 6.3, takes Eliquis . Needs nails trimmed.     69 year old female presents above concerns and for diabetic foot exam, as well as for nail trim as they are elongated she has difficulty doing them herself.  Does not report any open lesions or any signs of infection.  She has no other concerns today.  Objective: AAO x3, NAD DP/PT pulses palpable bilaterally, CRT less than 3 seconds Sensation decreased with Semmes Weinstein monofilament. Nails are hypertrophic, dystrophic, brittle, discolored, elongated 10. No surrounding redness or drainage. Tenderness nails 1-5 bilaterally at the become elongated. No open lesions or pre-ulcerative lesions are identified today. No open lesions.  No other areas of tenderness. No pain with calf compression, swelling, warmth, erythema  Assessment: Symptomatic onychomycosis  Plan: -All treatment options discussed with the patient including all alternatives, risks, complications.  -Sharply debrided nails x 10 without any complications or bleeding. -Continue Lyrica  for neuropathy -Daily foot inspection, glucose control  Return in about 3 months (around 01/20/2025).  [name] DPM

## 2025-01-20 ENCOUNTER — Ambulatory Visit: Admitting: Podiatry
